# Patient Record
Sex: Female | Born: 1945 | Race: Black or African American | Hispanic: No | Marital: Married | Smoking: Never smoker
Health system: Southern US, Community
[De-identification: ages and names within clinical notes are randomized; demographics above are authoritative.]

## PROBLEM LIST (undated history)

## (undated) DIAGNOSIS — I2510 Atherosclerotic heart disease of native coronary artery without angina pectoris: Secondary | ICD-10-CM

## (undated) DIAGNOSIS — H3500 Unspecified background retinopathy: Secondary | ICD-10-CM

## (undated) DIAGNOSIS — K219 Gastro-esophageal reflux disease without esophagitis: Secondary | ICD-10-CM

## (undated) DIAGNOSIS — G629 Polyneuropathy, unspecified: Secondary | ICD-10-CM

## (undated) DIAGNOSIS — R490 Dysphonia: Secondary | ICD-10-CM

## (undated) DIAGNOSIS — D1803 Hemangioma of intra-abdominal structures: Secondary | ICD-10-CM

## (undated) DIAGNOSIS — M86171 Other acute osteomyelitis, right ankle and foot: Secondary | ICD-10-CM

## (undated) DIAGNOSIS — D649 Anemia, unspecified: Secondary | ICD-10-CM

## (undated) DIAGNOSIS — E119 Type 2 diabetes mellitus without complications: Secondary | ICD-10-CM

## (undated) DIAGNOSIS — N189 Chronic kidney disease, unspecified: Secondary | ICD-10-CM

## (undated) DIAGNOSIS — I6529 Occlusion and stenosis of unspecified carotid artery: Secondary | ICD-10-CM

## (undated) DIAGNOSIS — I209 Angina pectoris, unspecified: Secondary | ICD-10-CM

## (undated) DIAGNOSIS — I10 Essential (primary) hypertension: Secondary | ICD-10-CM

## (undated) DIAGNOSIS — E785 Hyperlipidemia, unspecified: Secondary | ICD-10-CM

## (undated) DIAGNOSIS — I639 Cerebral infarction, unspecified: Secondary | ICD-10-CM

## (undated) DIAGNOSIS — M1990 Unspecified osteoarthritis, unspecified site: Secondary | ICD-10-CM

## (undated) DIAGNOSIS — R634 Abnormal weight loss: Secondary | ICD-10-CM

## (undated) DIAGNOSIS — I509 Heart failure, unspecified: Secondary | ICD-10-CM

## (undated) DIAGNOSIS — I429 Cardiomyopathy, unspecified: Secondary | ICD-10-CM

## (undated) DIAGNOSIS — M51369 Other intervertebral disc degeneration, lumbar region without mention of lumbar back pain or lower extremity pain: Secondary | ICD-10-CM

## (undated) DIAGNOSIS — M5136 Other intervertebral disc degeneration, lumbar region: Secondary | ICD-10-CM

## (undated) DIAGNOSIS — H409 Unspecified glaucoma: Secondary | ICD-10-CM

## (undated) HISTORY — DX: Hyperlipidemia, unspecified: E78.5

## (undated) HISTORY — DX: Anemia, unspecified: D64.9

## (undated) HISTORY — PX: EYE SURGERY: SHX253

## (undated) HISTORY — PX: BRAIN SURGERY: SHX531

## (undated) HISTORY — PX: BILROTH II PROCEDURE: SHX1232

## (undated) HISTORY — PX: CARDIAC CATHETERIZATION: SHX172

## (undated) HISTORY — DX: Atherosclerotic heart disease of native coronary artery without angina pectoris: I25.10

## (undated) HISTORY — DX: Chronic kidney disease, unspecified: N18.9

## (undated) HISTORY — DX: Polyneuropathy, unspecified: G62.9

## (undated) HISTORY — DX: Heart failure, unspecified: I50.9

## (undated) HISTORY — DX: Unspecified glaucoma: H40.9

---

## 2007-09-15 ENCOUNTER — Other Ambulatory Visit: Payer: Self-pay

## 2007-09-15 ENCOUNTER — Inpatient Hospital Stay: Payer: Self-pay | Admitting: Internal Medicine

## 2007-11-13 ENCOUNTER — Emergency Department: Payer: Self-pay | Admitting: Internal Medicine

## 2007-12-13 ENCOUNTER — Ambulatory Visit: Payer: Self-pay | Admitting: Vascular Surgery

## 2008-04-13 ENCOUNTER — Emergency Department: Payer: Self-pay | Admitting: Emergency Medicine

## 2008-05-07 ENCOUNTER — Emergency Department: Payer: Self-pay | Admitting: Emergency Medicine

## 2008-07-30 ENCOUNTER — Ambulatory Visit: Payer: Self-pay | Admitting: Urology

## 2008-08-01 ENCOUNTER — Inpatient Hospital Stay: Payer: Self-pay | Admitting: Specialist

## 2008-10-03 ENCOUNTER — Ambulatory Visit: Payer: Self-pay | Admitting: Vascular Surgery

## 2009-04-06 ENCOUNTER — Inpatient Hospital Stay: Payer: Self-pay | Admitting: Internal Medicine

## 2009-09-17 ENCOUNTER — Encounter: Payer: Self-pay | Admitting: Internal Medicine

## 2009-09-17 ENCOUNTER — Ambulatory Visit: Payer: Self-pay

## 2010-02-04 DIAGNOSIS — I10 Essential (primary) hypertension: Secondary | ICD-10-CM | POA: Insufficient documentation

## 2010-02-04 DIAGNOSIS — E119 Type 2 diabetes mellitus without complications: Secondary | ICD-10-CM

## 2010-02-04 DIAGNOSIS — I2510 Atherosclerotic heart disease of native coronary artery without angina pectoris: Secondary | ICD-10-CM | POA: Insufficient documentation

## 2010-02-04 DIAGNOSIS — D539 Nutritional anemia, unspecified: Secondary | ICD-10-CM

## 2010-02-05 ENCOUNTER — Ambulatory Visit: Payer: Self-pay | Admitting: Internal Medicine

## 2010-02-05 DIAGNOSIS — R05 Cough: Secondary | ICD-10-CM

## 2010-02-05 DIAGNOSIS — J984 Other disorders of lung: Secondary | ICD-10-CM | POA: Insufficient documentation

## 2010-02-05 DIAGNOSIS — R0602 Shortness of breath: Secondary | ICD-10-CM | POA: Insufficient documentation

## 2010-03-10 ENCOUNTER — Telehealth: Payer: Self-pay | Admitting: Internal Medicine

## 2010-04-15 ENCOUNTER — Ambulatory Visit: Payer: Self-pay | Admitting: Internal Medicine

## 2010-04-29 ENCOUNTER — Telehealth: Payer: Self-pay | Admitting: Internal Medicine

## 2010-04-30 ENCOUNTER — Ambulatory Visit: Payer: Self-pay | Admitting: Cardiology

## 2010-05-14 ENCOUNTER — Emergency Department: Payer: Medicare Other | Admitting: Emergency Medicine

## 2010-05-16 ENCOUNTER — Encounter: Payer: Self-pay | Admitting: Internal Medicine

## 2010-05-30 ENCOUNTER — Other Ambulatory Visit: Payer: Self-pay | Admitting: Internal Medicine

## 2010-05-30 ENCOUNTER — Encounter: Payer: Self-pay | Admitting: Internal Medicine

## 2010-05-30 ENCOUNTER — Ambulatory Visit
Admission: RE | Admit: 2010-05-30 | Discharge: 2010-05-30 | Payer: Self-pay | Source: Home / Self Care | Attending: Internal Medicine | Admitting: Internal Medicine

## 2010-05-30 LAB — CBC WITH DIFFERENTIAL/PLATELET
Basophils Relative: 1.1 % (ref 0.0–3.0)
Eosinophils Relative: 6.5 % — ABNORMAL HIGH (ref 0.0–5.0)
HCT: 31.3 % — ABNORMAL LOW (ref 36.0–46.0)
Hemoglobin: 10.5 g/dL — ABNORMAL LOW (ref 12.0–15.0)
MCV: 82.7 fl (ref 78.0–100.0)
Monocytes Absolute: 0.3 10*3/uL (ref 0.1–1.0)
Neutro Abs: 3.2 10*3/uL (ref 1.4–7.7)
Neutrophils Relative %: 62.1 % (ref 43.0–77.0)
RBC: 3.79 Mil/uL — ABNORMAL LOW (ref 3.87–5.11)
WBC: 5.2 10*3/uL (ref 4.5–10.5)

## 2010-05-30 LAB — BASIC METABOLIC PANEL
BUN: 35 mg/dL — ABNORMAL HIGH (ref 6–23)
CO2: 26 mEq/L (ref 19–32)
Calcium: 8.6 mg/dL (ref 8.4–10.5)
Glucose, Bld: 170 mg/dL — ABNORMAL HIGH (ref 70–99)
Sodium: 144 mEq/L (ref 135–145)

## 2010-06-05 NOTE — Letter (Signed)
Summary: [facility]  [address]   [place], [place] [address]   Phone: [phone]  Fax: [phone]    05/16/2010  [name] [address] [place], [place]  [address]  Dear Ms. BEEL,   Please contact our office at [phone] to obtain your recent ct scan results. Thank you.        Sincerely,   [facility]

## 2010-06-05 NOTE — Progress Notes (Signed)
Summary: nos appt  Phone Note Call from Patient   Caller: [email or web address]  Call For: wert Summary of Call: LMTCB x2 to rsc nos from 11/4. Initial call taken by: Darletta Moll,  March 10, 2010 3:14 PM     Appended Document: nos appt will letter sent because her last cxr in [place] needs f/u - either here or there  Appended Document: nos appt Spoke with pt's sister-in-law [name] and advised that pt needs to call back.  She states that she is already aware that she needs appt and appt was resched for 03/26/10.

## 2010-06-05 NOTE — Assessment & Plan Note (Signed)
Summary: Pulmonary/ new pt cough eval ? all uacs ?   Visit Type:  Initial Consult Copy to:  Dr. Loma Sender Primary Kron Everton/Referring Danilyn Cocke:  Dr. Loma Sender  CC:  Dyspnea and Cough.  History of Present Illness: 23 yobf never smoker with h/o IHD s/p stent but no previous respiratory or allergy problems  February 05, 2010  1st pulmonary office eval cc indolent onset  x 9 months gradually worsening dry cough esp when lie down at night and also while trying to sleep and early in am assoc with sob with activty but thinks it is the cough that makes the breathing worse= if not coughing, not sob.   ventolin did not help, codeine did but it was the only remedy to date.  no h/o ace use that she can recall.  Pt denies any significant sore throat, dysphagia, itching, sneezing,  nasal congestion or excess secretions,  fever, chills, sweats, unintended wt loss, pleuritic or exertional cp, hempoptysis, change in activity tolerance  orthopnea pnd or leg swelling Pt also denies any obvious fluctuation in symptoms with weather or environmental change or other alleviating or aggravating factors.       Current Medications (verified): 1)  Furosemide 20 Mg Tabs (Furosemide) .Marland Kitchen.. 1 Two Times A Day 2)  Glimepiride 4 Mg Tabs (Glimepiride) .Marland Kitchen.. 1 Every Am 3)  Omeprazole 20 Mg Cpdr (Omeprazole) .Marland Kitchen.. 1 Two Times A Day 4)  Nifedipine 60 Mg Xr24h-Tab (Nifedipine) .Marland Kitchen.. 1 Once Daily 5)  Naproxen 375 Mg Tabs (Naproxen) .Marland Kitchen.. 1 After Breakfast and 1 After Dinner 6)  Ventolin Hfa 108 (90 Base) Mcg/act Aers (Albuterol Sulfate) .... 2 Puffs Every 6 Hrs As Needed  Allergies (verified): 1)  ! Codeine  Past History:  Past Medical History: IHD sp stent at [facility]............[facility]     - onset 06/2009  Past Surgical History: Current Problems:  DYSPNEA (ICD-786.05) ANEMIA, CHRONIC (ICD-281.9) CAD (ICD-414.00) DM (ICD-250.00) HYPERTENSION (ICD-401.9)  Family  History: Lung CA- Brother ( was a smoker) Brain CA- Brother  Social History: Married Children Works for [employer] Never smoker No ETOH  Review of Systems       The patient complains of shortness of breath with activity, shortness of breath at rest, productive cough, and ear ache.  The patient denies non-productive cough, coughing up blood, chest pain, irregular heartbeats, acid heartburn, indigestion, loss of appetite, weight change, abdominal pain, difficulty swallowing, sore throat, tooth/dental problems, headaches, nasal congestion/difficulty breathing through nose, sneezing, itching, anxiety, depression, hand/feet swelling, joint stiffness or pain, rash, change in color of mucus, and fever.    Vital Signs:  Patient profile:   65 year old female Height:      68 inches Weight:      218.38 pounds BMI:     33.32 O2 Sat:      97 % on Room air Temp:     97.8 degrees F oral Pulse rate:   77 / minute BP sitting:   124 / 64  (left arm) Cuff size:   large  Vitals Entered By: Vernie Murders (February 05, 2010 3:03 PM)  O2 Flow:  Room air  Physical Exam  Additional Exam:  amb obese bf nad wt 218 February 05, 2010 HEENT: nl dentition, turbinates, and orophanx. Nl external ear canals without cough reflex NECK :  without JVD/Nodes/TM/ nl carotid upstrokes bilaterally LUNGS: no acc muscle use, clear to A and P bilaterally without cough on insp or exp maneuvers CV:  RRR  no s3 or murmur or increase in P2, no edema  ABD:  soft and nontender with nl excursion in the supine position. No bruits or organomegaly, bowel sounds nl MS:  warm without deformities, calf tenderness, cyanosis or clubbing SKIN: warm and dry without lesions   NEURO:  alert, approp, no deficits     CXR  Procedure date:  09/17/2009  Findings:      Nodule L mid lung pa film only.  no acute changes o/w  Impression & Recommendations:  Problem # 1:  COUGH (ICD-786.2) The most common causes of chronic  cough in immunocompetent adults include: upper airway cough syndrome (UACS), previously referred to as postnasal drip syndrome,  caused by variety of rhinosinus conditions; (2) asthma; (3) GERD; (4) chronic bronchitis from cigarette smoking or other inhaled environmental irritants; (5) nonasthmatic eosinophilic bronchitis; and (6) bronchiectasis. These conditions, singly or in combination, have accounted for up to 94% of the causes of chronic cough in prospective studies.  this is most c/w  Classic Upper airway cough syndrome, so named because it's frequently impossible to sort out how much is  CR/sinusitis with freq throat clearing (which can be related to primary GERD)   vs  causing  secondary extra esophageal GERD from wide swings in gastric pressure that occur with throat clearing, promoting self use of mint and menthol lozenges that reduce the lower esophageal sphincter tone and exacerbate the problem further These are the same pts who not infrequently have failed to tolerate ace inhibitors,  dry powder inhalers or biphosphonates or report having reflux symptoms that don't respond to standard doses of PPI   Lack of response to ventolin noted.  therefore try max rx of Gerd x one mon  NB the  ramp to expected improvement (and for that matter, worsening, if a chronic effective medication is stopped)  can be measured in weeks, not days, a common misconception because this is not Heartburn with no immediate cause and effect relationship so that response to therapy or lack thereof can be very difficult to assess.   The standardized cough guidelines recently published in Chest are a 14 step process, not a single office visit,  and are intended  to address this problem logically,  with an alogrithm dependent on response to each progressive step  to determine a specific diagnosis with  minimal addtional testing needed. Therefore if compliance is an issue this empiric standardized approach simply won't work.    Problem # 2:  PULMONARY NODULE (ICD-518.89)  needs cxr next ov/ placed in tickle file  Orders: New Patient Level III ([address])  Medications Added to Medication List This Visit: 1)  Furosemide 20 Mg Tabs (Furosemide) .Marland Kitchen.. 1 two times a day 2)  Glimepiride 4 Mg Tabs (Glimepiride) .Marland Kitchen.. 1 every am 3)  Omeprazole 20 Mg Cpdr (Omeprazole) .Marland Kitchen.. 1 two times a day 4)  Omeprazole 20 Mg Cpdr (Omeprazole) .... Take one 30-60 min before first and last meals of the day 5)  Nifedipine 60 Mg Xr24h-tab (Nifedipine) .Marland Kitchen.. 1 once daily 6)  Naproxen 375 Mg Tabs (Naproxen) .Marland Kitchen.. 1 after breakfast and 1 after dinner 7)  Ventolin Hfa 108 (90 Base) Mcg/act Aers (Albuterol sulfate) .... 2 puffs every 6 hrs as needed 8)  Pepcid 20 Mg Tabs (Famotidine) .... Take one by mouth at bedtime 9)  Prednisone 10 Mg Tabs (Prednisone) .... 4 each am x 2days, 2x2days, 1x2days and stop  Patient Instructions: 1)  Stop naproxen and ventolin inhaler 2)  Change the  way you take omaprazole to 24m mg Take one 30-60 min before first and last meals of the day and PEPCID AC 20 mg one and bedtime 3)  Prednisone 10 mg 4 each am x 2days, 2x2days, 1x2days and stop  4)  GERD (REFLUX)  is a common cause of respiratory symptoms. It commonly presents without heartburn and can be treated with medication, but also with lifestyle changes including avoidance of late meals, excessive alcohol, smoking cessation, and avoid fatty foods, chocolate, peppermint, colas, red wine, and acidic juices such as orange juice. NO MINT OR MENTHOL PRODUCTS SO NO COUGH DROPS  5)  USE SUGARLESS CANDY INSTEAD (jolley ranchers)  6)  NO OIL BASED VITAMINS  7)  Please schedule a follow-up appointment in 4 weeks, sooner if needed  - needs cxr on return (placed in tickle file) Prescriptions: PREDNISONE 10 MG  TABS (PREDNISONE) 4 each am x 2days, 2x2days, 1x2days and stop  #14 x 0   Entered and Authorized by:   Nyoka Cowden MD   Signed by:   Nyoka Cowden MD on  02/05/2010   Method used:   Electronically to        [facility]* (retail)       [address]       [place], [place]  [address]       Ph: [phone]       Fax: [phone]   RxID:   [id number]

## 2010-06-05 NOTE — Assessment & Plan Note (Signed)
Summary: Pulmonary/ ext ov for cough. confused with instructions   Copy to:  Dr. Loma Sender Primary Provider/Referring Provider:  Dr. Loma Sender  CC:  Cough and dyspnea- slightly improved.  History of Present Illness: 55 yobf never smoker with h/o IHD s/p stent but no previous respiratory or allergy problems referred to pulmonary clinic by Dr Vear Clock to evaluate chronic cough.  February 05, 2010  1st pulmonary office eval cc indolent onset  x 9 months gradually worsening dry cough esp when lie down at night and also while trying to sleep and early in am assoc with sob with activty but thinks it is the cough that makes the breathing worse= if not coughing, not sob.   ventolin did not help, codeine did but it was the only remedy to date.  no h/o ace use that she can recall.  rec Stop naproxen and ventolin inhaler Change the way you take omaprazole to 52m mg Take one 30-60 min before first and last meals of the day and PEPCID AC 20 mg one and bedtime Prednisone 10 mg 4 each am x 2days, 2x2days, 1x2days and stop  GERD (REFLUX) duet   April 15, 2010 ov Cough and dyspnea- worse at night when lie down,  ? transient improvment on initial rx but but not much. rec stop nifedipine and labetolol stay on omeprazole Take one 30-60 min before first and last meals of the day and pepcid one at bedtime for now Add Chlortrimeton 4 mg one at bedtime Start bystolic 10 mg one daily ok to use twice daily if BP over 140/90 GERD (REFLUX)  diet rec sinus ct done 12/28 and neg  May 30, 2010 Cough and dyspnea- slightly improved.  med review indicates not using ppi as instructed.  Pt denies any significant sore throat, dysphagia, itching, sneezing,  nasal congestion or excess secretions,  fever, chills, sweats, unintended wt loss, pleuritic or exertional cp, hempoptysis, change in activity tolerance  orthopnea pnd or leg swelling. Pt also denies any obvious fluctuation in symptoms with weather or  environmental change or other alleviating or aggravating factors.       Current Medications (verified): 1)  Furosemide 20 Mg Tabs (Furosemide) .Marland Kitchen.. 1 Two Times A Day 2)  Glimepiride 4 Mg Tabs (Glimepiride) .Marland Kitchen.. 1 Every Am 3)  Omeprazole 20 Mg Cpdr (Omeprazole) .... Take One 30-60 Min Before First and Last Meals of The Day 4)  Pepcid 20 Mg Tabs (Famotidine) .... Take One By Mouth At Bedtime 5)  Hydralazine Hcl 100 Mg Tabs (Hydralazine Hcl) .Marland Kitchen.. 1 Four Times A Day 6)  Isosorbide Mononitrate Cr 60 Mg Xr24h-Tab (Isosorbide Mononitrate) .Marland Kitchen.. 1 Once Daily 7)  Bystolic 10 Mg  Tabs (Nebivolol Hcl) .... One Tablet Daily  Allergies (verified): 1)  ! Codeine  Past History:  Past Medical History: IHD sp stent at [facility]............................................[facility]     - onset 06/2009     - Sinus CT   April 30 2010 =  neg      - Allergy profile sent May 30, 2010  >>>  IGE  267   Vital Signs:  Patient profile:   65 year old female Weight:      220 pounds O2 Sat:      96 % on Room air Temp:     98.2 degrees F oral Pulse rate:   69 / minute BP sitting:   134 / 90  (left arm) Cuff size:   large  Vitals Entered By: Vernie Murders (  May 30, 2010 9:09 AM)  O2 Flow:  Room air  Physical Exam  Additional Exam:  amb obese bf nad wt 218 February 05, 2010 > 221 April 15, 2010 > 220 May 30, 2010  HEENT: nl dentition, turbinates, and orophanx. Nl external ear canals without cough reflex NECK :  without JVD/Nodes/TM/ nl carotid upstrokes bilaterally LUNGS: no acc muscle use, clear to A and P bilaterally without cough on insp or exp maneuvers CV:  RRR  no s3 or murmur or increase in P2, no edema  ABD:  soft and nontender with nl excursion in the supine position. No bruits or organomegaly, bowel sounds nl MS:  warm without deformities, calf tenderness, cyanosis or clubbing       Impression & Recommendations:  Problem # 1:  COUGH (ICD-786.2) The most common causes of  chronic cough in immunocompetent adults include: upper airway cough syndrome (UACS), previously referred to as postnasal drip syndrome,  caused by variety of rhinosinus conditions; (2) asthma; (3) GERD; (4) chronic bronchitis from cigarette smoking or other inhaled environmental irritants; (5) nonasthmatic eosinophilic bronchitis; and (6) bronchiectasis. These conditions, singly or in combination, have accounted for up to 94% of the causes of chronic cough in prospective studies.  Has not responded yet to rx directed at GERD and previously no response to saba  ruling against asthma.  Next step is push 1st gen H1 while assuring she's using ppi max effectively.  Not sure she gets the concept of accurate med reconciliation.  To keep things simple, I have asked the patient to first separate medicines that are perceived as maintenance, that is to be taken daily "no matter what", from those medicines that are taken on only on an as-needed basis and I have given the patient examples of both, and then return to see our NP to generate a  detailed  medication calendar which should be followed until the next physician sees the patient and updates it.   Once we're sure that we're all reading from the same page in terms of medication admiistration, she needs to be scheduled to follow up with me   Medications Added to Medication List This Visit: 1)  Hydralazine Hcl 100 Mg Tabs (Hydralazine hcl) .... One twice daily 2)  Pepcid 20 Mg Tabs (Famotidine) .... Take one by mouth at bedtime 3)  Chlor-trimeton 4 Mg Tabs (Chlorpheniramine maleate) .... One with supper and one  at bedtime 4)  Prednisone 10 Mg Tabs (Prednisone) .... 4 each am x 2days, 2x2days, 1x2days and stop  Other Orders: TLB-CBC Platelet - w/Differential (85025-CBCD) T-Allergy Profile Region II-DC, [place], [place], [place], [place] (5484) TLB-BNP (B-Natriuretic Peptide) (83880-BNPR) TLB-BMP (Basic Metabolic Panel-BMET) (80048-METABOL) Est. Patient Level IV  ([address]) Prescription Created Electronically [id number])  Patient Instructions: 1)  Take chlortrimeton 4 mg at supper and one bedtime 2)  Prednisone  4 each am x 2days, 2x2days, 1x2days and stop  3)  Take omeprazole Take one 30-60 min before first and last meals of the day  4)  See Tammy NP w/in 2 weeks with all your medications, even over the counter meds, separated in two separate bags, the ones you take no matter what vs the ones you stop once you feel better and take only as needed.  She will generate for you a new user friendly medication calendar that will put [place] all on the same page re: your medication use.  Prescriptions: PREDNISONE 10 MG  TABS (PREDNISONE) 4 each am x 2days, 2x2days, 1x2days and  stop  #14 x 0   Entered and Authorized by:   Nyoka Cowden MD   Signed by:   Nyoka Cowden MD on 05/30/2010   Method used:   Electronically to        [facility]* (retail)       [address]       [place], [place]  [address]       Ph: [phone]       Fax: [phone]   RxID:   [id number]

## 2010-06-05 NOTE — Assessment & Plan Note (Signed)
Summary: Pulmonary/ ext f/u ov  > try change labetolol  to bystolic   Copy to:  Dr. Loma Sender Primary [name]/Referring [name]:  Dr. Loma Sender  CC:  Cough and dyspnea- worse at night.  History of Present Illness: 65 yobf never smoker with h/o IHD s/p stent but no previous respiratory or allergy problems  February 05, 2010  1st pulmonary office eval cc indolent onset  x 9 months gradually worsening dry cough esp when lie down at night and also while trying to sleep and early in am assoc with sob with activty but thinks it is the cough that makes the breathing worse= if not coughing, not sob.   ventolin did not help, codeine did but it was the only remedy to date.  no h/o ace use that she can recall.  rec Stop naproxen and ventolin inhaler Change the way you take omaprazole to 37m mg Take one 30-60 min before first and last meals of the day and PEPCID AC 20 mg one and bedtime Prednisone 10 mg 4 each am x 2days, 2x2days, 1x2days and stop  GERD (REFLUX) duet   April 15, 2010 ov Cough and dyspnea- worse at night when lie down,  ? transient improvment on initial rx but but not much. Pt denies any significant sore throat, dysphagia, itching, sneezing,  nasal congestion or excess secretions,  fever, chills, sweats, unintended wt loss, pleuritic or exertional cp, hempoptysis, change in activity tolerance  orthopnea pnd or leg swelling Pt also denies any obvious fluctuation in symptoms with weather or environmental change or other alleviating or aggravating factors.       Current Medications (verified): 1)  Furosemide 20 Mg Tabs (Furosemide) .Marland Kitchen.. 1 Two Times A Day 2)  Glimepiride 4 Mg Tabs (Glimepiride) .Marland Kitchen.. 1 Every Am 3)  Omeprazole 20 Mg Cpdr (Omeprazole) .... Take One 30-60 Min Before First and Last Meals of The Day 4)  Nifedipine 60 Mg Xr24h-Tab (Nifedipine) .Marland Kitchen.. 1 Once Daily 5)  Naproxen 375 Mg Tabs (Naproxen) .Marland Kitchen.. 1 After Breakfast and 1 After Dinner 6)  Ventolin Hfa 108 (90  Base) Mcg/act Aers (Albuterol Sulfate) .... 2 Puffs Every 6 Hrs As Needed 7)  Pepcid 20 Mg Tabs (Famotidine) .... Take One By Mouth At Bedtime 8)  Hydralazine Hcl 100 Mg Tabs (Hydralazine Hcl) .Marland Kitchen.. 1 Four Times A Day 9)  Isosorbide Mononitrate Cr 60 Mg Xr24h-Tab (Isosorbide Mononitrate) .Marland Kitchen.. 1 Once Daily 10)  Labetalol Hcl 100 Mg Tabs (Labetalol Hcl) .Marland Kitchen.. 1 Two Times A Day  Allergies (verified): 1)  ! Codeine  Past History:  Past Medical History: IHD sp stent at [facility]............................................[facility]     - onset 66/2011     - Sinus CT ordered April 15, 2010   Vital Signs:  Patient profile:   65 year old female Weight:      221.13 pounds O2 Sat:      98 % on Room air Temp:     98.1 degrees F oral Pulse rate:   68 / minute BP sitting:   146 / 72  (left arm) Cuff size:   large  Vitals Entered By: Vernie Murders (April 15, 2010 10:57 AM)  O2 Flow:  Room air  Physical Exam  Additional Exam:  amb obese bf nad wt 218 February 05, 2010 > 221 April 15, 2010  HEENT: nl dentition, turbinates, and orophanx. Nl external ear canals without cough reflex NECK :  without JVD/Nodes/TM/ nl carotid upstrokes bilaterally LUNGS: no acc muscle  use, clear to A and P bilaterally without cough on insp or exp maneuvers CV:  RRR  no s3 or murmur or increase in P2, no edema  ABD:  soft and nontender with nl excursion in the supine position. No bruits or organomegaly, bowel sounds nl MS:  warm without deformities, calf tenderness, cyanosis or clubbing       CXR  Procedure date:  04/15/2010  Findings:         CHEST - 2 VIEW   Comparison: None.   Findings: There is mild enlargement of the cardiac silhouette.  The mediastinum and pulmonary vasculature are within normal limits. There is anterior eventration of the right hemi diaphragm.  Both lungs are clear.  No acute osseous abnormality noted.   IMPRESSION: Mild cardiomegaly.  Impression &  Recommendations:  Problem # 1:  COUGH (ICD-786.2) The most common causes of chronic cough in immunocompetent adults include: upper airway cough syndrome (UACS), previously referred to as postnasal drip syndrome,  caused by variety of rhinosinus conditions; (2) asthma; (3) GERD; (4) chronic bronchitis from cigarette smoking or other inhaled environmental irritants; (5) nonasthmatic eosinophilic bronchitis; and (6) bronchiectasis. These conditions, singly or in combination, have accounted for up to 94% of the causes of chronic cough in prospective studies.  Has not responded yet to rx directed at GERD and previously no response to saba but this was while on non-specific BB so try change this next while try to address sinus cause with sinus ct and trial of 1st gen H1 per guidelines.  Problem # 2:  PULMONARY NODULE (ICD-518.89)  No evidence of abn on present cxr, no further cxr's indicated here.  Orders: Est. Patient Level IV ([address])  Problem # 3:  HYPERTENSION (ICD-401.9)  The following medications were removed from the medication list:    Nifedipine 60 Mg Xr24h-tab (Nifedipine) .Marland Kitchen... 1 once daily    Labetalol Hcl 100 Mg Tabs (Labetalol hcl) .Marland Kitchen... 1 two times a day Her updated medication list for this problem includes:    Furosemide 20 Mg Tabs (Furosemide) .Marland Kitchen... 1 two times a day    Hydralazine Hcl 100 Mg Tabs (Hydralazine hcl) .Marland Kitchen... 1 four times a day    Bystolic 10 Mg Tabs (Nebivolol hcl) ..... One tablet daily  Orders: Est. Patient Level IV ([address])  Medications Added to Medication List This Visit: 1)  Hydralazine Hcl 100 Mg Tabs (Hydralazine hcl) .Marland Kitchen.. 1 four times a day 2)  Isosorbide Mononitrate Cr 60 Mg Xr24h-tab (Isosorbide mononitrate) .Marland Kitchen.. 1 once daily 3)  Labetalol Hcl 100 Mg Tabs (Labetalol hcl) .Marland Kitchen.. 1 two times a day 4)  Bystolic 10 Mg Tabs (Nebivolol hcl) .... One tablet daily  Other Orders: Misc. Referral (Misc. Ref) T-2 View CXR (71020TC)  Patient Instructions: 1)   See Patient Care Coordinator before leaving for sinus ct 2)  Cxr today and we'll call you the results 3)  stop nifedipine and labetolol 4)  stay on omeprazole Take one 30-60 min before first and last meals of the day and pepcid one at bedtime for now 5)  Add Chlortrimeton 4 mg one at bedtime 6)  Start bystolic 10 mg one daily ok to use twice daily if BP over 140/90 7)  GERD (REFLUX)  is a common cause of respiratory symptoms. It commonly presents without heartburn and can be treated with medication, but also with lifestyle changes including avoidance of late meals, excessive alcohol, smoking cessation, and avoid fatty foods, chocolate, peppermint, colas, red wine, and acidic juices  such as orange juice. NO MINT OR MENTHOL PRODUCTS SO NO COUGH DROPS  8)  USE SUGARLESS CANDY INSTEAD (jolley ranchers)  9)  NO OIL BASED VITAMINS  10)  Please schedule a follow-up appointment in 4  weeks, sooner if needed with all medications in hand

## 2010-06-05 NOTE — Progress Notes (Signed)
Summary: pt no show CT Sinus> rescheduled  Phone Note From Other Clinic   Caller: Charity with [facility] CT Dept Call For: Dr. Sherene Sires Summary of Call: ext [phone] Charity with [facility] CT Dept called and stated that pt did not show up for her CT Sinus scheduled for Thurs. 04/24/10. Just FYI ThanksAlfonso Ramus  April 29, 2010 11:47 AM  Initial call taken by: Alfonso Ramus,  April 29, 2010 11:47 AM

## 2010-06-13 ENCOUNTER — Encounter (INDEPENDENT_AMBULATORY_CARE_PROVIDER_SITE_OTHER): Payer: Medicare Other | Admitting: Adult Health

## 2010-06-13 ENCOUNTER — Encounter (INDEPENDENT_AMBULATORY_CARE_PROVIDER_SITE_OTHER): Payer: Self-pay | Admitting: *Deleted

## 2010-06-13 DIAGNOSIS — R05 Cough: Secondary | ICD-10-CM

## 2010-06-19 NOTE — Assessment & Plan Note (Signed)
Summary: med calendar - appt rsc to 2.17.12  Nurse Visit   Vital Signs:  Patient profile:   65 year old female Height:      68 inches Weight:      222.50 pounds O2 Sat:      98 % on Room air Temp:     98.1 degrees F oral Pulse rate:   64 / minute BP sitting:   160 / 70  (left arm) Cuff size:   large  Vitals Entered By: Abigail Miyamoto RN (June 13, 2010 9:22 AM)  O2 Flow:  Room air  CC:  new med calendar - pt did not bring any medications to the office visit today and has no new complaints and states breathing has improved since ov w/ MW.  appt rescheduled to 2.17.12 @ 0900.Marland Kitchen   Allergies (verified): 1)  ! Codeine  Comments:  Nurse/Medical Assistant: The patient's medications and allergies were reviewed with the patient and were updated in the Medication and Allergy Lists.   Immunization History:  Influenza Immunization History:    Influenza:  historical (05/02/2010)  Pneumovax Immunization History:    Pneumovax:  historical (06/22/2007)  Orders Added: 1)  No Charge Patient Arrived (NCPA0) [NCPA0]

## 2010-06-20 ENCOUNTER — Encounter: Payer: Self-pay | Admitting: Adult Health

## 2010-06-20 ENCOUNTER — Encounter (INDEPENDENT_AMBULATORY_CARE_PROVIDER_SITE_OTHER): Payer: Medicare Other | Admitting: Adult Health

## 2010-06-20 DIAGNOSIS — R05 Cough: Secondary | ICD-10-CM

## 2010-07-01 NOTE — Assessment & Plan Note (Signed)
Summary: NP follow up - med calendar  Nurse Visit   Vital Signs:  Patient profile:   65 year old female Height:      68 inches Weight:      221.38 pounds BMI:     33.78 O2 Sat:      99 % on Room air Temp:     97.4 degrees F oral Pulse rate:   62 / minute BP sitting:   162 / 66  (left arm) Cuff size:   large  Vitals Entered By: Boone Master CNA/MA (June 20, 2010 9:02 AM)  O2 Flow:  Room air  Patient Instructions: 1)  Stop Labetolol.  2)  Continue on Bystolic 10mg  once daily  3)  Stop fish oil and cinnamon. 4)  Take chlortrimeton 4 mg at supper and one bedtime 5)  Follow med calendar closely and bring to each visit. 6)  follow up Dr. Sherene Sires in 4 weeks  7)  Please contact office for sooner follow up if symptoms do not improve or worsen    Impression & Recommendations:  Problem # 1:  COUGH (ICD-786.2)  suspect is multifactoral with GERD,drainage  we have clarified all the recent recommendation for med changes and Meds reviewed with pt education and computerized med calendar completed Plan:   Stop Labetolol.  Continue on Bystolic 10mg  once daily  Stop fish oil and cinnamon. Take chlortrimeton 4 mg at supper and one bedtime Follow med calendar closely and bring to each visit. follow up Dr. Sherene Sires in 4 weeks  Please contact office for sooner follow up if symptoms do not improve or worsen   Orders: Est. Patient Level III ([address])  Complete Medication List: 1)  Furosemide 20 Mg Tabs (Furosemide) .Marland Kitchen.. 1 two times a day 2)  Glimepiride 4 Mg Tabs (Glimepiride) .Marland Kitchen.. 1 every am 3)  Omeprazole 20 Mg Cpdr (Omeprazole) .... Take one 30-60 min before first and last meals of the day 4)  Pepcid 20 Mg Tabs (Famotidine) .... Take one by mouth at bedtime 5)  Hydralazine Hcl 100 Mg Tabs (Hydralazine hcl) .... One twice daily 6)  Isosorbide Mononitrate Cr 60 Mg Xr24h-tab (Isosorbide mononitrate) .Marland Kitchen.. 1 once daily 7)  Bystolic 10 Mg Tabs (Nebivolol hcl) .... One tablet daily 8)   Chlor-trimeton 4 Mg Tabs (Chlorpheniramine maleate) .... One with supper and one  at bedtime   Copy to:  Dr. Loma Sender Primary Provider/Referring Provider:  Dr. Loma Sender  CC:  new med calendar - pt brought all meds with her today.  states dry cough is worse since last ov and esp at night.  reports has been taking chlor tabs 4mg  as recommended.  History of Present Illness: 37 yobf never smoker with h/o IHD s/p stent but no previous respiratory or allergy problems referred to pulmonary clinic by Dr Vear Clock to evaluate chronic cough.  February 05, 2010  1st pulmonary office eval cc indolent onset  x 9 months gradually worsening dry cough esp when lie down at night and also while trying to sleep and early in am assoc with sob with activty but thinks it is the cough that makes the breathing worse= if not coughing, not sob.   ventolin did not help, codeine did but it was the only remedy to date.  no h/o ace use that she can recall.  rec Stop naproxen and ventolin inhaler Change the way you take omaprazole to 59m mg Take one 30-60 min before first and last meals of the day and  PEPCID AC 20 mg one and bedtime Prednisone 10 mg 4 each am x 2days, 2x2days, 1x2days and stop  GERD (REFLUX) duet   April 15, 2010 ov Cough and dyspnea- worse at night when lie down,  ? transient improvment on initial rx but but not much. rec stop nifedipine and labetolol stay on omeprazole Take one 30-60 min before first and last meals of the day and pepcid one at bedtime for now Add Chlortrimeton 4 mg one at bedtime Start bystolic 10 mg one daily ok to use twice daily if BP over 140/90 GERD (REFLUX)  diet rec sinus ct done 12/28 and neg  May 30, 2010 Cough and dyspnea- slightly improved.  med review indicates not using ppi as instructed.   June 20, 2010 --Returns for follow up and med review.   We reviwed all her meds and orgainzed them into a med calendar. She has had several meds changes  recently and has confused the recommendation.She is taking both bystolic and labetolol.Also taking fish oil that was stopped. Last ov she was given steroid taper and chlor tabs for persistent cough. She has not seen much improvement .She still as dry cough.-worse at night..Denies chest pain,  orthopnea, hemoptysis, fever, n/v/d, edema, headache.   Past History:  Past Surgical History: Last updated: 02/05/2010 Current Problems:  DYSPNEA (ICD-786.05) ANEMIA, CHRONIC (ICD-281.9) CAD (ICD-414.00) DM (ICD-250.00) HYPERTENSION (ICD-401.9)  Family History: Last updated: 02/05/2010 Lung CA- Brother ( was a smoker) Brain CA- Brother  Social History: Last updated: 06/20/2010 Married Children Works for [employer] reports never smoked, but does admit to smoking one of her husband's cigarrettes on occasion in the past No ETOH  Risk Factors: Smoking Status: never (06/20/2010)  Past Medical History: IHD sp stent at [place] Cough............................................Marland KitchenWert     - onset 06/2009     - Sinus CT   April 30 2010 =  neg      - Allergy profile sent May 30, 2010  >>>  IGE  267  Complex med regimen--Meds reviewed with pt education and computerized med calendar completed2/17/12--and signed with copy scanned into system.   Review of Systems      See HPI   Physical Exam  Additional Exam:  amb obese bf nad wt 218 February 05, 2010 > 221 April 15, 2010 > 220 May 30, 2010 >>221 06/20/10  HEENT: nl dentition, turbinates, and orophanx. Nl external ear canals without cough reflex NECK :  without JVD/Nodes/TM/ nl carotid upstrokes bilaterally LUNGS: no acc muscle use, clear to A and P bilaterally without cough on insp or exp maneuvers CV:  RRR  no s3 or murmur or increase in P2, no edema  ABD:  soft and nontender with nl excursion in the supine position. No bruits or organomegaly, bowel sounds nl MS:  warm without deformities, calf  tenderness, cyanosis or clubbing      CC: new med calendar - pt brought all meds with her today.  states dry cough is worse since last ov, esp at night.  reports has been taking chlor tabs 4mg  as recommended Is Patient Diabetic? Yes Comments Medications reviewed with patient Daytime contact number verified with patient. Boone Master CNA/MA  June 20, 2010 9:02 AM    Preventive Screening-Counseling & Management  Alcohol-Tobacco     Smoking Status: never  Allergies: 1)  ! Codeine  Orders Added: 1)  Est. Patient Level III [[address]] Prescriptions: BYSTOLIC 10 MG  TABS (NEBIVOLOL HCL) One tablet  daily  #30 x 5   Entered and Authorized by:   Rubye Oaks NP   Signed by:   Tammy Parrett NP on 06/20/2010   Method used:   Electronically to        [facility]* (retail)       [address]       [place], [place]  [address]       Ph: [phone]       Fax: [phone]   RxID:   [id number]   Social History: Married Children Works for [employer] reports never smoked, but does admit to smoking one of her husband's cigarrettes on occasion in the past No ETOH Smoking Status:  never  Appended Document: med calendar update    Clinical Lists Changes  Medications: Changed medication from GLIMEPIRIDE 4 MG TABS (GLIMEPIRIDE) 1 every am to GLIMEPIRIDE 4 MG TABS (GLIMEPIRIDE) 1 tablet by mouth w/ breakfast Changed medication from HYDRALAZINE HCL 100 MG TABS (HYDRALAZINE HCL) one twice daily to HYDRALAZINE HCL 100 MG TABS (HYDRALAZINE HCL) 1 tablet in the morning, and 1 at bedtime Changed medication from BYSTOLIC 10 MG  TABS (NEBIVOLOL HCL) One tablet daily to BYSTOLIC 10 MG  TABS (NEBIVOLOL HCL) Take 1 tab by mouth at bedtime Added new medication of ASPIRIN 81 MG TBEC (ASPIRIN) Take 1 tablet by mouth once a day Added new medication of DELSYM 30 MG/5ML LQCR (DEXTROMETHORPHAN POLISTIREX) 2 teaspoons by mouth every 12 hours as needed

## 2010-07-01 NOTE — Medication Information (Signed)
Summary: Med Psychologist, sport and exercise  Med Calendar/[facility]   Imported By: Sherian Rein 06/27/2010 09:13:19  _____________________________________________________________________  External Attachment:    Type:   Image     Comment:   External Document

## 2010-07-18 ENCOUNTER — Ambulatory Visit (INDEPENDENT_AMBULATORY_CARE_PROVIDER_SITE_OTHER): Payer: Medicare Other | Admitting: Internal Medicine

## 2010-07-18 ENCOUNTER — Encounter: Payer: Self-pay | Admitting: Internal Medicine

## 2010-07-18 DIAGNOSIS — I10 Essential (primary) hypertension: Secondary | ICD-10-CM

## 2010-07-18 DIAGNOSIS — R05 Cough: Secondary | ICD-10-CM

## 2010-07-22 NOTE — Assessment & Plan Note (Signed)
Summary: Pulmonary/ f/u cough with elevated IgE, start singulair   Copy to:  Dr. Loma Sender Primary Provider/Referring Provider:  Dr. Loma Sender  CC:  4 wk follow up - cough unchanged.  Nonprod.Marland Kitchen  History of Present Illness: 13 yobf never smoker with h/o IHD s/p stent but no previous respiratory or allergy problems referred to pulmonary clinic by Dr Vear Clock to evaluate chronic cough.  February 05, 2010  1st pulmonary office eval cc indolent onset  x 9 months gradually worsening dry cough esp when lie down at night and also while trying to sleep and early in am assoc with sob with activty but thinks it is the cough that makes the breathing worse= if not coughing, not sob.   ventolin did not help, codeine did but it was the only remedy to date.  no h/o ace use that she can recall.  rec Stop naproxen and ventolin inhaler Change the way you take omaprazole to 93m mg Take one 30-60 min before first and last meals of the day and PEPCID AC 20 mg one and bedtime Prednisone 10 mg 4 each am x 2days, 2x2days, 1x2days and stop  GERD (REFLUX) duet   April 15, 2010 ov Cough and dyspnea- worse at night when lie down,  ? transient improvment on initial rx but but not much. rec stop nifedipine and labetolol stay on omeprazole Take one 30-60 min before first and last meals of the day and pepcid one at bedtime for now Add Chlortrimeton 4 mg one at bedtime Start bystolic 10 mg one daily ok to use twice daily if BP over 140/90 GERD (REFLUX)  diet rec sinus ct done 12/28 and neg   see page 2 June 20, 2010 --Returns for follow up and med review.   We reviwed all her meds and orgainzed them into a med calendar. She has had several meds changes recently and has confused the recommendation.She is taking both bystolic and labetolol.Also taking fish oil that was stopped. Last ov she was given steroid taper and chlor tabs for persistent cough. She has not seen much improvement .She still as dry  cough.-worse at night >  rec Stop Labetolol.  Continue on Bystolic 10mg  once daily  Stop fish oil and cinnamon. Take chlortrimeton 4 mg at supper and one bedtime Follow med calendar closely and bring to each visit.  July 18, 2010 ov cough is dry worse when lie down and no better x on codeine avg 3/7 nights symptomatic, not every night as previously reported.  no sob.  Pt denies any significant sore throat, dysphagia, itching, sneezing,  nasal congestion or excess secretions,  fever, chills, sweats, unintended wt loss, pleuritic or exertional cp, hempoptysis, change in activity tolerance  orthopnea pnd or leg swelling Pt also denies any obvious fluctuation in symptoms with weather or environmental change or other alleviating or aggravating factors.  Prednisone also helped alot but only transiently and doesn't want to take it.      Current Medications (verified): 1)  Glimepiride 4 Mg Tabs (Glimepiride) .Marland Kitchen.. 1 Tablet By Mouth W/ Breakfast 2)  Furosemide 20 Mg Tabs (Furosemide) .Marland Kitchen.. 1 Two Times A Day 3)  Isosorbide Mononitrate Cr 60 Mg Xr24h-Tab (Isosorbide Mononitrate) .Marland Kitchen.. 1 Once Daily 4)  Omeprazole 20 Mg Cpdr (Omeprazole) .... Take One 30-60 Min Before First and Last Meals of The Day 5)  Pepcid 20 Mg Tabs (Famotidine) .... Take One By Mouth At Bedtime 6)  Hydralazine Hcl 100 Mg Tabs (Hydralazine Hcl) .Marland KitchenMarland KitchenMarland Kitchen  1 Tablet in The Morning, and 1 At Bedtime 7)  Bystolic 10 Mg  Tabs (Nebivolol Hcl) .... Take 1 Tab By Mouth At Bedtime 8)  Chlor-Trimeton 4 Mg Tabs (Chlorpheniramine Maleate) .... One With Supper and One  At Bedtime 9)  Aspirin 81 Mg Tbec (Aspirin) .... Take 1 Tablet By Mouth Once A Day 10)  Delsym 30 Mg/62ml Lqcr (Dextromethorphan Polistirex) .... 2 Teaspoons By Mouth Every 12 Hours As Needed  Allergies (verified): 1)  ! Codeine  Past History:  Past Medical History: IHD sp stent at [place] Cough............................................Marland KitchenWert     - onset 06/2009     - Sinus CT    April 30 2010 =  neg      - Allergy profile sent May 30, 2010  >>>  IGE  267      - Singulair trial started July 18, 2010 >>>     - Methacholine Challenge ordered July 18, 2010 >>> Complex med regimen--Meds reviewed with pt education and computerized med calendar completed2/17/12--and signed with copy scanned into system.  Vital Signs:  Patient profile:   65 year old female Height:      68 inches Weight:      218.38 pounds BMI:     33.32 O2 Sat:      97 % on Room air Temp:     97.9 degrees F oral Pulse rate:   62 / minute BP sitting:   136 / 62  (left arm) Cuff size:   large  Vitals Entered By: Gweneth Dimitri RN (July 18, 2010 8:49 AM)  O2 Flow:  Room air CC: 4 wk follow up - cough unchanged.  Nonprod. Comments Medications reviewed with patient Daytime contact number verified with patient. Gweneth Dimitri RN  July 18, 2010 8:50 AM    Physical Exam  Additional Exam:  amb obese bf nad wt 218 February 05, 2010 > 221 April 15, 2010 > 220 May 30, 2010 >>221 06/20/10 > 218 July 18, 2010  HEENT: nl dentition, turbinates, and orophanx. Nl external ear canals without cough reflex NECK :  without JVD/Nodes/TM/ nl carotid upstrokes bilaterally LUNGS: no acc muscle use, clear to A and P bilaterally without cough on insp or exp maneuvers CV:  RRR  no s3 or murmur or increase in P2, no edema  ABD:  soft and nontender with nl excursion in the supine position. No bruits or organomegaly, bowel sounds nl MS:  warm without deformities, calf tenderness, cyanosis or clubbing       Impression & Recommendations:  Problem # 1:  COUGH (ICD-786.2)  The most common causes of chronic cough in immunocompetent adults include: upper airway cough syndrome (UACS), previously referred to as postnasal drip syndrome,  caused by variety of rhinosinus conditions; (2) asthma; (3) GERD; (4) chronic bronchitis from cigarette smoking or other inhaled environmental irritants; (5) nonasthmatic  eosinophilic bronchitis; and (6) bronchiectasis. These conditions, singly or in combination, have accounted for up to 94% of the causes of chronic cough in prospective studies.  Response to predisone and IgE elevation favor allergic mech and / or asthma.  Try singulair next and ordered Methacholine challenge to sort out.  Problem # 2:  HYPERTENSION (ICD-401.9)  Her updated medication list for this problem includes:    Furosemide 20 Mg Tabs (Furosemide) .Marland Kitchen... 1 two times a day    Hydralazine Hcl 100 Mg Tabs (Hydralazine hcl) .Marland Kitchen... 1 tablet in the morning, and 1 at bedtime    Bystolic 10 Mg Tabs (  Nebivolol hcl) .Marland Kitchen... Take 1 tab by mouth at bedtime  ok off non-specific BB - if MCT pos would avoid anything here but Bystolic, the most beta -1  selective Beta blocker available in sample form, with bisoprolol the most selective generic choice  on the market.    Each maintenance medication was reviewed in detail including most importantly the difference between maintenance and as needed and under what circumstances the prns are to be used.  This was done in the context of a medication calendar review which provided the patient with a user-friendly unambiguous mechanism for medication administration and reconciliation and provides an action plan for all active problems. It is critical that this be shown to every doctor  for modification during the office visit if necessary so the patient can use it as a working document.   Medications Added to Medication List This Visit: 1)  Singulair 10 Mg Tabs (Montelukast sodium) .... One by mouth daily 2)  Tramadol Hcl 50 Mg Tabs (Tramadol hcl) .... One to two by mouth every 4-6 hours  Other Orders: Misc. Referral (Misc. Ref) Est. Patient Level IV ([address]) Prescription Created Electronically [phone])  Patient Instructions: 1)  Singulair 10 mg one each pm 2)  for cough use Tramadol 50 mg every 4 hours  3)  See Patient Care Coordinator before leaving for  methacholine challenge test to be done as soon as  possible  4)      Clinical Reports Reviewed:  CXR:  04/15/2010: CXR Results:    CHEST - 2 VIEW  Comparison: None.  Findings: There is mild enlargement of the cardiac silhouette.  The mediastinum and pulmonary vasculature are within normal limits. There is anterior eventration of the right hemi diaphragm.  Both lungs are clear.  No acute osseous abnormality noted.  IMPRESSION: Mild cardiomegaly.  09/17/2009: CXR Results:  Nodule L mid lung pa film only.  no acute changes o/w  Prescriptions: TRAMADOL HCL 50 MG  TABS (TRAMADOL HCL) One to two by mouth every 4-6 hours  #40 x 0   Entered and Authorized by:   Nyoka Cowden MD   Signed by:   Nyoka Cowden MD on 07/18/2010   Method used:   Electronically to        [facility]* (retail)       [address]       [place], [place]  [address]       Ph: [phone]       Fax: [phone]   RxID:   [id number] SINGULAIR 10 MG  TABS (MONTELUKAST SODIUM) One by mouth daily  #34 x 11   Entered and Authorized by:   Nyoka Cowden MD   Signed by:   Nyoka Cowden MD on 07/18/2010   Method used:   Print then Give to Patient   RxID:   [id number]

## 2010-07-29 ENCOUNTER — Ambulatory Visit (HOSPITAL_COMMUNITY)
Admission: RE | Admit: 2010-07-29 | Discharge: 2010-07-29 | Disposition: A | Discharge status: Home / Self Care | Payer: Medicare Other | Source: Ambulatory Visit | Attending: Internal Medicine | Admitting: Internal Medicine

## 2010-07-29 DIAGNOSIS — R05 Cough: Secondary | ICD-10-CM | POA: Insufficient documentation

## 2010-07-29 DIAGNOSIS — R059 Cough, unspecified: Secondary | ICD-10-CM | POA: Insufficient documentation

## 2010-07-29 LAB — PULMONARY FUNCTION TEST

## 2010-08-05 ENCOUNTER — Telehealth: Payer: Self-pay | Admitting: *Deleted

## 2010-08-05 ENCOUNTER — Encounter: Payer: Self-pay | Admitting: Internal Medicine

## 2010-08-05 NOTE — Telephone Encounter (Signed)
Spoke with pt and notified of the results of her MCT.  Pt verbalized understanding.

## 2010-08-05 NOTE — Telephone Encounter (Signed)
Per Dr. Sherene Sires, MCT was negative for asthma.  LMTCB.

## 2010-08-07 ENCOUNTER — Encounter: Payer: Self-pay | Admitting: Internal Medicine

## 2010-08-22 ENCOUNTER — Telehealth: Payer: Self-pay | Admitting: *Deleted

## 2010-08-22 NOTE — Telephone Encounter (Signed)
PA was filled out and signed and was faxed back to [facility].

## 2010-08-22 NOTE — Telephone Encounter (Signed)
PA initiated for Singulair with [employer], [phone]. Member ID # [id number]. Awaiting fax.

## 2010-08-22 NOTE — Telephone Encounter (Signed)
Form received and placed in Dr. Thurston Hole look at. Will forward this to Verlon Au so she is aware.

## 2010-09-01 ENCOUNTER — Telehealth: Payer: Self-pay | Admitting: *Deleted

## 2010-09-01 NOTE — Telephone Encounter (Addendum)
PA initiated for Bystolic through [employer] at [phone]. Member ID # [id number]. Ref # [id number]. Awaiting fax.  Form received and given to Boone Master for TP.

## 2010-09-05 MED ORDER — BISOPROLOL FUMARATE 10 MG PO TABS: 10.0000 mg | ORAL_TABLET | Freq: Every day | ORAL | Status: DC

## 2010-09-05 NOTE — Telephone Encounter (Signed)
May use bisoprolol 10mg  daily #30, prn refills

## 2010-09-05 NOTE — Telephone Encounter (Signed)
New rx called to [facility].

## 2010-09-05 NOTE — Telephone Encounter (Signed)
PA signed by TP and faxed back to [facility].  Will await approval / denial.

## 2010-09-05 NOTE — Telephone Encounter (Signed)
PA for bystolic filled out.  Will have TP sign.

## 2010-09-05 NOTE — Telephone Encounter (Signed)
Received PA denial for pt's bystolic 10mg .  [facility], spoke with pharmacy rep Lillia Abed for alternative medications:  Atenolol 100mg , 50mg , 25mg  Betaxolol 10mg , 20mg  Bisoprolol 5mg  Metoprolol succ 200mg , 100mg , 50mg , 25mg  Metoprolol tart 100mg , 50mg , 25mg .    TP, may patient be switched to one of these alternatives?

## 2010-10-22 ENCOUNTER — Telehealth: Payer: Self-pay | Admitting: Adult Health

## 2010-10-22 NOTE — Telephone Encounter (Signed)
Med is : bisoprolol.

## 2010-10-22 NOTE — Telephone Encounter (Signed)
Per pt chart, #30 tabs of the bisoprolol with 11 refills was sent to pharmacy.  Called spoke with pharm tech, verified this over the phone.  Nothing else needed.  Will sign off on message.

## 2010-10-30 ENCOUNTER — Other Ambulatory Visit: Payer: Self-pay | Admitting: *Deleted

## 2010-10-30 MED ORDER — TRAMADOL HCL 50 MG PO TABS: ORAL_TABLET | ORAL | Status: AC

## 2011-04-20 ENCOUNTER — Ambulatory Visit: Payer: Medicare Other

## 2012-03-26 ENCOUNTER — Emergency Department: Payer: Self-pay | Admitting: Emergency Medicine

## 2012-03-26 LAB — COMPREHENSIVE METABOLIC PANEL
Albumin: 3.2 g/dL — ABNORMAL LOW (ref 3.4–5.0)
Anion Gap: 5 — ABNORMAL LOW (ref 7–16)
BUN: 22 mg/dL — ABNORMAL HIGH (ref 7–18)
Bilirubin,Total: 0.3 mg/dL (ref 0.2–1.0)
Calcium, Total: 8.6 mg/dL (ref 8.5–10.1)
Creatinine: 1.52 mg/dL — ABNORMAL HIGH (ref 0.60–1.30)
EGFR (Non-African Amer.): 35 — ABNORMAL LOW
Glucose: 204 mg/dL — ABNORMAL HIGH (ref 65–99)
Osmolality: 294 (ref 275–301)
Potassium: 3.9 mmol/L (ref 3.5–5.1)
SGOT(AST): 20 U/L (ref 15–37)
Sodium: 143 mmol/L (ref 136–145)
Total Protein: 6.3 g/dL — ABNORMAL LOW (ref 6.4–8.2)

## 2012-03-26 LAB — URINALYSIS, COMPLETE
Blood: NEGATIVE
Glucose,UR: 50 mg/dL (ref 0–75)
Ketone: NEGATIVE
Nitrite: NEGATIVE
Ph: 6 (ref 4.5–8.0)
Protein: 100
RBC,UR: 1 /HPF (ref 0–5)
Specific Gravity: 1.013 (ref 1.003–1.030)
WBC UR: 23 /HPF (ref 0–5)

## 2012-03-26 LAB — CBC
HGB: 11.3 g/dL — ABNORMAL LOW (ref 12.0–16.0)
Platelet: 193 10*3/uL (ref 150–440)
RDW: 16 % — ABNORMAL HIGH (ref 11.5–14.5)
WBC: 6 10*3/uL (ref 3.6–11.0)

## 2012-03-26 LAB — LIPASE, BLOOD: Lipase: 84 U/L (ref 73–393)

## 2012-08-09 ENCOUNTER — Inpatient Hospital Stay: Payer: Self-pay | Admitting: Internal Medicine

## 2012-08-09 LAB — COMPREHENSIVE METABOLIC PANEL
Albumin: 3.1 g/dL — ABNORMAL LOW (ref 3.4–5.0)
Alkaline Phosphatase: 116 U/L (ref 50–136)
Bilirubin,Total: 0.2 mg/dL (ref 0.2–1.0)
Calcium, Total: 8.1 mg/dL — ABNORMAL LOW (ref 8.5–10.1)
Chloride: 108 mmol/L — ABNORMAL HIGH (ref 98–107)
Glucose: 426 mg/dL — ABNORMAL HIGH (ref 65–99)
Osmolality: 301 (ref 275–301)
Potassium: 3.7 mmol/L (ref 3.5–5.1)
SGPT (ALT): 18 U/L (ref 12–78)

## 2012-08-09 LAB — CK TOTAL AND CKMB (NOT AT ARMC): CK, Total: 272 U/L — ABNORMAL HIGH (ref 21–215)

## 2012-08-09 LAB — CBC
HCT: 31.2 % — ABNORMAL LOW (ref 35.0–47.0)
HGB: 10.4 g/dL — ABNORMAL LOW (ref 12.0–16.0)
MCHC: 33.3 g/dL (ref 32.0–36.0)
RBC: 3.88 10*6/uL (ref 3.80–5.20)

## 2012-08-09 LAB — URINALYSIS, COMPLETE
Bacteria: NONE SEEN
Glucose,UR: 150 mg/dL (ref 0–75)
Hyaline Cast: 45
Ketone: NEGATIVE
Protein: 100
RBC,UR: 4 /HPF (ref 0–5)

## 2012-08-09 LAB — APTT: Activated PTT: 37 secs — ABNORMAL HIGH (ref 23.6–35.9)

## 2012-08-09 LAB — TROPONIN I: Troponin-I: 0.16 ng/mL — ABNORMAL HIGH

## 2012-08-10 LAB — BASIC METABOLIC PANEL
Anion Gap: 7 (ref 7–16)
BUN: 33 mg/dL — ABNORMAL HIGH (ref 7–18)
Chloride: 119 mmol/L — ABNORMAL HIGH (ref 98–107)
Co2: 22 mmol/L (ref 21–32)
EGFR (Non-African Amer.): 32 — ABNORMAL LOW
Potassium: 3.2 mmol/L — ABNORMAL LOW (ref 3.5–5.1)
Sodium: 148 mmol/L — ABNORMAL HIGH (ref 136–145)

## 2012-08-10 LAB — TROPONIN I
Troponin-I: 0.17 ng/mL — ABNORMAL HIGH
Troponin-I: 0.17 ng/mL — ABNORMAL HIGH

## 2012-08-10 LAB — APTT: Activated PTT: 152.7 secs — ABNORMAL HIGH (ref 23.6–35.9)

## 2012-08-10 LAB — CBC WITH DIFFERENTIAL/PLATELET
Basophil %: 0.9 %
Eosinophil #: 0.5 10*3/uL (ref 0.0–0.7)
Eosinophil %: 6.4 %
HCT: 29 % — ABNORMAL LOW (ref 35.0–47.0)
Lymphocyte #: 2.8 10*3/uL (ref 1.0–3.6)
Lymphocyte %: 39.8 %
MCHC: 33.8 g/dL (ref 32.0–36.0)
Monocyte #: 0.5 x10 3/mm (ref 0.2–0.9)
Monocyte %: 7.5 %
Platelet: 190 10*3/uL (ref 150–440)
WBC: 7.2 10*3/uL (ref 3.6–11.0)

## 2012-08-10 LAB — LIPID PANEL
Ldl Cholesterol, Calc: 83 mg/dL (ref 0–100)
Triglycerides: 40 mg/dL (ref 0–200)
VLDL Cholesterol, Calc: 8 mg/dL (ref 5–40)

## 2012-08-10 LAB — PROTEIN / CREATININE RATIO, URINE: Creatinine, Urine: 93 mg/dL (ref 30.0–125.0)

## 2012-08-10 LAB — MAGNESIUM: Magnesium: 2.3 mg/dL

## 2012-08-10 LAB — CK TOTAL AND CKMB (NOT AT ARMC)
CK, Total: 225 U/L — ABNORMAL HIGH (ref 21–215)
CK-MB: 1.7 ng/mL (ref 0.5–3.6)

## 2012-08-11 LAB — URINE CULTURE

## 2012-12-29 ENCOUNTER — Ambulatory Visit: Payer: Self-pay | Admitting: Gastroenterology

## 2013-02-08 ENCOUNTER — Emergency Department: Payer: Self-pay | Admitting: Emergency Medicine

## 2013-02-08 LAB — COMPREHENSIVE METABOLIC PANEL
Albumin: 3.6 g/dL (ref 3.4–5.0)
Alkaline Phosphatase: 119 U/L (ref 50–136)
Anion Gap: 9 (ref 7–16)
BUN: 23 mg/dL — ABNORMAL HIGH (ref 7–18)
Bilirubin,Total: 0.4 mg/dL (ref 0.2–1.0)
Calcium, Total: 8.8 mg/dL (ref 8.5–10.1)
Chloride: 108 mmol/L — ABNORMAL HIGH (ref 98–107)
Co2: 23 mmol/L (ref 21–32)
EGFR (African American): 29 — ABNORMAL LOW
EGFR (Non-African Amer.): 25 — ABNORMAL LOW
Glucose: 190 mg/dL — ABNORMAL HIGH (ref 65–99)
Osmolality: 288 (ref 275–301)
Potassium: 3.7 mmol/L (ref 3.5–5.1)
SGPT (ALT): 18 U/L (ref 12–78)
Total Protein: 6.9 g/dL (ref 6.4–8.2)

## 2013-02-08 LAB — CBC
HGB: 8.4 g/dL — ABNORMAL LOW (ref 12.0–16.0)
MCH: 24.5 pg — ABNORMAL LOW (ref 26.0–34.0)
MCV: 74 fL — ABNORMAL LOW (ref 80–100)
Platelet: 251 10*3/uL (ref 150–440)
RBC: 3.44 10*6/uL — ABNORMAL LOW (ref 3.80–5.20)
RDW: 15.5 % — ABNORMAL HIGH (ref 11.5–14.5)

## 2013-02-09 ENCOUNTER — Ambulatory Visit: Payer: Self-pay | Admitting: Gastroenterology

## 2013-02-09 LAB — URINALYSIS, COMPLETE
Bilirubin,UR: NEGATIVE
Blood: NEGATIVE
Hyaline Cast: 33
Nitrite: NEGATIVE
Protein: 30
RBC,UR: 2 /HPF (ref 0–5)
WBC UR: 13 /HPF (ref 0–5)

## 2013-03-03 ENCOUNTER — Ambulatory Visit: Payer: Self-pay | Admitting: Hematology and Oncology

## 2013-03-03 LAB — CBC CANCER CENTER
Eosinophil #: 0.4 x10 3/mm (ref 0.0–0.7)
HGB: 8.2 g/dL — ABNORMAL LOW (ref 12.0–16.0)
Lymphocyte #: 1.8 x10 3/mm (ref 1.0–3.6)
Lymphocyte %: 28 %
MCH: 23.4 pg — ABNORMAL LOW (ref 26.0–34.0)
MCV: 73 fL — ABNORMAL LOW (ref 80–100)
Monocyte #: 0.6 x10 3/mm (ref 0.2–0.9)
Monocyte %: 9.2 %
RBC: 3.5 10*6/uL — ABNORMAL LOW (ref 3.80–5.20)
RDW: 19.6 % — ABNORMAL HIGH (ref 11.5–14.5)

## 2013-03-03 LAB — IRON AND TIBC: Iron Saturation: 15 %

## 2013-03-03 LAB — FERRITIN: Ferritin (ARMC): 14 ng/mL (ref 8–388)

## 2013-03-04 ENCOUNTER — Ambulatory Visit: Payer: Self-pay | Admitting: Hematology and Oncology

## 2013-03-29 LAB — CBC CANCER CENTER
Basophil %: 1.1 %
HCT: 31.2 % — ABNORMAL LOW (ref 35.0–47.0)
HGB: 10 g/dL — ABNORMAL LOW (ref 12.0–16.0)
Lymphocyte #: 1.9 x10 3/mm (ref 1.0–3.6)
Lymphocyte %: 28.7 %
MCV: 78 fL — ABNORMAL LOW (ref 80–100)
Monocyte #: 0.4 x10 3/mm (ref 0.2–0.9)
Monocyte %: 5.6 %
Platelet: 206 x10 3/mm (ref 150–440)
RBC: 4.02 10*6/uL (ref 3.80–5.20)
WBC: 6.5 x10 3/mm (ref 3.6–11.0)

## 2013-03-29 LAB — IRON AND TIBC
Iron Saturation: 56 %
Iron: 99 ug/dL (ref 50–170)

## 2013-03-29 LAB — FERRITIN: Ferritin (ARMC): 652 ng/mL — ABNORMAL HIGH (ref 8–388)

## 2013-04-03 ENCOUNTER — Ambulatory Visit: Payer: Self-pay | Admitting: Hematology and Oncology

## 2013-05-19 ENCOUNTER — Emergency Department: Payer: Self-pay | Admitting: Emergency Medicine

## 2013-05-19 ENCOUNTER — Observation Stay: Payer: Self-pay | Admitting: Internal Medicine

## 2013-05-19 LAB — BASIC METABOLIC PANEL
ANION GAP: 5 — AB (ref 7–16)
Anion Gap: 9 (ref 7–16)
BUN: 23 mg/dL — ABNORMAL HIGH (ref 7–18)
BUN: 23 mg/dL — AB (ref 7–18)
CALCIUM: 8.7 mg/dL (ref 8.5–10.1)
CHLORIDE: 99 mmol/L (ref 98–107)
CREATININE: 1.61 mg/dL — AB (ref 0.60–1.30)
Calcium, Total: 8.9 mg/dL (ref 8.5–10.1)
Chloride: 103 mmol/L (ref 98–107)
Co2: 31 mmol/L (ref 21–32)
Co2: 26 mmol/L (ref 21–32)
Creatinine: 1.58 mg/dL — ABNORMAL HIGH (ref 0.60–1.30)
EGFR (African American): 39 — ABNORMAL LOW
EGFR (African American): 38 — ABNORMAL LOW
EGFR (Non-African Amer.): 33 — ABNORMAL LOW
GFR CALC NON AF AMER: 33 — AB
GLUCOSE: 389 mg/dL — AB (ref 65–99)
Glucose: 393 mg/dL — ABNORMAL HIGH (ref 65–99)
OSMOLALITY: 290 (ref 275–301)
Osmolality: 296 (ref 275–301)
POTASSIUM: 3 mmol/L — AB (ref 3.5–5.1)
Potassium: 3.1 mmol/L — ABNORMAL LOW (ref 3.5–5.1)
Sodium: 135 mmol/L — ABNORMAL LOW (ref 136–145)
Sodium: 138 mmol/L (ref 136–145)

## 2013-05-19 LAB — CBC
HCT: 34.7 % — ABNORMAL LOW (ref 35.0–47.0)
HGB: 11.4 g/dL — ABNORMAL LOW (ref 12.0–16.0)
MCH: 26.3 pg (ref 26.0–34.0)
MCHC: 32.7 g/dL (ref 32.0–36.0)
MCV: 80 fL (ref 80–100)
Platelet: 212 10*3/uL (ref 150–440)
RBC: 4.33 10*6/uL (ref 3.80–5.20)
RDW: 14.5 % (ref 11.5–14.5)
WBC: 11.3 10*3/uL — ABNORMAL HIGH (ref 3.6–11.0)

## 2013-05-19 LAB — MAGNESIUM: Magnesium: 2 mg/dL

## 2013-05-19 LAB — TROPONIN I: Troponin-I: 0.24 ng/mL — ABNORMAL HIGH

## 2013-05-20 LAB — URINALYSIS, COMPLETE
BACTERIA: NONE SEEN
BILIRUBIN, UR: NEGATIVE
Blood: NEGATIVE
Glucose,UR: >=500 mg/dL (ref 0–75)
Ketone: NEGATIVE
NITRITE: NEGATIVE
Ph: 5 (ref 4.5–8.0)
Protein: 100
RBC,UR: 6 /HPF (ref 0–5)
SPECIFIC GRAVITY: 1.013 (ref 1.003–1.030)

## 2013-05-20 LAB — CBC WITH DIFFERENTIAL/PLATELET
Basophil #: 0.1 10*3/uL (ref 0.0–0.1)
Basophil %: 0.7 %
Eosinophil #: 0 10*3/uL (ref 0.0–0.7)
Eosinophil %: 0.1 %
HCT: 29.6 % — ABNORMAL LOW (ref 35.0–47.0)
HGB: 9.9 g/dL — AB (ref 12.0–16.0)
Lymphocyte #: 1.3 10*3/uL (ref 1.0–3.6)
Lymphocyte %: 11.4 %
MCH: 26.6 pg (ref 26.0–34.0)
MCHC: 33.4 g/dL (ref 32.0–36.0)
MCV: 80 fL (ref 80–100)
Monocyte #: 0.4 x10 3/mm (ref 0.2–0.9)
Monocyte %: 3.9 %
NEUTROS ABS: 9.5 10*3/uL — AB (ref 1.4–6.5)
Neutrophil %: 83.9 %
Platelet: 184 10*3/uL (ref 150–440)
RBC: 3.71 10*6/uL — ABNORMAL LOW (ref 3.80–5.20)
RDW: 14.7 % — ABNORMAL HIGH (ref 11.5–14.5)
WBC: 11.4 10*3/uL — AB (ref 3.6–11.0)

## 2013-05-20 LAB — BASIC METABOLIC PANEL
Anion Gap: 7 (ref 7–16)
BUN: 26 mg/dL — ABNORMAL HIGH (ref 7–18)
CHLORIDE: 108 mmol/L — AB (ref 98–107)
Calcium, Total: 8.6 mg/dL (ref 8.5–10.1)
Co2: 26 mmol/L (ref 21–32)
Creatinine: 1.63 mg/dL — ABNORMAL HIGH (ref 0.60–1.30)
EGFR (African American): 37 — ABNORMAL LOW
EGFR (Non-African Amer.): 32 — ABNORMAL LOW
Glucose: 321 mg/dL — ABNORMAL HIGH (ref 65–99)
Osmolality: 298 (ref 275–301)
Potassium: 3.5 mmol/L (ref 3.5–5.1)
Sodium: 141 mmol/L (ref 136–145)

## 2013-05-20 LAB — LIPID PANEL
CHOLESTEROL: 127 mg/dL (ref 0–200)
HDL Cholesterol: 67 mg/dL — ABNORMAL HIGH (ref 40–60)
Ldl Cholesterol, Calc: 47 mg/dL (ref 0–100)
TRIGLYCERIDES: 67 mg/dL (ref 0–200)
VLDL Cholesterol, Calc: 13 mg/dL (ref 5–40)

## 2013-05-20 LAB — HEMOGLOBIN A1C: Hemoglobin A1C: 10 % — ABNORMAL HIGH (ref 4.2–6.3)

## 2013-05-20 LAB — TROPONIN I
Troponin-I: 0.25 ng/mL — ABNORMAL HIGH
Troponin-I: 0.25 ng/mL — ABNORMAL HIGH

## 2013-05-20 LAB — MAGNESIUM: Magnesium: 2 mg/dL

## 2013-05-20 LAB — CK-MB
CK-MB: 1.3 ng/mL (ref 0.5–3.6)
CK-MB: 1 ng/mL (ref 0.5–3.6)

## 2013-05-21 LAB — BASIC METABOLIC PANEL
ANION GAP: 8 (ref 7–16)
BUN: 28 mg/dL — ABNORMAL HIGH (ref 7–18)
CALCIUM: 8.3 mg/dL — AB (ref 8.5–10.1)
CO2: 23 mmol/L (ref 21–32)
Chloride: 109 mmol/L — ABNORMAL HIGH (ref 98–107)
Creatinine: 1.75 mg/dL — ABNORMAL HIGH (ref 0.60–1.30)
EGFR (African American): 34 — ABNORMAL LOW
EGFR (Non-African Amer.): 29 — ABNORMAL LOW
GLUCOSE: 170 mg/dL — AB (ref 65–99)
Osmolality: 289 (ref 275–301)
POTASSIUM: 3.2 mmol/L — AB (ref 3.5–5.1)
SODIUM: 140 mmol/L (ref 136–145)

## 2013-05-21 LAB — CBC WITH DIFFERENTIAL/PLATELET
Basophil #: 0.1 10*3/uL (ref 0.0–0.1)
Basophil %: 0.8 %
EOS ABS: 0.6 10*3/uL (ref 0.0–0.7)
Eosinophil %: 7.1 %
HCT: 29.3 % — ABNORMAL LOW (ref 35.0–47.0)
HGB: 9.8 g/dL — AB (ref 12.0–16.0)
Lymphocyte #: 2.2 10*3/uL (ref 1.0–3.6)
Lymphocyte %: 26.9 %
MCH: 26.9 pg (ref 26.0–34.0)
MCHC: 33.3 g/dL (ref 32.0–36.0)
MCV: 81 fL (ref 80–100)
MONOS PCT: 7.4 %
Monocyte #: 0.6 x10 3/mm (ref 0.2–0.9)
Neutrophil #: 4.7 10*3/uL (ref 1.4–6.5)
Neutrophil %: 57.8 %
PLATELETS: 176 10*3/uL (ref 150–440)
RBC: 3.63 10*6/uL — AB (ref 3.80–5.20)
RDW: 14.8 % — ABNORMAL HIGH (ref 11.5–14.5)
WBC: 8.1 10*3/uL (ref 3.6–11.0)

## 2013-05-21 LAB — MAGNESIUM: MAGNESIUM: 2.2 mg/dL

## 2013-05-22 LAB — BASIC METABOLIC PANEL
Anion Gap: 8 (ref 7–16)
BUN: 30 mg/dL — AB (ref 7–18)
Calcium, Total: 8.5 mg/dL (ref 8.5–10.1)
Chloride: 108 mmol/L — ABNORMAL HIGH (ref 98–107)
Co2: 23 mmol/L (ref 21–32)
Creatinine: 1.8 mg/dL — ABNORMAL HIGH (ref 0.60–1.30)
EGFR (African American): 33 — ABNORMAL LOW
GFR CALC NON AF AMER: 28 — AB
GLUCOSE: 145 mg/dL — AB (ref 65–99)
Osmolality: 286 (ref 275–301)
Potassium: 3.4 mmol/L — ABNORMAL LOW (ref 3.5–5.1)
SODIUM: 139 mmol/L (ref 136–145)

## 2013-09-01 ENCOUNTER — Ambulatory Visit: Payer: Self-pay | Admitting: Hematology and Oncology

## 2014-01-28 ENCOUNTER — Emergency Department: Payer: Self-pay | Admitting: Emergency Medicine

## 2014-02-01 ENCOUNTER — Encounter: Payer: Self-pay | Admitting: Podiatry

## 2014-02-01 ENCOUNTER — Ambulatory Visit (INDEPENDENT_AMBULATORY_CARE_PROVIDER_SITE_OTHER): Payer: Medicare Other

## 2014-02-01 ENCOUNTER — Ambulatory Visit (INDEPENDENT_AMBULATORY_CARE_PROVIDER_SITE_OTHER): Payer: Medicare Other | Admitting: Podiatry

## 2014-02-01 VITALS — BP 148/81 | HR 65 | Resp 16 | Ht 67.0 in | Wt 171.0 lb

## 2014-02-01 DIAGNOSIS — M76822 Posterior tibial tendinitis, left leg: Secondary | ICD-10-CM

## 2014-02-01 DIAGNOSIS — M205X2 Other deformities of toe(s) (acquired), left foot: Secondary | ICD-10-CM

## 2014-02-01 DIAGNOSIS — M2141 Flat foot [pes planus] (acquired), right foot: Secondary | ICD-10-CM

## 2014-02-01 DIAGNOSIS — M779 Enthesopathy, unspecified: Secondary | ICD-10-CM

## 2014-02-01 DIAGNOSIS — M2142 Flat foot [pes planus] (acquired), left foot: Secondary | ICD-10-CM

## 2014-02-01 NOTE — Patient Instructions (Signed)
Cryotherapy Cryotherapy is when you put ice on your injury. Ice helps lessen pain and puffiness (swelling) after an injury. Ice works the best when you start using it in the first 24 to 48 hours after an injury. HOME CARE  Put a dry or damp towel between the ice pack and your skin.  You may press gently on the ice pack.  Leave the ice on for no more than 10 to 20 minutes at a time.  Check your skin after 5 minutes to make sure your skin is okay.  Rest at least 20 minutes between ice pack uses.  Stop using ice when your skin loses feeling (numbness).  Do not use ice on someone who cannot tell you when it hurts. This includes small children and people with memory problems (dementia). GET HELP RIGHT AWAY IF:  You have white spots on your skin.  Your skin turns blue or pale.  Your skin feels waxy or hard.  Your puffiness gets worse. MAKE SURE YOU:   Understand these instructions.  Will watch your condition.  Will get help right away if you are not doing well or get worse. Document Released: 10/07/2007 Document Revised: 07/13/2011 Document Reviewed: 12/11/2010 Exit[employer] Patient Information 2015 [employer]. This information is not intended to replace advice given to you by your health care provider. Make sure you discuss any questions you have with your health care provider.  

## 2014-02-01 NOTE — Progress Notes (Signed)
   Subjective:    Patient ID: [name], female    DOB: [birth year], 68 y.o.   MRN: [id number]  HPI Comments: [name], 68 year old female, percent office with complaints of left foot pain which has been ongoing for approximately 3 weeks. She states the pain has been progressive. She says that her pain is mostly in the morning or after periods of standing. When asked with her pain is located she points to the medial aspect of her foot mostly over the first MTPJ and navicular tuberosity. She denies any history of injury or trauma to the area. She's been applying alcohol to the foot. Denies any systemic complaints as fevers, chills, nausea, vomiting. No other complaints at this time.  Foot Pain Associated symptoms include coughing.      Review of Systems  Respiratory: Positive for cough.   Musculoskeletal:       Joint pain Back pain  All other systems reviewed and are negative.      Objective:   Physical Exam AAO x3, NAD DP/PT pulses palpable 2/4 b/l. CRT < 3 sec. Protective sensation intact with Derrel Nip monofilament, vibratory sensation intact, Achilles tendon reflex intact. Tenderness to palpation over the first MTPJ with prominent dorsal exostosis and decreased range of motion in dorsiflexion. No overlying skin lesions/warmth/erythema. Mild tenderness over the navicular tuberosity. Upon palpation of the course of posterior tibial tendon she currently denies any pain but subjectively states as were her pain is localized to. Mild decrease in medial arch height upon weightbearing. Able to perform heel rise without difficulty. No pinpoint bony tenderness or pain with vibratory sensation. MMT 5/5, ROM WNL No open lesions. No calf pain, swelling, warmth.      Assessment & Plan:  68 year old female with likely posterior tibial tendinitis; right hallux limitus. -X-rays were obtained and reviewed with the patient. -Conservative versus surgical treatment were discussed  including alternatives, risks, complications. -At this time the patient elects to proceed with an injection into the first MTPJ on the left foot. Under sterile skin preparation with Betadine a total of 1 cc mixture of dexamethasone and 0.5% Marcaine plain was infiltrated into the first MTPJ. Patient tolerated the injection well without complications. -Ice to the effected area. -Discussed the importance of proper shoe gear and orthotics to help control her flatfoot and to take pressure off of the posterior tibial tendon and first MTPJ. -Followup in 3 weeks or sooner if any problems are to arise or any changes symptoms. In the meantime call any questions, concerns, changes symptoms. Followup with PCP for other issues mentioned in the review of systems.

## 2014-02-22 ENCOUNTER — Ambulatory Visit (INDEPENDENT_AMBULATORY_CARE_PROVIDER_SITE_OTHER): Payer: Medicare Other | Admitting: Podiatry

## 2014-02-22 VITALS — BP 161/79 | HR 71 | Resp 16

## 2014-02-22 DIAGNOSIS — M205X2 Other deformities of toe(s) (acquired), left foot: Secondary | ICD-10-CM

## 2014-02-22 DIAGNOSIS — M792 Neuralgia and neuritis, unspecified: Secondary | ICD-10-CM

## 2014-02-22 DIAGNOSIS — M2142 Flat foot [pes planus] (acquired), left foot: Secondary | ICD-10-CM

## 2014-02-22 DIAGNOSIS — M2141 Flat foot [pes planus] (acquired), right foot: Secondary | ICD-10-CM

## 2014-02-22 NOTE — Progress Notes (Signed)
Patient ID: [name], female   DOB: [birth year], 68 y.o.   MRN: [id number]  Subjective: Patient returns the office they for followup evaluation of left foot pain. She previously had an injection into the first MTPJ which did not provide her with much relief. She states that she continues to have pain over the area for which she points to a bump on the dorsal aspect of the first metatarsal head. She states that she has pain extending up the foot for which she points along the medial aspect of the foot. She states that he feels a sharp pain. She states the pain is intermittent it is only symptomatic after prolonged periods of standing or activity. She has no acute injury or trauma to the area. No changes since last appointment. Denies any systemic complaints as fevers, chills, nausea, vomiting. No other complaints at this time.  Objective: AAO x3, NAD DP/PT pulses palpable bilaterally, CRT less than 3 seconds Protective sensation intact with Simms Weinstein monofilament, vibratory sensation intact, Achilles tendon reflex intact Mild tenderness to palpation over the dorsal aspect of the right first MTPJ with prominent dorsal exostosis and there is a decrease in range of motion in dorsi flexion of the first MTPJ. There is no overlying edema, warmth, erythema. There is no tenderness over the navicular tuberosity or along the course of posterior tibial tendon. There is a decrease in medial arch height upon weightbearing. No pain with vibratory sensation.  Bilateral HAV; hammertoe deformities. MMT 5/5, ROM WNL No open lesions or pre-ulcerative lesions No calf pain, swelling, warmth  Assessment: 68 year old female with right foot hallux limitus with prominent dorsal exostosis, likely neuritis  Plan: -Conservative versus surgical treatment discussed including alternatives, risks, complications. -At this time patient did not have any relief of symptoms from prior injection therefore we'll not do a  further injection. -Recommended supportive shoe gear and inserts to help support her foot type. Prescription was given to the patient to go to [facility] to have inserts made. -Followup once the inserts are made. In the meantime call the office with any questions, concerns, changes symptoms.

## 2014-03-28 ENCOUNTER — Emergency Department: Payer: Self-pay | Admitting: Emergency Medicine

## 2014-03-28 LAB — URINALYSIS, COMPLETE
BLOOD: NEGATIVE
Bacteria: NONE SEEN
Bilirubin,UR: NEGATIVE
GLUCOSE, UR: NEGATIVE mg/dL (ref 0–75)
Hyaline Cast: 3
Ketone: NEGATIVE
Nitrite: NEGATIVE
PH: 6 (ref 4.5–8.0)
Protein: 100
Specific Gravity: 1.018 (ref 1.003–1.030)
WBC UR: 2 /HPF (ref 0–5)

## 2014-04-16 ENCOUNTER — Ambulatory Visit: Payer: Self-pay

## 2014-04-23 ENCOUNTER — Ambulatory Visit: Payer: Self-pay

## 2014-04-25 ENCOUNTER — Ambulatory Visit: Payer: Self-pay

## 2014-05-04 HISTORY — PX: CORONARY ANGIOPLASTY: SHX604

## 2014-05-05 ENCOUNTER — Emergency Department: Payer: Self-pay | Admitting: Emergency Medicine

## 2014-05-05 LAB — URINALYSIS, COMPLETE
BACTERIA: NONE SEEN
Bilirubin,UR: NEGATIVE
Blood: NEGATIVE
Glucose,UR: NEGATIVE mg/dL (ref 0–75)
Hyaline Cast: 2
KETONE: NEGATIVE
Nitrite: NEGATIVE
Ph: 5 (ref 4.5–8.0)
RBC,UR: 2 /HPF (ref 0–5)
SPECIFIC GRAVITY: 1.021 (ref 1.003–1.030)
Squamous Epithelial: <1
WBC UR: 1 /HPF (ref 0–5)

## 2014-05-05 LAB — BASIC METABOLIC PANEL
ANION GAP: 7 (ref 7–16)
BUN: 26 mg/dL — AB (ref 7–18)
CALCIUM: 7.7 mg/dL — AB (ref 8.5–10.1)
Chloride: 118 mmol/L — ABNORMAL HIGH (ref 98–107)
Co2: 21 mmol/L (ref 21–32)
Creatinine: 2.02 mg/dL — ABNORMAL HIGH (ref 0.60–1.30)
GFR CALC AF AMER: 31 — AB
GFR CALC NON AF AMER: 26 — AB
GLUCOSE: 52 mg/dL — AB (ref 65–99)
Osmolality: 293 (ref 275–301)
POTASSIUM: 3.4 mmol/L — AB (ref 3.5–5.1)
Sodium: 146 mmol/L — ABNORMAL HIGH (ref 136–145)

## 2014-05-05 LAB — CBC WITH DIFFERENTIAL/PLATELET
BASOS ABS: 0 10*3/uL (ref 0.0–0.1)
Basophil %: 0.9 %
Eosinophil #: 0.2 10*3/uL (ref 0.0–0.7)
Eosinophil %: 6 %
HCT: 33.9 % — ABNORMAL LOW (ref 35.0–47.0)
HGB: 10.9 g/dL — AB (ref 12.0–16.0)
LYMPHS ABS: 0.9 10*3/uL — AB (ref 1.0–3.6)
Lymphocyte %: 20.9 %
MCH: 28.3 pg (ref 26.0–34.0)
MCHC: 32 g/dL (ref 32.0–36.0)
MCV: 88 fL (ref 80–100)
Monocyte #: 0.2 x10 3/mm (ref 0.2–0.9)
Monocyte %: 5.6 %
Neutrophil #: 2.8 10*3/uL (ref 1.4–6.5)
Neutrophil %: 66.6 %
PLATELETS: 219 10*3/uL (ref 150–440)
RBC: 3.84 10*6/uL (ref 3.80–5.20)
RDW: 17.6 % — AB (ref 11.5–14.5)
WBC: 4.2 10*3/uL (ref 3.6–11.0)

## 2014-05-05 LAB — TROPONIN I: TROPONIN-I: 0.14 ng/mL — AB

## 2014-06-11 ENCOUNTER — Encounter: Payer: Self-pay | Admitting: Unknown Physician Specialty

## 2014-06-25 ENCOUNTER — Emergency Department: Payer: Self-pay | Admitting: Emergency Medicine

## 2014-07-05 ENCOUNTER — Ambulatory Visit: Payer: Medicare HMO | Attending: Otolaryngology

## 2014-07-05 DIAGNOSIS — R490 Dysphonia: Secondary | ICD-10-CM | POA: Diagnosis present

## 2014-07-05 NOTE — Therapy (Signed)
[facility] [address] [place], [place], [address] Phone: [phone]   Fax:  [phone]  Speech Language Pathology Evaluation  Patient Details  Name: [name] MRN: [id number] Date of Birth: April 13, 1946 Referring Provider:  Izora Gala, MD  Encounter Date: 07/05/2014      End of Session - 07/05/14 1527    Visit Number 1   Number of Visits 8   Date for SLP Re-Evaluation 09/03/14   SLP Start Time 1450   SLP Stop Time  1525   SLP Time Calculation (min) 35 min   Activity Tolerance Patient tolerated treatment well      No past medical history on file.  No past surgical history on file.  There were no vitals taken for this visit.  Visit Diagnosis: Dysphonia      Subjective Assessment - 07/05/14 1505    Symptoms Pt reports being cold in ST office. 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 Pt has notified her PCP of this. Mod hoarse and strained voice.          SLP Evaluation [facility] - 07/05/14 1507    SLP Visit Information   Onset Date November 2015   Medical Diagnosis Dysphonia   General Information   HPI Pt with strained hoarse voice since November 2015, when she woke up with pain in rt butocks and back when she had to begin walking with a walker. Dr. Constance Holster unable to find anything remarkable upon exam. ? 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 some sort of spasmodic dysphonia. Arrives here for eval for voice.   Oral Motor/Sensory Function   Labial Coordination Reduced   Lingual ROM Within Functional Limits   Lingual Coordination Reduced   Velum Within Functional Limits   Overall Oral Motor/Sensory Function Pt with decr'd labial and lingual coordination with alternating motion tasks - hesitations and halting of this musculature   Motor Speech   Overall Motor Speech Appears within functional limits for tasks  assessed   Phonation Hoarse;Other (comment)  strained voice   Articulation Within functional limitis   Intelligibility Intelligible   Motor Planning Witnin functional limits   Motor Speech Errors Not applicable   Phonation Impaired   Tension Present --  SLP hears what appears to sound like vocal tension     SLP assessed the following voice measurements from the pt: sustained /a/= 19 seconds (WNL) with vocal fry noted, s/z ratio= 1.42 (measurements >/= 1.4 are indicative of vocal disorders manifesting themselves in voice).  Perceptually, pt's voice is described as "tight" and "squeaky". This persisted in 90-95% of the evaluation at all linguistic levels assessed (word, phrase, sentence, conversation).  After eval tasks, SLP engaged in diagnostic therapy and attempted vocal fold relaxation techniques including humming softly, quiet vocalization, throat clear shaped to /m/, low to high and low to high to low pitch modulation, and extended/drawn out throat clear. Quiet vocalization was the sole technique which was minimally successful at reducing symptoms of vocal tightness and straining. Pt was 30% successful with WFL voicing with quiet vocalization with /a/ and /ma/. Approximately 15 minutes was spent with these tasks.             SLP Long Term Goals - 07/05/14 1532    SLP LONG TERM GOAL #1   Title pt will produce vowels with WNL vocal quality 80% success   Time 4   Period Weeks  Status New   SLP LONG TERM GOAL #2   Title pt will produce words with initial long vowel sounds with WNL vocal quality with 80% success   Time 4   Period Weeks   Status New   SLP LONG TERM GOAL #3   Title pt will demo sentence responses with WNL vocal quality 80% success   Time 4   Period Weeks   Status New   SLP LONG TERM GOAL #4   Title pt will demo simple conversation of 5 minutes with WNL vocal quality 80% of the time.   Time 4   Period Weeks   Status New   SLP LONG TERM GOAL #5   Title pt  will complete HEP for vocal relaxation with rare min A   Time 4   Period Weeks   Status New   Additional Long Term Goals   Additional Long Term Goals Yes   SLP LONG TERM GOAL #6   Title pt will tell SLP three aspects of vocal hygiene with rare min A   Time 4   Period Weeks   Status New          Plan - July 30, 2014 1527    Clinical Impression Statement Pt presents with strained/strangled vocal quality, not amenable to vocal relaxation techniques by SLP today during evaluation. SLP questions functional voice disorder vs. some other vocal pathology unable to be visualized during ENT exam.    Speech Therapy Frequency 2x / week   Duration 4 weeks   Treatment/Interventions Compensatory techniques;Functional tasks;Patient/family education;SLP instruction and feedback   Potential to Achieve Goals Good   Potential Considerations Severity of impairments          G-Codes - Jul 30, 2014 1534    Functional Assessment Tool Used noms   Functional Limitations Voice   Voice Current Status (G9171) At least 60 percent but less than 80 percent impaired, limited or restricted   Voice Goal Status (M2263) At least 1 percent but less than 20 percent impaired, limited or restricted      Problem List Patient Active Problem List   Diagnosis Date Noted  . PULMONARY NODULE 02/05/2010  . DYSPNEA 02/05/2010  . COUGH 02/05/2010  . DM 02/04/2010  . ANEMIA, CHRONIC 02/04/2010  . HYPERTENSION 02/04/2010  . CAD 02/04/2010    [facility] , SLP  2014/07/30, 5:11 PM  [facility] [address] [place], [place], [address] Phone: [phone]   Fax:  [phone]

## 2014-07-12 ENCOUNTER — Ambulatory Visit: Payer: Medicare HMO

## 2014-07-12 DIAGNOSIS — R490 Dysphonia: Secondary | ICD-10-CM | POA: Diagnosis not present

## 2014-07-12 NOTE — Therapy (Signed)
[facility] [address] [place], [place], [address] Phone: [phone]   Fax:  [phone]  Speech Language Pathology Treatment  Patient Details  Name: [name] MRN: [id number] Date of Birth: Aug 22, 1945 Referring Provider:  Morton Peters.*  Encounter Date: 07/12/2014    No past medical history on file.  No past surgical history on file.  There were no vitals filed for this visit.  Visit Diagnosis: Dysphonia             ADULT SLP TREATMENT - 07/12/14 1539    General Information   Behavior/Cognition Alert;Cooperative;Pleasant mood   Treatment Provided   Treatment provided Cognitive-Linquistic   Pain Assessment   Pain Assessment No/denies pain   Cognitive-Linquistic Treatment   Treatment focused on Voice   Skilled Treatment SLP facilitated entire session with vocal relaxation/normalization techniques including pitch rise/fall, yawn, singing, throat clearing, all with /u/, /o/, /i/, /m/+previous vowels and /j/ + previous vowels. Pt successful in WNL voicing with pitches higher than WNL. SLP minimally successful with words in higher than WNL speaking voice, although by end of session pt's speaking voice lower and more phonic than at beginning of session.   Assessment / Recommendations / Plan   Plan Continue with current plan of care   Progression Toward Goals   Progression toward goals Progressing toward goals              SLP Long Term Goals - 07/12/14 1704    SLP LONG TERM GOAL #1   Title pt will produce vowels with WNL vocal quality 80% success   Time 4   Period Weeks   Status On-going   SLP LONG TERM GOAL #2   Title pt will produce words with initial long vowel sounds with WNL vocal quality with 80% success   Time 4   Period Weeks   Status On-going   SLP LONG TERM GOAL #3   Title pt will demo sentence responses with WNL vocal quality 80% success   Time 4   Period Weeks   Status On-going   SLP LONG TERM GOAL #4   Title pt will demo simple conversation of 5 minutes with WNL vocal quality 80% of the time.   Time 4   Period Weeks   Status On-going   SLP LONG TERM GOAL #5   Title pt will complete HEP for vocal relaxation with rare min A   Time 4   Period Weeks   Status On-going   SLP LONG TERM GOAL #6   Title pt will tell SLP three aspects of vocal hygiene with rare min A   Time 4   Period Weeks   Status New          Plan - 07/12/14 1703    Clinical Impression Statement Pt presents with strained/strangled vocal quality, somewhat amenable to vocal relaxation and voice normalizing techniques by SLP today. Cont skilled ST needed to address pt's true voice and improve pt's QOL.   Speech Therapy Frequency 2x / week   Duration 4 weeks   Treatment/Interventions Compensatory techniques;Functional tasks;Patient/family education;SLP instruction and feedback   Potential to Achieve Goals Good   Potential Considerations Severity of impairments        Problem List Patient Active Problem List   Diagnosis Date Noted  . PULMONARY NODULE 02/05/2010  . DYSPNEA 02/05/2010  . COUGH 02/05/2010  . DM 02/04/2010  . ANEMIA, CHRONIC 02/04/2010  . HYPERTENSION 02/04/2010  . CAD 02/04/2010  [place] 07/12/2014, 5:05 PM  [facility] [address] [place], [place], [address] Phone: [phone]   Fax:  [phone]

## 2014-07-12 NOTE — Patient Instructions (Signed)
Practice for 15 minutes, twice a day ooo-ooo, ohhh-ohh, eee-eee, you-you, moo-moo, mee-mee, in your real voice If you feel or hear your rough voice, stop and start over

## 2014-07-17 ENCOUNTER — Ambulatory Visit: Payer: Medicare HMO

## 2014-07-17 DIAGNOSIS — R490 Dysphonia: Secondary | ICD-10-CM

## 2014-07-17 NOTE — Patient Instructions (Addendum)
Practice 15 minutes twice a day:  You-no-you-no you  I-no I-no me  You-no-you  I-no-me  You-no you-no me  I-no I-no you

## 2014-07-17 NOTE — Therapy (Signed)
[facility] [address] [place], [place], [address] Phone: [phone]   Fax:  [phone]  Speech Language Pathology Treatment  Patient Details  Name: [name] MRN: [id number] Date of Birth: 22-Apr-1946 Referring Provider:  Morton Peters.*  Encounter Date: 07/17/2014      End of Session - 07/17/14 0851    Visit Number 3   Number of Visits 8   Date for SLP Re-Evaluation 09/03/14   SLP Start Time 0803   SLP Stop Time  0845   SLP Time Calculation (min) 42 min   Activity Tolerance Patient tolerated treatment well      No past medical history on file.  No past surgical history on file.  There were no vitals filed for this visit.  Visit Diagnosis: Dysphonia      Subjective Assessment - 07/17/14 0809    Symptoms Pt arrives with same sounding hig pitched hoarse voice as last session however when completing exercises she practiced at home pt's voice became less hoarse, and more like a WNL pitch.               ADULT SLP TREATMENT - 07/17/14 0811    General Information   Behavior/Cognition Alert;Cooperative;Pleasant mood   Treatment Provided   Treatment provided Cognitive-Linquistic   Pain Assessment   Pain Assessment 0-10   Pain Score 1    Pain Location neck (rear)   Pain Descriptors / Indicators Sore   Cognitive-Linquistic Treatment   Treatment focused on Voice   Skilled Treatment SLP utilized vocal normalizing techniques (imitation of SLP singing using nasal consonants with long lip rounding vowels, soft gentle alveolars and long lip rounded vowels) with singing with WNL voice. As soon as pt was led into speech intonation instead of singing, voice immediately changed to high pitched and mod hoarse.   Assessment / Recommendations / Plan   Plan Continue with current plan of care   Progression Toward Goals   Progression toward goals Progressing toward goals              SLP  Long Term Goals - 07/17/14 1410    SLP LONG TERM GOAL #1   Title pt will produce vowels with WNL vocal quality 80% success   Time 3   Period Weeks   Status On-going   SLP LONG TERM GOAL #2   Title pt will produce words with initial long vowel sounds with WNL vocal quality with 80% success   Time 3   Period Weeks   Status On-going   SLP LONG TERM GOAL #3   Title pt will demo sentence responses with WNL vocal quality 80% success   Time 3   Period Weeks   Status On-going   SLP LONG TERM GOAL #4   Title pt will demo simple conversation of 5 minutes with WNL vocal quality 80% of the time.   Time 3   Period Weeks   Status On-going   SLP LONG TERM GOAL #5   Title pt will complete HEP for vocal relaxation with rare min A   Time 4   Period Weeks   Status On-going   SLP LONG TERM GOAL #6   Title pt will tell SLP three aspects of vocal hygiene with rare min A   Time 4   Period Weeks   Status New          Plan - 07/17/14 3013    Clinical Impression Statement Pt presents with  strained/strangled vocal quality, somewhat amenable to vocal relaxation and voice normalizing techniques by SLP today. Cont skilled ST needed to address pt's true voice and improve pt's QOL.   Speech Therapy Frequency 2x / week   Duration --  3 weeks   Treatment/Interventions Compensatory techniques;Functional tasks;Patient/family education;SLP instruction and feedback   Potential to Achieve Goals Good   Potential Considerations Severity of impairments        Problem List Patient Active Problem List   Diagnosis Date Noted  . PULMONARY NODULE 02/05/2010  . DYSPNEA 02/05/2010  . COUGH 02/05/2010  . DM 02/04/2010  . ANEMIA, CHRONIC 02/04/2010  . HYPERTENSION 02/04/2010  . CAD 02/04/2010    [facility], SLP 07/17/2014, 8:53 AM  [facility] [address] [place], [place], [address] Phone: [phone]   Fax:  [phone]

## 2014-07-19 ENCOUNTER — Ambulatory Visit: Payer: Medicare HMO

## 2014-07-19 DIAGNOSIS — R490 Dysphonia: Secondary | ICD-10-CM

## 2014-07-19 NOTE — Patient Instructions (Addendum)
  Practice these with your normal, good, loose voice 20 minutes, twice a day   You know you know you   I no I know me   You no you and I know me   I know new moons   You know you know me   I know I know you   You know new me   I know a new moon   Do I know new me    You know new me   I know new me you know new you   You knew new me, I know new you   You know a new moon   [place] downhill singing

## 2014-07-19 NOTE — Therapy (Signed)
[facility] [address] [place], [place], [address] Phone: [phone]   Fax:  [phone]  Speech Language Pathology Treatment  Patient Details  Name: [name] MRN: [id number] Date of Birth: 18-May-1945 Referring Provider:  Morton Peters.*  Encounter Date: 07/19/2014      End of Session - 07/19/14 0916    Visit Number 4   Number of Visits 8   Date for SLP Re-Evaluation 09/03/14   SLP Start Time 0803   SLP Stop Time  0844   SLP Time Calculation (min) 41 min   Activity Tolerance Patient tolerated treatment well      No past medical history on file.  No past surgical history on file.  There were no vitals filed for this visit.  Visit Diagnosis: Dysphonia      Subjective Assessment - 07/19/14 0810    Symptoms Pt arrives with same sounding high pitched hoarse voice as last session. When completing exercises she practiced at home pt's voice did not improve.               ADULT SLP TREATMENT - 07/19/14 0908    General Information   Behavior/Cognition Alert;Cooperative;Pleasant mood   Treatment Provided   Treatment provided Cognitive-Linquistic   Pain Assessment   Pain Assessment No/denies pain   Cognitive-Linquistic Treatment   Treatment focused on Voice   Skilled Treatment Upon entering ST room SLP told pt to complete phrases used for homework and pt did so with strained and hoarse voice. In order to habitualize WNL vocal quality, SLP utilized vocal normalizing techniques (imitation of SLP singing using nasal and liquid (/j/) consonants with long, lip rounded and /i/ vowels, and soft gentle alveolars) with singing with WNL voice. Pt spontaneously repeated phrases used for homework in singing voice after this 2 minute imitative practice with WNL vocal quality. SLP then facilitated pt's habitualization of WNL voicing by asking her to first describe and then feel the difference between WNL  vocal quality ("loose, good, relaxed voice") and her strained hoarse voice (*croaky, strangly voice"). Pt sang lines of two hymns with WNL voicing and stated, "I got my singing back!" SLP updated pt's HEP with more phrases and sentences for pt to practice and added vocal relaxation technique (pitch rise and fall) to HEP.   Assessment / Recommendations / Plan   Plan Continue with current plan of care   Progression Toward Goals   Progression toward goals Progressing toward goals          SLP Education - 07/19/14 0916    Education provided Yes   Education Details HEP   Person(s) Educated Patient   Methods Explanation;Demonstration;Verbal cues   Comprehension Verbalized understanding;Returned demonstration;Need further instruction            SLP Long Term Goals - 07/19/14 1448    SLP LONG TERM GOAL #1   Title pt will produce vowels with WNL vocal quality 80% success   Status Achieved   SLP LONG TERM GOAL #2   Title pt will produce words with initial long vowel sounds with WNL vocal quality with 80% success   Time 3   Period Weeks   Status On-going   SLP LONG TERM GOAL #3   Title pt will demo sentence responses with WNL vocal quality 80% success   Time 3   Period Weeks   Status On-going   SLP LONG TERM GOAL #4   Title pt will demo simple conversation of  5 minutes with WNL vocal quality 80% of the time.   Time 3   Period Weeks   Status On-going   SLP LONG TERM GOAL #5   Title pt will complete HEP for vocal relaxation with rare min A   Time 4   Period Weeks   Status On-going   SLP LONG TERM GOAL #6   Title pt will tell SLP three aspects of vocal hygiene with rare min A   Time 4   Period Weeks   Status New          Plan - 07/19/14 0917    Clinical Impression Statement Pt's WNL vocal quality has not carried over regularly to speech outside [facility] room. Skilled ST remains necessary for pt to feel difference between good and disordered vocal quality, change disordered vocal  quality, and to carry WNL quality at home.   Speech Therapy Frequency 2x / week   Duration --  3 weeks   Treatment/Interventions Compensatory techniques;Functional tasks;Patient/family education;SLP instruction and feedback   Potential to Achieve Goals Good   Potential Considerations Severity of impairments        Problem List Patient Active Problem List   Diagnosis Date Noted  . PULMONARY NODULE 02/05/2010  . DYSPNEA 02/05/2010  . COUGH 02/05/2010  . DM 02/04/2010  . ANEMIA, CHRONIC 02/04/2010  . HYPERTENSION 02/04/2010  . CAD 02/04/2010    [facility], SLP 07/19/2014, 9:27 AM  [facility] [address] [place], [place], [address] Phone: [phone]   Fax:  [phone]

## 2014-07-22 ENCOUNTER — Inpatient Hospital Stay: Payer: Self-pay | Admitting: Internal Medicine

## 2014-07-23 ENCOUNTER — Ambulatory Visit: Payer: Medicare HMO

## 2014-07-25 ENCOUNTER — Ambulatory Visit: Payer: Medicare HMO

## 2014-07-30 ENCOUNTER — Ambulatory Visit: Payer: Medicare HMO

## 2014-08-13 ENCOUNTER — Ambulatory Visit: Payer: Medicare Other | Attending: Otolaryngology

## 2014-08-13 DIAGNOSIS — R490 Dysphonia: Secondary | ICD-10-CM | POA: Insufficient documentation

## 2014-08-24 NOTE — Consult Note (Signed)
PATIENT NAME:  [name], [name] MR#:  [id number] DATE OF BIRTH:  01/31/1946  DATE OF CONSULTATION:  08/10/2012  REFERRING PHYSICIAN: Dr. Berline Lopes.  CONSULTING PHYSICIAN:  Isaias Cowman, MD  PRIMARY CARE PHYSICIAN: Dr. Hardin Negus.  CARDIOLOGIST: Dr. Clayborn Bigness.   CHIEF COMPLAINT: Weakness.   REASON FOR CONSULTATION: Consultation requested for evaluation of elevated troponin.   HISTORY OF PRESENT ILLNESS: The patient is a 69 year old female with known history of coronary artery disease, status post prior coronary stent. The patient was working at [employer] when she started to experience dizziness. The patient denied any chest pain or shortness of breath. She presented to [facility] Emergency Room where she was noted to have elevated blood pressure. Admission labs were most notable for elevated BUN and creatinine of 39 and 2.37 respectively. The patient did have elevated borderline troponin of 0.16 in the absence of chest pain or ECG changes.   PAST MEDICAL HISTORY:  1.  Status post bare-metal coronary stent 4/10.  2.  Hypertension.  3.  Non-insulin-dependent diabetes.  4.  History of congestive heart failure.  5.  History of gastritis.   MEDICATIONS: Aspirin 81 mg daily, Ramipril 5 mg daily, isosorbide mononitrate 1 daily, hydralazine 25 mg t.i.d., furosemide 20 mg b.i.d., amlodipine 5 mg daily and meloxicam 7.5 mg daily.   SOCIAL HISTORY: The patient lives with her husband. She denies tobacco abuse.   FAMILY HISTORY: No immediate family history of coronary disease or myocardial infarction.   REVIEW OF SYSTEMS:  GENERAL: The patient was dizzy and weak. Denies fever or chills.  EYES: No blurry vision.  EARS: No hearing loss.  RESPIRATORY: No shortness of breath.  CARDIOVASCULAR: No chest pain.  GASTROINTESTINAL: No nausea, vomiting, diarrhea or constipation.  GENITOURINARY: No dysuria or hematuria.  ENDOCRINE: No polyuria or polydipsia.  MUSCULOSKELETAL: No arthralgias or  myalgias.  NEUROLOGICAL: No focal muscle weakness or numbness.  PSYCHOLOGICAL: No depression or anxiety.   PHYSICAL EXAMINATION:  VITAL SIGNS: Blood pressure 152/67, pulse 76, respirations 20, temperature 98.4, and pulse ox 98%.  HEENT: Pupils equal, reactive to light and accommodation.  NECK: Supple without thyromegaly.  LUNGS: Clear.  HEART: Normal JVP. Normal PMI. Regular rate and rhythm. Normal S1, S2. No appreciable gallop, murmur, or rub.  ABDOMEN: Soft and nontender. Pulses were intact bilaterally.  MUSCULOSKELETAL: Normal muscle tone.  NEUROLOGIC: The patient is alert and oriented x 3. Motor and sensory both grossly intact.   IMPRESSION: A 69 year old female with multiple cardiovascular risk factors, known coronary artery disease, who presents with dizziness, elevated blood pressure and acute on chronic renal failure without chest pain with borderline elevated troponin, which is likely due to demand supply ischemia.   RECOMMENDATIONS:  1.  Agree with overall current therapy.  2.  Would defer full dose anticoagulation.  3.  Would defer cardiac catheterization in the absence of chest pain.  4.  Review 2D echocardiogram. ____________________________ Isaias Cowman, MD ap:aw D: 08/10/2012 09:44:34 ET T: 08/10/2012 09:55:22 ET JOB#: [id number]  cc: Isaias Cowman, MD, <Dictator> Isaias Cowman MD ELECTRONICALLY SIGNED 09/12/2012 13:41

## 2014-08-24 NOTE — H&P (Signed)
PATIENT NAME:  [name], [name] MR#:  [id number] DATE OF BIRTH:  [birth year]  DATE OF ADMISSION:  08/09/2012  PRIMARY CARE PHYSICIAN:  Dr. Macario Carls.  REFERRING PHYSICIAN:  Dr. Owens Shark.  CARDIOLOGIST:  Dr. Clayborn Bigness.   GASTROENTEROLOGIST:  Dr. Dionne Milo.   NEPHROLOGY:  Dr. Holley Raring.   CHIEF COMPLAINT:  Generalized weakness and dizziness.   HISTORY OF PRESENT ILLNESS:  The patient is a 69 year old African American female with a past medical history of coronary artery disease, status post bare metal stent in April 2010, hypertension, type 2 diabetes mellitus not insulin-dependent, congestive heart failure on Lasix, history of acute gastritis status post GI bleed which is resolved, is presenting to the ER with a chief complaint of one day history of generalized weakness associated with dizziness.  The patient is in her usual state of health until today and then she suddenly started feeling weak.  She works in [employer] and she started feeling dizzy at her work place.  When she is sitting she started feeling better.  When she stands up again she is feeling dizzy.  Denies any loss of consciousness.  No chest pain or shortness of breath.  Denies any abdominal pain, nausea, vomiting, or diarrhea.  The patient came into the ER as her dizziness is getting worse.  She denies any ringing in her ears or room spinning around her.  No similar complaints in the past.  She has some baseline renal insufficiency, given the previous creatinine was at 1.52 in the year 2010.  Today, in the ER patient's lab work has revealed a creatinine of 2.37 and detectable troponin at 0.16.  EKG revealed no changes.  Chest x-ray with no abnormal changes.  The patient is also reporting that her blood pressure is fluctuating a lot.  In the past 2 days her systolic blood pressure went up to as high as 175.  At the same time it dropped down to as low as 91.  The patient is concerned about fluctuating blood pressure.  Husband, son and  granddaughter are at bedside.  Denies any other complaints.   PAST MEDICAL HISTORY:  Hypertension, non-insulin-dependent diabetes mellitus, coronary artery disease status post bare stent in April 2010.  The patient was on Plavix, but currently on aspirin only.  History of acute gastritis, congestive heart failure.   PAST SURGICAL HISTORY:  Status post bare metal stent placement in April 2010.   ALLERGIES:  CODEINE.   HOME MEDICATIONS:  Ramipril 5 mg once daily, meloxicam 7.5 mg once daily, isosorbide mononitrate 1 tablet by mouth once daily, hydralazine 25 mg 3 times a day, glimepiride 4 mg once daily, furosemide 20 mg 2 times a day, aspirin 81 mg once daily, amlodipine 5 mg by mouth once daily.   PSYCHOSOCIAL HISTORY:  Lives at home with husband.  Denies smoking, alcohol or illicit drug usage.   FAMILY HISTORY:  Mother had history of heart problems and diabetes mellitus.  Brothers have history of lung cancer, blood pressure and diabetes mellitus.   REVIEW OF SYSTEMS:  CONSTITUTIONAL:  Denies any fever, but complaining of generalized fatigue and weakness.  Denies any weight loss or weight gain.  No chest pain.  EYES:  No blurry vision, glaucoma or cataracts.  EARS, NOSE, THROAT:  Denies any epistaxis, discharge, snoring, postnasal drip.  RESPIRATORY:  Denies any cough, wheezing, COPD, hemoptysis.  CARDIOVASCULAR:  No chest pain, orthopnea, palpitations.  Complaining of dizziness, but no syncope.  The patient has chronic history of coronary artery  disease and congestive heart failure.  GASTROINTESTINAL:  No nausea, vomiting, diarrhea.  Has history of gastritis.  Denies any rectal bleeding or hematemesis recently.  GENITOURINARY:  No dysuria or hematuria.  GYNECOLOGIC AND BREAST:  No breast masses or vaginal discharge.  ENDOCRINE:  No polyuria, nocturia, or thyroid problems.  HEMATOLOGIC AND LYMPHATIC:  No anemia, easy bruising, bleeding.  INTEGUMENTARY:  No acne, rash, lesions.   MUSCULOSKELETAL:  No joint pain in the neck, back, shoulder.  Denies any gout or arthritis.  NEUROLOGIC:  Denies any vertigo, ataxia, stroke or dysarthria.  PSYCHIATRIC:  No insomnia, ADD, OCD.   PHYSICAL EXAMINATION: VITAL SIGNS:  Temperature 98.5, pulse 68, respirations 20, blood pressure 134/58, pulse ox 98% on 2 liters.  GENERAL APPEARANCE:  Not under acute distress, moderately built and moderately nourished.  HEENT:  Normocephalic, atraumatic.  Pupils are equally reacting to light and accommodation.  No scleral icterus.  Extraocular movements are intact.  No sinus tenderness.  No nasal discharge.  Moist mucous membranes.  Oropharynx is intact.  No pharyngeal exudates.  NECK:  Supple.  No JVD.  No thyromegaly.  No lymphadenopathy.  LUNGS:  Clear to auscultation bilaterally.  No accessory muscle usage.  No anterior chest wall tenderness on palpation.  No wheezing.  No crackles.  CARDIAC:  S1, S2 normal.  Regular rate and rhythm.  No murmurs.  No gallops or thrills.  No edema.  Peripheral pulses are 2+.  GASTROINTESTINAL:  Soft.  Bowel sounds are positive in all four quadrants.  Nontender, nondistended.  No masses.  No rebound tenderness.  No hepatosplenomegaly.  NEUROLOGIC:  Awake, alert, oriented x 3.  Motor and sensory are grossly intact.  Cranial nerves II through XII are grossly intact.  Reflexes are 2+.  SKIN:  Warm to touch.  Normal turgor.  No lesions or rashes noticed.  MUSCULOSKELETAL:  No joint effusion, tenderness, erythema.  EXTREMITIES:  No pedal edema, cyanosis, clubbing.  PSYCHIATRIC:  Normal mood and affect.   LABORATORY DATA AND IMAGING STUDIES:  Chest x-ray, no acute changes.  No cardiomegaly.  A 12-lead EKG, normal sinus rhythm.  No ST-T wave changes.  Urinalysis, yellow in color, hazy in appearance.  Urine glucose is 150 mg/dL, bili negative, ketones negative, blood negative, protein is 100 mg/dL, nitrite negative, leukocyte esterase 2+, WBC 5 high-power field, hyaline  casts 45 in low power field.  WBC 7.8, hemoglobin is 10.4, hematocrit 31.2, platelet count is 214, glucose 426, BUN 39, creatinine 2.37, sodium 137, potassium 3.7, chloride 108, CO2 20, GFR is 24, anion gap 9, serum osmolality 301, calcium 8.1.  CK total 272, CPK-MB 1.6, troponin 0.16.   ASSESSMENT AND PLAN:  A 69 year old [place] American female with a past medical history of coronary artery disease status post stent, congestive heart failure on Lasix, non-insulin-dependent diabetes mellitus comes to the ER with a one day history of generalized weakness and dizziness.  Troponin is detectable at 0.16 and creatinine is elevated at 2.37, but patient denies any chest pain or shortness of breath.  1.  Detectable troponin.  The differential is non-ST-elevation myocardial infarction.  The patient is asymptomatic and no EKG changes, other than dizziness and generalized weakness.  We will cycle cardiac biomarkers and follow the trend of troponins.  2.  Acute coronary syndrome protocol with oxygen, sublingual nitroglycerin as needed, statin.  We will continue patient's home medication Imdur.  As the patient is complaining of dizziness, I am not considering beta-blocker at this time.  We  will keep her nothing by mouth except for meds.  We will obtain 2-D echocardiogram.  Cardiology consult is placed to Dr. Clayborn Bigness who has recommended heparin drip without bolusing her.  We will start the patient on heparin drip.  3.  Acute on chronic kidney disease.  We will give her gentle hydration with IV fluids with close monitoring for symptoms and signs of fluid overload given the history of congestive heart failure.  We will hold Lasix and ACE inhibitor.  A consult is placed to nephrology Dr. Holley Raring.   4.  Acute cystitis.  The patient is asymptomatic.  We will get urine culture and sensitivity.  We will give her IV Rocephin.  5.  Non-insulin-dependent diabetes mellitus.  We will hold patient's home medication as patient is  nothing by mouth.  The patient is currently hyperglycemic.  The patient is status post fluid bolus.  We will continue IV fluids and place her on insulin sliding scale.  The patient being nothing by mouth, I am holding her home medications.  6.  Dizziness with fluctuating blood pressure as endorsed by the patient.  We will check her orthostatics.  Holding amlodipine and ACE inhibitor in view of dizziness and renal insufficiency.  7.  Chronic history of congestive heart failure.  No volume overload noticed at this time.  Holding Lasix for now.  We will monitor renal function closely and check a CBC and Chem-8 in a.m.   8.  We will provide the patient with GI prophylaxis.  9.  Deep vein thrombosis prophylaxis.  The patient is on heparin drip.  10.  THE PATIENT IS FULL CODE.   TOTAL TIME SPENT ON ADMISSION:  Is 60 minutes.     The diagnosis and plan of care was discussed with the patient and her family members.  All their questions were answered to their satisfaction.    ____________________________ Nicholes Mango, MD ag:ea D: 08/10/2012 00:15:13 ET T: 08/10/2012 01:56:33 ET JOB#: [id number]  cc: Nicholes Mango, MD, <Dictator> Dr. Macario Carls Dr. Clayborn Bigness Dr. Silva Bandy MD ELECTRONICALLY SIGNED 08/14/2012 5:29

## 2014-08-24 NOTE — Consult Note (Signed)
Brief Consult Note: Diagnosis: Borderline elevated trop in setting of HTN and acute on chronic renal failure suuggests demand supply ischemia, high risk for contrast induced nephropathy.   Patient was seen by consultant.   Consult note dictated.   Comments: REC  Agree with current therapy, defer full dose anticoagulation, defer cardiac cath at this time, review echo.  Electronic Signatures: Isaias Cowman (MD)  (Signed 09-Apr-14 09:46)  Authored: Brief Consult Note   Last Updated: 09-Apr-14 09:46 by Isaias Cowman (MD)

## 2014-08-24 NOTE — Discharge Summary (Signed)
PATIENT NAME:  [name], [name] MR#:  [id number] DATE OF BIRTH:  Jul 13, 1945  DATE OF ADMISSION:  08/09/2012 DATE OF DISCHARGE:  08/10/2012  DISCHARGE DIAGNOSES:  1.  Elevated troponin, remained stable.  No acute cardiac event as per cardiologist.  2.  Acute worsening of renal function.  Creatinine more than 2, came down to 1.6.  3.  Hypertension, uncontrolled.  4.  Congestive heart failure, chronic, systolic, stable.   CONDITION ON DISCHARGE:  Stable.   CODE STATUS:  FULL CODE.    MEDICATIONS ON DISCHARGE:   1.  Isosorbide mononitrate 30 mg oral tablet extended-release once a day.  2.  Hydralazine 25 mg oral tablet 3 times a day.  3.  Amlodipine 5 mg oral tablet once a day.  4.  Meloxicam 7.5 mg oral tablet 2 times a day.  5.  Glimepiride 4 mg oral tablet once a day.  6.  Ramipril 5 mg oral capsule once a day.  7.  Aspirin 81 mg delayed-release tablet once a day.  8.  Simvastatin 20 mg oral tablet once a day. 9.  cefuroxime 500 mg oral tablet 2 times a day for four days.   DIET ON DISCHARGE:  Low sodium, low fat, low cholesterol diet.  Diet consistency regular.   ACTIVITY LIMITATION:  As tolerated.   TIMEFRAME TO FOLLOW-UP:  Within 2 to 4 weeks with Dr. Clayborn Bigness.    HISTORY OF PRESENT ILLNESS: A 69 year old African American female with history of coronary artery disease status post bare metal stent in April 2010, hypertension, type 2 diabetes mellitus not insulin-dependent, congestive heart failure on Lasix, history of acute gastritis status post GI bleeding which was resolved, presenting to ER with chief complaint of one day history of generalized weakness with dizziness.  She said if she was sitting started feeling better, stand up and again felt dizzy.  No chest pain, shortness of breath.  Denied any abdominal pain, nausea, vomiting, diarrhea.  She denied any ringing in her ears or room spinning around her.  No similar complaint in the past.  She has some baseline renal  insufficiency.  Previous creatinine was 1.5 in 2010.  Creatinine came up in the ER to 2.37.  Troponin was 0.16.  EKG showed no changes.  Her blood pressure was also fluctuating; systolic went up to 974 and which also dropped down to 90.   HOSPITAL COURSE AND STAY:  For her elevated troponin, she was admitted under observation and followed subsequent troponin level every 8 hourly for acute coronary syndrome history in the past.  She was started on sublingual nitroglycerin as needed. Was not given any beta-blocker due to complaint of dizziness.  A 2-D echo was ordered and cardiology consult was also placed for Dr. Clayborn Bigness.  Dr. Saralyn Pilar saw the patient and he advised to defer full dose anticoagulation and defer cardiac catheterization in the absence of chest pain.  We reviewed 2-D echocardiogram and he agreed with discharging the patient home and advised to follow up in the clinic.   OTHER MEDICAL ISSUES ADDRESSED DURING THE HOSPITAL STAY:  1.  Acute on chronic kidney disease.  IV fluid continued.  Lasix and ACE inhibitor were held.  Consult with Dr. Candiss Norse was done.  Creatinine came down to 1.6, which was possibly her baseline.  2.  Acute cystitis.  She was started on Rocephin and discharged on cefuroxime.  Urine culture was sent in.   3.  Non-insulin-dependent diabetes mellitus.  We continued her on sliding scale coverage  and IV fluids.  4.  Dizziness with fluctuating blood pressure.  Orthostatic pressures were checked.  Held amlodipine and ACE inhibitor due to borderline low blood pressure and renal failure.  5.  Chronic CHF.  There was no volume overload in the hospital, and patient remained stable.   CONSULTANTS IN THE HOSPITAL:  With Dr. Saralyn Pilar for cardiology and Dr. Murlean Iba for nephrology.    On presentation, creatinine was 2.37, which came down to 1.65.  Troponin level remained almost the same at 0.16, 0.17, 0.17.  Urinalysis was positive with 5 WBCs and 2+ leukocyte esterase, but  culture remained contamination.  Echocardiogram showed impaired relaxation pattern of LV diastolic, revealing mild mitral valve regurgitation, severely increased left ventricular posterior wall thickness.     TOTAL TIME SPENT ON THIS DISCHARGE:  45 minutes.     ____________________________ Ceasar Lund Anselm Jungling, MD vgv:ea D: 08/13/2012 16:04:31 ET T: 08/14/2012 06:56:17 ET JOB#: [id number]  cc: Ceasar Lund. Anselm Jungling, MD, <Dictator> Murlean Iba, MD Morton Peters., MD  Vaughan Basta MD ELECTRONICALLY SIGNED 08/14/2012 22:27
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
## 2014-09-02 NOTE — Discharge Summary (Signed)
PATIENT NAME:  [name], [name] MR#:  [id number] DATE OF BIRTH:  [birth year]  DATE OF ADMISSION:  07/21/2014 DATE OF DISCHARGE:  07/22/2014  PRESENTING COMPLAINT: Chest pain.   DISCHARGE DIAGNOSES: 1.  Malignant hypertension.  2.  Chest pain, appears atypical.  3.  History of coronary artery disease status post stent in the past.   CONDITION ON DISCHARGE: Fair. Vitals stable.   CODE STATUS: FULL.   DISCHARGE MEDICATIONS:  1.  Glimepiride 4 mg p.o. daily.  2.  Ramipril 5 mg daily.  3.  Simvastatin 20 mg at bedtime.  4.  Hydralazine 100 mg t.i.d.  5.  Aspirin 81 mg at bedtime.  6.  Imdur 30 mg extended release p.o. daily.  7.  Labetalol 100 mg b.i.d.  8.  Protonix 40 mg daily.  9.  Acetaminophen/oxycodone 325/5 one tablet every 6 hours as needed.  10.  Nitroglycerin 0.4 mg sublingual as needed.   NOTE: The patient advised to stop taking her Midodrine.   DISCHARGE INSTRUCTIONS:  1.  Follow up with Dr. Laurian Brim in 1 to 2 weeks. 2.  Follow up with Dr. Clayborn Bigness in 2 weeks.  3.  Keep log of your blood pressure at home.   CONSULTANTS: Cardiology - Dr. Clayborn Bigness.   DIAGNOSTIC DATA: Chest x-ray showed cardiomegaly.   No acute findings. Troponin 0.5, 0.04, 0.04. Hemoglobin and hematocrit 8.2 and 25.4. Creatinine 1.53, potassium 3.9.  BRIEF SUMMARY OF HOSPITAL COURSE: [name] is a 69 year old African American female with long-standing history of hypertension, diabetes, CKD stage III, neuropathy and diastolic congestive heart failure who came into the Emergency Room with right-sided chest pain and was noted to have elevated blood pressure. She was admitted with:  1.  Hypertensive urgency. Received some nitroglycerin paste and sublingual nitroglycerin. Systolic blood pressure was in the 170s. Her Midodrine was discontinued. Her home meds were continued and she was given p.r.n. hydralazine. She was advised a low salt diet.  2.  Chest pain, right-sided, without any acute EKG  changes. Troponin mildly chronically elevated. She was seen by Dr. Clayborn Bigness who recommends outpatient follow-up.  3.  Elevated troponin, which is mild. She has history of chronically elevated mild troponin associated with CKD and/or hypertension. Her aspirin, nitroglycerin and statins were continued.  4.  Type 2 diabetes, stable. Continued glimepiride. 5.  Hyperlipidemia. On statins.  6.  CKD, stage III.  7.  History of orthostatic hypotension in January of 2016. She was started on Midodrine at that time. Discontinued it given her elevated blood pressure.   Hospital stay otherwise remained stable. She remained a FULL code.   TIME SPENT: 40 minutes.   ____________________________ Hart Rochester Posey Pronto, MD sap:sb D: 07/23/2014 07:19:00 ET T: 07/23/2014 08:04:49 ET JOB#: [id number]  cc: Safa Derner A. Posey Pronto, MD, <Dictator> Dwayne D. Clayborn Bigness, MD Morton Peters., MD Ilda Basset MD ELECTRONICALLY SIGNED 08/06/2014 12:16

## 2014-09-02 NOTE — Consult Note (Signed)
PATIENT NAME:  [name], [name] MR#:  [id number] DATE OF BIRTH:  1946/04/09  DATE OF CONSULTATION:  07/22/2014  PRIMARY PHYSICIAN:  Morton Peters., MD  REFERRING PHYSICIAN:  Juluis Mire, MD CONSULTING PHYSICIAN:  Dwayne D. Callwood, MD  INDICATION:  Chest pain, coronary artery disease, hypertension.  HISTORY OF PRESENT ILLNESS: The patient is a 69 year old black female with history of known coronary artery disease, hypertension, diabetes, history of PCI and stent in the past, hyperlipidemia, chronic renal insufficiency, chronic anemia, with occasional orthostatic hypotension, and diastolic heart failure, who presented with right-sided chest pain which started the day previously. She describes it as vague chest discomfort in the right side. No radiation of the pain. No cough. No fever. The patient was worse with deep inspiration. Elevated blood pressures of about 681 and diastolics of about 70. She was found to have an elevated BNP up about 1000.  She has a known history of renal insufficiency. Creatinine of 1.5. Also has chronic anemia, unclear etiology. Chest x-ray suggests cardiomegaly. She was short of breath. EKG was nonspecific.  For her elevated blood pressure and right-sided chest pain, she was admitted for further evaluation and care. The patient subsequently ruled out for myocardial infarction.   PAST MEDICAL HISTORY: Coronary artery disease, hypertension, diabetes, glaucoma, hyperlipidemia, chronic renal insufficiency, orthostatic hypotension, chronic diastolic heart failure.   PAST SURGICAL HISTORY: PCI and stent.   ALLERGIES: CODEINE.   FAMILY HISTORY: Coronary artery disease, diabetes, hypertension.   SOCIAL HISTORY: Lives with her husband, recently retired. Denies smoking or alcohol consumption.   MEDICATIONS: Tylenol with Codeine every 6 hours p.r.n., aspirin 81 mg a day, glimepiride 4 mg once a day, hydralazine 100 mg 2 times a day, Imdur 30 mg a day, labetalol 100  mg twice a day, midodrine 5 mg twice a day, Protonix 40 mg a day, Ramipril 5 mg once a day, simvastatin 20 mg a day.   REVIEW OF SYSTEMS: No blackout spells or syncope. She had 1 episode of vomiting. No fever, chills, or sweats. No weight loss. No weight gain. No hemoptysis, hematemesis. No bright red blood per rectum.  She has had right-sided chest pain and elevated blood pressure.   PHYSICAL EXAMINATION: VITAL SIGNS: Blood pressure initially was 200/80, pulse of 75, respiratory rate of 16, afebrile.  HEENT: Normocephalic, atraumatic. Pupils equal and reactive to light.  NECK: Supple. No significant JVD.  LUNGS: Clear to auscultation and percussion. No significant wheeze, rhonchi, or rale.  HEART: Regular rate and rhythm. Positive S4, soft S3. Systolic ejection murmur at the apex 2/6. ABDOMEN:  Exam was benign. Positive bowel sounds. No rebound or guarding.  EXTREMITIES: Within normal limits. No significant cyanosis, clubbing, or edema. NEUROLOGIC:  Exam is intact.  SKIN:  Exam normal.    LABORATORY DATA: Glucose of 97. BNP 1000. BUN of 22, creatinine of 1.5, sodium of 142, potassium of 3.9, chloride of 112, CO2 is 23, calcium of 8. Troponin is 0.05. White count of 4.3, hemoglobin of 8, hematocrit of 25, platelet count of 226,000.   IMAGING:  Chest x-ray, cardiomegaly, vascular prominence. No clear evidence of failure or pneumonia.   EKG: Sinus rhythm, 75. Nonspecific ST-T wave changes.  ASSESSMENT:  1.  Atypical chest pain. 2.  Known coronary artery disease. 3.  Hypertensive urgency. 4.  Anemia. 5.  Diabetes. 6.  Renal insufficiency.  7.  Hyperlipidemia. 8.  Mild obesity. 9.  Gastroesophageal reflux disease.   PLAN: 1.  I agree with admit.  Ruled out for myocardial infarction.  2.  Continue medical therapy.  3.  Continue blood pressure control by advancing medications for hypertensive urgency.  4.  Continue diabetes management, sliding scale insulin therapy as necessary. 5.   Continue symptomatic pain control for right-sided interval chest discomfort.  6.  For GERD, continue Protonix therapy.  7.  Recommend GI evaluation because of anemia and possibly GI bleeding. 8.  Renal insufficiency. Consider nephrology input, with elevated creatinine.  9.  Borderline troponins. Continue current therapy, possibly related to renal insufficiency.  10.  We will treat the patient medically for now. I do not recommend any invasive cardiac studies for the atypical chest pain. Would control her hypertension and then consider GI workup for anemia.    ____________________________ Loran Senters. Clayborn Bigness, MD ddc:LT D: 07/22/2014 18:00:00 ET T: 07/22/2014 18:36:45 ET JOB#: [id number]  cc: Dwayne D. Clayborn Bigness, MD, <Dictator> Yolonda Kida MD ELECTRONICALLY SIGNED 07/23/2014 13:35

## 2014-09-02 NOTE — H&P (Signed)
PATIENT NAME:  [name], [name] MR#:  [id number] DATE OF BIRTH:  01-31-1946  DATE OF ADMISSION:  07/21/2014  REFERRING PHYSICIAN: Gretchen Short. Beather Arbour, MD  PRIMARY CARE PRACTITIONER: Morton Peters., MD  PRIMARY CARDIOLOGIST: Corey Skains, MD  ADMITTING PHYSICIAN: Juluis Mire, MD   CHIEF COMPLAINT: Right-sided chest pain, which started around 3:00 p.m. yesterday.    HISTORY OF PRESENT ILLNESS: A 69 year old African American female with a past medical history of hypertension; diabetes mellitus, type 2; coronary artery disease, status post stent; hyperlipidemia; CKD, stage III; chronic anemia; orthostatic hypotension and chronic diastolic congestive heart failure, presents with the complaints of right-sided chest pain, which started around 3:00 p.m. yesterday and continues to have the chest pain. Did have 1 episode of emesis at home, but denies any nausea or vomiting at this time. No dizziness or loss of consciousness. No radiation of pain. No cough. No fever. The patient stated the chest pain increases on taking deep inspiration. No radiation of pain. In the Emergency Room, on arrival, the patient was noted to have elevated systolic blood pressures range of 163-845 with diastolic of 70 mmHg. Workup by the ED physician revealed an elevated BNP of 1099 and the rest of the labs were in stable range, as before. She does have a CKD with a creatinine in the range of 1.53, and it is stable at this time. She also does have a chronic anemia, and hemoglobin and hematocrit are low but stable at 0.8/25.4. Chest x-ray showed some cardiomegaly, but otherwise no pulmonary vascular congestion. EKG showed normal sinus rhythm with ventricular rate of 74 beats per minute with left axis deviation, no acute ST-T changes. The patient received nitro paste as well as sublingual nitroglycerin in the Emergency Room and still her blood pressure is elevated in the range of 210/74. Hence, hospitalist service was consulted  for further evaluation and management. The patient is comfortable, sitting in the bed at this time, states still has chest pain, but it is slightly better. Denies any nausea, vomiting. No dizziness. No loss of consciousness. No radiation of pain.   PAST MEDICAL HISTORY:  1.  Coronary artery disease, status post stent.  2.  Hypertension.  3.  Diabetes mellitus, type 2, with neuropathy.  4.  Glaucoma.  5.  Hyperlipidemia.  6.  CKD, stage III.   7.  History of orthostatic hypotension diagnosed in January 2016, and the patient is on midodrine.  8.  History of diastolic congestive heart failure with normal echocardiogram done in January 2016.  PAST SURGICAL HISTORY: Cardiac stent placement.   ALLERGIES: CODEINE.   FAMILY HISTORY: Positive for coronary artery disease, diabetes mellitus, and hypertension.   SOCIAL HISTORY: Married, lives at home with her husband. Denies any history of smoking, alcohol, or substance abuse.   HOME MEDICATIONS:  1.  Acetaminophen/oxycodone 325/5 mg tablet 1 tablet every 6 hours as needed for pain.  2.  Aspirin 81 mg 1 tablet orally once a day.  3.  Glimepiride 4 mg 1 tablet orally once a day in the morning.   4.  Hydralazine 100 mg tablet 1 tablet orally 3 times a day.  5.  Isosorbide mononitrate 30 mg tablet extended release 1 tablet orally once a day in the morning.  6.  Labetalol 100 mg 1 tablet orally 2 times a day.  7.  Midodrine 5 mg oral tablet 1 tablet 2 times a day.  8.  Pantoprazole 40 mg 1 tablet orally once a day.  9.  Ramipril 5 mg 1 tablet orally once a day.  10.  Simvastatin 20 mg 1 tablet orally once a day.    REVIEW OF SYSTEMS:  CONSTITUTIONAL: Negative for fever, chills, fatigue, or generalized weakness.  EYES: Negative for blurred vision, double vision. No pain. No redness. No discharge.  EARS, NOSE, AND THROAT: Negative for tinnitus, ear pain, hearing loss, epistaxis, nasal discharge, or difficulty swallowing.  RESPIRATORY: Negative for  cough, wheezing, dyspnea, hemoptysis.  CARDIOVASCULAR: Positive for right-sided chest pain as noted in history of present illness,  increases with deep inspiration. Negative for palpitations, dizziness, syncopal episodes, orthopnea, dyspnea on exertion, or pedal edema.  GASTROINTESTINAL: Had 1 episode of emesis at home, but otherwise denies any nausea, vomiting, and had abdominal pain, hematemesis, melena, rectal bleeding at this time.  GENITOURINARY: Negative for dysuria, frequency, urgency, or hematuria.  ENDOCRINE: Negative for polyuria, nocturia, heat or cold intolerance.  HEMATOLOGIC AND LYMPHATIC: Positive for chronic anemia. Denies any easy bruising, bleeding, or swollen glands.  INTEGUMENTARY: Negative for acne, skin rash, or lesions.  MUSCULOSKELETAL: Negative for neck or back pain. No history of arthritis or gout.  NEUROLOGICAL: Negative for focal weakness or numbness. No history of CVA, TIA, or seizure disorder.  PSYCHIATRIC: Negative for anxiety, insomnia, depression.    PHYSICAL EXAMINATION:  VITAL SIGNS: Temperature 98.6 degrees Fahrenheit, pulse rate 76 per minute, respirations 18 per minute, blood pressure 218/80, oxygen saturation 97% on room air.  GENERAL: Well developed, well nourished, alert and oriented, comfortably resting in the bed, not in any acute distress.  HEAD: Atraumatic, normocephalic.  EYES: Pupils are equal, reacting to light and accommodation. No conjunctival pallor. No icterus. Extraocular movements intact.  NOSE: No drainage. No lesions.  EARS: No drainage.  ORAL CAVITY: No mucosal lesions. No exudates.  NECK: Supple. No JVD. No thyromegaly. No carotid bruit. Range of motion of neck within normal limits.  RESPIRATORY: Good respiratory effort. Not using accessory muscles of respiration. Bilateral vesicular breath sounds present. No rales or rhonchi.  CARDIOVASCULAR: S1, S2 regular. No murmurs, gallops, or clicks. Pulses equal at carotid, femoral, and pedal  pulses. No peripheral edema.  GASTROINTESTINAL: Abdomen soft, nontender. No hepatosplenomegaly. No masses. No rigidity. No guarding. Bowel sounds present and equal in all 4 quadrants.  GENITOURINARY: Deferred.  MUSCULOSKELETAL: No joint tenderness or effusion. Range of motion adequate. Strength and tone equal bilaterally.  SKIN: Inspection within normal limits.  LYMPHATIC: No cervical lymphadenopathy.  VASCULAR: Good dorsalis pedis and posterior tibial pulses.  NEUROLOGIC: Alert, awake, and oriented x 3. Cranial nerves II-XII grossly intact. No sensory deficit. Motor strength 5/5 in both lower and upper extremities. DTRs 2+ bilaterally and symmetrical.  PSYCHIATRIC: Alert, awake, and oriented x 3. Judgment and insight adequate. Memory and mood within normal limits.   ANCILLARY DATA:  LABORATORY DATA: Serum glucose 97, BNP 1999, BUN 22, creatinine 1.53, sodium 142, potassium 3.9, chloride 112, bicarbonate 23, total calcium 8.0. Troponin 0.05. WBC 4.3, hemoglobin 8.2, hematocrit 25.4, platelet count 226,000.   IMAGING STUDIES: Chest x-ray: Cardiomegaly, mild vascular prominence without interstitial or alveolar edema. No effusions. No acute findings.   EKG: Normal sinus rhythm with ventricular rate of 74 beats per minute. Left axis deviation present. No acute ST-T changes.    ASSESSMENT AND PLAN: A 69 year old African American female with a history of hypertension, coronary artery disease, status post stent; diabetes mellitus, type 2; chronic kidney disease, stage III; neuropathy; diastolic congestive heart failure with normal echocardiogram done in January 2016;  hyperlipidemia; history of orthostatic hypotension on midodrine since January 2016; chronic anemia, who presents with the complaints of right-sided chest pain, which is continuous, which started around yesterday afternoon, noted to have elevated blood pressure the range of 210/80s with hypertensive urgency and troponin mild elevation, which is  chronic troponin elevation.  1.  Hypertensive urgency. Received nitroglycerin paste and sublingual nitroglycerin. Systolic blood pressure still high. PLAN: Admit to monitored unit. We will discontinue midodrine because of hypertensive urgency. Continue home medications. We will give intravenous hydralazine 10 mg q. 6 hours p.r.n. for elevated blood pressure and consider nitroglycerin drip if her blood pressure not adequately controlled.  2.  Chest pain, right-sided, which is continuous since yesterday. EKG no acute changes. Troponin mild elevation, which is chronically elevated. Seems chest pain is atypical; however, in the setting of history of coronary artery disease, cycle cardiac enzymes and cardiology consultation requested for further advice.  3.  Elevated troponin, which is a mild elevation. History of chronically elevated troponin, likely associated with chronic kidney disease or hypertension. PLAN: Monitor. Cycle cardiac enzymes in the setting of history of coronary artery disease. Continue aspirin, nitroglycerin, statin. Cardiology consultation for further advice.  4.  Coronary artery disease, status post stent on medications. Continue home medications. Further advice, as per cardiology advice.  5.  Diabetes mellitus type 2, stable. Continue home medications along with sliding scale insulin.  6.  Hyperlipidemia on statin. Continue same.  7.  Chronic kidney disease, stage III. Stable. Creatinine in stable range. PLAN: Monitor BMP, urine output.  8.  History of orthostatic hypotension in January 2016 and the patient using midodrine. No symptoms now. We will discontinue midodrine because of uncontrolled hypertension and follow blood pressure closely.  9.  Chronic anemia. Hemoglobin and hematocrit mildly low but stable range. Monitor hemoglobin and hematocrit closely.  10.  Deep vein thrombosis prophylaxis. Subcutaneous heparin.  11.  Gastrointestinal prophylaxis. Proton pump inhibitor.   CODE  STATUS: Full code.   TIME SPENT: 55 minutes.     ____________________________ Juluis Mire, MD enr:bm D: 07/22/2014 01:34:15 ET T: 07/22/2014 02:11:36 ET JOB#: [id number]  cc: Juluis Mire, MD, <Dictator> Morton Peters., MD Corey Skains, MD Juluis Mire MD ELECTRONICALLY SIGNED 07/22/2014 21:42

## 2014-09-17 ENCOUNTER — Inpatient Hospital Stay
Admission: EM | Admit: 2014-09-17 | Discharge: 2014-09-20 | DRG: 682 | Disposition: A | Discharge status: Home / Self Care | Payer: Medicare HMO | Attending: Internal Medicine | Admitting: Internal Medicine

## 2014-09-17 ENCOUNTER — Encounter: Payer: Self-pay | Admitting: Emergency Medicine

## 2014-09-17 ENCOUNTER — Emergency Department: Payer: Medicare HMO

## 2014-09-17 ENCOUNTER — Inpatient Hospital Stay: Payer: Medicare HMO

## 2014-09-17 ENCOUNTER — Inpatient Hospital Stay
Admit: 2014-09-17 | Discharge: 2014-09-17 | Disposition: A | Discharge status: Home / Self Care | Payer: Medicare HMO | Attending: Internal Medicine | Admitting: Internal Medicine

## 2014-09-17 DIAGNOSIS — Z9861 Coronary angioplasty status: Secondary | ICD-10-CM | POA: Diagnosis not present

## 2014-09-17 DIAGNOSIS — D631 Anemia in chronic kidney disease: Secondary | ICD-10-CM | POA: Diagnosis present

## 2014-09-17 DIAGNOSIS — R52 Pain, unspecified: Secondary | ICD-10-CM

## 2014-09-17 DIAGNOSIS — I129 Hypertensive chronic kidney disease with stage 1 through stage 4 chronic kidney disease, or unspecified chronic kidney disease: Secondary | ICD-10-CM | POA: Diagnosis present

## 2014-09-17 DIAGNOSIS — A419 Sepsis, unspecified organism: Secondary | ICD-10-CM | POA: Diagnosis present

## 2014-09-17 DIAGNOSIS — I5021 Acute systolic (congestive) heart failure: Secondary | ICD-10-CM | POA: Diagnosis present

## 2014-09-17 DIAGNOSIS — Z79891 Long term (current) use of opiate analgesic: Secondary | ICD-10-CM

## 2014-09-17 DIAGNOSIS — N189 Chronic kidney disease, unspecified: Secondary | ICD-10-CM

## 2014-09-17 DIAGNOSIS — N183 Chronic kidney disease, stage 3 (moderate): Secondary | ICD-10-CM | POA: Diagnosis present

## 2014-09-17 DIAGNOSIS — Z794 Long term (current) use of insulin: Secondary | ICD-10-CM | POA: Diagnosis not present

## 2014-09-17 DIAGNOSIS — K219 Gastro-esophageal reflux disease without esophagitis: Secondary | ICD-10-CM | POA: Diagnosis present

## 2014-09-17 DIAGNOSIS — J189 Pneumonia, unspecified organism: Secondary | ICD-10-CM | POA: Diagnosis present

## 2014-09-17 DIAGNOSIS — R634 Abnormal weight loss: Secondary | ICD-10-CM | POA: Diagnosis present

## 2014-09-17 DIAGNOSIS — R109 Unspecified abdominal pain: Secondary | ICD-10-CM | POA: Diagnosis present

## 2014-09-17 DIAGNOSIS — Z79899 Other long term (current) drug therapy: Secondary | ICD-10-CM

## 2014-09-17 DIAGNOSIS — Z885 Allergy status to narcotic agent status: Secondary | ICD-10-CM

## 2014-09-17 DIAGNOSIS — Z87891 Personal history of nicotine dependence: Secondary | ICD-10-CM | POA: Diagnosis not present

## 2014-09-17 DIAGNOSIS — E782 Mixed hyperlipidemia: Secondary | ICD-10-CM | POA: Diagnosis present

## 2014-09-17 DIAGNOSIS — I248 Other forms of acute ischemic heart disease: Secondary | ICD-10-CM | POA: Diagnosis present

## 2014-09-17 DIAGNOSIS — E11319 Type 2 diabetes mellitus with unspecified diabetic retinopathy without macular edema: Secondary | ICD-10-CM | POA: Diagnosis present

## 2014-09-17 DIAGNOSIS — I10 Essential (primary) hypertension: Secondary | ICD-10-CM

## 2014-09-17 DIAGNOSIS — I509 Heart failure, unspecified: Secondary | ICD-10-CM

## 2014-09-17 HISTORY — DX: Essential (primary) hypertension: I10

## 2014-09-17 HISTORY — DX: Type 2 diabetes mellitus without complications: E11.9

## 2014-09-17 LAB — CBC WITH DIFFERENTIAL/PLATELET
BASOS ABS: 0 10*3/uL (ref 0–0.1)
BASOS PCT: 1 %
EOS ABS: 0.1 10*3/uL (ref 0–0.7)
EOS PCT: 3 %
HCT: 27.6 % — ABNORMAL LOW (ref 35.0–47.0)
HEMOGLOBIN: 8.9 g/dL — AB (ref 12.0–16.0)
Lymphocytes Relative: 15 %
Lymphs Abs: 0.5 10*3/uL — ABNORMAL LOW (ref 1.0–3.6)
MCH: 25.1 pg — AB (ref 26.0–34.0)
MCHC: 32.1 g/dL (ref 32.0–36.0)
MCV: 78.3 fL — AB (ref 80.0–100.0)
Monocytes Absolute: 0.1 10*3/uL — ABNORMAL LOW (ref 0.2–0.9)
Monocytes Relative: 4 %
Neutro Abs: 2.5 10*3/uL (ref 1.4–6.5)
Neutrophils Relative %: 77 %
Platelets: 302 10*3/uL (ref 150–440)
RBC: 3.52 MIL/uL — AB (ref 3.80–5.20)
RDW: 20.4 % — AB (ref 11.5–14.5)
WBC: 3.2 10*3/uL — ABNORMAL LOW (ref 3.6–11.0)

## 2014-09-17 LAB — TROPONIN I
Troponin I: 0.07 ng/mL — ABNORMAL HIGH (ref <0.031)
Troponin I: 0.09 ng/mL — ABNORMAL HIGH (ref <0.031)
Troponin I: 0.11 ng/mL — ABNORMAL HIGH (ref <0.031)

## 2014-09-17 LAB — COMPREHENSIVE METABOLIC PANEL
ALT: 17 U/L (ref 14–54)
ANION GAP: 8 (ref 5–15)
AST: 29 U/L (ref 15–41)
Albumin: 2.8 g/dL — ABNORMAL LOW (ref 3.5–5.0)
Alkaline Phosphatase: 68 U/L (ref 38–126)
BUN: 24 mg/dL — AB (ref 6–20)
CALCIUM: 7.8 mg/dL — AB (ref 8.9–10.3)
CO2: 24 mmol/L (ref 22–32)
Chloride: 107 mmol/L (ref 101–111)
Creatinine, Ser: 1.53 mg/dL — ABNORMAL HIGH (ref 0.44–1.00)
GFR calc Af Amer: 39 mL/min — ABNORMAL LOW (ref >60)
GFR calc non Af Amer: 34 mL/min — ABNORMAL LOW (ref >60)
GLUCOSE: 103 mg/dL — AB (ref 65–99)
Potassium: 3.8 mmol/L (ref 3.5–5.1)
Sodium: 139 mmol/L (ref 135–145)
TOTAL PROTEIN: 6.7 g/dL (ref 6.5–8.1)
Total Bilirubin: 0.6 mg/dL (ref 0.3–1.2)

## 2014-09-17 LAB — BRAIN NATRIURETIC PEPTIDE
B NATRIURETIC PEPTIDE 5: 1358 pg/mL — AB (ref 0.0–100.0)
B Natriuretic Peptide: 1525 pg/mL — ABNORMAL HIGH (ref 0.0–100.0)

## 2014-09-17 LAB — LACTIC ACID, PLASMA
Lactic Acid, Venous: 1 mmol/L (ref 0.5–2.0)
Lactic Acid, Venous: 0.9 mmol/L (ref 0.5–2.0)

## 2014-09-17 LAB — URINALYSIS COMPLETE WITH MICROSCOPIC (ARMC ONLY)
BILIRUBIN URINE: NEGATIVE
Glucose, UA: NEGATIVE mg/dL
KETONES UR: NEGATIVE mg/dL
Nitrite: NEGATIVE
PH: 6 (ref 5.0–8.0)
Protein, ur: 100 mg/dL — AB
Specific Gravity, Urine: 1.014 (ref 1.005–1.030)

## 2014-09-17 LAB — GLUCOSE, CAPILLARY
Glucose-Capillary: 88 mg/dL (ref 65–99)
Glucose-Capillary: 83 mg/dL (ref 65–99)

## 2014-09-17 LAB — TSH: TSH: 1.204 u[IU]/mL (ref 0.350–4.500)

## 2014-09-17 MED ORDER — NITROGLYCERIN 2 % TD OINT
1.0000 [in_us] | TOPICAL_OINTMENT | Freq: Four times a day (QID) | TRANSDERMAL | Status: DC
  Administered 2014-09-17 – 2014-09-19 (×8): 1 [in_us] via TOPICAL
  Filled 2014-09-17 (×8): qty 1

## 2014-09-17 MED ORDER — AMLODIPINE BESYLATE 10 MG PO TABS
10.0000 mg | ORAL_TABLET | Freq: Every day | ORAL | Status: DC
  Administered 2014-09-17 – 2014-09-20 (×4): 10 mg via ORAL
  Filled 2014-09-17 (×4): qty 1

## 2014-09-17 MED ORDER — LABETALOL HCL 5 MG/ML IV SOLN
10.0000 mg | Freq: Once | INTRAVENOUS | Status: AC
  Administered 2014-09-17: 10 mg via INTRAVENOUS

## 2014-09-17 MED ORDER — LABETALOL HCL 100 MG PO TABS
50.0000 mg | ORAL_TABLET | Freq: Every day | ORAL | Status: DC
  Administered 2014-09-17: 50 mg via ORAL
  Filled 2014-09-17: qty 0.5

## 2014-09-17 MED ORDER — ONDANSETRON HCL 4 MG/2ML IJ SOLN
4.0000 mg | Freq: Four times a day (QID) | INTRAMUSCULAR | Status: DC | PRN
  Administered 2014-09-19: 4 mg via INTRAVENOUS
  Filled 2014-09-17: qty 2

## 2014-09-17 MED ORDER — DEXTROSE 5 % IV SOLN
INTRAVENOUS | Status: AC
  Filled 2014-09-17: qty 500

## 2014-09-17 MED ORDER — SODIUM CHLORIDE 0.9 % IJ SOLN: 3.0000 mL | INTRAMUSCULAR | Status: DC | PRN

## 2014-09-17 MED ORDER — ONDANSETRON HCL 4 MG PO TABS: 4.0000 mg | ORAL_TABLET | Freq: Four times a day (QID) | ORAL | Status: DC | PRN

## 2014-09-17 MED ORDER — HEPARIN SODIUM (PORCINE) 5000 UNIT/ML IJ SOLN
5000.0000 [IU] | Freq: Three times a day (TID) | INTRAMUSCULAR | Status: DC
  Administered 2014-09-17 – 2014-09-20 (×9): 5000 [IU] via SUBCUTANEOUS
  Filled 2014-09-17 (×9): qty 1

## 2014-09-17 MED ORDER — FUROSEMIDE 10 MG/ML IJ SOLN
40.0000 mg | Freq: Once | INTRAMUSCULAR | Status: AC
  Administered 2014-09-17: 40 mg via INTRAVENOUS

## 2014-09-17 MED ORDER — ISOSORBIDE DINITRATE 10 MG PO TABS
30.0000 mg | ORAL_TABLET | Freq: Two times a day (BID) | ORAL | Status: DC
  Administered 2014-09-17 – 2014-09-20 (×7): 30 mg via ORAL
  Filled 2014-09-17 (×3): qty 3
  Filled 2014-09-17: qty 2
  Filled 2014-09-17 (×5): qty 3

## 2014-09-17 MED ORDER — OXYCODONE-ACETAMINOPHEN 5-325 MG PO TABS: 1.0000 | ORAL_TABLET | Freq: Four times a day (QID) | ORAL | Status: DC | PRN

## 2014-09-17 MED ORDER — SODIUM CHLORIDE 0.9 % IV SOLN: 250.0000 mL | INTRAVENOUS | Status: DC | PRN

## 2014-09-17 MED ORDER — DEXTROSE 5 % IV SOLN
1.0000 g | Freq: Once | INTRAVENOUS | Status: AC
  Administered 2014-09-17: 1 g via INTRAVENOUS

## 2014-09-17 MED ORDER — OMEGA-3-ACID ETHYL ESTERS 1 G PO CAPS
1.0000 g | ORAL_CAPSULE | Freq: Every day | ORAL | Status: DC
  Administered 2014-09-17 – 2014-09-20 (×4): 1 g via ORAL
  Filled 2014-09-17 (×4): qty 1

## 2014-09-17 MED ORDER — MELOXICAM 7.5 MG PO TABS
7.5000 mg | ORAL_TABLET | Freq: Two times a day (BID) | ORAL | Status: DC
  Administered 2014-09-17 – 2014-09-20 (×6): 7.5 mg via ORAL
  Filled 2014-09-17 (×8): qty 1

## 2014-09-17 MED ORDER — CEFTRIAXONE SODIUM 1 G IJ SOLR
INTRAMUSCULAR | Status: AC
  Administered 2014-09-17: 1 g via INTRAVENOUS
  Filled 2014-09-17: qty 10

## 2014-09-17 MED ORDER — INSULIN ASPART 100 UNIT/ML ~~LOC~~ SOLN
0.0000 [IU] | Freq: Three times a day (TID) | SUBCUTANEOUS | Status: DC
Start: 2014-09-17 — End: 2014-09-20

## 2014-09-17 MED ORDER — DICLOFENAC SODIUM 25 MG PO TBEC
50.0000 mg | DELAYED_RELEASE_TABLET | Freq: Every day | ORAL | Status: DC
  Administered 2014-09-17 – 2014-09-18 (×2): 50 mg via ORAL
  Filled 2014-09-17 (×4): qty 2

## 2014-09-17 MED ORDER — FERROUS SULFATE 325 (65 FE) MG PO TABS
325.0000 mg | ORAL_TABLET | Freq: Every day | ORAL | Status: DC
  Administered 2014-09-17 – 2014-09-20 (×4): 325 mg via ORAL
  Filled 2014-09-17 (×4): qty 1

## 2014-09-17 MED ORDER — AZITHROMYCIN 250 MG PO TABS
250.0000 mg | ORAL_TABLET | Freq: Every day | ORAL | Status: DC
  Administered 2014-09-18 – 2014-09-20 (×3): 250 mg via ORAL
  Filled 2014-09-17 (×3): qty 1

## 2014-09-17 MED ORDER — AZITHROMYCIN 250 MG PO TABS
500.0000 mg | ORAL_TABLET | Freq: Every day | ORAL | Status: AC
  Administered 2014-09-17: 500 mg via ORAL
  Filled 2014-09-17: qty 2

## 2014-09-17 MED ORDER — DOCUSATE SODIUM 100 MG PO CAPS
100.0000 mg | ORAL_CAPSULE | Freq: Two times a day (BID) | ORAL | Status: DC
  Filled 2014-09-17: qty 1

## 2014-09-17 MED ORDER — DEXTROSE 5 % IV SOLN: 500.0000 mg | Freq: Once | INTRAVENOUS | Status: DC

## 2014-09-17 MED ORDER — ACETAMINOPHEN 325 MG PO TABS
650.0000 mg | ORAL_TABLET | Freq: Four times a day (QID) | ORAL | Status: DC | PRN
  Administered 2014-09-18: 650 mg via ORAL
  Filled 2014-09-17 (×2): qty 2

## 2014-09-17 MED ORDER — HYDRALAZINE HCL 50 MG PO TABS
100.0000 mg | ORAL_TABLET | Freq: Three times a day (TID) | ORAL | Status: DC
  Administered 2014-09-17 – 2014-09-20 (×9): 100 mg via ORAL
  Filled 2014-09-17: qty 2
  Filled 2014-09-17: qty 1
  Filled 2014-09-17 (×8): qty 2

## 2014-09-17 MED ORDER — GLIMEPIRIDE 2 MG PO TABS
1.0000 mg | ORAL_TABLET | Freq: Every day | ORAL | Status: DC
  Administered 2014-09-18 – 2014-09-20 (×3): 1 mg via ORAL
  Filled 2014-09-17: qty 1
  Filled 2014-09-17: qty 2
  Filled 2014-09-17 (×2): qty 0.5

## 2014-09-17 MED ORDER — KETOROLAC TROMETHAMINE 30 MG/ML IJ SOLN
INTRAMUSCULAR | Status: AC
  Filled 2014-09-17: qty 1

## 2014-09-17 MED ORDER — PANTOPRAZOLE SODIUM 40 MG PO TBEC
40.0000 mg | DELAYED_RELEASE_TABLET | Freq: Every day | ORAL | Status: DC
  Administered 2014-09-17 – 2014-09-20 (×4): 40 mg via ORAL
  Filled 2014-09-17 (×4): qty 1

## 2014-09-17 MED ORDER — INSULIN ASPART 100 UNIT/ML ~~LOC~~ SOLN: 3.0000 [IU] | Freq: Three times a day (TID) | SUBCUTANEOUS | Status: DC

## 2014-09-17 MED ORDER — NITROGLYCERIN 2 % TD OINT
TOPICAL_OINTMENT | TRANSDERMAL | Status: AC
  Administered 2014-09-17: 1 [in_us] via TOPICAL
  Filled 2014-09-17: qty 1

## 2014-09-17 MED ORDER — ASPIRIN 81 MG PO CHEW
81.0000 mg | CHEWABLE_TABLET | Freq: Every day | ORAL | Status: DC
  Administered 2014-09-17 – 2014-09-20 (×4): 81 mg via ORAL
  Filled 2014-09-17 (×4): qty 1

## 2014-09-17 MED ORDER — SODIUM CHLORIDE 0.9 % IJ SOLN
3.0000 mL | Freq: Two times a day (BID) | INTRAMUSCULAR | Status: DC
  Administered 2014-09-17 – 2014-09-19 (×4): 3 mL via INTRAVENOUS

## 2014-09-17 MED ORDER — FUROSEMIDE 10 MG/ML IJ SOLN
INTRAMUSCULAR | Status: AC
Start: 2014-09-17 — End: 2014-09-17
  Filled 2014-09-17: qty 4

## 2014-09-17 MED ORDER — RAMIPRIL 5 MG PO CAPS
5.0000 mg | ORAL_CAPSULE | Freq: Every day | ORAL | Status: DC
  Administered 2014-09-17 – 2014-09-20 (×4): 5 mg via ORAL
  Filled 2014-09-17 (×4): qty 1

## 2014-09-17 MED ORDER — ZOLPIDEM TARTRATE 5 MG PO TABS: 5.0000 mg | ORAL_TABLET | Freq: Every evening | ORAL | Status: DC | PRN

## 2014-09-17 MED ORDER — SIMVASTATIN 20 MG PO TABS
20.0000 mg | ORAL_TABLET | Freq: Every evening | ORAL | Status: DC
  Administered 2014-09-17 – 2014-09-19 (×3): 20 mg via ORAL
  Filled 2014-09-17 (×3): qty 1

## 2014-09-17 MED ORDER — LABETALOL HCL 200 MG PO TABS
100.0000 mg | ORAL_TABLET | Freq: Four times a day (QID) | ORAL | Status: DC
  Administered 2014-09-17 – 2014-09-19 (×7): 100 mg via ORAL
  Filled 2014-09-17 (×7): qty 1

## 2014-09-17 MED ORDER — FUROSEMIDE 40 MG PO TABS
40.0000 mg | ORAL_TABLET | Freq: Two times a day (BID) | ORAL | Status: DC
  Administered 2014-09-17 – 2014-09-20 (×6): 40 mg via ORAL
  Filled 2014-09-17 (×6): qty 1

## 2014-09-17 MED ORDER — POTASSIUM CHLORIDE CRYS ER 20 MEQ PO TBCR
20.0000 meq | EXTENDED_RELEASE_TABLET | Freq: Every day | ORAL | Status: DC
  Administered 2014-09-17 – 2014-09-20 (×4): 20 meq via ORAL
  Filled 2014-09-17 (×4): qty 1

## 2014-09-17 MED ORDER — KETOROLAC TROMETHAMINE 30 MG/ML IJ SOLN
30.0000 mg | Freq: Once | INTRAMUSCULAR | Status: AC
  Administered 2014-09-17: 30 mg via INTRAVENOUS

## 2014-09-17 MED ORDER — SENNA 8.6 MG PO TABS
1.0000 | ORAL_TABLET | Freq: Two times a day (BID) | ORAL | Status: DC
  Administered 2014-09-17 – 2014-09-20 (×7): 8.6 mg via ORAL
  Filled 2014-09-17 (×7): qty 1

## 2014-09-17 MED ORDER — SODIUM CHLORIDE 0.9 % IV SOLN
1000.0000 mL | Freq: Once | INTRAVENOUS | Status: AC
  Administered 2014-09-17: 1000 mL via INTRAVENOUS

## 2014-09-17 MED ORDER — DOCUSATE SODIUM 100 MG PO CAPS
100.0000 mg | ORAL_CAPSULE | Freq: Two times a day (BID) | ORAL | Status: DC
  Administered 2014-09-17 – 2014-09-20 (×7): 100 mg via ORAL
  Filled 2014-09-17 (×6): qty 1

## 2014-09-17 MED ORDER — INSULIN ASPART 100 UNIT/ML ~~LOC~~ SOLN: 0.0000 [IU] | Freq: Every day | SUBCUTANEOUS | Status: DC

## 2014-09-17 MED ORDER — LABETALOL HCL 5 MG/ML IV SOLN
INTRAVENOUS | Status: AC
  Administered 2014-09-17: 10 mg via INTRAVENOUS
  Filled 2014-09-17: qty 4

## 2014-09-17 MED ORDER — SODIUM CHLORIDE 0.9 % IJ SOLN
3.0000 mL | Freq: Two times a day (BID) | INTRAMUSCULAR | Status: DC
  Administered 2014-09-17 – 2014-09-18 (×2): 3 mL via INTRAVENOUS

## 2014-09-17 MED ORDER — ACETAMINOPHEN 650 MG RE SUPP
650.0000 mg | Freq: Four times a day (QID) | RECTAL | Status: DC | PRN
Start: 2014-09-17 — End: 2014-09-20

## 2014-09-17 MED ORDER — KETOROLAC TROMETHAMINE 30 MG/ML IJ SOLN: 30.0000 mg | Freq: Once | INTRAMUSCULAR | Status: DC

## 2014-09-17 NOTE — ED Notes (Signed)
Pt to ed with multiple complaints.  Pt states she has been having symptoms of weight loss, right side abd pain, intermittent sore throat, and neck pain since November of last year.  Pt states she has been to the doctor multiple times but that they are unable to figure out what is wrong.

## 2014-09-17 NOTE — Consult Note (Signed)
[facility] Consultation Note  Patient ID: [name], MRN: [id number], DOB/AGE: 1945-08-24 69 y.o. Admit date: 09/17/2014   Date of Consult: 09/17/2014 Primary Physician: Morton Peters, MD Primary Cardiologist: None  Chief Complaint:  Chief Complaint  Patient presents with  . Abdominal Pain   Reason for Consult: shortness of breath and congestive heart failure with elevated troponin  HPI: 69 y.o. female with known essential hypertension mixed hyperlipidemia chronic kidney disease stage III diabetes with complication anemia with new onset severe shortness of breath and significant right-sided chest discomfort. The patient has had this chest discomfort for the last several days increasing in frequency and intensity when seen in the emergency room. At that time the patient had bilateral lower pleural effusions and some minimal pulmonary edema with an elevated BNP of 1525. Also there was an elevated troponin level most consistent with demand ischemia and hypoxia rather than acute coronary syndrome. Currently has had possible acute systolic dysfunction congestive heart failure with elevated BNP PA and possibly secondary to multiple factors including LV systolic dysfunction chronic kidney disease diabetes malignant hypertension and anemia. Blood pressure was significantly elevated into the 812 systolic range and has had some improvements with medication management at this time. The patient also has had no history of heart dysfunction and/or valvular heart disease in the past.  Past Medical History  Diagnosis Date  . Hypertension   . Diabetes mellitus without complication       Surgical History: History reviewed. No pertinent past surgical history.   Home Meds: Prior to Admission medications   Medication Sig Start Date End Date Taking? Authorizing Provider  diclofenac (VOLTAREN) 50 MG EC tablet Take 50 mg by mouth daily with lunch.   Yes Historical Provider, MD   docusate sodium (COLACE) 100 MG capsule Take 100 mg by mouth 2 (two) times daily.   Yes Historical Provider, MD  ferrous sulfate 325 (65 FE) MG tablet Take 325 mg by mouth daily.   Yes Historical Provider, MD  glimepiride (AMARYL) 2 MG tablet Take 1 mg by mouth daily with breakfast. Take 1/2 tablet daily   Yes Historical Provider, MD  hydrALAZINE (APRESOLINE) 100 MG tablet Take 100 mg by mouth 3 (three) times daily.    Yes Historical Provider, MD  isosorbide dinitrate (ISORDIL) 30 MG tablet Take 30 mg by mouth 2 (two) times daily.    Yes Historical Provider, MD  meloxicam (MOBIC) 7.5 MG tablet Take 7.5 mg by mouth 2 (two) times daily with a meal.   Yes Historical Provider, MD  oxyCODONE-acetaminophen (PERCOCET/ROXICET) 5-325 MG per tablet Take 1 tablet by mouth 3 (three) times daily as needed. pain 07/10/14  Yes Historical Provider, MD  pantoprazole (PROTONIX) 40 MG tablet Take 40 mg by mouth daily.   Yes Historical Provider, MD  ramipril (ALTACE) 5 MG capsule Take 5 mg by mouth daily.    Yes Historical Provider, MD  simvastatin (ZOCOR) 20 MG tablet Take 20 mg by mouth every evening.    Yes Historical Provider, MD  amLODipine (NORVASC) 10 MG tablet Take 10 mg by mouth daily.    Historical Provider, MD  aspirin 81 MG tablet Take 81 mg by mouth daily.    Historical Provider, MD  labetalol (NORMODYNE) 100 MG tablet Take 50 mg by mouth daily.    Historical Provider, MD  Omega-3 Fatty Acids (FISH OIL PO) Take 1 capsule by mouth daily.    Historical Provider, MD    Inpatient Medications:  .  amLODipine  10 mg Oral Daily  . aspirin  81 mg Oral Daily  . [START ON 09/18/2014] azithromycin  250 mg Oral Daily  . diclofenac  50 mg Oral Q lunch  . docusate sodium  100 mg Oral BID  . docusate sodium  100 mg Oral BID  . ferrous sulfate  325 mg Oral Daily  . furosemide  40 mg Oral BID  . [START ON 09/18/2014] glimepiride  1 mg Oral Q breakfast  . heparin  5,000 Units Subcutaneous 3 times per day  .  hydrALAZINE  100 mg Oral TID  . insulin aspart  0-15 Units Subcutaneous TID WC  . insulin aspart  0-5 Units Subcutaneous QHS  . insulin aspart  3 Units Subcutaneous TID WC  . isosorbide dinitrate  30 mg Oral BID  . labetalol  50 mg Oral Daily  . meloxicam  7.5 mg Oral BID WC  . nitroGLYCERIN  1 inch Topical 4 times per day  . omega-3 acid ethyl esters  1 g Oral Daily  . pantoprazole  40 mg Oral Daily  . potassium chloride  20 mEq Oral Daily  . ramipril  5 mg Oral Daily  . senna  1 tablet Oral BID  . simvastatin  20 mg Oral QPM  . sodium chloride  3 mL Intravenous Q12H  . sodium chloride  3 mL Intravenous Q12H      Allergies:  Allergies  Allergen Reactions  . Codeine     REACTION: hallucinations    History   Social History  . Marital Status: Married    Spouse Name: N/A  . Number of Children: N/A  . Years of Education: N/A   Occupational History  . Not on file.   Social History Main Topics  . Smoking status: Former Research scientist (life sciences)  . Smokeless tobacco: Not on file  . Alcohol Use: No  . Drug Use: No  . Sexual Activity: Yes    Birth Control/ Protection: Post-menopausal   Other Topics Concern  . Not on file   Social History Narrative     No family history on file.   Review of Systems Positive for shortness of breath and right-sided chest discomfort Negative for: General:  chills, fever, night sweats or weight changes.  Cardiovascular: PND orthopnea syncope dizziness  Dermatological skin lesions rashes Respiratory: Cough congestion Urologic: Frequent urination urination at night and hematuria Abdominal: negative for nausea, vomiting, diarrhea, bright red blood per rectum, melena, or hematemesis Neurologic: negative for visual changes, and/or hearing changes  All other systems reviewed and are otherwise negative except as noted above.  Labs: No results for input(s): CKTOTAL, CKMB, TROPONINI in the last 72 hours. Lab Results  Component Value Date   WBC 3.2*  09/17/2014   HGB 8.9* 09/17/2014   HCT 27.6* 09/17/2014   MCV 78.3* 09/17/2014   PLT 302 09/17/2014    Recent Labs Lab 09/17/14 0827  NA 139  K 3.8  CL 107  CO2 24  BUN 24*  CREATININE 1.53*  CALCIUM 7.8*  PROT 6.7  BILITOT 0.6  ALKPHOS 68  ALT 17  AST 29  GLUCOSE 103*   No results found for: CHOL, HDL, LDLCALC, TRIG No results found for: DDIMER  Radiology/Studies:  Dg Chest 2 View  09/17/2014   CLINICAL DATA:  Right side chest pain beginning 09/16/2014. Shortness of breath.  EXAM: CHEST  2 VIEW  COMPARISON:  PA and lateral chest 07/21/2014.  FINDINGS: There is cardiomegaly. Since the prior study, the patient  has developed small to moderate pleural effusions and basilar airspace disease, worse on the right. No pneumothorax is identified. There is no evidence of pulmonary edema. Degenerative change is seen about the shoulders.  IMPRESSION: New small to moderate pleural effusions and basilar airspace disease, worse on the right.  Cardiomegaly without edema.   Electronically Signed   By: Inge Rise M.D.   On: 09/17/2014 10:07    EKG:   Weights: Filed Weights   09/17/14 0818  Weight: 153 lb (69.4 kg)     Physical Exam: Blood pressure 161/79, pulse 122, temperature 98.9 F (37.2 C), temperature source Oral, resp. rate 21, height 5\' 7"  (1.702 m), weight 153 lb (69.4 kg), SpO2 96 %. Body mass index is 23.96 kg/(m^2). General: Well developed, well nourished, in no acute distress. Head eyes ears nose throat: Normocephalic, atraumatic, sclera non-icteric, no xanthomas, nares are without discharge. No apparent thyromegaly and/or mass  Lungs: Normal respiratory effort.  no wheezes,  positives, no rhonchi.With decreased breath sounds in the bases   Heart: RRR with normal S1 S2. no murmur gallop, no rub, PMI is normal size and placement, carotid upstroke normal without bruit, jugular venous pressure is normal Abdomen: Soft, non-tender, non-distended with normoactive bowel  sounds. No hepatomegaly. No rebound/guarding. No obvious abdominal masses. Abdominal aorta is normal size without bruit Extremittrace edema. no cyanosis, no clubbing, no ulcers  Peripheral : 2+ bilateral upper extremity pulses, 2+ bilateral femoral pulses, 2+ bilateral dorsal pedal pulse Neuro: Alert and oriented. No facial asymmetry. No focal deficit. Moves all extremities spontaneously. Musculoskeletal: Normal muscle tone without kyphosis Psych:  Responds to questions appropriately with a normal affect.    Assessment: 69 year old female with acute systolic dysfunction congestive heart failure with pleural effusions possibly secondary to essential hypertension with malignant properties chronic kidney disease stage III anemia with an elevated troponin most consistent with hypoxia and demand ischemia rather than acute coronary syndrome: Plan 1. Continue intravenous Lasix for lower extremity edema pleural effusions and acute heart failure 2. Continue oxygenation for any hypoxia exacerbating above 3. Continue treatment of malignant hypertension with amlodipine and ACE inhibitor and would consider the possibility of beta blocker which may help with sinus tachycardia although the patient currently is taking labetalol would possibly increase the dose for concerns of tachycardia 4. Increase dose of labetalol for sinus tachycardia and hypertension control 5. Echocardiogram for LV systolic dysfunction valvular heart disease causing current concerns of heart failure 6. Follow closely for chronic kidney disease and anemia exacerbating above 7. Possible discontinuation of myelotoxic and due to concerns of chronic kidney disease 8. Further diagnostic testing and treatment options after above Corey Skains M.D. [facility] 09/17/2014, 4:51 PM

## 2014-09-17 NOTE — H&P (Signed)
[facility] NAME: [name]    MR#:  [id number]  DATE OF BIRTH:  1945-08-28  DATE OF ADMISSION:  09/17/2014  PRIMARY CARE PHYSICIAN: Morton Peters, MD   REQUESTING/REFERRING PHYSICIAN:   CHIEF COMPLAINT:   Chief Complaint  Patient presents with  . Abdominal Pain    HISTORY OF PRESENT ILLNESS: [name]  is a 69 y.o. female with a known history of hypertension, diabetes mellitus and renal insufficiency who comes to the hospital with complaints of right-sided chest pains. States that the right-sided chest pain started yesterday. It is described as sharp, intermittent pain increasing with deep breathing,  sometimes taking her breath away. She denies any significant cough or phlegm production and denies any fevers. She admitted of no significant change in her pain with food intake, although she admits of some shortness of breath as well as PND and orthopnea. She tells me that she's been using few pillows to sleep and could not lay down on the right side to sleep yesterday and decided to come to emergency room for further evaluation. In the emergency room she was noted to have malignant hypertension with systolic blood pressure above 200s. Her chest x-ray was also concerning for lateral pleural effusions which are new.. There was a concern of basilar airspace disease, worse on the right supple pneumonia, although patient's white blood cell count is normal. PND is pending. Hospitalist services were contacted for admission.  PAST MEDICAL HISTORY:   Past Medical History  Diagnosis Date  . Hypertension   . Diabetes mellitus without complication     PAST SURGICAL HISTORY: History reviewed. No pertinent past surgical history.  SOCIAL HISTORY:  History  Substance Use Topics  . Smoking status: Former Research scientist (life sciences)  . Smokeless tobacco: Not on file  . Alcohol Use: No    FAMILY HISTORY: No family history on file.  DRUG  ALLERGIES:  Allergies  Allergen Reactions  . Codeine     REACTION: hallucinations    ROS  MEDICATIONS AT HOME:  Prior to Admission medications   Medication Sig Start Date End Date Taking? Authorizing Provider  diclofenac (VOLTAREN) 50 MG EC tablet Take 50 mg by mouth daily with lunch.   Yes Historical Provider, MD  docusate sodium (COLACE) 100 MG capsule Take 100 mg by mouth 2 (two) times daily.   Yes Historical Provider, MD  ferrous sulfate 325 (65 FE) MG tablet Take 325 mg by mouth daily.   Yes Historical Provider, MD  glimepiride (AMARYL) 2 MG tablet Take 1 mg by mouth daily with breakfast. Take 1/2 tablet daily   Yes Historical Provider, MD  hydrALAZINE (APRESOLINE) 100 MG tablet Take 100 mg by mouth 3 (three) times daily.    Yes Historical Provider, MD  isosorbide dinitrate (ISORDIL) 30 MG tablet Take 30 mg by mouth 2 (two) times daily.    Yes Historical Provider, MD  meloxicam (MOBIC) 7.5 MG tablet Take 7.5 mg by mouth 2 (two) times daily with a meal.   Yes Historical Provider, MD  oxyCODONE-acetaminophen (PERCOCET/ROXICET) 5-325 MG per tablet Take 1 tablet by mouth 3 (three) times daily as needed. pain 07/10/14  Yes Historical Provider, MD  pantoprazole (PROTONIX) 40 MG tablet Take 40 mg by mouth daily.   Yes Historical Provider, MD  ramipril (ALTACE) 5 MG capsule Take 5 mg by mouth daily.    Yes Historical Provider, MD  simvastatin (ZOCOR) 20 MG tablet Take 20 mg by mouth  every evening.    Yes Historical Provider, MD  amLODipine (NORVASC) 10 MG tablet Take 10 mg by mouth daily.    Historical Provider, MD  aspirin 81 MG tablet Take 81 mg by mouth daily.    Historical Provider, MD  labetalol (NORMODYNE) 100 MG tablet Take 50 mg by mouth daily.    Historical Provider, MD  Omega-3 Fatty Acids (FISH OIL PO) Take 1 capsule by mouth daily.    Historical Provider, MD      PHYSICAL EXAMINATION:   VITAL SIGNS: Blood pressure 187/85, pulse 131, temperature 98.9 F (37.2 C), resp. rate 22,  height 5\' 7"  (1.702 m), weight 69.4 kg (153 lb), SpO2 95 %.  GENERAL:  69 y.o.-year-old patient lying in the bed with no significant distress. Slightly uncomfortable whenever moves around in the bed grabbing her right side of the chest. EYES: Pupils equal, round, reactive to light and accommodation. No scleral icterus. Extraocular muscles intact.  HEENT: Head atraumatic, normocephalic. Oropharynx and nasopharynx clear.  NECK:  Supple, patient has jugular venous distention. No thyroid enlargement, no tenderness.  LUNGS: Diminished breath sounds right basilar area, no wheezing, rales, few rhonchi noted. Patient does have crepitations on the right basilar area, few crackles were also noted in the left basilar area. No use of accessory muscles of respiration. Patient is guarding her right side of the chest trying not to take deep breath. CARDIOVASCULAR: S1, S2 normal. No murmurs, rubs, or gallops. Tachycardic. ABDOMEN: Soft, nontender, nondistended. Bowel sounds present. No organomegaly or mass.  EXTREMITIES: No pedal edema, cyanosis, or clubbing.  NEUROLOGIC: Cranial nerves II through XII are intact. Muscle strength 5/5 in all extremities. Sensation intact. Gait not checked.  PSYCHIATRIC: The patient is alert and oriented x 3.  SKIN: No obvious rash, lesion, or ulcer. Minimal lower extremity swelling, trace , no calf tenderness, or cyanosis noted.  LABORATORY PANEL:   CBC  Recent Labs Lab 09/17/14 0827  WBC 3.2*  HGB 8.9*  HCT 27.6*  PLT 302  MCV 78.3*  MCH 25.1*  MCHC 32.1  RDW 20.4*  LYMPHSABS 0.5*  MONOABS 0.1*  EOSABS 0.1  BASOSABS 0.0   ------------------------------------------------------------------------------------------------------------------  Chemistries   Recent Labs Lab 09/17/14 0827  NA 139  K 3.8  CL 107  CO2 24  GLUCOSE 103*  BUN 24*  CREATININE 1.53*  CALCIUM 7.8*  AST 29  ALT 17  ALKPHOS 68  BILITOT 0.6    ------------------------------------------------------------------------------------------------------------------  Cardiac Enzymes No results for input(s): TROPONINI in the last 168 hours. ------------------------------------------------------------------------------------------------------------------  RADIOLOGY: Dg Chest 2 View  09/17/2014   CLINICAL DATA:  Right side chest pain beginning 09/16/2014. Shortness of breath.  EXAM: CHEST  2 VIEW  COMPARISON:  PA and lateral chest 07/21/2014.  FINDINGS: There is cardiomegaly. Since the prior study, the patient has developed small to moderate pleural effusions and basilar airspace disease, worse on the right. No pneumothorax is identified. There is no evidence of pulmonary edema. Degenerative change is seen about the shoulders.  IMPRESSION: New small to moderate pleural effusions and basilar airspace disease, worse on the right.  Cardiomegaly without edema.   Electronically Signed   By: Inge Rise M.D.   On: 09/17/2014 10:07    EKG:          patient's EKG reveals sinus tachycardia at 121 bpm, left axis deviation and no acute ST-T changes  IMPRESSION AND PLAN: *. Malignant essential hypertension. Admit patient to medical floor. Continue her on usual outpatient medications. Add labetalol  IV as needed as well as hydralazine as needed, although hydralazine is not the best medication because of sinus tachycardia. Will also add Lasix, following blood pressure readings closely. *Studies congestive heart failure with bilateral pleural effusions, more on the right side. Good cardiologist involved. We'll get echocardiogram done to evaluate patient's ejection fraction. We will also continue patient on Lasix as well as hydralazine, nitroglycerin, ACE inhibitor, watching patient's kidney function closely. He had *CK D . Patient will be on hydralazine as well as ACE inhibitor as well as Medications Lasix. We will be watching patient's kidney function  closely. We'll get nephrologist involved for further recommendations and will get ultrasound of kidneys as well. * Suspected pneumonia on the right side, we will initiate patient on Zithromax and get sputum cultures if possible. *Diabetes mellitus. Will continue patient on her usual home medications and sliding scale insulin while she is in the hospital.  *Unintentional weight loss of unclear etiology, initiate patient on PPIs. Will get gastroenterologist involved and since patient has been on multiple nonsteroidal anti-inflammatory medications., I feel she would benefit from a gastroenterology evaluation including EGD.   All the records are reviewed and case discussed with ED provider. Management plans discussed with the patient, family and they are in agreement.  CODE STATUS:    TOTAL TIME TAKING CARE OF THIS PATIENT: 50  minutes.    Theodoro Grist M.D on 09/17/2014 at 12:32 PM  Between 7am to 6pm - Pager - [phone] After 6pm go to [email or web address] - password [email or web address] [facility]  Office  [phone]  CC: Primary care physician; Morton Peters, MD

## 2014-09-17 NOTE — ED Provider Notes (Signed)
[facility] Emergency Department Provider Note  ____________________________________________  Time seen: 9:35 AM  I have reviewed the triage vital signs and the nursing notes.   HISTORY  Chief Complaint Abdominal Pain      HPI [name] is a 69 y.o. female who presents with several seemingly unrelated complaints that been occurring for some time. It appears that her #1 complaint is right sided pain which started last night it is aching in nature and worse with twisting or movement. She denies shortness of breath she denies chest pain or fevers chills. She also notes that all of her joints ache and this is been going on for some time. Finally she complains of a sore throat for greater than 1 month. She saw her  pCP 3 days agobut refused to take the medication that he gave her.     Past Medical History  Diagnosis Date  . Hypertension   . Diabetes mellitus without complication     Patient Active Problem List   Diagnosis Date Noted  . PULMONARY NODULE 02/05/2010  . DYSPNEA 02/05/2010  . COUGH 02/05/2010  . DM 02/04/2010  . ANEMIA, CHRONIC 02/04/2010  . HYPERTENSION 02/04/2010  . CAD 02/04/2010    History reviewed. No pertinent past surgical history.  Current Outpatient Rx  Name  Route  Sig  Dispense  Refill  . amLODipine (NORVASC) 10 MG tablet   Oral   Take 10 mg by mouth daily.         Marland Kitchen aspirin 81 MG tablet   Oral   Take 81 mg by mouth daily.         Marland Kitchen glimepiride (AMARYL) 2 MG tablet   Oral   Take 1 mg by mouth daily with breakfast.         . hydrALAZINE (APRESOLINE) 100 MG tablet   Oral   Take 100 mg by mouth 2 (two) times daily.         . isosorbide dinitrate (ISORDIL) 30 MG tablet   Oral   Take 30 mg by mouth daily.          Marland Kitchen labetalol (NORMODYNE) 100 MG tablet   Oral   Take 50 mg by mouth daily.         . Omega-3 Fatty Acids (FISH OIL PO)   Oral   Take 1 capsule by mouth daily.         Marland Kitchen  oxyCODONE-acetaminophen (PERCOCET/ROXICET) 5-325 MG per tablet   Oral   Take 1 tablet by mouth 3 (three) times daily as needed. pain         . pantoprazole (PROTONIX) 40 MG tablet   Oral   Take 40 mg by mouth daily.         . ramipril (ALTACE) 5 MG capsule   Oral   Take 5 mg by mouth daily.          . simvastatin (ZOCOR) 20 MG tablet   Oral   Take 20 mg by mouth every evening.            Allergies Codeine  No family history on file.  Social History History  Substance Use Topics  . Smoking status: Former Research scientist (life sciences)  . Smokeless tobacco: Not on file  . Alcohol Use: No    Review of Systems  Constitutional: Negative for fever. Eyes: Negative for visual changes. ENT: Positive for sore throat Cardiovascular: Negative for chest pain. Respiratory: Negative for shortness of breath. Gastrointestinal: Negative for abdominal pain,  vomiting and diarrhea. Genitourinary: Negative for dysuria. Musculoskeletal: Negative for back pain. Positive for joint pains diffusely Skin: Negative for rash. Neurological: Negative for headaches, focal weakness or numbness. Psychiatric: Positive for anxiety  10-point ROS otherwise negative.  ____________________________________________   PHYSICAL EXAM:  VITAL SIGNS: ED Triage Vitals  Enc Vitals Group     BP 09/17/14 0818 178/82 mmHg     Pulse Rate 09/17/14 0818 125     Resp 09/17/14 0818 18     Temp 09/17/14 0818 98.9 F (37.2 C)     Temp src --      SpO2 09/17/14 0818 97 %     Weight 09/17/14 0818 153 lb (69.4 kg)     Height 09/17/14 0818 5\' 7"  (1.702 m)     Head Cir --      Peak Flow --      Pain Score 09/17/14 0821 7     Pain Loc --      Pain Edu? --      Excl. in [place]? --      Constitutional: Alert and oriented. Well appearing and in no distress. Eyes: Conjunctivae are normal. PERRL. Normal extraocular movements. ENT   Head: Normocephalic and atraumatic.   Nose: No congestion/rhinnorhea.   Mouth/Throat:  Mucous membranes are moist.   Neck: No stridor. Hematological/Lymphatic/Immunilogical: No cervical lymphadenopathy. Cardiovascular: Normal rate, regular rhythm. Normal and symmetric distal pulses are present in all extremities. No murmurs, rubs, or gallops. Respiratory: Normal respiratory effort without tachypnea nor retractions. Breath sounds are clear and equal bilaterally. No wheezes/rales/rhonchi. Gastrointestinal: Soft and nontender. No distention. There is no CVA tenderness. Genitourinary: deferred Musculoskeletal: Nontender with normal range of motion in all extremities. No joint effusions.  No lower extremity tenderness nor edema. Neurologic:  Normal speech and language. No gross focal neurologic deficits are appreciated. Speech is normal.  Skin:  Skin is warm, dry and intact. No rash noted. Psychiatric: Mood and affect are normal. Speech and behavior are normal. Patient exhibits appropriate insight and judgment.  ____________________________________________    LABS (pertinent positives/negatives)  Patient with white blood cell count 3.2 Chronically elevated creatinine  ____________________________________________   EKG   Date: 09/17/2014  Rate: 121  Rhythm: Sinus tachycardia  QRS Axis: normal  Intervals: normal  ST/T Wave abnormalities: normal  Conduction Disutrbances: none  Narrative Interpretation: unremarkable      ____________________________________________    RADIOLOGY  Consistent with multilobar pneumonia  ____________________________________________   PROCEDURES  Procedure(s) performed:None  Critical Care performed: None  ____________________________________________   INITIAL IMPRESSION / ASSESSMENT AND PLAN / ED COURSE  Pertinent labs & imaging results that were available during my care of the patient were reviewed by me and considered in my medical decision making (see chart for  details).  ----------------------------------------- 11:29 AM on 09/17/2014 -----------------------------------------  Chest x-ray consistent with bibasilar pneumonia. This explains her tachycardia and mild hypoxia and right-sided chest discomfort. We will draw blood cultures and give her Rocephin IV and azithromycin IV and admit her given multi lobar pneumonia and tachycardia.  ____________________________________________   FINAL CLINICAL IMPRESSION(S) / ED DIAGNOSES  Final diagnoses:  Pain  Community acquired pneumonia     Lavonia Drafts, MD 09/17/14 1136

## 2014-09-18 LAB — GLUCOSE, CAPILLARY
GLUCOSE-CAPILLARY: 89 mg/dL (ref 65–99)
GLUCOSE-CAPILLARY: 105 mg/dL — AB (ref 65–99)
Glucose-Capillary: 105 mg/dL — ABNORMAL HIGH (ref 65–99)
Glucose-Capillary: 113 mg/dL — ABNORMAL HIGH (ref 65–99)
Glucose-Capillary: 117 mg/dL — ABNORMAL HIGH (ref 65–99)
Glucose-Capillary: 139 mg/dL — ABNORMAL HIGH (ref 65–99)

## 2014-09-18 LAB — BASIC METABOLIC PANEL
Anion gap: 7 (ref 5–15)
BUN: 22 mg/dL — ABNORMAL HIGH (ref 6–20)
CALCIUM: 7.6 mg/dL — AB (ref 8.9–10.3)
CO2: 24 mmol/L (ref 22–32)
CREATININE: 1.33 mg/dL — AB (ref 0.44–1.00)
Chloride: 111 mmol/L (ref 101–111)
GFR calc Af Amer: 46 mL/min — ABNORMAL LOW (ref >60)
GFR calc non Af Amer: 40 mL/min — ABNORMAL LOW (ref >60)
Glucose, Bld: 82 mg/dL (ref 65–99)
Potassium: 3.5 mmol/L (ref 3.5–5.1)
Sodium: 142 mmol/L (ref 135–145)

## 2014-09-18 LAB — TROPONIN I: Troponin I: 0.14 ng/mL — ABNORMAL HIGH (ref <0.031)

## 2014-09-18 LAB — HEMOGLOBIN A1C: HEMOGLOBIN A1C: 4.4 % (ref 4.0–6.0)

## 2014-09-18 LAB — MAGNESIUM
MAGNESIUM: 2 mg/dL (ref 1.7–2.4)
Magnesium: 2.4 mg/dL (ref 1.7–2.4)

## 2014-09-18 LAB — PHOSPHORUS: Phosphorus: 4.5 mg/dL (ref 2.5–4.6)

## 2014-09-18 MED ORDER — CEFTRIAXONE SODIUM IN DEXTROSE 40 MG/ML IV SOLN: 2.0000 g | INTRAVENOUS | Status: DC

## 2014-09-18 MED ORDER — MENTHOL 3 MG MT LOZG
1.0000 | LOZENGE | OROMUCOSAL | Status: DC | PRN
  Administered 2014-09-18: 3 mg via ORAL
  Filled 2014-09-18: qty 9

## 2014-09-18 MED ORDER — GLUCERNA SHAKE PO LIQD
237.0000 mL | Freq: Two times a day (BID) | ORAL | Status: DC
  Administered 2014-09-18 – 2014-09-20 (×4): 237 mL via ORAL

## 2014-09-18 MED ORDER — GLUCERNA SHAKE PO LIQD: 237.0000 mL | Freq: Three times a day (TID) | ORAL | Status: DC

## 2014-09-18 MED ORDER — ALPRAZOLAM 0.25 MG PO TABS: 0.2500 mg | ORAL_TABLET | Freq: Three times a day (TID) | ORAL | Status: DC | PRN

## 2014-09-18 MED ORDER — PHENOL 1.4 % MT LIQD
1.0000 | OROMUCOSAL | Status: DC | PRN
  Administered 2014-09-18 – 2014-09-19 (×2): 1 via OROMUCOSAL
  Filled 2014-09-18: qty 177

## 2014-09-18 MED ORDER — CEFTRIAXONE SODIUM IN DEXTROSE 20 MG/ML IV SOLN
1.0000 g | INTRAVENOUS | Status: DC
  Administered 2014-09-18 – 2014-09-19 (×2): 1 g via INTRAVENOUS
  Filled 2014-09-18 (×4): qty 50

## 2014-09-18 NOTE — Progress Notes (Signed)
Initial Nutrition Assessment  DOCUMENTATION CODES:     INTERVENTION:   (Medical Nutrition Supplement: Recommend adding glucerna BID for added nutrition)  NUTRITION DIAGNOSIS:  Inadequate oral intake related to other (see comment) (poor po intake) as evidenced by other (see comment) (24% weight loss over the last year).  GOAL:  Patient will meet greater than or equal to 90% of their needs    MONITOR:   (Energy intake, Electrolyte and renal profile)  REASON FOR ASSESSMENT:  Malnutrition Screening Tool    ASSESSMENT:  Pt admitted with malignant HTN, CHF with pleural effusions  Past Medical History  Diagnosis Date  . Hypertension   . Diabetes mellitus without complication    Pt reports poor po intake over the last 2 weeks,eating 50% or less of normal intake.  No appetite or taste for food Ate few bites of pasta and meat sauce for lunch, tray observed  Labs; Electrolyte and Renal Profile:    Recent Labs Lab 09/17/14 0827 09/17/14 1410 09/18/14 0446  BUN 24*  --  22*  CREATININE 1.53*  --  1.33*  NA 139  --  142  K 3.8  --  3.5  MG  --  2.4 2.0  PHOS  --   --  4.5    Glucose Profile:  Recent Labs  09/18/14 0734 09/18/14 1214 09/18/14 1224  GLUCAP 89 113* 105*   Medications: colace, lasix, aspart, senna  Height:  Ht Readings from Last 1 Encounters:  09/17/14 5\' 7"  (1.702 m)    Weight:  Wt Readings from Last 1 Encounters:  09/18/14 147 lb 0.8 oz (66.7 kg)      Pt reports 23% weight loss in the last year (200s to 140s). Thinks some of weight loss could have been fluid loss.  Wt Readings from Last 10 Encounters:  09/18/14 147 lb 0.8 oz (66.7 kg)  02/01/14 171 lb (77.565 kg)  07/18/10 218 lb 6.1 oz (99.057 kg)  06/20/10 221 lb 6.1 oz (100.418 kg)  06/13/10 222 lb 8 oz (100.925 kg)  05/30/10 220 lb (99.791 kg)  04/15/10 221 lb 2.1 oz (100.304 kg)  02/05/10 218 lb 6.1 oz (99.057 kg)   Nutrition-Focused physical exam completed. Findings  are no fat depletion, no muscle depletion, and no edema.    BMI:  Body mass index is 23.03 kg/(m^2).  Estimated Nutritional Needs:  Kcal:  8110-3159 kcals/d BEE 1227 kcals (IF 1.1-1.3, AF 1.3) Using actual wt of 67kg  Protein:  (1.0-1.3 gm/kg) 67-87 g/d  Fluid:  1675-2071ml/d (25-15ml/kg)   Skin:     Diet Order:  Diet Carb Modified Fluid consistency:: Thin; Room service appropriate?: Yes  EDUCATION NEEDS:  No education needs identified at this time   Intake/Output Summary (Last 24 hours) at 09/18/14 1402 Last data filed at 09/18/14 1152  Gross per 24 hour  Intake    270 ml  Output   2125 ml  Net  -1855 ml    MODERATE Care Level [place] Somma B. Zenia Resides, [place], [place] (pager)

## 2014-09-18 NOTE — Progress Notes (Signed)
Pt requesting something for a sore throat, MD paged, Dr. Jannifer Franklin, MD to put in orders for lozenges & chloraseptic spray. Conley Simmonds, RN

## 2014-09-18 NOTE — Progress Notes (Signed)
[facility] NOTE  Pharmacy Consult for Electrolyte Monitoring.   Indication: ICU Status   Allergies  Allergen Reactions  . Codeine     REACTION: hallucinations    Patient Measurements: Height: 5\' 7"  (170.2 cm) Weight: 153 lb (69.4 kg) IBW/kg (Calculated) : 61.6   Vital Signs: Temp: 99.9 F (37.7 C) (05/16 1900) Temp Source: Oral (05/16 1600) BP: 136/67 mmHg (05/16 2300) Pulse Rate: 121 (05/16 2300) Intake/Output from previous day: 05/16 0701 - 05/17 0700 In: -  Out: 2125 [Urine:2125] Intake/Output from this shift: Total I/O In: -  Out: 250 [Urine:250] Vent settings for last 24 hours:    Labs:  Recent Labs  09/17/14 0827  WBC 3.2*  HGB 8.9*  HCT 27.6*  PLT 302  CREATININE 1.53*  ALBUMIN 2.8*  PROT 6.7  AST 29  ALT 17  ALKPHOS 68  BILITOT 0.6   Estimated Creatinine Clearance: 33.7 mL/min (by C-G formula based on Cr of 1.53).   Recent Labs  09/17/14 1543 09/17/14 2041  GLUCAP 88 83    Microbiology: No results found for this or any previous visit (from the past 720 hour(s)).  Medications:  Scheduled:  . amLODipine  10 mg Oral Daily  . aspirin  81 mg Oral Daily  . azithromycin  250 mg Oral Daily  . diclofenac  50 mg Oral Q lunch  . docusate sodium  100 mg Oral BID  . ferrous sulfate  325 mg Oral Daily  . furosemide  40 mg Oral BID  . glimepiride  1 mg Oral Q breakfast  . heparin  5,000 Units Subcutaneous 3 times per day  . hydrALAZINE  100 mg Oral TID  . insulin aspart  0-15 Units Subcutaneous TID WC  . insulin aspart  0-5 Units Subcutaneous QHS  . insulin aspart  3 Units Subcutaneous TID WC  . isosorbide dinitrate  30 mg Oral BID  . labetalol  100 mg Oral 4 times per day  . meloxicam  7.5 mg Oral BID WC  . nitroGLYCERIN  1 inch Topical 4 times per day  . omega-3 acid ethyl esters  1 g Oral Daily  . pantoprazole  40 mg Oral Daily  . potassium chloride  20 mEq Oral Daily  . ramipril  5 mg Oral Daily  . senna  1  tablet Oral BID  . simvastatin  20 mg Oral QPM  . sodium chloride  3 mL Intravenous Q12H  . sodium chloride  3 mL Intravenous Q12H   Infusions:    Assessment: 69 yo female admitted with malignant essential hypertension to ICU.    Plan:  Electrolytes WNL. Will continue to monitor daily while in ICU and will replace per protocol.   Simpson,Michael L 09/18/2014,12:08 AM

## 2014-09-18 NOTE — Progress Notes (Signed)
[facility] PROGRESS NOTE  [name]:[id number] DOB: 19-Apr-1946 DOA: 09/17/2014 PCP: Morton Peters, MD  HPI/Subjective: Patient complains of anxiety with hand shaking while she is trying to read something. She complains of sore throat. No difficulty swallowing. She was short of breath when she came in. Now breathing better. Some cough.  Objective: Filed Vitals:   09/18/14 1152  BP:   Pulse:   Temp: 101.4 F (38.6 C)  Resp:     Intake/Output Summary (Last 24 hours) at 09/18/14 1337 Last data filed at 09/18/14 1152  Gross per 24 hour  Intake    270 ml  Output   2125 ml  Net  -1855 ml   Filed Weights   09/17/14 0818 09/18/14 0557  Weight: 69.4 kg (153 lb) 66.7 kg (147 lb 0.8 oz)    ROS: Review of Systems  Constitutional: Negative for fever and chills.  Eyes: Negative for blurred vision.  Respiratory: Positive for cough and shortness of breath.   Cardiovascular: Negative for chest pain.  Gastrointestinal: Negative for nausea, vomiting, abdominal pain, diarrhea and constipation.  Genitourinary: Negative for dysuria.  Musculoskeletal: Negative for joint pain.  Neurological: Negative for dizziness and headaches.   Exam: Physical Exam  HENT:  Nose: No mucosal edema.  Mouth/Throat: No oropharyngeal exudate or posterior oropharyngeal edema.  Eyes: Conjunctivae, EOM and lids are normal. Pupils are equal, round, and reactive to light.  Neck: No JVD present. Carotid bruit is not present. No edema present. No thyroid mass and no thyromegaly present.  Cardiovascular: S1 normal and S2 normal.  Exam reveals no gallop.   No murmur heard. Pulses:      Dorsalis pedis pulses are 2+ on the right side, and 2+ on the left side.  Respiratory: No respiratory distress. She has decreased breath sounds in the right lower field and the left lower field. She has no wheezes. She has no rhonchi. She has rales in the right lower field and the left lower field.  GI:  Soft. Bowel sounds are normal. There is no tenderness.  Lymphadenopathy:    She has no cervical adenopathy.  Neurological: She is alert. No cranial nerve deficit.  Skin: Skin is warm. No rash noted. Nails show no clubbing.  Psychiatric: She has a normal mood and affect.    Data Reviewed: Basic Metabolic Panel:  Recent Labs Lab 09/17/14 0827 09/17/14 1410 09/18/14 0446  NA 139  --  142  K 3.8  --  3.5  CL 107  --  111  CO2 24  --  24  GLUCOSE 103*  --  82  BUN 24*  --  22*  CREATININE 1.53*  --  1.33*  CALCIUM 7.8*  --  7.6*  MG  --  2.4 2.0  PHOS  --   --  4.5   Liver Function Tests:  Recent Labs Lab 09/17/14 0827  AST 29  ALT 17  ALKPHOS 68  BILITOT 0.6  PROT 6.7  ALBUMIN 2.8*    CBC:  Recent Labs Lab 09/17/14 0827  WBC 3.2*  NEUTROABS 2.5  HGB 8.9*  HCT 27.6*  MCV 78.3*  PLT 302   Cardiac Enzymes:  Recent Labs Lab 09/17/14 1410 09/17/14 1656 09/17/14 1953 09/18/14 0446  TROPONINI 0.07* 0.09* 0.11* 0.14*   BNP (last 3 results)  Recent Labs  09/17/14 1310 09/17/14 1410  BNP 1358.0* 1525.0*    CBG:  Recent Labs Lab 09/17/14 1543 09/17/14 2041 09/18/14 0734 09/18/14 1214 09/18/14  [place] 83 89 113* 105*    Recent Results (from the past 240 hour(s))  Culture, blood (routine x 2)     Status: None (Preliminary result)   Collection Time: 09/17/14 11:20 AM  Result Value Ref Range Status   Specimen Description BLOOD  Final   Special Requests LEFT ARM  Final   Culture NO GROWTH < 24 HOURS  Final   Report Status PENDING  Incomplete  Culture, blood (routine x 2)     Status: None (Preliminary result)   Collection Time: 09/17/14 11:20 AM  Result Value Ref Range Status   Specimen Description BLOOD  Final   Special Requests RIGHT AC  Final   Culture NO GROWTH < 24 HOURS  Final   Report Status PENDING  Incomplete     Studies: Dg Chest 2 View  09/17/2014   CLINICAL DATA:  Right side chest pain beginning 09/16/2014. Shortness  of breath.  EXAM: CHEST  2 VIEW  COMPARISON:  PA and lateral chest 07/21/2014.  FINDINGS: There is cardiomegaly. Since the prior study, the patient has developed small to moderate pleural effusions and basilar airspace disease, worse on the right. No pneumothorax is identified. There is no evidence of pulmonary edema. Degenerative change is seen about the shoulders.  IMPRESSION: New small to moderate pleural effusions and basilar airspace disease, worse on the right.  Cardiomegaly without edema.   Electronically Signed   By: Inge Rise M.D.   On: 09/17/2014 10:07   US Renal  09/17/2014   CLINICAL DATA:  Chronic kidney disease, hypertension, diabetes mellitus  EXAM: RENAL / URINARY TRACT ULTRASOUND COMPLETE  COMPARISON:  CT abdomen and pelvis 04/06/2009  FINDINGS: Right Kidney:  Length: 10.1 cm. Normal cortical thickness. Increased cortical echogenicity. No mass, hydronephrosis or shadowing calcification.  Left Kidney:  Length: 12.0 cm. Normal cortical thickness. Increased cortical echogenicity. Echogenic nodule mid LEFT kidney 12 x 11 x 18 mm question angiomyolipoma present on prior CT. No additional renal mass, hydronephrosis or shadowing calcification.  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  Medical renal disease changes of both kidneys.  No additional renal mass or hydronephrosis.   Electronically Signed   By: Lavonia Dana M.D.   On: 09/17/2014 19:32    Scheduled Meds: . amLODipine  10 mg Oral Daily  . aspirin  81 mg Oral Daily  . azithromycin  250 mg Oral Daily  . cefTRIAXone (ROCEPHIN)  IV  1 g Intravenous Q24H  . diclofenac  50 mg Oral Q lunch  . docusate sodium  100 mg Oral BID  . ferrous sulfate  325 mg Oral Daily  . furosemide  40 mg Oral BID  . glimepiride  1 mg Oral Q breakfast  . heparin  5,000 Units Subcutaneous 3 times per day  . hydrALAZINE  100 mg Oral TID  . insulin aspart  0-15 Units Subcutaneous TID WC  . insulin aspart   0-5 Units Subcutaneous QHS  . insulin aspart  3 Units Subcutaneous TID WC  . isosorbide dinitrate  30 mg Oral BID  . labetalol  100 mg Oral 4 times per day  . meloxicam  7.5 mg Oral BID WC  . nitroGLYCERIN  1 inch Topical 4 times per day  . omega-3 acid ethyl esters  1 g Oral Daily  . pantoprazole  40 mg Oral Daily  . potassium chloride  20 mEq Oral Daily  . ramipril  5  mg Oral Daily  . senna  1 tablet Oral BID  . simvastatin  20 mg Oral QPM  . sodium chloride  3 mL Intravenous Q12H  . sodium chloride  3 mL Intravenous Q12H   Continuous Infusions:   Assessment/Plan:   1. Accelerated hypertension- blood pressure much improved. 2.   Acute congestive heart failure unspecified type, with pleural effusions- patient switched over to oral Lasix 40 mg twice a day. Continue ramipril and labetalol. 3.   Pneumonia, community-acquired right lower lobe-add Rocephin to Zithromax. 4. Chronic kidney disease stage III continue to monitor with diuresis. 5. Diabetes type 2 controlled continue usual medications. 6. Hyperlipidemia unspecified continue simvastatin. 7. Gastroesophageal reflux disease without esophagitis continue Protonix 8. Transfer out of ICU 9. Physical therapy consultation    Code Status:     Code Status Orders        Start     Ordered   09/17/14 1400  Full code   Continuous     09/17/14 1400     Family Communication: Family at bedside Disposition Plan: Home  Time spent: 6 minutes  Loletha Grayer  [facility]

## 2014-09-18 NOTE — Progress Notes (Signed)
[facility] Encounter Note  Patient: [name] / Admit Date: 09/17/2014 / Date of Encounter: 09/18/2014, 12:33 PM   Subjective: Short of breath with less chest pain but still somewhat weak  Review of Systems: Positive for: Shortness of breath Negative for: Vision change, hearing change, syncope, dizziness, nausea, vomiting,diarrhea, bloody stool, stomach pain, cough, congestion, diaphoresis, urinary frequency, urinary pain,skin lesions, skin rashes Others previously listed  Objective: Telemetry: Normal sinus rhythm with pre-atrial and preventricular contractions Physical Exam: Blood pressure 157/64, pulse 89, temperature 101.4 F (38.6 C), temperature source Oral, resp. rate 19, height 5\' 7"  (1.702 m), weight 147 lb 0.8 oz (66.7 kg), SpO2 96 %. Body mass index is 23.03 kg/(m^2). General: Well developed, well nourished, in no acute distress. Head: Normocephalic, atraumatic, sclera non-icteric, no xanthomas, nares are without discharge. Neck: No apparent masses Lungs: Normal respirations with no wheezes, no rhonchi, no rales , few basilar crackles   Heart: Regular rate and rhythm, normal S1 S2, no murmur, no rub, no gallop, PMI is normal size and placement, carotid upstroke normal without bruit, jugular venous pressure normal Abdomen: Soft, non-tender, non-distended with normoactive bowel sounds. No hepatosplenomegaly. Abdominal aorta is normal size without bruit Extremities: Trace to 1+ edema, no clubbing, no cyanosis, no ulcers,  Peripheral: 2+ radial, 2+ femoral, 2+ dorsal pedal pulses Neuro: Alert and oriented. Moves all extremities spontaneously. Psych:  Responds to questions appropriately with a normal affect.   Intake/Output Summary (Last 24 hours) at 09/18/14 1233 Last data filed at 09/18/14 1152  Gross per 24 hour  Intake    270 ml  Output   2125 ml  Net  -1855 ml    Inpatient Medications:  . amLODipine  10 mg Oral Daily  . aspirin  81 mg Oral  Daily  . azithromycin  250 mg Oral Daily  . diclofenac  50 mg Oral Q lunch  . docusate sodium  100 mg Oral BID  . ferrous sulfate  325 mg Oral Daily  . furosemide  40 mg Oral BID  . glimepiride  1 mg Oral Q breakfast  . heparin  5,000 Units Subcutaneous 3 times per day  . hydrALAZINE  100 mg Oral TID  . insulin aspart  0-15 Units Subcutaneous TID WC  . insulin aspart  0-5 Units Subcutaneous QHS  . insulin aspart  3 Units Subcutaneous TID WC  . isosorbide dinitrate  30 mg Oral BID  . labetalol  100 mg Oral 4 times per day  . meloxicam  7.5 mg Oral BID WC  . nitroGLYCERIN  1 inch Topical 4 times per day  . omega-3 acid ethyl esters  1 g Oral Daily  . pantoprazole  40 mg Oral Daily  . potassium chloride  20 mEq Oral Daily  . ramipril  5 mg Oral Daily  . senna  1 tablet Oral BID  . simvastatin  20 mg Oral QPM  . sodium chloride  3 mL Intravenous Q12H  . sodium chloride  3 mL Intravenous Q12H   Infusions:    Labs:  Recent Labs  09/17/14 0827 09/17/14 1410 09/18/14 0446  NA 139  --  142  K 3.8  --  3.5  CL 107  --  111  CO2 24  --  24  GLUCOSE 103*  --  82  BUN 24*  --  22*  CREATININE 1.53*  --  1.33*  CALCIUM 7.8*  --  7.6*  MG  --  2.4 2.0  PHOS  --   --  4.5    Recent Labs  09/17/14 0827  AST 29  ALT 17  ALKPHOS 68  BILITOT 0.6  PROT 6.7  ALBUMIN 2.8*    Recent Labs  09/17/14 0827  WBC 3.2*  NEUTROABS 2.5  HGB 8.9*  HCT 27.6*  MCV 78.3*  PLT 302    Recent Labs  09/17/14 1410 09/17/14 1656 09/17/14 1953 09/18/14 0446  TROPONINI 0.07* 0.09* 0.11* 0.14*   Invalid input(s): POCBNP No results for input(s): HGBA1C in the last 72 hours.   Weights: Filed Weights   09/17/14 0818 09/18/14 0557  Weight: 153 lb (69.4 kg) 147 lb 0.8 oz (66.7 kg)     Radiology/Studies:  Dg Chest 2 View  09/17/2014   CLINICAL DATA:  Right side chest pain beginning 09/16/2014. Shortness of breath.  EXAM: CHEST  2 VIEW  COMPARISON:  PA and lateral chest 07/21/2014.   FINDINGS: There is cardiomegaly. Since the prior study, the patient has developed small to moderate pleural effusions and basilar airspace disease, worse on the right. No pneumothorax is identified. There is no evidence of pulmonary edema. Degenerative change is seen about the shoulders.  IMPRESSION: New small to moderate pleural effusions and basilar airspace disease, worse on the right.  Cardiomegaly without edema.   Electronically Signed   By: Inge Rise M.D.   On: 09/17/2014 10:07   US Renal  09/17/2014   CLINICAL DATA:  Chronic kidney disease, hypertension, diabetes mellitus  EXAM: RENAL / URINARY TRACT ULTRASOUND COMPLETE  COMPARISON:  CT abdomen and pelvis 04/06/2009  FINDINGS: Right Kidney:  Length: 10.1 cm. Normal cortical thickness. Increased cortical echogenicity. No mass, hydronephrosis or shadowing calcification.  Left Kidney:  Length: 12.0 cm. Normal cortical thickness. Increased cortical echogenicity. Echogenic nodule mid LEFT kidney 12 x 11 x 18 mm question angiomyolipoma present on prior CT. No additional renal mass, hydronephrosis or shadowing calcification.  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  Medical renal disease changes of both kidneys.  No additional renal mass or hydronephrosis.   Electronically Signed   By: Lavonia Dana M.D.   On: 09/17/2014 19:32     Assessment and Recommendation  69 y.o. female with essential hypertension diabetes with complication chronic kidney disease stage III anemia with pleural effusions consistent with acute diastolic dysfunction congestive heart failure. The patient has had an elevated troponin most consistent with demand ischemia and no current evidence of myocardial infarction 1. Continue Lasix for pleural effusions as well as edema watching closely for worsening chronic kidney disease 2. Hypertension control with additional medications as already given today including beta blocker for better  heart rate control ACE inhibitor for renal protection and hypertension control 3. No further cardiac intervention of minimal elevation of troponin most consistent with demand ischemia 4. Okay for change to telemetry unit with ambulation following for any in adjustments of medication management Exline 5. No further cardiac diagnostics necessary at this time  Signed, Serafina Royals M.D. FACC

## 2014-09-18 NOTE — Progress Notes (Signed)
Subjective: Pt well known to [place] from the office, presented with right sided chest pain. We follow her for CKD stage 3 baseline Cr 1.1.   Objective: Vital signs in last 24 hours: Temp:  [98.7 F (37.1 C)-100.8 F (38.2 C)] 100.8 F (38.2 C) (05/17 0800) Pulse Rate:  [76-131] 80 (05/17 0821) Resp:  [11-30] 21 (05/17 0821) BP: (108-200)/(57-129) 168/64 mmHg (05/17 0821) SpO2:  [93 %-99 %] 97 % (05/17 0821) Weight:  [66.7 kg (147 lb 0.8 oz)] 66.7 kg (147 lb 0.8 oz) (05/17 0557) Weight change:   Intake/Output from previous day:  Intake/Output Summary (Last 24 hours) at 09/18/14 0847 Last data filed at 09/17/14 2100  Gross per 24 hour  Intake      0 ml  Output   2125 ml  Net  -2125 ml    Intake/Output this shift:    General appearance: NAD HEENT: Atraumatic; EOMI, hearing intact, OM moist Neck: Trachea midline; supple Lungs: basilar rales, with normal respiratory effort  CV: S1S2, no rubs Abdomen: Soft, NTND, BS present Extremities:trace+ b/l LE edema Skin: Warm and dry, no rashes noted. Neuro/Psych: Awake, alert, following commands, normal mood  Lab Results: Results for orders placed or performed during the hospital encounter of 09/17/14 (from the past 24 hour(s))  Lactic acid, plasma     Status: None   Collection Time: 09/17/14 11:20 AM  Result Value Ref Range   Lactic Acid, Venous 1.0 0.5 - 2.0 mmol/L  Brain natriuretic peptide     Status: Abnormal   Collection Time: 09/17/14  1:10 PM  Result Value Ref Range   B Natriuretic Peptide 1358.0 (H) 0.0 - 100.0 pg/mL  Lactic acid, plasma     Status: None   Collection Time: 09/17/14  2:10 PM  Result Value Ref Range   Lactic Acid, Venous 0.9 0.5 - 2.0 mmol/L  Magnesium     Status: None   Collection Time: 09/17/14  2:10 PM  Result Value Ref Range   Magnesium 2.4 1.7 - 2.4 mg/dL  Troponin I     Status: Abnormal   Collection Time: 09/17/14  2:10 PM  Result Value Ref Range   Troponin I 0.07 (H) <0.031 ng/mL  Brain  natriuretic peptide     Status: Abnormal   Collection Time: 09/17/14  2:10 PM  Result Value Ref Range   B Natriuretic Peptide 1525.0 (H) 0.0 - 100.0 pg/mL  Glucose, capillary     Status: None   Collection Time: 09/17/14  3:43 PM  Result Value Ref Range   Glucose-Capillary 88 65 - 99 mg/dL  Troponin I     Status: Abnormal   Collection Time: 09/17/14  4:56 PM  Result Value Ref Range   Troponin I 0.09 (H) <0.031 ng/mL  TSH     Status: None   Collection Time: 09/17/14  4:56 PM  Result Value Ref Range   TSH 1.204 0.350 - 4.500 uIU/mL  Troponin I     Status: Abnormal   Collection Time: 09/17/14  7:53 PM  Result Value Ref Range   Troponin I 0.11 (H) <0.031 ng/mL  Glucose, capillary     Status: None   Collection Time: 09/17/14  8:41 PM  Result Value Ref Range   Glucose-Capillary 83 65 - 99 mg/dL  Troponin I     Status: Abnormal   Collection Time: 09/18/14  4:46 AM  Result Value Ref Range   Troponin I 0.14 (H) <0.031 ng/mL  Basic metabolic panel     Status:  Abnormal   Collection Time: 09/18/14  4:46 AM  Result Value Ref Range   Sodium 142 135 - 145 mmol/L   Potassium 3.5 3.5 - 5.1 mmol/L   Chloride 111 101 - 111 mmol/L   CO2 24 22 - 32 mmol/L   Glucose, Bld 82 65 - 99 mg/dL   BUN 22 (H) 6 - 20 mg/dL   Creatinine, Ser 1.33 (H) 0.44 - 1.00 mg/dL   Calcium 7.6 (L) 8.9 - 10.3 mg/dL   GFR calc non Af Amer 40 (L) >60 mL/min   GFR calc Af Amer 46 (L) >60 mL/min   Anion gap 7 5 - 15  Magnesium     Status: None   Collection Time: 09/18/14  4:46 AM  Result Value Ref Range   Magnesium 2.0 1.7 - 2.4 mg/dL  Phosphorus     Status: None   Collection Time: 09/18/14  4:46 AM  Result Value Ref Range   Phosphorus 4.5 2.5 - 4.6 mg/dL  Glucose, capillary     Status: None   Collection Time: 09/18/14  7:34 AM  Result Value Ref Range   Glucose-Capillary 89 65 - 99 mg/dL    Studies/Results: Dg Chest 2 View  09/17/2014   CLINICAL DATA:  Right side chest pain beginning 09/16/2014. Shortness of  breath.  EXAM: CHEST  2 VIEW  COMPARISON:  PA and lateral chest 07/21/2014.  FINDINGS: There is cardiomegaly. Since the prior study, the patient has developed small to moderate pleural effusions and basilar airspace disease, worse on the right. No pneumothorax is identified. There is no evidence of pulmonary edema. Degenerative change is seen about the shoulders.  IMPRESSION: New small to moderate pleural effusions and basilar airspace disease, worse on the right.  Cardiomegaly without edema.   Electronically Signed   By: Inge Rise M.D.   On: 09/17/2014 10:07   US Renal  09/17/2014   CLINICAL DATA:  Chronic kidney disease, hypertension, diabetes mellitus  EXAM: RENAL / URINARY TRACT ULTRASOUND COMPLETE  COMPARISON:  CT abdomen and pelvis 04/06/2009  FINDINGS: Right Kidney:  Length: 10.1 cm. Normal cortical thickness. Increased cortical echogenicity. No mass, hydronephrosis or shadowing calcification.  Left Kidney:  Length: 12.0 cm. Normal cortical thickness. Increased cortical echogenicity. Echogenic nodule mid LEFT kidney 12 x 11 x 18 mm question angiomyolipoma present on prior CT. No additional renal mass, hydronephrosis or shadowing calcification.  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  Medical renal disease changes of both kidneys.  No additional renal mass or hydronephrosis.   Electronically Signed   By: Lavonia Dana M.D.   On: 09/17/2014 19:32    . amLODipine  10 mg Oral Daily  . aspirin  81 mg Oral Daily  . azithromycin  250 mg Oral Daily  . diclofenac  50 mg Oral Q lunch  . docusate sodium  100 mg Oral BID  . ferrous sulfate  325 mg Oral Daily  . furosemide  40 mg Oral BID  . glimepiride  1 mg Oral Q breakfast  . heparin  5,000 Units Subcutaneous 3 times per day  . hydrALAZINE  100 mg Oral TID  . insulin aspart  0-15 Units Subcutaneous TID WC  . insulin aspart  0-5 Units Subcutaneous QHS  . insulin aspart  3 Units Subcutaneous TID  WC  . isosorbide dinitrate  30 mg Oral BID  . labetalol  100 mg Oral 4 times per day  . meloxicam  7.5 mg Oral BID WC  . nitroGLYCERIN  1 inch Topical 4 times per day  . omega-3 acid ethyl esters  1 g Oral Daily  . pantoprazole  40 mg Oral Daily  . potassium chloride  20 mEq Oral Daily  . ramipril  5 mg Oral Daily  . senna  1 tablet Oral BID  . simvastatin  20 mg Oral QPM  . sodium chloride  3 mL Intravenous Q12H  . sodium chloride  3 mL Intravenous Q12H    Assessment/Plan: 69 year old African American female with past medical history of diabetes mellitus, proteinuria, diabetic retinopathy, hypertension, congestive heart failure, coronary disease status post PCI, left renal angiomyolipoma, and hepatic hemangioma, right rib fracture, proteinuria and CKD stage 3.  1.  CKD stage 3:  Overall renal function appears stable at this time, continue ramipril for now given stability of renal function. 2.  Hypertension:  BP fluctuating, reassess BP after morning meds given, for now continue amlodipine, hydralazine, labetalol, and ramipril. 3.  Anemia of CKD: hgb 8.9, will consider epogen once BP under better control. 4.  Lower extremity edema: continue lasix 40mg  po bid.       LOS: 1 day   [name] 09/18/2014,8:47 AM

## 2014-09-19 LAB — BASIC METABOLIC PANEL
Anion gap: 5 (ref 5–15)
BUN: 25 mg/dL — AB (ref 6–20)
CALCIUM: 7.6 mg/dL — AB (ref 8.9–10.3)
CHLORIDE: 112 mmol/L — AB (ref 101–111)
CO2: 25 mmol/L (ref 22–32)
CREATININE: 1.43 mg/dL — AB (ref 0.44–1.00)
GFR calc Af Amer: 42 mL/min — ABNORMAL LOW (ref >60)
GFR, EST NON AFRICAN AMERICAN: 36 mL/min — AB (ref >60)
Glucose, Bld: 109 mg/dL — ABNORMAL HIGH (ref 65–99)
POTASSIUM: 3.9 mmol/L (ref 3.5–5.1)
SODIUM: 142 mmol/L (ref 135–145)

## 2014-09-19 LAB — GLUCOSE, CAPILLARY
GLUCOSE-CAPILLARY: 103 mg/dL — AB (ref 65–99)
GLUCOSE-CAPILLARY: 90 mg/dL (ref 65–99)
Glucose-Capillary: 98 mg/dL (ref 65–99)
Glucose-Capillary: 120 mg/dL — ABNORMAL HIGH (ref 65–99)
Glucose-Capillary: 66 mg/dL (ref 65–99)

## 2014-09-19 LAB — PHOSPHORUS: Phosphorus: 3.7 mg/dL (ref 2.5–4.6)

## 2014-09-19 LAB — MAGNESIUM: Magnesium: 2.1 mg/dL (ref 1.7–2.4)

## 2014-09-19 MED ORDER — LACTULOSE 10 GM/15ML PO SOLN
30.0000 g | Freq: Once | ORAL | Status: AC
  Administered 2014-09-19: 30 g via ORAL
  Filled 2014-09-19: qty 60

## 2014-09-19 MED ORDER — LABETALOL HCL 200 MG PO TABS
200.0000 mg | ORAL_TABLET | Freq: Two times a day (BID) | ORAL | Status: DC
  Administered 2014-09-19 – 2014-09-20 (×2): 200 mg via ORAL
  Filled 2014-09-19 (×2): qty 1

## 2014-09-19 NOTE — Progress Notes (Signed)
PT Cancellation Note  Patient Details Name: [name] MRN: [id number] DOB: 1946/04/24   Cancelled Treatment:    Reason Eval/Treat Not Completed: Medical issues which prohibited therapy.  Elevated troponin, low O2 sats, low Ca, elevated creatinine.  Recheck later in the day.   Ramond Dial 09/19/2014, 11:43 AM   Mee Hives, PT MS Acute Rehab Dept. Number: [facility] [id number] and [place] [phone]

## 2014-09-19 NOTE — Progress Notes (Signed)
Patient ID: [name], female   DOB: Apr 11, 1946, 69 y.o.   MRN: [id number] [facility] PROGRESS NOTE  PCP: Morton Peters, MD  HPI/Subjective: Patient sore throat is better today. Breathing better. Feeling a little bit weak. Poor appetite. Positive for constipation asking for something to have a bowel movement. Some pain in the jaw when opening mouth.  Objective: Filed Vitals:   09/19/14 1038  BP: 149/72  Pulse: 78  Temp: 97.6 F (36.4 C)  Resp: 17    Intake/Output Summary (Last 24 hours) at 09/19/14 1111 Last data filed at 09/19/14 0814  Gross per 24 hour  Intake    803 ml  Output    300 ml  Net    503 ml   Filed Weights   09/17/14 0818 09/18/14 0557 09/19/14 0458  Weight: 69.4 kg (153 lb) 66.7 kg (147 lb 0.8 oz) 72.394 kg (159 lb 9.6 oz)    ROS: Review of Systems  Constitutional: Negative for fever and chills.  HENT: Positive for sore throat.   Eyes: Negative for blurred vision.  Respiratory: Positive for cough and shortness of breath.   Cardiovascular: Negative for chest pain.  Gastrointestinal: Positive for constipation. Negative for nausea, vomiting, abdominal pain and diarrhea.  Genitourinary: Negative for dysuria.  Musculoskeletal: Negative for joint pain.  Neurological: Positive for weakness. Negative for dizziness and headaches.   Exam: Physical Exam  HENT:  Nose: No mucosal edema.  Mouth/Throat: No oropharyngeal exudate or posterior oropharyngeal edema.  Eyes: Conjunctivae, EOM and lids are normal. Pupils are equal, round, and reactive to light.  Neck: No JVD present. Carotid bruit is not present. No edema present. No thyroid mass and no thyromegaly present.  Cardiovascular: S1 normal and S2 normal.  Exam reveals no gallop.   No murmur heard. Pulses:      Dorsalis pedis pulses are 2+ on the right side, and 2+ on the left side.  Respiratory: No respiratory distress. She has decreased breath sounds in the right lower field and  the left lower field. She has no wheezes. She has no rhonchi. She has rales in the right lower field and the left lower field.  GI: Soft. Bowel sounds are normal. There is no tenderness.  Lymphadenopathy:    She has no cervical adenopathy.  Neurological: She is alert. No cranial nerve deficit.  Skin: Skin is warm. No rash noted. Nails show no clubbing.  Psychiatric: She has a normal mood and affect.    Data Reviewed: Basic Metabolic Panel:  Recent Labs Lab 09/17/14 0827 09/17/14 1410 09/18/14 0446 09/19/14 0450  NA 139  --  142 142  K 3.8  --  3.5 3.9  CL 107  --  111 112*  CO2 24  --  24 25  GLUCOSE 103*  --  82 109*  BUN 24*  --  22* 25*  CREATININE 1.53*  --  1.33* 1.43*  CALCIUM 7.8*  --  7.6* 7.6*  MG  --  2.4 2.0 2.1  PHOS  --   --  4.5 3.7   Liver Function Tests:  Recent Labs Lab 09/17/14 0827  AST 29  ALT 17  ALKPHOS 68  BILITOT 0.6  PROT 6.7  ALBUMIN 2.8*    CBC:  Recent Labs Lab 09/17/14 0827  WBC 3.2*  NEUTROABS 2.5  HGB 8.9*  HCT 27.6*  MCV 78.3*  PLT 302   Cardiac Enzymes:  Recent Labs Lab 09/17/14 1410 09/17/14 1656 09/17/14 1953 09/18/14 0446  TROPONINI 0.07* 0.09* 0.11* 0.14*   BNP (last 3 results)  Recent Labs  09/17/14 1310 09/17/14 1410  BNP 1358.0* 1525.0*    CBG:  Recent Labs Lab 09/18/14 1214 09/18/14 1224 09/18/14 1629 09/18/14 2011 09/19/14 0720  GLUCAP 113* 105* 117* 139* 103*    Recent Results (from the past 240 hour(s))  Culture, blood (routine x 2)     Status: None (Preliminary result)   Collection Time: 09/17/14 11:20 AM  Result Value Ref Range Status   Specimen Description BLOOD  Final   Special Requests LEFT ARM  Final   Culture NO GROWTH 2 DAYS  Final   Report Status PENDING  Incomplete  Culture, blood (routine x 2)     Status: None (Preliminary result)   Collection Time: 09/17/14 11:20 AM  Result Value Ref Range Status   Specimen Description BLOOD  Final   Special Requests RIGHT AC  Final    Culture NO GROWTH 2 DAYS  Final   Report Status PENDING  Incomplete     Studies: US Renal  09/17/2014   CLINICAL DATA:  Chronic kidney disease, hypertension, diabetes mellitus  EXAM: RENAL / URINARY TRACT ULTRASOUND COMPLETE  COMPARISON:  CT abdomen and pelvis 04/06/2009  FINDINGS: Right Kidney:  Length: 10.1 cm. Normal cortical thickness. Increased cortical echogenicity. No mass, hydronephrosis or shadowing calcification.  Left Kidney:  Length: 12.0 cm. Normal cortical thickness. Increased cortical echogenicity. Echogenic nodule mid LEFT kidney 12 x 11 x 18 mm question angiomyolipoma present on prior CT. No additional renal mass, hydronephrosis or shadowing calcification.  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  Medical renal disease changes of both kidneys.  No additional renal mass or hydronephrosis.   Electronically Signed   By: Lavonia Dana M.D.   On: 09/17/2014 19:32    Scheduled Meds: . amLODipine  10 mg Oral Daily  . aspirin  81 mg Oral Daily  . azithromycin  250 mg Oral Daily  . cefTRIAXone (ROCEPHIN)  IV  1 g Intravenous Q24H  . docusate sodium  100 mg Oral BID  . feeding supplement (GLUCERNA SHAKE)  237 mL Oral BID BM  . ferrous sulfate  325 mg Oral Daily  . furosemide  40 mg Oral BID  . glimepiride  1 mg Oral Q breakfast  . heparin  5,000 Units Subcutaneous 3 times per day  . hydrALAZINE  100 mg Oral TID  . insulin aspart  0-15 Units Subcutaneous TID WC  . insulin aspart  0-5 Units Subcutaneous QHS  . isosorbide dinitrate  30 mg Oral BID  . labetalol  200 mg Oral BID  . lactulose  30 g Oral Once  . meloxicam  7.5 mg Oral BID WC  . omega-3 acid ethyl esters  1 g Oral Daily  . pantoprazole  40 mg Oral Daily  . potassium chloride  20 mEq Oral Daily  . ramipril  5 mg Oral Daily  . senna  1 tablet Oral BID  . simvastatin  20 mg Oral QPM  . sodium chloride  3 mL Intravenous Q12H   Continuous Infusions:    Assessment/Plan:   1. Clinical sepsis, pneumonia right lower lobe and possibly bilaterally, patient with fever yesterday, tachycardia on admission. Rocephin added yesterday and Zithromax was given on admission. Hemorrhage or curve better today. Potential disposition tomorrow.  2. Accelerated hypertension- blood pressure much improved. I will DC nitroglycerin patch. Change labetalol dosing to twice a day.  3.   Acute congestive heart failure unspecified type, with pleural effusions-      patient switched over to oral Lasix 40 mg twice a day. Continue ramipril and labetalol. Echocardiogram done but not read. 4. Chronic kidney disease stage III continue to monitor with diuresis. 5. Diabetes type 2 controlled continue usual medications. 6. Hyperlipidemia unspecified continue simvastatin. 7. Gastroesophageal reflux disease without esophagitis continue Protonix 8. Physical therapy consultation    Code Status:     Code Status Orders        Start     Ordered   09/17/14 1400  Full code   Continuous     09/17/14 1400     Disposition Plan: Home  Time spent: 20 minutes  Loletha Grayer  [facility]

## 2014-09-19 NOTE — Care Management (Signed)
CM assessed due to length of stay.  Physical therapy not completed this morning due to low 02 sat.  CM spoke with primary nurse regarding a documented 02 sat of 85%.   Patient is not currently on home 02 and is not on 02 at present.  Attending indicates patient will most likely  Discharge within the next 24 hours.  Left message for physical therapy regarding need for assessment so can identify most appropriate discharge disposition.  Patient and her husband deny issues accessing medical care, medications, transportation, utilities, shelter and food.  Confirmed address and contact information.  PCP is Laurian Brim

## 2014-09-19 NOTE — Progress Notes (Signed)
Subjective: Patient resting well. Reports no chest pain.    Objective: Vital signs in last 24 hours: Temp:  [97.9 F (36.6 C)-101.4 F (38.6 C)] 97.9 F (36.6 C) (05/18 0414) Pulse Rate:  [72-91] 81 (05/18 0414) Resp:  [17-26] 21 (05/18 0414) BP: (127-157)/(47-114) 157/54 mmHg (05/18 0414) SpO2:  [92 %-97 %] 92 % (05/18 0414) Weight:  [72.394 kg (159 lb 9.6 oz)] 72.394 kg (159 lb 9.6 oz) (05/18 0458) Weight change: 2.994 kg (6 lb 9.6 oz)  Intake/Output from previous day:  Intake/Output Summary (Last 24 hours) at 09/19/14 1030 Last data filed at 09/19/14 0814  Gross per 24 hour  Intake    803 ml  Output    300 ml  Net    503 ml    Intake/Output this shift: Total I/O In: 240 [P.O.:240] Out: -   General appearance: NAD HEENT: Atraumatic; EOMI, hearing intact, OM moist Neck: Trachea midline; supple Lungs: basilar rales, with normal respiratory effort  CV: S1S2, no rubs Abdomen: Soft, NTND, BS present Extremities: no b/l LE edema Skin: Warm and dry, no rashes noted. Neuro/Psych: Awake, alert, following commands, normal mood  Lab Results: Results for orders placed or performed during the hospital encounter of 09/17/14 (from the past 24 hour(s))  Glucose, capillary     Status: Abnormal   Collection Time: 09/18/14 12:14 PM  Result Value Ref Range   Glucose-Capillary 113 (H) 65 - 99 mg/dL  Glucose, capillary     Status: Abnormal   Collection Time: 09/18/14 12:24 PM  Result Value Ref Range   Glucose-Capillary 105 (H) 65 - 99 mg/dL  Glucose, capillary     Status: Abnormal   Collection Time: 09/18/14  4:29 PM  Result Value Ref Range   Glucose-Capillary 117 (H) 65 - 99 mg/dL  Glucose, capillary     Status: Abnormal   Collection Time: 09/18/14  8:11 PM  Result Value Ref Range   Glucose-Capillary 139 (H) 65 - 99 mg/dL  Basic metabolic panel     Status: Abnormal   Collection Time: 09/19/14  4:50 AM  Result Value Ref Range   Sodium 142 135 - 145 mmol/L   Potassium 3.9  3.5 - 5.1 mmol/L   Chloride 112 (H) 101 - 111 mmol/L   CO2 25 22 - 32 mmol/L   Glucose, Bld 109 (H) 65 - 99 mg/dL   BUN 25 (H) 6 - 20 mg/dL   Creatinine, Ser 1.43 (H) 0.44 - 1.00 mg/dL   Calcium 7.6 (L) 8.9 - 10.3 mg/dL   GFR calc non Af Amer 36 (L) >60 mL/min   GFR calc Af Amer 42 (L) >60 mL/min   Anion gap 5 5 - 15  Magnesium     Status: None   Collection Time: 09/19/14  4:50 AM  Result Value Ref Range   Magnesium 2.1 1.7 - 2.4 mg/dL  Phosphorus     Status: None   Collection Time: 09/19/14  4:50 AM  Result Value Ref Range   Phosphorus 3.7 2.5 - 4.6 mg/dL  Glucose, capillary     Status: Abnormal   Collection Time: 09/19/14  7:20 AM  Result Value Ref Range   Glucose-Capillary 103 (H) 65 - 99 mg/dL    Studies/Results: US Renal  09/17/2014   CLINICAL DATA:  Chronic kidney disease, hypertension, diabetes mellitus  EXAM: RENAL / URINARY TRACT ULTRASOUND COMPLETE  COMPARISON:  CT abdomen and pelvis 04/06/2009  FINDINGS: Right Kidney:  Length: 10.1 cm. Normal cortical thickness. Increased cortical echogenicity.  No mass, hydronephrosis or shadowing calcification.  Left Kidney:  Length: 12.0 cm. Normal cortical thickness. Increased cortical echogenicity. Echogenic nodule mid LEFT kidney 12 x 11 x 18 mm question angiomyolipoma present on prior CT. No additional renal mass, hydronephrosis or shadowing calcification.  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  Medical renal disease changes of both kidneys.  No additional renal mass or hydronephrosis.   Electronically Signed   By: Lavonia Dana M.D.   On: 09/17/2014 19:32    . amLODipine  10 mg Oral Daily  . aspirin  81 mg Oral Daily  . azithromycin  250 mg Oral Daily  . cefTRIAXone (ROCEPHIN)  IV  1 g Intravenous Q24H  . docusate sodium  100 mg Oral BID  . feeding supplement (GLUCERNA SHAKE)  237 mL Oral BID BM  . ferrous sulfate  325 mg Oral Daily  . furosemide  40 mg Oral BID  . glimepiride   1 mg Oral Q breakfast  . heparin  5,000 Units Subcutaneous 3 times per day  . hydrALAZINE  100 mg Oral TID  . insulin aspart  0-15 Units Subcutaneous TID WC  . insulin aspart  0-5 Units Subcutaneous QHS  . isosorbide dinitrate  30 mg Oral BID  . labetalol  100 mg Oral 4 times per day  . meloxicam  7.5 mg Oral BID WC  . nitroGLYCERIN  1 inch Topical 4 times per day  . omega-3 acid ethyl esters  1 g Oral Daily  . pantoprazole  40 mg Oral Daily  . potassium chloride  20 mEq Oral Daily  . ramipril  5 mg Oral Daily  . senna  1 tablet Oral BID  . simvastatin  20 mg Oral QPM  . sodium chloride  3 mL Intravenous Q12H    Assessment/Plan: 69 year old African American female with past medical history of diabetes mellitus, proteinuria, diabetic retinopathy, hypertension, congestive heart failure, coronary disease status post PCI, left renal angiomyolipoma, and hepatic hemangioma, right rib fracture, proteinuria and CKD stage 3.  1.  CKD stage 3:  Overall renal function appears stable at this time, continue ramipril for now given stability of renal function. - Creatinine 1.43 from 1.33 2.  Hypertension:  Elevated blood pressures. continue furosemide, amlodipine, hydralazine, labetalol, and ramipril.  3.  Anemia of CKD: hgb 8.9, needs repeat CBC. Microcytic .  4.  Lower extremity edema: continue furosemide 40mg  po bid.       LOS: 2 days   [name] 09/19/2014,10:30 AM

## 2014-09-19 NOTE — Clinical Documentation Improvement (Signed)
MD's, NP's, and PA's   Noted patient spiked temp 24 hours into hospital course of 100.8-101.4, WBC 3.2 on admit with diagnosis of PNA treated with IV Rocephin in ED.  Would either of the following conditions be appropriate for this admission? Present on Admission?  Please document notes and discharge summary if condition is present. Thank you    Possible Clinical Conditions?  Sepsis SIRS Other Condition  Cannot clinically Determine    Diagnostics: Lactic Acid, CBC w diff, Chest Xray, blood cultures   Treatment: IV Rocephin, then po Azithromycin  Thank You, Ree Kida ,RN, BSN, Certified Clinical Documentation Specialist:  [place] [employer]

## 2014-09-19 NOTE — Evaluation (Signed)
Physical Therapy Evaluation Patient Details Name: [name] MRN: [id number] DOB: [birth year] Today's Date: 09/19/2014   History of Present Illness  69 yo female with onset of  PNA, SOB, tachycardia and chest pain in axilla was admitted and referred to PT.  Her vitals were off this am and after cardiology appt with diagnosis of demand ischemia, PT has finally been able to assess.  PMHx:  HTN, CHF, CKD 3, DM, HLD, GERD   Clinical Impression  Pt was seen for evaluation of her functional level after being at low O2 sats today.  Checked her during mobility and found some decline so will need to monitor her for sat drops durign gait.  SNF is recommended but pt will likely decide to go home directly.  Husband in attendance for appt.    Follow Up Recommendations SNF    Equipment Recommendations  Rolling walker with 5" wheels    Recommendations for Other Services       Precautions / Restrictions Precautions Precautions: Fall Restrictions Weight Bearing Restrictions: No      Mobility  Bed Mobility Overal bed mobility: Needs Assistance Bed Mobility: Supine to Sit;Sit to Supine     Supine to sit: Min guard Sit to supine: Min guard   General bed mobility comments: uses bedrails and HOB elevated to assist herself  Transfers Overall transfer level: Needs assistance Equipment used: 1 person hand held assist (used vital collection cart as AD and to ck O2 sats) Transfers: Sit to/from [facility] Sit to Stand: Min assist;Min guard Stand pivot transfers: Min assist;Min guard       General transfer comment: guarding as pt is declining in O2 sats with activity and wiht safety awareness  Ambulation/Gait Ambulation/Gait assistance: Min guard;Min assist Ambulation Distance (Feet): 100 Feet Assistive device: 1 person hand held assist (vitals collection cart as AD) Gait Pattern/deviations: Step-through pattern Gait velocity: slow Gait velocity interpretation: Below  normal speed for age/gender General Gait Details: pt is shuffled and weak, with LLE lagging and needs supervision to monitor her O2 sats  Stairs            Wheelchair Mobility    Modified Rankin (Stroke Patients Only)       Balance Overall balance assessment: Needs assistance Sitting-balance support: Feet supported Sitting balance-Leahy Scale: Fair   Postural control: Posterior lean Standing balance support: Bilateral upper extremity supported Standing balance-Leahy Scale: Poor Standing balance comment: needed reminding to control standing balance and to plan safe turns                             Pertinent Vitals/Pain Pain Assessment: No/denies pain    Home Living Family/patient expects to be discharged to:: Private residence Living Arrangements: Spouse/significant other Available Help at Discharge: Family;Available 24 hours/day Type of Home: House Home Access: Stairs to enter Entrance Stairs-Rails: Right;Left;Can reach both Entrance Stairs-Number of Steps: 2 Home Layout: One level Home Equipment: None      Prior Function Level of Independence: Independent               Hand Dominance        Extremity/Trunk Assessment   Upper Extremity Assessment: Overall WFL for tasks assessed           Lower Extremity Assessment: Generalized weakness      Cervical / Trunk Assessment: Normal  Communication   Communication: No difficulties  Cognition Arousal/Alertness: Awake/alert Behavior During Therapy: WFL for tasks assessed/performed  Overall Cognitive Status: Within Functional Limits for tasks assessed                      General Comments General comments (skin integrity, edema, etc.): Pt is having O2 sat declines.  Talked with nurse about charted readings and she checked pt, noted 91%.  With gait dropped to 88% which was sitting reading as well.  After gait 88% but dropped to 85% before a slow recovery to 92% in bed supine.   Reported to nursing that pt would need RW, SNF placement.      Exercises        Assessment/Plan    PT Assessment Patient needs continued PT services  PT Diagnosis Difficulty walking;Generalized weakness   PT Problem List Decreased strength;Decreased range of motion;Decreased activity tolerance;Decreased balance;Decreased mobility;Decreased coordination;Decreased knowledge of use of DME;Decreased safety awareness;Cardiopulmonary status limiting activity  PT Treatment Interventions DME instruction;Gait training;Stair training;Functional mobility training;Therapeutic activities;Therapeutic exercise;Balance training;Neuromuscular re-education;Patient/family education   PT Goals (Current goals can be found in the Care Plan section) Acute Rehab PT Goals Patient Stated Goal: to get home and avoid SNF PT Goal Formulation: With patient/family Time For Goal Achievement: 10/03/14 Potential to Achieve Goals: Good    Frequency Min 2X/week   Barriers to discharge Inaccessible home environment      Co-evaluation               End of Session Equipment Utilized During Treatment: Gait belt Activity Tolerance: Patient limited by fatigue;Other (comment) (O2 sats dropping) Patient left: in bed;with call bell/phone within reach;with family/visitor present Nurse Communication: Mobility status;Other (comment) (declines of O2 sats)         Time: 3295-1884 PT Time Calculation (min) (ACUTE ONLY): 29 min   Charges:   PT Evaluation $Initial PT Evaluation Tier I: 1 Procedure PT Treatments $Gait Training: 8-22 mins   PT G Codes:        Ramond Dial 2014-10-08, 4:17 PM  Mee Hives, PT MS Acute Rehab Dept. Number: [facility] [id number] and [place] [phone]

## 2014-09-19 NOTE — Progress Notes (Signed)
[facility] Encounter Note  Patient: [name] / Admit Date: 09/17/2014 / Date of Encounter: 09/19/2014, 1:19 PM   Subjective: Short of breath with less chest pain but still somewhat weakwith some left-sided axillary chest pain  Review of Systems: Positive for: Shortness of breathand left-sided chest discomfort Negative for: Vision change, hearing change, syncope, dizziness, nausea, vomiting,diarrhea, bloody stool, stomach pain, cough, congestion, diaphoresis, urinary frequency, urinary pain,skin lesions, skin rashes Others previously listed  Objective: Telemetry: Normal sinus rhythm with pre-atrial and preventricular contractions Physical Exam: Blood pressure 149/72, pulse 78, temperature 97.6 F (36.4 C), temperature source Oral, resp. rate 17, height 5\' 7"  (1.702 m), weight 159 lb 9.6 oz (72.394 kg), SpO2 85 %. Body mass index is 24.99 kg/(m^2). General: Well developed, well nourished, in no acute distress. Head: Normocephalic, atraumatic, sclera non-icteric, no xanthomas, nares are without discharge. Neck: No apparent masses Lungs: Normal respirations with positive for wheezes, no rhonchi, no rales , few basilar crackles   Heart: Regular rate and rhythm, normal S1 S2, no murmur, no rub, no gallop, PMI is normal size and placement, carotid upstroke normal without bruit, jugular venous pressure normal Abdomen: Soft, non-tender, non-distended with normoactive bowel sounds. No hepatosplenomegaly. Abdominal aorta is normal size without bruit Extremities: Trace to 1+ edema, no clubbing, no cyanosis, no ulcers,  Peripheral: 2+ radial, 2+ femoral, 2+ dorsal pedal pulses Neuro: Alert and oriented. Moves all extremities spontaneously. Psych:  Responds to questions appropriately with a normal affect.   Intake/Output Summary (Last 24 hours) at 09/19/14 1319 Last data filed at 09/19/14 0814  Gross per 24 hour  Intake    773 ml  Output    300 ml  Net    473 ml     Inpatient Medications:  . amLODipine  10 mg Oral Daily  . aspirin  81 mg Oral Daily  . azithromycin  250 mg Oral Daily  . cefTRIAXone (ROCEPHIN)  IV  1 g Intravenous Q24H  . docusate sodium  100 mg Oral BID  . feeding supplement (GLUCERNA SHAKE)  237 mL Oral BID BM  . ferrous sulfate  325 mg Oral Daily  . furosemide  40 mg Oral BID  . glimepiride  1 mg Oral Q breakfast  . heparin  5,000 Units Subcutaneous 3 times per day  . hydrALAZINE  100 mg Oral TID  . insulin aspart  0-15 Units Subcutaneous TID WC  . insulin aspart  0-5 Units Subcutaneous QHS  . isosorbide dinitrate  30 mg Oral BID  . labetalol  200 mg Oral BID  . meloxicam  7.5 mg Oral BID WC  . omega-3 acid ethyl esters  1 g Oral Daily  . pantoprazole  40 mg Oral Daily  . potassium chloride  20 mEq Oral Daily  . ramipril  5 mg Oral Daily  . senna  1 tablet Oral BID  . simvastatin  20 mg Oral QPM  . sodium chloride  3 mL Intravenous Q12H   Infusions:    Labs:  Recent Labs  09/18/14 0446 09/19/14 0450  NA 142 142  K 3.5 3.9  CL 111 112*  CO2 24 25  GLUCOSE 82 109*  BUN 22* 25*  CREATININE 1.33* 1.43*  CALCIUM 7.6* 7.6*  MG 2.0 2.1  PHOS 4.5 3.7    Recent Labs  09/17/14 0827  AST 29  ALT 17  ALKPHOS 68  BILITOT 0.6  PROT 6.7  ALBUMIN 2.8*    Recent Labs  09/17/14 0827  WBC 3.2*  NEUTROABS 2.5  HGB 8.9*  HCT 27.6*  MCV 78.3*  PLT 302    Recent Labs  09/17/14 1410 09/17/14 1656 09/17/14 1953 09/18/14 0446  TROPONINI 0.07* 0.09* 0.11* 0.14*   Invalid input(s): POCBNP  Recent Labs  09/17/14 1410  HGBA1C 4.4     Weights: Filed Weights   09/17/14 0818 09/18/14 0557 09/19/14 0458  Weight: 153 lb (69.4 kg) 147 lb 0.8 oz (66.7 kg) 159 lb 9.6 oz (72.394 kg)     Radiology/Studies:  Dg Chest 2 View  09/17/2014   CLINICAL DATA:  Right side chest pain beginning 09/16/2014. Shortness of breath.  EXAM: CHEST  2 VIEW  COMPARISON:  PA and lateral chest 07/21/2014.  FINDINGS: There is  cardiomegaly. Since the prior study, the patient has developed small to moderate pleural effusions and basilar airspace disease, worse on the right. No pneumothorax is identified. There is no evidence of pulmonary edema. Degenerative change is seen about the shoulders.  IMPRESSION: New small to moderate pleural effusions and basilar airspace disease, worse on the right.  Cardiomegaly without edema.   Electronically Signed   By: Inge Rise M.D.   On: 09/17/2014 10:07   US Renal  09/17/2014   CLINICAL DATA:  Chronic kidney disease, hypertension, diabetes mellitus  EXAM: RENAL / URINARY TRACT ULTRASOUND COMPLETE  COMPARISON:  CT abdomen and pelvis 04/06/2009  FINDINGS: Right Kidney:  Length: 10.1 cm. Normal cortical thickness. Increased cortical echogenicity. No mass, hydronephrosis or shadowing calcification.  Left Kidney:  Length: 12.0 cm. Normal cortical thickness. Increased cortical echogenicity. Echogenic nodule mid LEFT kidney 12 x 11 x 18 mm question angiomyolipoma present on prior CT. No additional renal mass, hydronephrosis or shadowing calcification.  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  Medical renal disease changes of both kidneys.  No additional renal mass or hydronephrosis.   Electronically Signed   By: Lavonia Dana M.D.   On: 09/17/2014 19:32     Assessment and Recommendation  69 y.o. female with essential hypertension diabetes with complication chronic kidney disease stage III anemia with pleural effusions consistent with acute diastolic dysfunction congestive heart failure. The patient has had an elevated troponin most consistent with demand ischemia and no current evidence of myocardial infarction 1. Continue Lasix for pleural effusions as well as edema watching closely for worsening chronic kidney disease 2. Hypertension control with additional medications as already given today including beta blocker for better heart rate control  ACE inhibitor for renal protection and hypertension control 3. No further cardiac intervention of minimal elevation of troponin most consistent with demand ischemia and no further current concerns of atypical left-sided chest discomfort unrelenting nature 4. Okay for  ambulation following for any in adjustments of medication management   5. No further cardiac diagnostics necessary at this time hospital followup in one week for further cardiac medication management and adjustment  Signed, Serafina Royals M.D. FACC

## 2014-09-20 LAB — GLUCOSE, CAPILLARY
Glucose-Capillary: 75 mg/dL (ref 65–99)
Glucose-Capillary: 120 mg/dL — ABNORMAL HIGH (ref 65–99)

## 2014-09-20 LAB — BASIC METABOLIC PANEL
ANION GAP: 6 (ref 5–15)
BUN: 29 mg/dL — ABNORMAL HIGH (ref 6–20)
CO2: 24 mmol/L (ref 22–32)
Calcium: 7.5 mg/dL — ABNORMAL LOW (ref 8.9–10.3)
Chloride: 107 mmol/L (ref 101–111)
Creatinine, Ser: 1.66 mg/dL — ABNORMAL HIGH (ref 0.44–1.00)
GFR calc Af Amer: 35 mL/min — ABNORMAL LOW (ref >60)
GFR, EST NON AFRICAN AMERICAN: 30 mL/min — AB (ref >60)
Glucose, Bld: 100 mg/dL — ABNORMAL HIGH (ref 65–99)
POTASSIUM: 4.5 mmol/L (ref 3.5–5.1)
SODIUM: 137 mmol/L (ref 135–145)

## 2014-09-20 MED ORDER — GLUCERNA SHAKE PO LIQD
237.0000 mL | Freq: Two times a day (BID) | ORAL | Status: DC
Start: 2014-09-20 — End: 2015-06-27

## 2014-09-20 MED ORDER — POTASSIUM CHLORIDE CRYS ER 20 MEQ PO TBCR: 20.0000 meq | EXTENDED_RELEASE_TABLET | Freq: Every day | ORAL | Status: DC

## 2014-09-20 MED ORDER — FUROSEMIDE 40 MG PO TABS: 40.0000 mg | ORAL_TABLET | Freq: Two times a day (BID) | ORAL | Status: DC

## 2014-09-20 MED ORDER — LABETALOL HCL 200 MG PO TABS
200.0000 mg | ORAL_TABLET | Freq: Two times a day (BID) | ORAL | Status: DC
Start: 2014-09-20 — End: 2014-11-02

## 2014-09-20 MED ORDER — CEFUROXIME AXETIL 500 MG PO TABS: 500.0000 mg | ORAL_TABLET | Freq: Two times a day (BID) | ORAL | Status: DC

## 2014-09-20 MED ORDER — CEFUROXIME AXETIL 500 MG PO TABS
500.0000 mg | ORAL_TABLET | Freq: Two times a day (BID) | ORAL | Status: DC
  Filled 2014-09-20 (×2): qty 1

## 2014-09-20 MED ORDER — AZITHROMYCIN 250 MG PO TABS: ORAL_TABLET | ORAL | Status: DC

## 2014-09-20 NOTE — Clinical Social Work Note (Signed)
Clinical Social Work Assessment  Patient Details  Name: [name] MRN: [id number] Date of Birth: 1945-08-26  Date of referral:  09/20/14               Reason for consult:  Facility Placement                Permission sought to share information with:  Family Supports Permission granted to share information::  Yes, Verbal Permission Granted  Name::        Agency::     Relationship::     Contact Information:     Housing/Transportation Living arrangements for the past 2 months:  Single Family Home Source of Information:  Patient, Adult Children, Spouse Patient Interpreter Needed:  None Criminal Activity/Legal Involvement Pertinent to Current Situation/Hospitalization:  No - Comment as needed Significant Relationships:  Adult Children, Spouse Lives with:  Spouse Do you feel safe going back to the place where you live?  Yes Need for family participation in patient care:  Yes (Comment)  Care giving concerns:  Pt stated that she felt comfortable going home.  Pt's son and husband stated that they would be able to help her at home with home home health.     Social Worker assessment / plan:  CSW spoke to pt.  SHe was Ox3 and aware of her medical situation.  CSW explained what SNF placement was and the level of care that had been recommended.  Pt stated that she would like to go home and did not want to go to a SNF for STR.  CSW clarified that home health was only done on a limited basis, pt stated that she was fine with this and that she still wanted to go home with home health.  CSW also confirmed that pt's husband and adult soon would be able to assist pt when she was at home.  SNF placement refused at this time.  CSW signing off.   Employment status:  Retired Forensic scientist:  [employer] PT Recommendations:  [place] / Referral to community resources:     Patient/Family's Response to care:  Pt and pt's family were in agreement with this DC plan of  Home Health  Patient/Family's Understanding of and Emotional Response to Diagnosis, Current Treatment, and Prognosis:  Pt and family stated that they all understood the difference between home health and SNF placement.  All parties (pt's husband and adult son)  Were in agreement with DC plan for Home Health.     Emotional Assessment Appearance:  Appears stated age Attitude/Demeanor/Rapport:   (polite and pleasent) Affect (typically observed):   (Polite and pleasent.) Orientation:  Oriented to Self, Oriented to Place, Oriented to  Time, Oriented to Situation Alcohol / Substance use:  Never Used Psych involvement (Current and /or in the community):  No (Comment)  Discharge Needs  Concerns to be addressed:  Home Safety Concerns Readmission within the last 30 days:  No Current discharge risk:  Lack of support system Barriers to Discharge:      Mathews [place], LCSW 09/20/2014, 10:11 AM

## 2014-09-20 NOTE — Progress Notes (Addendum)
Patient has no acute event. She is NSR on the monitor, denied pain, N&V with VS WDL for patient. Patient remained on room air with routine O2sat above 90%. Patient received PRN  phenol (CHLORASEPTIC) mouth spray 1 spray X1 for complaint of sore throat.  Husband was at patient's bedside overnight.

## 2014-09-20 NOTE — Care Management (Signed)
Patient is for discharge home.  She has declined skilled nursing recommendation..  Agreeable to home health nursing and physical therapy.  Agency preference is [facility].  Referral called to [facility].  Confirmed contact information and PCP.

## 2014-09-20 NOTE — Discharge Summary (Signed)
[name] NAME: [name]    MR#:  [id number]  DATE OF BIRTH:  10-Dec-1945  DATE OF ADMISSION:  09/17/2014 ADMITTING PHYSICIAN: Theodoro Grist, MD  DATE OF DISCHARGE: 09/20/2014.  PRIMARY CARE PHYSICIAN: Morton Peters, MD    ADMISSION DIAGNOSIS:  Pain [R52] Community acquired pneumonia [J18.9] CKD (chronic kidney disease) [N18.9]  DISCHARGE DIAGNOSIS:  Principal Problem:   HTN (hypertension), malignant Active Problems:   Pneumonia   malignnat HTN   SECONDARY DIAGNOSIS:   Past Medical History  Diagnosis Date  . Hypertension   . Diabetes mellitus without complication     HOSPITAL COURSE:   1. Accelerated hypertension: Patient was admitted to the ICU for close monitoring of blood pressure. Medication adjustments were made including increasing the dose of labetalol. Blood pressure upon discharge improved. 2. Clinical sepsis, pneumonia right lower lobe: Patient was initially placed on Zithromax. She spiked a fever of 101. I added Rocephin. Temperature curve has come down and patient is breathing better. She will go home on Rocephin and Zithromax to finish up the course. 3. Acute congestive heart failure, unknown ejection fraction. Echocardiogram completed but not read at this point. Patient is on Altace, labetalol, Lasix. Follow-up with Dr. Clayborn Bigness as outpatient. 4. Chronic kidney disease stage III: Seen in consultation by Dr. Holley Raring who will follow-up as outpatient creatinine even with diuresis remains stable. 5. Type 2 diabetes controlled continue usual medication  DISCHARGE CONDITIONS:   Satisfactory  CONSULTS OBTAINED:  Treatment Team:  Anthonette Legato, MD Corey Skains, MD  DRUG ALLERGIES:   Allergies  Allergen Reactions  . Codeine     REACTION: hallucinations    DISCHARGE MEDICATIONS:   Current Discharge Medication List    START taking these medications   Details  azithromycin  (ZITHROMAX) 250 MG tablet One tablet daily orally Qty: 3 each, Refills: 0    cefUROXime (CEFTIN) 500 MG tablet Take 1 tablet (500 mg total) by mouth 2 (two) times daily with a meal. Qty: 16 tablet, Refills: 0    feeding supplement, GLUCERNA SHAKE, (GLUCERNA SHAKE) LIQD Take 237 mLs by mouth 2 (two) times daily between meals. Qty: 60 Can, Refills: 0    furosemide (LASIX) 40 MG tablet Take 1 tablet (40 mg total) by mouth 2 (two) times daily. Qty: 60 tablet, Refills: 0    potassium chloride SA (K-DUR,KLOR-CON) 20 MEQ tablet Take 1 tablet (20 mEq total) by mouth daily. Qty: 30 tablet, Refills: 0      CONTINUE these medications which have CHANGED   Details  labetalol (NORMODYNE) 200 MG tablet Take 1 tablet (200 mg total) by mouth 2 (two) times daily. Qty: 60 tablet, Refills: 0      CONTINUE these medications which have NOT CHANGED   Details  docusate sodium (COLACE) 100 MG capsule Take 100 mg by mouth 2 (two) times daily.    ferrous sulfate 325 (65 FE) MG tablet Take 325 mg by mouth daily.    glimepiride (AMARYL) 2 MG tablet Take 1 mg by mouth daily with breakfast. Take 1/2 tablet daily    hydrALAZINE (APRESOLINE) 100 MG tablet Take 100 mg by mouth 3 (three) times daily.     isosorbide dinitrate (ISORDIL) 30 MG tablet Take 30 mg by mouth 2 (two) times daily.     meloxicam (MOBIC) 7.5 MG tablet Take 7.5 mg by mouth 2 (two) times daily with a meal.    oxyCODONE-acetaminophen (PERCOCET/ROXICET) 5-325 MG per  tablet Take 1 tablet by mouth 3 (three) times daily as needed. pain    pantoprazole (PROTONIX) 40 MG tablet Take 40 mg by mouth daily.    ramipril (ALTACE) 5 MG capsule Take 5 mg by mouth daily.     simvastatin (ZOCOR) 20 MG tablet Take 20 mg by mouth every evening.     amLODipine (NORVASC) 10 MG tablet Take 10 mg by mouth daily.    aspirin 81 MG tablet Take 81 mg by mouth daily.    Omega-3 Fatty Acids (FISH OIL PO) Take 1 capsule by mouth daily.      STOP taking these  medications     diclofenac (VOLTAREN) 50 MG EC tablet          DISCHARGE INSTRUCTIONS:   Follow-up with Dr. Clayborn Bigness cardiology, Dr. Anthonette Legato nephrology and Dr. Laurian Brim PMD.  If you experience worsening of your admission symptoms, develop shortness of breath, life threatening emergency, suicidal or homicidal thoughts you must seek medical attention immediately by calling 911 or calling your MD immediately  if symptoms less severe.  You Must read complete instructions/literature along with all the possible adverse reactions/side effects for all the Medicines you take and that have been prescribed to you. Take any new Medicines after you have completely understood and accept all the possible adverse reactions/side effects.   Please note  You were cared for by a hospitalist during your hospital stay. If you have any questions about your discharge medications or the care you received while you were in the hospital after you are discharged, you can call the unit and asked to speak with the hospitalist on call if the hospitalist that took care of you is not available. Once you are discharged, your primary care physician will handle any further medical issues. Please note that NO REFILLS for any discharge medications will be authorized once you are discharged, as it is imperative that you return to your primary care physician (or establish a relationship with a primary care physician if you do not have one) for your aftercare needs so that they can reassess your need for medications and monitor your lab values.    Today   CHIEF COMPLAINT:   Chief Complaint  Patient presents with  . Abdominal Pain    HISTORY OF PRESENT ILLNESS:  [name]  is a 69 y.o. female with a known history of hypertension. She presented with accelerated hypertension found to have pneumonia and clinical sepsis and also congestive heart failure.  VITAL SIGNS:  Blood pressure 143/51, pulse 75,  temperature 99.5 F (37.5 C), temperature source Oral, resp. rate 18, height 5\' 7"  (1.702 m), weight 66.906 kg (147 lb 8 oz), SpO2 96 %.  I/O:   Intake/Output Summary (Last 24 hours) at 09/20/14 1003 Last data filed at 09/19/14 2053  Gross per 24 hour  Intake    480 ml  Output      0 ml  Net    480 ml    PHYSICAL EXAMINATION:  GENERAL:  69 y.o.-year-old patient lying in the bed with no acute distress.  EYES: Pupils equal, round, reactive to light and accommodation. No scleral icterus. Extraocular muscles intact.  HEENT: Head atraumatic, normocephalic. Oropharynx and nasopharynx clear.  NECK:  Supple, no jugular venous distention. No thyroid enlargement, no tenderness.  LUNGS: Normal breath sounds bilaterally, no wheezing, rales, positive for rhonchi bilateral bases. No use of accessory muscles of respiration.  CARDIOVASCULAR: S1, S2 normal. 2 and a 6 systolic ejection  murmur, rubs, or gallops.  ABDOMEN: Soft, non-tender, non-distended. Bowel sounds present. No organomegaly or mass.  EXTREMITIES: Trace edema, cyanosis, or clubbing.  NEUROLOGIC: Cranial nerves II through XII are intact. Muscle strength 5/5 in all extremities. Sensation intact. Gait not checked.  PSYCHIATRIC: The patient is alert and oriented x 3.  SKIN: No obvious rash, lesion, or ulcer.   DATA REVIEW:   CBC  Recent Labs Lab 09/17/14 0827  WBC 3.2*  HGB 8.9*  HCT 27.6*  PLT 302    Chemistries   Recent Labs Lab 09/17/14 0827  09/19/14 0450 09/20/14 0433  NA 139  < > 142 137  K 3.8  < > 3.9 4.5  CL 107  < > 112* 107  CO2 24  < > 25 24  GLUCOSE 103*  < > 109* 100*  BUN 24*  < > 25* 29*  CREATININE 1.53*  < > 1.43* 1.66*  CALCIUM 7.8*  < > 7.6* 7.5*  MG  --   < > 2.1  --   AST 29  --   --   --   ALT 17  --   --   --   ALKPHOS 68  --   --   --   BILITOT 0.6  --   --   --   < > = values in this interval not displayed.  Cardiac Enzymes  Recent Labs Lab 09/18/14 0446  TROPONINI 0.14*     Microbiology Results  Results for orders placed or performed during the hospital encounter of 09/17/14  Culture, blood (routine x 2)     Status: None (Preliminary result)   Collection Time: 09/17/14 11:20 AM  Result Value Ref Range Status   Specimen Description BLOOD  Final   Special Requests LEFT ARM  Final   Culture NO GROWTH 2 DAYS  Final   Report Status PENDING  Incomplete  Culture, blood (routine x 2)     Status: None (Preliminary result)   Collection Time: 09/17/14 11:20 AM  Result Value Ref Range Status   Specimen Description BLOOD  Final   Special Requests RIGHT AC  Final   Culture NO GROWTH 2 DAYS  Final   Report Status PENDING  Incomplete     Management plans discussed with the patient, family and they are in agreement.  CODE STATUS:     Code Status Orders        Start     Ordered   09/17/14 1400  Full code   Continuous     09/17/14 1400      TOTAL TIME TAKING CARE OF THIS PATIENT: 40 minutes.    Loletha Grayer M.D on 09/20/2014 at 10:03 AM  Between 7am to 6pm - Pager - [phone]  After 6pm go to [email or web address] - password [email or web address] [facility]  Office  [phone]  CC: Primary care physician; Morton Peters, MD

## 2014-09-20 NOTE — Progress Notes (Signed)
Discharge:  Pt d/c from room via wheelchair, husband with pt.  Discharge instructions given to the patient and husband.  No questions from pt,  reintegrated to the pt to call or go to the ED for chest discomfort.  Pt dressed in street clothes and left with discharge papers and prescriptions  in hand.  IV d/ced, tele removed and no complaints of pain or discomfort.

## 2014-09-20 NOTE — Progress Notes (Signed)
[facility] Encounter Note  Patient: [name] / Admit Date: 09/17/2014 / Date of Encounter: 09/20/2014, 8:02 AM   Subjective: Short of breath with no further left chest pain most consistent with pleuritic pain yesterday  Review of Systems: Positive for: Shortness of breathand left-sided chest discomfort Negative for: Vision change, hearing change, syncope, dizziness, nausea, vomiting,diarrhea, bloody stool, stomach pain, cough, congestion, diaphoresis, urinary frequency, urinary pain,skin lesions, skin rashes Others previously listed  Objective: Telemetry: Normal sinus rhythm with pre-atrial and preventricular contractions Physical Exam: Blood pressure 139/44, pulse 76, temperature 98.5 F (36.9 C), temperature source Oral, resp. rate 18, height 5\' 7"  (1.702 m), weight 147 lb 8 oz (66.906 kg), SpO2 90 %. Body mass index is 23.1 kg/(m^2). General: Well developed, well nourished, in no acute distress. Head: Normocephalic, atraumatic, sclera non-icteric, no xanthomas, nares are without discharge. Neck: No apparent masses Lungs: Normal respirations with positive for wheezes, no rhonchi, no rales , few basilar crackles   Heart: Regular rate and rhythm, normal S1 S2, no murmur, no rub, no gallop, PMI is normal size and placement, carotid upstroke normal without bruit, jugular venous pressure normal Abdomen: Soft, non-tender, non-distended with normoactive bowel sounds. No hepatosplenomegaly. Abdominal aorta is normal size without bruit Extremities: Trace to 1+ edema, no clubbing, no cyanosis, no ulcers,  Peripheral: 2+ radial, 2+ femoral, 2+ dorsal pedal pulses Neuro: Alert and oriented. Moves all extremities spontaneously. Psych:  Responds to questions appropriately with a normal affect.   Intake/Output Summary (Last 24 hours) at 09/20/14 0802 Last data filed at 09/19/14 2053  Gross per 24 hour  Intake    720 ml  Output      0 ml  Net    720 ml    Inpatient  Medications:  . amLODipine  10 mg Oral Daily  . aspirin  81 mg Oral Daily  . azithromycin  250 mg Oral Daily  . cefTRIAXone (ROCEPHIN)  IV  1 g Intravenous Q24H  . docusate sodium  100 mg Oral BID  . feeding supplement (GLUCERNA SHAKE)  237 mL Oral BID BM  . ferrous sulfate  325 mg Oral Daily  . furosemide  40 mg Oral BID  . glimepiride  1 mg Oral Q breakfast  . heparin  5,000 Units Subcutaneous 3 times per day  . hydrALAZINE  100 mg Oral TID  . insulin aspart  0-15 Units Subcutaneous TID WC  . insulin aspart  0-5 Units Subcutaneous QHS  . isosorbide dinitrate  30 mg Oral BID  . labetalol  200 mg Oral BID  . meloxicam  7.5 mg Oral BID WC  . omega-3 acid ethyl esters  1 g Oral Daily  . pantoprazole  40 mg Oral Daily  . potassium chloride  20 mEq Oral Daily  . ramipril  5 mg Oral Daily  . senna  1 tablet Oral BID  . simvastatin  20 mg Oral QPM  . sodium chloride  3 mL Intravenous Q12H   Infusions:    Labs:  Recent Labs  09/18/14 0446 09/19/14 0450 09/20/14 0433  NA 142 142 137  K 3.5 3.9 4.5  CL 111 112* 107  CO2 24 25 24   GLUCOSE 82 109* 100*  BUN 22* 25* 29*  CREATININE 1.33* 1.43* 1.66*  CALCIUM 7.6* 7.6* 7.5*  MG 2.0 2.1  --   PHOS 4.5 3.7  --     Recent Labs  09/17/14 0827  AST 29  ALT 17  ALKPHOS 68  BILITOT 0.6  PROT 6.7  ALBUMIN 2.8*    Recent Labs  09/17/14 0827  WBC 3.2*  NEUTROABS 2.5  HGB 8.9*  HCT 27.6*  MCV 78.3*  PLT 302    Recent Labs  09/17/14 1410 09/17/14 1656 09/17/14 1953 09/18/14 0446  TROPONINI 0.07* 0.09* 0.11* 0.14*   Invalid input(s): POCBNP  Recent Labs  09/17/14 1410  HGBA1C 4.4     Weights: Filed Weights   09/18/14 0557 09/19/14 0458 09/20/14 0521  Weight: 147 lb 0.8 oz (66.7 kg) 159 lb 9.6 oz (72.394 kg) 147 lb 8 oz (66.906 kg)     Radiology/Studies:  Dg Chest 2 View  09/17/2014   CLINICAL DATA:  Right side chest pain beginning 09/16/2014. Shortness of breath.  EXAM: CHEST  2 VIEW  COMPARISON:  PA  and lateral chest 07/21/2014.  FINDINGS: There is cardiomegaly. Since the prior study, the patient has developed small to moderate pleural effusions and basilar airspace disease, worse on the right. No pneumothorax is identified. There is no evidence of pulmonary edema. Degenerative change is seen about the shoulders.  IMPRESSION: New small to moderate pleural effusions and basilar airspace disease, worse on the right.  Cardiomegaly without edema.   Electronically Signed   By: Inge Rise M.D.   On: 09/17/2014 10:07   US Renal  09/17/2014   CLINICAL DATA:  Chronic kidney disease, hypertension, diabetes mellitus  EXAM: RENAL / URINARY TRACT ULTRASOUND COMPLETE  COMPARISON:  CT abdomen and pelvis 04/06/2009  FINDINGS: Right Kidney:  Length: 10.1 cm. Normal cortical thickness. Increased cortical echogenicity. No mass, hydronephrosis or shadowing calcification.  Left Kidney:  Length: 12.0 cm. Normal cortical thickness. Increased cortical echogenicity. Echogenic nodule mid LEFT kidney 12 x 11 x 18 mm question angiomyolipoma present on prior CT. No additional renal mass, hydronephrosis or shadowing calcification.  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  Medical renal disease changes of both kidneys.  No additional renal mass or hydronephrosis.   Electronically Signed   By: Lavonia Dana M.D.   On: 09/17/2014 19:32     Assessment and Recommendation  69 y.o. female with essential hypertension diabetes with complication chronic kidney disease stage III anemia with pleural effusions consistent with acute diastolic dysfunction congestive heart failure. The patient has had an elevated troponin most consistent with demand ischemia and no current evidence of myocardial infarction 1. Continue Lasix for pleural effusions as well as edema watching closely for worsening chronic kidney disease and use oral Lasix at this time 2. Hypertension control with current medical  regimen which appears to be working fairly well today  3. No further cardiac intervention of minimal elevation of troponin most consistent with demand ischemia and no further current concerns of atypical left-sided chest discomfort now resolved 4. Okay for  ambulation following for any in adjustments of medication management   5. No further cardiac diagnostics necessary at this time hospital followup in one week for further cardiac medication management and adjustment  Signed, Serafina Royals M.D. FACC

## 2014-09-22 LAB — CULTURE, BLOOD (ROUTINE X 2)
Culture: NO GROWTH
Culture: NO GROWTH

## 2014-09-24 ENCOUNTER — Ambulatory Visit: Payer: Medicare HMO | Admitting: Anesthesiology

## 2014-09-24 ENCOUNTER — Encounter
Admission: RE | Disposition: A | Discharge status: Home / Self Care | Payer: Self-pay | Source: Ambulatory Visit | Attending: Unknown Physician Specialty

## 2014-09-24 ENCOUNTER — Ambulatory Visit
Admission: RE | Admit: 2014-09-24 | Discharge: 2014-09-24 | Disposition: A | Discharge status: Home / Self Care | Payer: Medicare HMO | Source: Ambulatory Visit | Attending: Unknown Physician Specialty | Admitting: Unknown Physician Specialty

## 2014-09-24 DIAGNOSIS — Z885 Allergy status to narcotic agent status: Secondary | ICD-10-CM | POA: Diagnosis not present

## 2014-09-24 DIAGNOSIS — K2970 Gastritis, unspecified, without bleeding: Secondary | ICD-10-CM | POA: Insufficient documentation

## 2014-09-24 DIAGNOSIS — Z955 Presence of coronary angioplasty implant and graft: Secondary | ICD-10-CM | POA: Diagnosis not present

## 2014-09-24 DIAGNOSIS — Z79899 Other long term (current) drug therapy: Secondary | ICD-10-CM | POA: Insufficient documentation

## 2014-09-24 DIAGNOSIS — E780 Pure hypercholesterolemia: Secondary | ICD-10-CM | POA: Insufficient documentation

## 2014-09-24 DIAGNOSIS — E1140 Type 2 diabetes mellitus with diabetic neuropathy, unspecified: Secondary | ICD-10-CM | POA: Diagnosis not present

## 2014-09-24 DIAGNOSIS — K640 First degree hemorrhoids: Secondary | ICD-10-CM | POA: Diagnosis not present

## 2014-09-24 DIAGNOSIS — Z86010 Personal history of colonic polyps: Secondary | ICD-10-CM | POA: Diagnosis present

## 2014-09-24 DIAGNOSIS — H409 Unspecified glaucoma: Secondary | ICD-10-CM | POA: Diagnosis not present

## 2014-09-24 DIAGNOSIS — I2510 Atherosclerotic heart disease of native coronary artery without angina pectoris: Secondary | ICD-10-CM | POA: Insufficient documentation

## 2014-09-24 DIAGNOSIS — E1139 Type 2 diabetes mellitus with other diabetic ophthalmic complication: Secondary | ICD-10-CM | POA: Insufficient documentation

## 2014-09-24 DIAGNOSIS — R490 Dysphonia: Secondary | ICD-10-CM | POA: Diagnosis present

## 2014-09-24 DIAGNOSIS — I429 Cardiomyopathy, unspecified: Secondary | ICD-10-CM | POA: Insufficient documentation

## 2014-09-24 DIAGNOSIS — I509 Heart failure, unspecified: Secondary | ICD-10-CM | POA: Insufficient documentation

## 2014-09-24 DIAGNOSIS — Z79891 Long term (current) use of opiate analgesic: Secondary | ICD-10-CM | POA: Insufficient documentation

## 2014-09-24 DIAGNOSIS — E11319 Type 2 diabetes mellitus with unspecified diabetic retinopathy without macular edema: Secondary | ICD-10-CM | POA: Insufficient documentation

## 2014-09-24 DIAGNOSIS — R634 Abnormal weight loss: Secondary | ICD-10-CM | POA: Insufficient documentation

## 2014-09-24 DIAGNOSIS — I10 Essential (primary) hypertension: Secondary | ICD-10-CM | POA: Diagnosis not present

## 2014-09-24 DIAGNOSIS — Z87891 Personal history of nicotine dependence: Secondary | ICD-10-CM | POA: Diagnosis not present

## 2014-09-24 HISTORY — PX: ESOPHAGOGASTRODUODENOSCOPY (EGD) WITH PROPOFOL: SHX5813

## 2014-09-24 HISTORY — PX: COLONOSCOPY WITH PROPOFOL: SHX5780

## 2014-09-24 LAB — GLUCOSE, CAPILLARY: GLUCOSE-CAPILLARY: 79 mg/dL (ref 65–99)

## 2014-09-24 SURGERY — ESOPHAGOGASTRODUODENOSCOPY (EGD) WITH PROPOFOL
Anesthesia: General

## 2014-09-24 MED ORDER — LIDOCAINE HCL (PF) 2 % IJ SOLN
INTRAMUSCULAR | Status: DC | PRN
  Administered 2014-09-24: 50 mg

## 2014-09-24 MED ORDER — METOPROLOL TARTRATE 1 MG/ML IV SOLN
INTRAVENOUS | Status: DC | PRN
  Administered 2014-09-24: 1 mg via INTRAVENOUS

## 2014-09-24 MED ORDER — PROPOFOL INFUSION 10 MG/ML OPTIME
INTRAVENOUS | Status: DC | PRN
  Administered 2014-09-24: 120 ug/kg/min via INTRAVENOUS

## 2014-09-24 MED ORDER — LABETALOL HCL 5 MG/ML IV SOLN
INTRAVENOUS | Status: DC | PRN
  Administered 2014-09-24: 5 mg via INTRAVENOUS

## 2014-09-24 MED ORDER — FENTANYL CITRATE (PF) 100 MCG/2ML IJ SOLN
INTRAMUSCULAR | Status: DC | PRN
  Administered 2014-09-24: 50 ug via INTRAVENOUS

## 2014-09-24 MED ORDER — SODIUM CHLORIDE 0.9 % IV SOLN
INTRAVENOUS | Status: DC
  Administered 2014-09-24: 1000 mL via INTRAVENOUS
  Administered 2014-09-24: 09:00:00 via INTRAVENOUS

## 2014-09-24 MED ORDER — SODIUM CHLORIDE 0.9 % IV SOLN: INTRAVENOUS | Status: DC

## 2014-09-24 MED ORDER — PROPOFOL 10 MG/ML IV BOLUS
INTRAVENOUS | Status: DC | PRN
  Administered 2014-09-24: 30 mg via INTRAVENOUS

## 2014-09-24 NOTE — Transfer of Care (Signed)
Immediate Anesthesia Transfer of Care Note  Patient: [name]  Procedure(s) Performed: Procedure(s): ESOPHAGOGASTRODUODENOSCOPY (EGD) WITH PROPOFOL (N/A) COLONOSCOPY WITH PROPOFOL (N/A)  Patient Location: PACU  Anesthesia Type:General  Level of Consciousness: awake and alert   Airway & Oxygen Therapy: Patient Spontanous Breathing and Patient connected to nasal cannula oxygen  Post-op Assessment: Report given to RN and Post -op Vital signs reviewed and stable  Post vital signs: Reviewed and stable  Last Vitals:  Filed Vitals:   09/24/14 1001  BP:   Pulse: 67  Temp:   Resp: 17    Complications: No apparent anesthesia complications

## 2014-09-24 NOTE — Op Note (Signed)
[facility] Gastroenterology Patient Name: [name] Procedure Date: 09/24/2014 8:45 AM MRN: [id number] Account #: [id number] Date of Birth: [birth year] Admit Type: Outpatient Age: 69 Room: [facility] ENDO ROOM 1 Gender: Female Note Status: Finalized Procedure:         Colonoscopy Indications:       High risk colon cancer surveillance: Personal history of                     colonic polyps, Personal history of colonic polyps Providers:         Manya Silvas, MD Referring MD:      Ronald Lobo, MD (Referring MD) Medicines:         Propofol per Anesthesia Complications:     No immediate complications. Procedure:         Pre-Anesthesia Assessment:                    - After reviewing the risks and benefits, the patient was                     deemed in satisfactory condition to undergo the procedure.                    After obtaining informed consent, the colonoscope was                     passed under direct vision. Throughout the procedure, the                     patient's blood pressure, pulse, and oxygen saturations                     were monitored continuously. The Colonoscope was                     introduced through the anus and advanced to the the                     transverse colon for evaluation. This was the intended                     extent. The colonoscopy was performed with difficulty due                     to poor bowel prep with stool present. The patient                     tolerated the procedure well. The quality of the bowel                     preparation was poor. Findings:      Internal hemorrhoids were found during endoscopy. The hemorrhoids were       small and Grade I (internal hemorrhoids that do not prolapse).      The prep was POOR and visiblilty was poor and after advancement to about       transverse colon I could see it was getting worse instead of better and       the procedure was aborted. Impression:        -  Preparation of the colon was poor.                    - Internal hemorrhoids.                    -  No specimens collected. Recommendation:    - The findings and recommendations were discussed with the                     patient's family. Manya Silvas, MD 09/24/2014 9:51:30 AM This report has been signed electronically. Number of Addenda: 0 Note Initiated On: 09/24/2014 8:45 AM Total Procedure Duration: 0 hours 12 minutes 36 seconds       [facility]

## 2014-09-24 NOTE — H&P (Signed)
Primary Care Physician:  Morton Peters, MD Primary Gastroenterologist:  Dr. Vira Agar  Pre-Procedure History & Physical: HPI:  [name] is a 69 y.o. female is here for an endoscopy and colonoscopy.   Past Medical History  Diagnosis Date  . Hypertension   . Diabetes mellitus without complication     Past Surgical History  Procedure Laterality Date  . Cardiac catheterization    . Coronary artery bypass graft      Prior to Admission medications   Medication Sig Start Date End Date Taking? Authorizing Provider  amLODipine (NORVASC) 10 MG tablet Take 10 mg by mouth daily.   Yes Historical Provider, MD  aspirin 81 MG tablet Take 81 mg by mouth daily.   Yes Historical Provider, MD  glimepiride (AMARYL) 2 MG tablet Take 1 mg by mouth daily with breakfast. Take 1/2 tablet daily   Yes Historical Provider, MD  hydrALAZINE (APRESOLINE) 100 MG tablet Take 100 mg by mouth 3 (three) times daily.    Yes Historical Provider, MD  isosorbide dinitrate (ISORDIL) 30 MG tablet Take 30 mg by mouth 2 (two) times daily.    Yes Historical Provider, MD  Omega-3 Fatty Acids (FISH OIL PO) Take 1 capsule by mouth daily.   Yes Historical Provider, MD  oxyCODONE-acetaminophen (PERCOCET/ROXICET) 5-325 MG per tablet Take 1 tablet by mouth 3 (three) times daily as needed. pain 07/10/14  Yes Historical Provider, MD  pantoprazole (PROTONIX) 40 MG tablet Take 40 mg by mouth daily.   Yes Historical Provider, MD  ramipril (ALTACE) 5 MG capsule Take 5 mg by mouth daily.    Yes Historical Provider, MD  simvastatin (ZOCOR) 20 MG tablet Take 20 mg by mouth every evening.    Yes Historical Provider, MD  azithromycin (ZITHROMAX) 250 MG tablet One tablet daily orally 09/20/14   Loletha Grayer, MD  cefUROXime (CEFTIN) 500 MG tablet Take 1 tablet (500 mg total) by mouth 2 (two) times daily with a meal. 09/20/14   Loletha Grayer, MD  docusate sodium (COLACE) 100 MG capsule Take 100 mg by mouth 2 (two) times daily.     Historical Provider, MD  feeding supplement, GLUCERNA SHAKE, (GLUCERNA SHAKE) LIQD Take 237 mLs by mouth 2 (two) times daily between meals. 09/20/14   Loletha Grayer, MD  ferrous sulfate 325 (65 FE) MG tablet Take 325 mg by mouth daily.    Historical Provider, MD  furosemide (LASIX) 40 MG tablet Take 1 tablet (40 mg total) by mouth 2 (two) times daily. 09/20/14   Loletha Grayer, MD  labetalol (NORMODYNE) 200 MG tablet Take 1 tablet (200 mg total) by mouth 2 (two) times daily. 09/20/14   Loletha Grayer, MD  meloxicam (MOBIC) 7.5 MG tablet Take 7.5 mg by mouth 2 (two) times daily with a meal.    Historical Provider, MD  potassium chloride SA (K-DUR,KLOR-CON) 20 MEQ tablet Take 1 tablet (20 mEq total) by mouth daily. 09/20/14   Loletha Grayer, MD    Allergies as of 09/05/2014 - Review Complete 07/19/2014  Allergen Reaction Noted  . Codeine      History reviewed. No pertinent family history.  History   Social History  . Marital Status: Married    Spouse Name: N/A  . Number of Children: N/A  . Years of Education: N/A   Occupational History  . Not on file.   Social History Main Topics  . Smoking status: Former Research scientist (life sciences)  . Smokeless tobacco: Not on file  . Alcohol Use: No  .  Drug Use: No  . Sexual Activity: Yes    Birth Control/ Protection: Post-menopausal   Other Topics Concern  . Not on file   Social History Narrative    Review of Systems: See HPI, otherwise negative ROS  Physical Exam: BP 174/71 mmHg  Pulse 85  Temp(Src) 97 F (36.1 C) (Tympanic)  Resp 18  Ht 5\' 7"  (1.702 m)  Wt 69.4 kg (153 lb)  BMI 23.96 kg/m2  SpO2 98% General:   Alert,  pleasant and cooperative in NAD Head:  Normocephalic and atraumatic. Neck:  Supple; no masses or thyromegaly. Lungs:  Clear throughout to auscultation.    Heart:  Regular rate and rhythm. Abdomen:  Soft, nontender and nondistended. Normal bowel sounds, without guarding, and without rebound.   Neurologic:  Alert and  oriented  x4;  grossly normal neurologically.  Pt had an echo done which showed normal function read by Dr. Clayborn Bigness.  Impression/Plan: [name] is here for an endoscopy and colonoscopy to be performed for personal history of colon polyps and weight loss and chronic hoarseness  Risks, benefits, limitations, and alternatives regarding  endoscopy and colonoscopy have been reviewed with the patient.  Questions have been answered.  All parties agreeable.   [name], MD  09/24/2014, 9:17 AM

## 2014-09-24 NOTE — Anesthesia Postprocedure Evaluation (Signed)
  Anesthesia Post-op Note  Patient: [name]  Procedure(s) Performed: Procedure(s): ESOPHAGOGASTRODUODENOSCOPY (EGD) WITH PROPOFOL (N/A) COLONOSCOPY WITH PROPOFOL (N/A)  Anesthesia type:General  Patient location: PACU  Post pain: Pain level controlled  Post assessment: Post-op Vital signs reviewed, Patient's Cardiovascular Status Stable, Respiratory Function Stable, Patent Airway and No signs of Nausea or vomiting  Post vital signs: Reviewed and stable  Last Vitals:  Filed Vitals:   09/24/14 1020  BP: 156/61  Pulse: 71  Temp:   Resp: 20    Level of consciousness: awake, alert  and patient cooperative  Complications: No apparent anesthesia complications

## 2014-09-24 NOTE — Anesthesia Preprocedure Evaluation (Signed)
Anesthesia Evaluation  Patient identified by MRN, date of birth, ID band Patient awake    Reviewed: Allergy & Precautions, H&P , NPO status , Patient's Chart, lab work & pertinent test results, reviewed documented beta blocker date and time   Airway Mallampati: II  TM Distance: >3 FB Neck ROM: full    Dental no notable dental hx.    Pulmonary neg pulmonary ROS, former smoker,  breath sounds clear to auscultation  Pulmonary exam normal       Cardiovascular Exercise Tolerance: Good hypertension, negative cardio ROS  Rhythm:regular Rate:Normal     Neuro/Psych negative neurological ROS  negative psych ROS   GI/Hepatic negative GI ROS, Neg liver ROS,   Endo/Other  negative endocrine ROSdiabetes  Renal/GU negative Renal ROS  negative genitourinary   Musculoskeletal   Abdominal   Peds  Hematology negative hematology ROS (+)   Anesthesia Other Findings   Reproductive/Obstetrics negative OB ROS                             Anesthesia Physical Anesthesia Plan  ASA: II  Anesthesia Plan: General   Post-op Pain Management:    Induction:   Airway Management Planned:   Additional Equipment:   Intra-op Plan:   Post-operative Plan:   Informed Consent: I have reviewed the patients History and Physical, chart, labs and discussed the procedure including the risks, benefits and alternatives for the proposed anesthesia with the patient or authorized representative who has indicated his/her understanding and acceptance.   Dental Advisory Given  Plan Discussed with: CRNA  Anesthesia Plan Comments:         Anesthesia Quick Evaluation

## 2014-09-24 NOTE — Op Note (Signed)
[facility] Gastroenterology Patient Name: [name] Procedure Date: 09/24/2014 8:44 AM MRN: [id number] Account #: [id number] Date of Birth: Oct 24, 1945 Admit Type: Outpatient Age: 69 Room: [facility] ENDO ROOM 1 Gender: Female Note Status: Finalized Procedure:         Upper GI endoscopy Indications:       Weight loss Providers:         Manya Silvas, MD Referring MD:      Ronald Lobo, MD (Referring MD) Medicines:         Propofol per Anesthesia Complications:     No immediate complications. Procedure:         Pre-Anesthesia Assessment:                    - After reviewing the risks and benefits, the patient was                     deemed in satisfactory condition to undergo the procedure.                    After obtaining informed consent, the endoscope was passed                     under direct vision. Throughout the procedure, the                     patient's blood pressure, pulse, and oxygen saturations                     were monitored continuously. The Endoscope was introduced                     through the mouth, and advanced to the second part of                     duodenum. The upper GI endoscopy was accomplished without                     difficulty. The patient tolerated the procedure. Findings:      The examined esophagus was normal. GEJ 38-39cm.      Localized mild inflammation characterized by erythema and granularity       was found in the gastric body. Biopsies were taken with a cold forceps       for histology. Biopsies were taken with a cold forceps for Helicobacter       pylori testing.      The examined jejunum was normal. Both loops of the BR II were examined       and no abnorrmality seen. Impression:        - Normal esophagus.                    - Gastritis. Biopsied.                    - Normal examined jejunum. Recommendation:    - Await pathology results.                    - Perform a colonoscopy as previously  scheduled. Manya Silvas, MD 09/24/2014 9:31:29 AM This report has been signed electronically. Number of Addenda: 0 Note Initiated On: 09/24/2014 8:44 AM      [facility]

## 2014-09-25 ENCOUNTER — Encounter: Payer: Self-pay | Admitting: Unknown Physician Specialty

## 2014-09-25 LAB — SURGICAL PATHOLOGY

## 2014-09-27 ENCOUNTER — Encounter (INDEPENDENT_AMBULATORY_CARE_PROVIDER_SITE_OTHER): Payer: Self-pay

## 2014-09-27 ENCOUNTER — Encounter: Payer: Self-pay | Admitting: Family

## 2014-09-27 ENCOUNTER — Ambulatory Visit: Payer: Medicare HMO | Attending: Family | Admitting: Family

## 2014-09-27 VITALS — BP 145/52 | HR 74 | Resp 20 | Ht 67.0 in | Wt 157.0 lb

## 2014-09-27 DIAGNOSIS — E1141 Type 2 diabetes mellitus with diabetic mononeuropathy: Secondary | ICD-10-CM

## 2014-09-27 DIAGNOSIS — I2510 Atherosclerotic heart disease of native coronary artery without angina pectoris: Secondary | ICD-10-CM | POA: Diagnosis not present

## 2014-09-27 DIAGNOSIS — I10 Essential (primary) hypertension: Secondary | ICD-10-CM | POA: Insufficient documentation

## 2014-09-27 DIAGNOSIS — E119 Type 2 diabetes mellitus without complications: Secondary | ICD-10-CM | POA: Insufficient documentation

## 2014-09-27 DIAGNOSIS — D649 Anemia, unspecified: Secondary | ICD-10-CM | POA: Insufficient documentation

## 2014-09-27 DIAGNOSIS — I509 Heart failure, unspecified: Secondary | ICD-10-CM | POA: Diagnosis present

## 2014-09-27 DIAGNOSIS — I5042 Chronic combined systolic (congestive) and diastolic (congestive) heart failure: Secondary | ICD-10-CM

## 2014-09-27 NOTE — Patient Instructions (Signed)
Weigh daily first thing in the morning and call for an overnight weight gain of >2 pounds or a weekly weight gain of >5 pounds.   Do not add salt to foods. Follow a 2000mg  sodium diet and read food labels.

## 2014-09-27 NOTE — Progress Notes (Signed)
Subjective:    Patient ID: [name], female    DOB: 1946-04-29, 69 y.o.   MRN: [id number]  Congestive Heart Failure Presents for initial visit. The disease course has been stable. Associated symptoms include chest pain (at times), edema, fatigue and shortness of breath. Pertinent negatives include no abdominal pain, orthopnea or palpitations. The symptoms have been stable. The pain is at a severity of 0/10. The pain is mild. The pain is present in the epigastric region. The quality of the pain is described as burning. The pain does not radiate. Chest pain occurs at rest. Past treatments include nothing. Her past medical history is significant for anemia, CAD, DM and HTN.  Shortness of Breath This is a chronic problem. The current episode started more than 1 month ago. The problem occurs daily. The problem has been unchanged. Associated symptoms include chest pain (at times) and leg swelling. Pertinent negatives include no abdominal pain, headaches, leg pain, neck pain, orthopnea, PND or sore throat. The symptoms are aggravated by any activity. She has tried nothing for the symptoms. Her past medical history is significant for CAD.      Review of Systems  Constitutional: Positive for appetite change (decreased) and fatigue.  HENT: Negative for postnasal drip, sore throat and trouble swallowing.   Eyes: Negative.   Respiratory: Positive for shortness of breath. Negative for cough and choking.   Cardiovascular: Positive for chest pain (at times) and leg swelling. Negative for palpitations, orthopnea and PND.  Gastrointestinal: Negative for abdominal pain, diarrhea, constipation and abdominal distention.  Endocrine: Negative.   Genitourinary: Negative.   Musculoskeletal: Positive for back pain. Negative for neck pain.  Skin: Negative.   Allergic/Immunologic: Negative.   Neurological: Positive for weakness. Negative for dizziness, numbness and headaches.  Hematological: Negative for  adenopathy. Does not bruise/bleed easily.  Psychiatric/Behavioral: Positive for sleep disturbance (sleeps on 2 pillows.). Negative for agitation.       Objective:   Physical Exam  Constitutional: She is oriented to person, place, and time. She appears well-developed and well-nourished.  HENT:  Head: Normocephalic and atraumatic.  Eyes: Conjunctivae are normal. Pupils are equal, round, and reactive to light.  Neck: Normal range of motion. Neck supple.  Cardiovascular: Normal rate and regular rhythm.   Pulmonary/Chest: Effort normal and breath sounds normal. She has no wheezes. She has no rales.  Abdominal: Soft. She exhibits no distension. There is no tenderness.  Musculoskeletal: She exhibits edema (2+ pitting edema in bilateral lower legs). She exhibits no tenderness.  Neurological: She is alert and oriented to person, place, and time.  Skin: Skin is warm and dry. No erythema.  Psychiatric: She has a normal mood and affect. Her behavior is normal.  Nursing note and vitals reviewed.         Assessment & Plan:  1: Chronic heart failure with unknown ejection fraction- Patient presents with shortness of breath and fatigue with little exertion. Denies any symptoms at rest. Also has edema in both of her lower legs. She sees her cardiologist tomorrow. Does have support socks at home that she can wear and she was encouraged to put them on daily and remove them at bedtime. Also reminded her to elevate her legs at home. She does have scales at home but says that she doesn't weigh herself daily. Instructed her to weigh herself every day in the morning after using the bathroom and to call for an overnight weight gain of >2 pounds or a weekly weight gain of >  5 pounds. She says that she does add salt to her food and the importance of following a 2000mg  sodium diet was discussed with her. She says that she already reviews food labels. She did not bring her medication with her today so will discuss that  more in detail at her next office visit.  2: HTN- Blood pressure looks good today. Continue medications at this time. 3: Diabetes- She says that her fasting glucose this morning was 124. She says that it's been under pretty good control lately.   Return in 1 month or sooner for any questions/problems before then.

## 2014-10-02 ENCOUNTER — Emergency Department: Payer: Medicare HMO

## 2014-10-02 ENCOUNTER — Inpatient Hospital Stay
Admission: EM | Admit: 2014-10-02 | Discharge: 2014-10-09 | DRG: 377 | Disposition: A | Discharge status: Home / Self Care | Payer: Medicare HMO | Attending: Internal Medicine | Admitting: Internal Medicine

## 2014-10-02 ENCOUNTER — Encounter: Payer: Self-pay | Admitting: Internal Medicine

## 2014-10-02 DIAGNOSIS — K5900 Constipation, unspecified: Secondary | ICD-10-CM | POA: Diagnosis not present

## 2014-10-02 DIAGNOSIS — I5042 Chronic combined systolic (congestive) and diastolic (congestive) heart failure: Secondary | ICD-10-CM | POA: Diagnosis present

## 2014-10-02 DIAGNOSIS — K921 Melena: Principal | ICD-10-CM | POA: Diagnosis present

## 2014-10-02 DIAGNOSIS — Z7982 Long term (current) use of aspirin: Secondary | ICD-10-CM | POA: Diagnosis not present

## 2014-10-02 DIAGNOSIS — J189 Pneumonia, unspecified organism: Secondary | ICD-10-CM | POA: Diagnosis present

## 2014-10-02 DIAGNOSIS — Z79891 Long term (current) use of opiate analgesic: Secondary | ICD-10-CM

## 2014-10-02 DIAGNOSIS — I10 Essential (primary) hypertension: Secondary | ICD-10-CM | POA: Diagnosis present

## 2014-10-02 DIAGNOSIS — Z955 Presence of coronary angioplasty implant and graft: Secondary | ICD-10-CM

## 2014-10-02 DIAGNOSIS — N179 Acute kidney failure, unspecified: Secondary | ICD-10-CM | POA: Diagnosis not present

## 2014-10-02 DIAGNOSIS — I2510 Atherosclerotic heart disease of native coronary artery without angina pectoris: Secondary | ICD-10-CM | POA: Diagnosis present

## 2014-10-02 DIAGNOSIS — I129 Hypertensive chronic kidney disease with stage 1 through stage 4 chronic kidney disease, or unspecified chronic kidney disease: Secondary | ICD-10-CM | POA: Diagnosis present

## 2014-10-02 DIAGNOSIS — J9601 Acute respiratory failure with hypoxia: Secondary | ICD-10-CM | POA: Diagnosis not present

## 2014-10-02 DIAGNOSIS — Y95 Nosocomial condition: Secondary | ICD-10-CM | POA: Diagnosis present

## 2014-10-02 DIAGNOSIS — K922 Gastrointestinal hemorrhage, unspecified: Secondary | ICD-10-CM | POA: Diagnosis present

## 2014-10-02 DIAGNOSIS — E119 Type 2 diabetes mellitus without complications: Secondary | ICD-10-CM

## 2014-10-02 DIAGNOSIS — Z87891 Personal history of nicotine dependence: Secondary | ICD-10-CM | POA: Diagnosis not present

## 2014-10-02 DIAGNOSIS — E785 Hyperlipidemia, unspecified: Secondary | ICD-10-CM | POA: Diagnosis present

## 2014-10-02 DIAGNOSIS — R413 Other amnesia: Secondary | ICD-10-CM | POA: Diagnosis present

## 2014-10-02 DIAGNOSIS — R0789 Other chest pain: Secondary | ICD-10-CM | POA: Diagnosis present

## 2014-10-02 DIAGNOSIS — E875 Hyperkalemia: Secondary | ICD-10-CM | POA: Diagnosis not present

## 2014-10-02 DIAGNOSIS — E11649 Type 2 diabetes mellitus with hypoglycemia without coma: Secondary | ICD-10-CM | POA: Diagnosis present

## 2014-10-02 DIAGNOSIS — R109 Unspecified abdominal pain: Secondary | ICD-10-CM

## 2014-10-02 DIAGNOSIS — R0902 Hypoxemia: Secondary | ICD-10-CM

## 2014-10-02 DIAGNOSIS — N183 Chronic kidney disease, stage 3 (moderate): Secondary | ICD-10-CM | POA: Diagnosis present

## 2014-10-02 DIAGNOSIS — D649 Anemia, unspecified: Secondary | ICD-10-CM | POA: Diagnosis present

## 2014-10-02 DIAGNOSIS — F0390 Unspecified dementia without behavioral disturbance: Secondary | ICD-10-CM | POA: Diagnosis present

## 2014-10-02 DIAGNOSIS — R079 Chest pain, unspecified: Secondary | ICD-10-CM | POA: Diagnosis present

## 2014-10-02 HISTORY — DX: Anemia, unspecified: D64.9

## 2014-10-02 LAB — BASIC METABOLIC PANEL
Anion gap: 8 (ref 5–15)
BUN: 27 mg/dL — AB (ref 6–20)
CALCIUM: 7.8 mg/dL — AB (ref 8.9–10.3)
CO2: 22 mmol/L (ref 22–32)
Chloride: 108 mmol/L (ref 101–111)
Creatinine, Ser: 1.73 mg/dL — ABNORMAL HIGH (ref 0.44–1.00)
GFR, EST AFRICAN AMERICAN: 34 mL/min — AB (ref >60)
GFR, EST NON AFRICAN AMERICAN: 29 mL/min — AB (ref >60)
Glucose, Bld: 96 mg/dL (ref 65–99)
POTASSIUM: 4.5 mmol/L (ref 3.5–5.1)
Sodium: 138 mmol/L (ref 135–145)

## 2014-10-02 LAB — CBC
HCT: 23 % — ABNORMAL LOW (ref 35.0–47.0)
HEMOGLOBIN: 7.4 g/dL — AB (ref 12.0–16.0)
MCH: 25.1 pg — AB (ref 26.0–34.0)
MCHC: 32 g/dL (ref 32.0–36.0)
MCV: 78.6 fL — ABNORMAL LOW (ref 80.0–100.0)
Platelets: 300 10*3/uL (ref 150–440)
RBC: 2.93 MIL/uL — ABNORMAL LOW (ref 3.80–5.20)
RDW: 22.1 % — AB (ref 11.5–14.5)
WBC: 2.2 10*3/uL — ABNORMAL LOW (ref 3.6–11.0)

## 2014-10-02 LAB — TROPONIN I: Troponin I: 0.04 ng/mL — ABNORMAL HIGH (ref <0.031)

## 2014-10-02 LAB — ABO/RH: ABO/RH(D): O POS

## 2014-10-02 LAB — TYPE AND SCREEN
ABO/RH(D): O POS
Antibody Screen: NEGATIVE

## 2014-10-02 LAB — BRAIN NATRIURETIC PEPTIDE: B Natriuretic Peptide: 816 pg/mL — ABNORMAL HIGH (ref 0.0–100.0)

## 2014-10-02 MED ORDER — SODIUM CHLORIDE 0.9 % IV SOLN
8.0000 mg/h | INTRAVENOUS | Status: DC
  Administered 2014-10-02 – 2014-10-05 (×6): 8 mg/h via INTRAVENOUS
  Filled 2014-10-02 (×8): qty 80

## 2014-10-02 MED ORDER — PANTOPRAZOLE SODIUM 40 MG IV SOLR
40.0000 mg | Freq: Two times a day (BID) | INTRAVENOUS | Status: DC
  Administered 2014-10-06 – 2014-10-09 (×7): 40 mg via INTRAVENOUS
  Filled 2014-10-02 (×8): qty 40

## 2014-10-02 MED ORDER — SODIUM CHLORIDE 0.9 % IV SOLN
80.0000 mg | Freq: Once | INTRAVENOUS | Status: AC
  Administered 2014-10-02: 80 mg via INTRAVENOUS
  Filled 2014-10-02: qty 80

## 2014-10-02 NOTE — ED Notes (Signed)
Patient presents with c/o LEFT side CP since noon today. Took SL NTG - pain was moderately relieved. Patient just discharged from [facility] for "her heart" - pending cardiac cath with Dr. Clayborn Bigness to assess need for angioplasty. Patient advising that she had "fluid on heart" while admitted.

## 2014-10-02 NOTE — ED Notes (Signed)
Patient presents to ED with c/o of left sided chest pain beginning today, patient denies radiating pain to other locations. Patient describes pain as "pressure." Patient states was discharged from this hospital on 09/27/14, states was here for pneumonia. Denies shortness of breath, fevers, abdominal pain, dizziness, or other symptoms. Respirations even and unlabored, alert and oriented. Family at bedside.

## 2014-10-02 NOTE — ED Notes (Signed)
Called charge nurse to inform about pt's elevated troponin of 0.04.

## 2014-10-02 NOTE — ED Notes (Signed)
Hospitalist at bedside 

## 2014-10-02 NOTE — ED Provider Notes (Signed)
[facility] Emergency Department Provider Note   ____________________________________________  Time seen: 9 PM I have reviewed the triage vital signs and the triage nursing note.  HISTORY  Chief Complaint Chest Pain and Shortness of Breath   Historian Patient  HPI [name] is a 69 y.o. female who experience a left sided chest pain and pressure this afternoon. It did go away after she took a subungual nitroglycerin. The nitroglycerin is a new perception from her recent hospitalization where she had chest pain shortness of breath and was diagnosed with congestive heart failure and pneumonia. She's not had any additional shortness of breath. She's not had no nausea or sweating. She's not had fever. She did follow up with her cardiologist Dr. Towanda Malkin who told her he will be scheduling an outpatient cardiac catheterization soon. She had an outpatient GI endoscopy and colonoscopy scheduled last week, and she did undergo the endoscopy however she did not do the colonoscopy prep correctly and so that was rescheduled for July. She had had some dark stools. She's not felt dizzy or had any syncope.    Past Medical History  Diagnosis Date  . Hypertension   . Diabetes mellitus without complication   . Anemia   . Coronary artery disease   . CHF (congestive heart failure)   . Glaucoma   . Hyperlipidemia   . Chronic kidney disease   . Neuropathy     Patient Active Problem List   Diagnosis Date Noted  . Chronic combined systolic and diastolic heart failure 21/30/8657  . Pneumonia 09/17/2014  . malignnat HTN 09/17/2014  . HTN (hypertension), malignant 09/17/2014  . DYSPNEA 02/05/2010  . DM 02/04/2010  . ANEMIA, CHRONIC 02/04/2010  . Essential hypertension 02/04/2010  . CAD 02/04/2010    Past Surgical History  Procedure Laterality Date  . Cardiac catheterization    . Esophagogastroduodenoscopy (egd) with propofol N/A 09/24/2014    Procedure:  ESOPHAGOGASTRODUODENOSCOPY (EGD) WITH PROPOFOL;  Surgeon: Manya Silvas, MD;  Location: [place];  Service: Endoscopy;  Laterality: N/A;  . Colonoscopy with propofol N/A 09/24/2014    Procedure: COLONOSCOPY WITH PROPOFOL;  Surgeon: Manya Silvas, MD;  Location: [facility] ENDOSCOPY;  Service: Endoscopy;  Laterality: N/A;  . Coronary angioplasty  2016    stent placed    Current Outpatient Rx  Name  Route  Sig  Dispense  Refill  . amLODipine (NORVASC) 10 MG tablet   Oral   Take 10 mg by mouth daily.         Marland Kitchen aspirin 81 MG tablet   Oral   Take 81 mg by mouth daily.         Marland Kitchen docusate sodium (COLACE) 100 MG capsule   Oral   Take 100 mg by mouth 2 (two) times daily.         . ferrous sulfate 325 (65 FE) MG tablet   Oral   Take 325 mg by mouth daily.         Marland Kitchen glimepiride (AMARYL) 2 MG tablet   Oral   Take 1 mg by mouth daily with breakfast. Take 1/2 tablet daily         . hydrALAZINE (APRESOLINE) 100 MG tablet   Oral   Take 100 mg by mouth 3 (three) times daily.          . isosorbide dinitrate (ISORDIL) 30 MG tablet   Oral   Take 30 mg by mouth 2 (two) times daily.          Marland Kitchen  meloxicam (MOBIC) 7.5 MG tablet   Oral   Take 7.5 mg by mouth 2 (two) times daily with a meal.         . Omega-3 Fatty Acids (FISH OIL PO)   Oral   Take 1 capsule by mouth daily.         Marland Kitchen oxyCODONE-acetaminophen (PERCOCET/ROXICET) 5-325 MG per tablet   Oral   Take 1 tablet by mouth 3 (three) times daily as needed. pain         . pantoprazole (PROTONIX) 40 MG tablet   Oral   Take 40 mg by mouth daily.         . ramipril (ALTACE) 5 MG capsule   Oral   Take 5 mg by mouth daily.          . simvastatin (ZOCOR) 20 MG tablet   Oral   Take 20 mg by mouth every evening.          Marland Kitchen azithromycin (ZITHROMAX) 250 MG tablet      One tablet daily orally Patient not taking: Reported on 09/27/2014   3 each   0   . cefUROXime (CEFTIN) 500 MG tablet   Oral   Take 1  tablet (500 mg total) by mouth 2 (two) times daily with a meal.   16 tablet   0   . feeding supplement, GLUCERNA SHAKE, (GLUCERNA SHAKE) LIQD   Oral   Take 237 mLs by mouth 2 (two) times daily between meals.   60 Can   0   . furosemide (LASIX) 40 MG tablet   Oral   Take 1 tablet (40 mg total) by mouth 2 (two) times daily.   60 tablet   0   . labetalol (NORMODYNE) 200 MG tablet   Oral   Take 1 tablet (200 mg total) by mouth 2 (two) times daily.   60 tablet   0   . potassium chloride SA (K-DUR,KLOR-CON) 20 MEQ tablet   Oral   Take 1 tablet (20 mEq total) by mouth daily.   30 tablet   0     Allergies Review of patient's allergies indicates no known allergies.  Family History  Problem Relation Age of Onset  . Diabetes Mother   . Heart disease Father   . Diabetes Sister   . Lung cancer Brother   . Cancer Brother   . Diabetes Brother   . Diabetes Sister     Social History History  Substance Use Topics  . Smoking status: Former Research scientist (life sciences)  . Smokeless tobacco: Never Used     Comment: quit many years ago  . Alcohol Use: No    Review of Systems  Constitutional: Negative for fever. Eyes: Negative for visual changes. ENT: Negative for sore throat Cardiovascular: No palpitations Respiratory: Negative for shortness of breath. The chest pain was left-sided and slightly pleuritic when it came on and is gone now Gastrointestinal: Negative for abdominal pain, vomiting and diarrhea. Positive for black stools for a couple weeks. Genitourinary: Negative for dysuria. Musculoskeletal: Negative for back pain. Skin: Negative for rash. Neurological: Negative for headaches, focal weakness or numbness.  ____________________________________________   PHYSICAL EXAM:  VITAL SIGNS: ED Triage Vitals  Enc Vitals Group     BP 10/02/14 1855 160/52 mmHg     Pulse Rate 10/02/14 1855 76     Resp 10/02/14 1855 18     Temp 10/02/14 1855 98.3 F (36.8 C)     Temp Source 10/02/14  1855 Oral  SpO2 10/02/14 1855 97 %     Weight 10/02/14 1855 160 lb (72.576 kg)     Height 10/02/14 1855 5\' 7"  (1.702 m)     Head Cir --      Peak Flow --      Pain Score 10/02/14 1859 5     Pain Loc --      Pain Edu? --      Excl. in [place]? --      Constitutional: Alert and oriented. Well appearing and in no distress. Eyes: Conjunctivae are normal. PERRL. Normal extraocular movements. ENT   Head: Normocephalic and atraumatic.   Nose: No congestion/rhinnorhea.   Mouth/Throat: Mucous membranes are moist.   Neck: No stridor. Cardiovascular: Normal rate, regular rhythm.  No murmurs, rubs, or gallops. Respiratory: Normal respiratory effort without tachypnea nor retractions. Breath sounds are clear and equal bilaterally. No wheezes/rales/rhonchi. Gastrointestinal: Soft and nontender. No distention.  Genitourinary: Rectal exam shows small external hemorrhoid which is noninflamed. Nontender rectal exam with no mass. Brown stool which is heme positive with Hemoccult testing Musculoskeletal: Nontender with normal range of motion in all extremities. No joint effusions.  No lower extremity tenderness nor edema. Neurologic:  Normal speech and language. No gross focal neurologic deficits are appreciated. Skin:  Skin is warm, dry and intact. No rash noted. Psychiatric: Mood and affect are normal. Speech and behavior are normal. Patient exhibits appropriate insight and judgment.  ____________________________________________   EKG  I, Lisa Roca, MD, the attending physician have personally viewed and interpreted this ECG.   74 bpm. Normal sinus rhythm with premature atrial, clicks. Left axis deviation. Narrow QRS. Normal ST and T-wave. ____________________________________________  LABS (pertinent positives/negatives)  White blood count 2.2 Hemoglobin 7.4 BUN 27 creatinine 1.73 with electrolytes within normal limits BNP 816 Troponin  0.04  ____________________________________________  RADIOLOGY Radiologist results reviewed  Chest x-ray: Stable pleural effusion no edema or consolidation. __________________________________________  PROCEDURES  Procedure(s) performed: None Critical Care performed: None  ____________________________________________   ED COURSE / ASSESSMENT AND PLAN  Pertinent labs & imaging results that were available during my care of the patient were reviewed by me and considered in my medical decision making (see chart for details).  Patient's an initial complaint was to be evaluated for an episode of chest pain, and her EKG shows no acute signs of ischemia. Her troponin is minimally elevated 0.04, however this appears to be at baseline. When she was recently in the hospital her troponin was as high as 0.14 which was felt to be demand ischemia. The chest discomfort was left-sided but was without any associated symptoms I was slightly pleuritic area however I do not suspect an acute PE given that the pain is relieved after nitroglycerin, and there is no hypoxia, tachycardia, fever, or any additional shortness of breath or pleuritic chest pain now. Of note on her laboratory workup her white blood cell count is down to 2.2, uncertain etiology. Her hemoglobin is anemic to 7.4 with the previous hemoglobin 8.9. This raises suspicion for GI bleeding given her also complaining of dark stools. Her Hemoccult was positive. She had an endoscopy which showed gastritis last week, but did not complete the colonoscopy. I will start on Protonix bolus and drip. No additional aspirin or blood thinners for the chest discomfort given the fact of the acute GI bleeding. Type and screen was added  I will talk with the hospitalist for admission for heme positive stools with new anemia and drop in hemoglobin and observation for  chest pain in a patient with coronary artery  disease.   ___________________________________________   FINAL CLINICAL IMPRESSION(S) / ED DIAGNOSES   Final diagnoses:  Upper GI bleeding  Chest pain of uncertain etiology      Lisa Roca, MD 10/02/14 2141

## 2014-10-03 ENCOUNTER — Encounter: Payer: Self-pay | Admitting: Emergency Medicine

## 2014-10-03 DIAGNOSIS — K921 Melena: Secondary | ICD-10-CM

## 2014-10-03 DIAGNOSIS — K5900 Constipation, unspecified: Secondary | ICD-10-CM

## 2014-10-03 LAB — INFLUENZA PANEL BY PCR (TYPE A & B)
H1N1 flu by pcr: NOT DETECTED
INFLAPCR: NEGATIVE
Influenza B By PCR: NEGATIVE

## 2014-10-03 LAB — CBC
HCT: 24.2 % — ABNORMAL LOW (ref 35.0–47.0)
HCT: 24 % — ABNORMAL LOW (ref 35.0–47.0)
HCT: 24.1 % — ABNORMAL LOW (ref 35.0–47.0)
HEMATOCRIT: 23.9 % — AB (ref 35.0–47.0)
HEMOGLOBIN: 7.5 g/dL — AB (ref 12.0–16.0)
HEMOGLOBIN: 7.6 g/dL — AB (ref 12.0–16.0)
Hemoglobin: 7.6 g/dL — ABNORMAL LOW (ref 12.0–16.0)
Hemoglobin: 7.5 g/dL — ABNORMAL LOW (ref 12.0–16.0)
MCH: 24.8 pg — AB (ref 26.0–34.0)
MCH: 24.3 pg — ABNORMAL LOW (ref 26.0–34.0)
MCH: 24.2 pg — AB (ref 26.0–34.0)
MCH: 24.5 pg — AB (ref 26.0–34.0)
MCHC: 31.9 g/dL — AB (ref 32.0–36.0)
MCHC: 31 g/dL — ABNORMAL LOW (ref 32.0–36.0)
MCHC: 31.2 g/dL — AB (ref 32.0–36.0)
MCHC: 31.8 g/dL — AB (ref 32.0–36.0)
MCV: 77.9 fL — AB (ref 80.0–100.0)
MCV: 78.4 fL — ABNORMAL LOW (ref 80.0–100.0)
MCV: 77.6 fL — ABNORMAL LOW (ref 80.0–100.0)
MCV: 77.2 fL — ABNORMAL LOW (ref 80.0–100.0)
Platelets: 286 10*3/uL (ref 150–440)
Platelets: 295 10*3/uL (ref 150–440)
Platelets: 304 10*3/uL (ref 150–440)
Platelets: 286 10*3/uL (ref 150–440)
RBC: 3.08 MIL/uL — ABNORMAL LOW (ref 3.80–5.20)
RBC: 3.08 MIL/uL — ABNORMAL LOW (ref 3.80–5.20)
RBC: 3.09 MIL/uL — AB (ref 3.80–5.20)
RBC: 3.12 MIL/uL — ABNORMAL LOW (ref 3.80–5.20)
RDW: 23.2 % — AB (ref 11.5–14.5)
RDW: 23.1 % — AB (ref 11.5–14.5)
RDW: 22.2 % — ABNORMAL HIGH (ref 11.5–14.5)
RDW: 23.2 % — ABNORMAL HIGH (ref 11.5–14.5)
WBC: 2 10*3/uL — ABNORMAL LOW (ref 3.6–11.0)
WBC: 2.8 10*3/uL — ABNORMAL LOW (ref 3.6–11.0)
WBC: 3.7 10*3/uL (ref 3.6–11.0)
WBC: 4 10*3/uL (ref 3.6–11.0)

## 2014-10-03 LAB — GLUCOSE, CAPILLARY
GLUCOSE-CAPILLARY: 73 mg/dL (ref 65–99)
Glucose-Capillary: 93 mg/dL (ref 65–99)
Glucose-Capillary: 91 mg/dL (ref 65–99)
Glucose-Capillary: 115 mg/dL — ABNORMAL HIGH (ref 65–99)

## 2014-10-03 LAB — TROPONIN I
Troponin I: 0.05 ng/mL — ABNORMAL HIGH (ref <0.031)
Troponin I: 0.05 ng/mL — ABNORMAL HIGH (ref <0.031)
Troponin I: 0.06 ng/mL — ABNORMAL HIGH (ref <0.031)

## 2014-10-03 LAB — BASIC METABOLIC PANEL
Anion gap: 4 — ABNORMAL LOW (ref 5–15)
BUN: 24 mg/dL — ABNORMAL HIGH (ref 6–20)
CALCIUM: 7.7 mg/dL — AB (ref 8.9–10.3)
CO2: 25 mmol/L (ref 22–32)
Chloride: 109 mmol/L (ref 101–111)
Creatinine, Ser: 1.43 mg/dL — ABNORMAL HIGH (ref 0.44–1.00)
GFR calc Af Amer: 42 mL/min — ABNORMAL LOW (ref >60)
GFR, EST NON AFRICAN AMERICAN: 36 mL/min — AB (ref >60)
GLUCOSE: 111 mg/dL — AB (ref 65–99)
POTASSIUM: 4.4 mmol/L (ref 3.5–5.1)
Sodium: 138 mmol/L (ref 135–145)

## 2014-10-03 MED ORDER — ACETAMINOPHEN 325 MG PO TABS
650.0000 mg | ORAL_TABLET | Freq: Four times a day (QID) | ORAL | Status: DC | PRN
  Filled 2014-10-03: qty 2

## 2014-10-03 MED ORDER — HYDRALAZINE HCL 50 MG PO TABS
100.0000 mg | ORAL_TABLET | Freq: Three times a day (TID) | ORAL | Status: DC
  Administered 2014-10-03 – 2014-10-06 (×8): 100 mg via ORAL
  Filled 2014-10-03 (×9): qty 2

## 2014-10-03 MED ORDER — SIMVASTATIN 20 MG PO TABS
20.0000 mg | ORAL_TABLET | Freq: Every evening | ORAL | Status: DC
  Administered 2014-10-03 – 2014-10-08 (×5): 20 mg via ORAL
  Filled 2014-10-03 (×7): qty 1

## 2014-10-03 MED ORDER — CEFTRIAXONE SODIUM IN DEXTROSE 20 MG/ML IV SOLN
1.0000 g | INTRAVENOUS | Status: DC
  Administered 2014-10-03 – 2014-10-05 (×3): 1 g via INTRAVENOUS
  Filled 2014-10-03 (×4): qty 50

## 2014-10-03 MED ORDER — ACETAMINOPHEN 325 MG PO TABS
650.0000 mg | ORAL_TABLET | Freq: Four times a day (QID) | ORAL | Status: DC | PRN
  Administered 2014-10-03 – 2014-10-07 (×4): 650 mg via ORAL
  Filled 2014-10-03 (×3): qty 2

## 2014-10-03 MED ORDER — AMLODIPINE BESYLATE 10 MG PO TABS
10.0000 mg | ORAL_TABLET | Freq: Every day | ORAL | Status: DC
  Administered 2014-10-03 – 2014-10-06 (×3): 10 mg via ORAL
  Filled 2014-10-03 (×3): qty 1

## 2014-10-03 MED ORDER — GLUCERNA SHAKE PO LIQD
237.0000 mL | Freq: Two times a day (BID) | ORAL | Status: DC
Start: 2014-10-03 — End: 2014-10-09
  Administered 2014-10-03 – 2014-10-09 (×11): 237 mL via ORAL

## 2014-10-03 MED ORDER — LABETALOL HCL 200 MG PO TABS
200.0000 mg | ORAL_TABLET | Freq: Two times a day (BID) | ORAL | Status: DC
  Administered 2014-10-03 – 2014-10-09 (×12): 200 mg via ORAL
  Filled 2014-10-03 (×16): qty 1

## 2014-10-03 MED ORDER — RAMIPRIL 5 MG PO CAPS
5.0000 mg | ORAL_CAPSULE | Freq: Every day | ORAL | Status: DC
  Administered 2014-10-03 – 2014-10-09 (×6): 5 mg via ORAL
  Filled 2014-10-03 (×6): qty 1

## 2014-10-03 MED ORDER — ISOSORBIDE DINITRATE 10 MG PO TABS
30.0000 mg | ORAL_TABLET | Freq: Two times a day (BID) | ORAL | Status: DC
  Administered 2014-10-03 – 2014-10-09 (×11): 30 mg via ORAL
  Filled 2014-10-03 (×11): qty 3
  Filled 2014-10-03: qty 2
  Filled 2014-10-03 (×3): qty 3

## 2014-10-03 MED ORDER — FERROUS SULFATE 325 (65 FE) MG PO TABS
325.0000 mg | ORAL_TABLET | Freq: Every day | ORAL | Status: DC
  Administered 2014-10-03 – 2014-10-07 (×5): 325 mg via ORAL
  Filled 2014-10-03 (×5): qty 1

## 2014-10-03 MED ORDER — INSULIN ASPART 100 UNIT/ML ~~LOC~~ SOLN
0.0000 [IU] | Freq: Three times a day (TID) | SUBCUTANEOUS | Status: DC
  Administered 2014-10-04: 2 [IU] via SUBCUTANEOUS
  Administered 2014-10-05: 1 [IU] via SUBCUTANEOUS
  Filled 2014-10-03: qty 1
  Filled 2014-10-03: qty 2
  Filled 2014-10-03: qty 1

## 2014-10-03 MED ORDER — POTASSIUM CHLORIDE CRYS ER 20 MEQ PO TBCR
20.0000 meq | EXTENDED_RELEASE_TABLET | Freq: Every day | ORAL | Status: DC
  Administered 2014-10-03 – 2014-10-06 (×4): 20 meq via ORAL
  Filled 2014-10-03 (×4): qty 1

## 2014-10-03 MED ORDER — DOCUSATE SODIUM 100 MG PO CAPS
100.0000 mg | ORAL_CAPSULE | Freq: Two times a day (BID) | ORAL | Status: DC
  Administered 2014-10-03 – 2014-10-09 (×12): 100 mg via ORAL
  Filled 2014-10-03 (×14): qty 1

## 2014-10-03 MED ORDER — SODIUM CHLORIDE 0.9 % IJ SOLN
3.0000 mL | Freq: Two times a day (BID) | INTRAMUSCULAR | Status: DC
  Administered 2014-10-04 – 2014-10-05 (×3): 3 mL via INTRAVENOUS

## 2014-10-03 MED ORDER — FUROSEMIDE 40 MG PO TABS
40.0000 mg | ORAL_TABLET | Freq: Two times a day (BID) | ORAL | Status: DC
  Administered 2014-10-03 – 2014-10-05 (×5): 40 mg via ORAL
  Filled 2014-10-03 (×5): qty 1

## 2014-10-03 MED ORDER — GLIMEPIRIDE 1 MG PO TABS
1.0000 mg | ORAL_TABLET | Freq: Every day | ORAL | Status: DC
  Administered 2014-10-03 – 2014-10-06 (×4): 1 mg via ORAL
  Filled 2014-10-03 (×6): qty 1

## 2014-10-03 MED ORDER — ONDANSETRON HCL 4 MG PO TABS: 4.0000 mg | ORAL_TABLET | Freq: Four times a day (QID) | ORAL | Status: DC | PRN

## 2014-10-03 MED ORDER — ONDANSETRON HCL 4 MG/2ML IJ SOLN
4.0000 mg | Freq: Four times a day (QID) | INTRAMUSCULAR | Status: DC | PRN
  Administered 2014-10-04 – 2014-10-06 (×2): 4 mg via INTRAVENOUS
  Filled 2014-10-03 (×2): qty 2

## 2014-10-03 NOTE — H&P (Signed)
[facility] NAME: [name]    MR#:  [id number]  DATE OF BIRTH:  December 13, 1945  DATE OF ADMISSION:  10/02/2014  PRIMARY CARE PHYSICIAN: Morton Peters, MD   REQUESTING/REFERRING PHYSICIAN: Lisa Roca  CHIEF COMPLAINT:   Chief Complaint  Patient presents with  . Chest Pain  . Shortness of Breath    HISTORY OF PRESENT ILLNESS:  [name]  is a 69 y.o. female with below mentioned past medical history presents to the emergency room with the complaints of episode of left-sided chest pain while she was resting at home. No radiation of pain, no palpitations, no dizziness. Mild associated shortness of breath +. She took one tablet of sublingual nitroglycerin following which her chest pain resolved. In the emergency room patient was evaluated by the ED physician and was noted to be with stable vital signs, workup revealed EKG normal sinus rhythm with ventricular rate of 74 bpm with no acute ischemic changes, troponin mild elevation of 0.04 which is chronic. Of note she was admitted to [facility] last week with pneumonia, seen cardiologist after discharge and is scheduled for heart catheterization shortly. She is also seen gastroenterologist for chronic weight loss and has undergone EGD last week which showed some gastritis and attempted colonoscopy was incomplete which is rescheduled at this time. Patient was noted to have a drop in hemoglobin and hematocrit compared to last week admission from 8.9-7.4/23, noted to have heme positive stools by the ED physician, hence hospitalist service was consulted for further evaluation and management. Type and screen was done and patient was started on IV Protonix by the ED physician. Patient is comfortably resting in the bed at this time, denies any complaints including no chest pain or shortness of breath, nausea, vomiting, diarrhea, abdominal pain.  PAST MEDICAL HISTORY:   Past Medical  History  Diagnosis Date  . Hypertension   . Diabetes mellitus without complication   . Anemia   . Coronary artery disease   . CHF (congestive heart failure)   . Glaucoma   . Hyperlipidemia   . Chronic kidney disease   . Neuropathy     PAST SURGICAL HISTORY:   Past Surgical History  Procedure Laterality Date  . Cardiac catheterization    . Esophagogastroduodenoscopy (egd) with propofol N/A 09/24/2014    Procedure: ESOPHAGOGASTRODUODENOSCOPY (EGD) WITH PROPOFOL;  Surgeon: Manya Silvas, MD;  Location: [facility];  Service: Endoscopy;  Laterality: N/A;  . Colonoscopy with propofol N/A 09/24/2014    Procedure: COLONOSCOPY WITH PROPOFOL;  Surgeon: Manya Silvas, MD;  Location: [facility] ENDOSCOPY;  Service: Endoscopy;  Laterality: N/A;  . Coronary angioplasty  2016    stent placed    SOCIAL HISTORY:   History  Substance Use Topics  . Smoking status: Former Research scientist (life sciences)  . Smokeless tobacco: Never Used     Comment: quit many years ago  . Alcohol Use: No    FAMILY HISTORY:   Family History  Problem Relation Age of Onset  . Diabetes Mother   . Heart disease Father   . Diabetes Sister   . Lung cancer Brother   . Cancer Brother   . Diabetes Brother   . Diabetes Sister     DRUG ALLERGIES:  No Known Allergies  REVIEW OF SYSTEMS:   Review of Systems  Constitutional: Negative for fever, chills and malaise/fatigue.  HENT: Negative for ear pain, hearing loss, nosebleeds, sore throat and tinnitus.   Eyes: Negative  for blurred vision, double vision, pain, discharge and redness.  Respiratory: Negative for cough, hemoptysis, sputum production, shortness of breath and wheezing.   Cardiovascular: Negative for chest pain, palpitations, orthopnea and leg swelling.       Episode of left-sided chest pain as noted in history of present illness which resolved with nitroglycerin tablet. Currently no chest pain.  Gastrointestinal: Positive for melena. Negative for nausea, vomiting,  abdominal pain, diarrhea, constipation and blood in stool.  Genitourinary: Negative for dysuria, urgency, frequency and hematuria.  Musculoskeletal: Negative for back pain, joint pain and neck pain.  Skin: Negative for itching and rash.  Neurological: Negative for dizziness, tingling, sensory change, focal weakness and seizures.  Endo/Heme/Allergies: Does not bruise/bleed easily.  Psychiatric/Behavioral: Negative for depression. The patient is not nervous/anxious.     MEDICATIONS AT HOME:   Prior to Admission medications   Medication Sig Start Date End Date Taking? Authorizing Provider  amLODipine (NORVASC) 10 MG tablet Take 10 mg by mouth daily.   Yes Historical Provider, MD  aspirin 81 MG tablet Take 81 mg by mouth daily.   Yes Historical Provider, MD  docusate sodium (COLACE) 100 MG capsule Take 100 mg by mouth 2 (two) times daily.   Yes Historical Provider, MD  ferrous sulfate 325 (65 FE) MG tablet Take 325 mg by mouth daily.   Yes Historical Provider, MD  glimepiride (AMARYL) 2 MG tablet Take 1 mg by mouth daily with breakfast. Take 1/2 tablet daily   Yes Historical Provider, MD  hydrALAZINE (APRESOLINE) 100 MG tablet Take 100 mg by mouth 3 (three) times daily.    Yes Historical Provider, MD  isosorbide dinitrate (ISORDIL) 30 MG tablet Take 30 mg by mouth 2 (two) times daily.    Yes Historical Provider, MD  meloxicam (MOBIC) 7.5 MG tablet Take 7.5 mg by mouth 2 (two) times daily with a meal.   Yes Historical Provider, MD  Omega-3 Fatty Acids (FISH OIL PO) Take 1 capsule by mouth daily.   Yes Historical Provider, MD  oxyCODONE-acetaminophen (PERCOCET/ROXICET) 5-325 MG per tablet Take 1 tablet by mouth 3 (three) times daily as needed. pain 07/10/14  Yes Historical Provider, MD  pantoprazole (PROTONIX) 40 MG tablet Take 40 mg by mouth daily.   Yes Historical Provider, MD  ramipril (ALTACE) 5 MG capsule Take 5 mg by mouth daily.    Yes Historical Provider, MD  simvastatin (ZOCOR) 20 MG tablet  Take 20 mg by mouth every evening.    Yes Historical Provider, MD  azithromycin (ZITHROMAX) 250 MG tablet One tablet daily orally Patient not taking: Reported on 09/27/2014 09/20/14   Loletha Grayer, MD  cefUROXime (CEFTIN) 500 MG tablet Take 1 tablet (500 mg total) by mouth 2 (two) times daily with a meal. 09/20/14   Loletha Grayer, MD  feeding supplement, GLUCERNA SHAKE, (GLUCERNA SHAKE) LIQD Take 237 mLs by mouth 2 (two) times daily between meals. 09/20/14   Loletha Grayer, MD  furosemide (LASIX) 40 MG tablet Take 1 tablet (40 mg total) by mouth 2 (two) times daily. 09/20/14   Loletha Grayer, MD  labetalol (NORMODYNE) 200 MG tablet Take 1 tablet (200 mg total) by mouth 2 (two) times daily. 09/20/14   Loletha Grayer, MD  potassium chloride SA (K-DUR,KLOR-CON) 20 MEQ tablet Take 1 tablet (20 mEq total) by mouth daily. 09/20/14   Loletha Grayer, MD      VITAL SIGNS:  Blood pressure 168/77, pulse 117, resp. rate 18, SpO2 96 %.  PHYSICAL EXAMINATION:  Physical Exam  Constitutional: She is oriented to person, place, and time. She appears well-developed and well-nourished. No distress.  HENT:  Head: Normocephalic and atraumatic.  Right Ear: External ear normal.  Left Ear: External ear normal.  Nose: Nose normal.  Mouth/Throat: Oropharynx is clear and moist. No oropharyngeal exudate.  Eyes: EOM are normal. Pupils are equal, round, and reactive to light. No scleral icterus.  Neck: Normal range of motion. Neck supple. No JVD present. No thyromegaly present.  Cardiovascular: Normal rate, regular rhythm, normal heart sounds and intact distal pulses.  Exam reveals no friction rub.   No murmur heard. Respiratory: Effort normal and breath sounds normal. No respiratory distress. She has no wheezes. She has no rales. She exhibits no tenderness.  GI: Soft. Bowel sounds are normal. She exhibits no distension and no mass. There is no tenderness. There is no rebound and no guarding.  Musculoskeletal:  Normal range of motion. She exhibits no edema.  Lymphadenopathy:    She has no cervical adenopathy.  Neurological: She is alert and oriented to person, place, and time. She has normal reflexes. She displays normal reflexes. No cranial nerve deficit. She exhibits normal muscle tone.  Skin: Skin is warm. No rash noted. No erythema.  Psychiatric: She has a normal mood and affect. Her behavior is normal. Thought content normal.   LABORATORY PANEL:   CBC  Recent Labs Lab 10/02/14 1900  WBC 2.2*  HGB 7.4*  HCT 23.0*  PLT 300   ------------------------------------------------------------------------------------------------------------------  Chemistries   Recent Labs Lab 10/02/14 1900  NA 138  K 4.5  CL 108  CO2 22  GLUCOSE 96  BUN 27*  CREATININE 1.73*  CALCIUM 7.8*   ------------------------------------------------------------------------------------------------------------------  Cardiac Enzymes  Recent Labs Lab 10/02/14 1900  TROPONINI 0.04*   ------------------------------------------------------------------------------------------------------------------  RADIOLOGY:  Dg Chest 2 View  10/02/2014   CLINICAL DATA:  Chest pain and shortness of breath  EXAM: CHEST  2 VIEW  COMPARISON:  Sep 17, 2014  FINDINGS: There is a stable pleural effusion on the right. Lungs elsewhere are clear. Heart is upper normal in size with pulmonary vascularity within normal limits. No adenopathy. No bone lesions.  IMPRESSION: Fairly small pleural effusion on the right, stable. No edema or consolidation. No change in cardiac silhouette.   Electronically Signed   By: Lowella Grip III M.D.   On: 10/02/2014 19:48    EKG:   Orders placed or performed during the hospital encounter of 10/02/14  . ED EKG (<59mins upon arrival to the ED) normal sinus rhythm with ventricular rate of 74 bpm, no acute ischemic changes.   . ED EKG (<39mins upon arrival to the ED)  . EKG 12-Lead  . EKG 12-Lead     IMPRESSION AND PLAN:   1. Melena with a drop in hemoglobin. Patient hemodynamically stable. 2. Episode of left-sided chest pain, relieved with sublingual nitroglycerin. EKG no acute ischemic changes, troponin mild elevation of 0.04 which is chronic. Patient is chest pain-free at present. 3. Chronic anemia, drop in H&H. Patient hemodynamically stable. Needs further workup. 4. History of coronary artery disease status post stent, seen by cardiology last week, recent echo normal, patient was scheduled for cardiac catheterization shortly. 5. Hypertension, stable on home medications. 6. Diabetes mellitus type 2, stable on home medications. 7. CK D, creatinine mild elevation in stable at baseline. 8. History of congestive heart failure, combined. No acute problems at present.   Plan: Admit to telemetry, continue nitroglycerin, beta blocker. No aspirin or heparin because of  acute GI bleed. Cycle cardiac enzymes. Cardiology consultation for further advice. IV Protonix, monitor serial CBC, GI consultation for further workup of anemia. Continue home medications for hypertension, diabetes. Sliding scale insulin. Monitor BMP closely.    All the records are reviewed and case discussed with ED provider. Management plans discussed with the patient, family and they are in agreement.  CODE STATUS: Full code  TOTAL TIME TAKING CARE OF THIS PATIENT: 50 minutes.    Azucena Freed N M.D on 10/03/2014 at 12:30 AM  Between 7am to 6pm - Pager - [phone]  After 6pm go to [email or web address] - password [email or web address] [facility]  Office  [phone]  CC: Primary care physician; Morton Peters, MD

## 2014-10-03 NOTE — ED Notes (Addendum)
Spoke with Dr. Reece Levy, patient heart rate in the 110"s. 200 mg Labetalol ordered for patient.

## 2014-10-03 NOTE — Consult Note (Signed)
Gastroenterology Consultation  Referring Provider:     No ref. provider found Primary Care Physician:  Morton Peters, MD Primary Gastroenterologist:  Dr. Vira Agar       Reason for Consultation:     Melena, Anemia  Date of Admission:  10/02/2014 Date of Consultation:  10/03/2014         HPI:   [name] is a 69 y.o. female admitted with chest pain & noted to have worsening anemia with melena.  Patient had EGD which showed iron pill gastritis & normal Bilroth II changes with attempted colonoscopy by Dr Vira Agar 09/24/14.  H pylori was negative.  Colonoscopy was incomplete due to poor prep but showed hemorrhoids.  Hgb 7.5 now, 7.4 yesterday.  5/16 Hgb was 8.9 & she runs chronically in 9-10 range over past year.  She has also had leukocytosis with WBC 2.8 today.  TSH normal 5/16.  Pt has been started on IV protonix.  Pt takes mobic at home for arthritis once daily.  Pt is unsure if or why she may have had stomach surgery in the past to correlate with anatomy seen on EGD by Dr. Vira Agar.  Patient was admitted with left-sided chest pain and has been evaluated by cardiology. She also had shortness of breath prior to admission. Today she is somewhat disoriented and has been febrile with a MAXIMUM TEMPERATURE of 101.3.  She has been tachycardic. She was admitted last week with pneumonia.  Denies constipation or diarrhea.  Denies heartburn, indigestion, nausea, vomiting, dysphagia, odynophagia or anorexia.  Past Medical History  Diagnosis Date  . Hypertension   . Diabetes mellitus without complication   . Anemia   . Coronary artery disease   . CHF (congestive heart failure)   . Glaucoma   . Hyperlipidemia   . Chronic kidney disease   . Neuropathy   . Chronic anemia     Past Surgical History  Procedure Laterality Date  . Cardiac catheterization    . Esophagogastroduodenoscopy (egd) with propofol N/A 09/24/2014    Elliott-gastritis & normal Billroth II changes   . Colonoscopy with  propofol N/A 09/24/2014    Elliott-Incomplete secondary to prep  . Coronary angioplasty  2016    stent placed  . Bilroth ii procedure      Prior to Admission medications   Medication Sig Start Date End Date Taking? Authorizing Provider  amLODipine (NORVASC) 10 MG tablet Take 10 mg by mouth daily.   Yes Historical Provider, MD  aspirin 81 MG tablet Take 81 mg by mouth daily.   Yes Historical Provider, MD  docusate sodium (COLACE) 100 MG capsule Take 100 mg by mouth 2 (two) times daily.   Yes Historical Provider, MD  ferrous sulfate 325 (65 FE) MG tablet Take 325 mg by mouth daily.   Yes Historical Provider, MD  glimepiride (AMARYL) 2 MG tablet Take 1 mg by mouth daily with breakfast. Take 1/2 tablet daily   Yes Historical Provider, MD  hydrALAZINE (APRESOLINE) 100 MG tablet Take 100 mg by mouth 3 (three) times daily.    Yes Historical Provider, MD  isosorbide dinitrate (ISORDIL) 30 MG tablet Take 30 mg by mouth 2 (two) times daily.    Yes Historical Provider, MD  meloxicam (MOBIC) 7.5 MG tablet Take 7.5 mg by mouth 2 (two) times daily with a meal.   Yes Historical Provider, MD  Omega-3 Fatty Acids (FISH OIL PO) Take 1 capsule by mouth daily.   Yes Historical Provider, MD  oxyCODONE-acetaminophen (PERCOCET/ROXICET) 5-325 MG per tablet Take 1 tablet by mouth 3 (three) times daily as needed. pain 07/10/14  Yes Historical Provider, MD  pantoprazole (PROTONIX) 40 MG tablet Take 40 mg by mouth daily.   Yes Historical Provider, MD  ramipril (ALTACE) 5 MG capsule Take 5 mg by mouth daily.    Yes Historical Provider, MD  simvastatin (ZOCOR) 20 MG tablet Take 20 mg by mouth every evening.    Yes Historical Provider, MD  azithromycin (ZITHROMAX) 250 MG tablet One tablet daily orally Patient not taking: Reported on 09/27/2014 09/20/14   Loletha Grayer, MD  cefUROXime (CEFTIN) 500 MG tablet Take 1 tablet (500 mg total) by mouth 2 (two) times daily with a meal. 09/20/14   Loletha Grayer, MD  feeding  supplement, GLUCERNA SHAKE, (GLUCERNA SHAKE) LIQD Take 237 mLs by mouth 2 (two) times daily between meals. 09/20/14   Loletha Grayer, MD  furosemide (LASIX) 40 MG tablet Take 1 tablet (40 mg total) by mouth 2 (two) times daily. 09/20/14   Loletha Grayer, MD  labetalol (NORMODYNE) 200 MG tablet Take 1 tablet (200 mg total) by mouth 2 (two) times daily. 09/20/14   Loletha Grayer, MD  potassium chloride SA (K-DUR,KLOR-CON) 20 MEQ tablet Take 1 tablet (20 mEq total) by mouth daily. 09/20/14   Loletha Grayer, MD    Family History  Problem Relation Age of Onset  . Diabetes Mother   . Heart disease Father   . Diabetes Sister   . Lung cancer Brother   . Cancer Brother   . Diabetes Brother   . Diabetes Sister    There is no known family history of colorectal carcinoma , liver disease, or inflammatory bowel disease.   History  Substance Use Topics  . Smoking status: Former Research scientist (life sciences)  . Smokeless tobacco: Never Used     Comment: quit many years ago  . Alcohol Use: No    Allergies as of 10/02/2014  . (No Known Allergies)    Review of Systems:    All systems reviewed and negative except where noted in HPI.   Physical Exam:  Vital signs in last 24 hours: Temp:  [98.3 F (36.8 C)-101.3 F (38.5 C)] 101.3 F (38.5 C) (06/01 1537) Pulse Rate:  [76-121] 116 (06/01 1105) Resp:  [16-23] 18 (06/01 1105) BP: (139-179)/(52-83) 143/56 mmHg (06/01 1105) SpO2:  [94 %-100 %] 94 % (06/01 1105) Weight:  [64.955 kg (143 lb 3.2 oz)-72.576 kg (160 lb)] 64.955 kg (143 lb 3.2 oz) (06/01 0355) Last BM Date: 10/02/14  Body mass index is 22.42 kg/(m^2). General:   Alert,  Well-developed, thin, pleasant and cooperative in NAD, husband at the bedside Head:  Normocephalic and atraumatic. Eyes:  Sclera clear, no icterus.   Conjunctiva pink. Ears:  Normal auditory acuity. Nose:  No deformity, discharge, or lesions. Mouth:  No deformity or lesions,oropharynx pink & moist. Neck:  Supple; no masses or  thyromegaly. Lungs:  Respirations even and unlabored.  Clear throughout to auscultation.   No wheezes, crackles, or rhonchi. No acute distress. Heart:  Regular rate and rhythm; no murmurs, clicks, rubs, or gallops. Abdomen:  Normal bowel sounds.  No bruits.  Soft, non-tender and non-distended without masses, hepatosplenomegaly or hernias noted.  No guarding or rebound tenderness.     Rectal:  Deferred.  Msk:  Symmetrical without gross deformities.  Good, equal movement & strength bilaterally. Pulses:  Normal pulses noted. Extremities:  No clubbing or edema.  No cyanosis. Neurologic:  Alert and oriented x1;  grossly normal neurologically. Skin:  Intact without significant lesions or rashes.  No jaundice. Lymph Nodes:  No significant cervical adenopathy. Psych:  Alert and cooperative. depressed mood and affect.  LAB RESULTS:  Recent Labs  10/03/14 0418 10/03/14 1008 10/03/14 1517  WBC 2.0* 2.8* 3.7  HGB 7.6* 7.5* 7.5*  HCT 23.9* 24.2* 24.0*  PLT 286 295 304   BMET  Recent Labs  10/02/14 1900 10/03/14 0418  NA 138 138  K 4.5 4.4  CL 108 109  CO2 22 25  GLUCOSE 96 111*  BUN 27* 24*  CREATININE 1.73* 1.43*  CALCIUM 7.8* 7.7*   STUDIES: Dg Chest 2 View  10/02/2014   CLINICAL DATA:  Chest pain and shortness of breath  EXAM: CHEST  2 VIEW  COMPARISON:  Sep 17, 2014  FINDINGS: There is a stable pleural effusion on the right. Lungs elsewhere are clear. Heart is upper normal in size with pulmonary vascularity within normal limits. No adenopathy. No bone lesions.  IMPRESSION: Fairly small pleural effusion on the right, stable. No edema or consolidation. No change in cardiac silhouette.   Electronically Signed   By: Lowella Grip III M.D.   On: 10/02/2014 19:48      Impression / Plan:   [name] is a 69 y.o. y/o female with chest pain and shortness of breath. She is febrile. She has had recent pneumonia. She is found to be Hemoccult-positive although this could be due to  hemorrhoids noted on recent colonoscopy and we will need to follow her hemoglobin further and monitor for acute GI bleed as she has a chronic anemia with a hemoglobin 8-10 range.  She has only seen one episode of dark stool.   She just had a recent EGD by Dr. Vira Agar which was normal for unintentional weight loss,.  There was no evidence of peptic ulcer disease.  She also has leukocytosis that has been chronic.  We will continue to follow her this admission.  Plan: #1 agree with Protonix drip #2 hemoglobin and hematocrit in the morning #3 watch for gross GI bleeding #4 she has outpatient colonoscopy scheduled with Dr. Vira Agar   Thank you for involving me in the care of this patient.      LOS: 1 day   Vickey Huger, NP  10/03/2014, 4:39 PM [facility]  [place], [place] [address] Phone: [phone] Fax : [phone]   Addendum:  5:04 PM  Discussed her condition including fever & chest pain with Dr Vianne Bulls & Dr Allen Norris.

## 2014-10-03 NOTE — Consult Note (Signed)
CARDIOLOGY CONSULT NOTE  Patient ID: [name] MRN: [id number] DOB/AGE: 09-Aug-1945 69 y.o.  Admit date: 10/02/2014 Referring Physician Vianne Bulls Primary Physician   Primary Cardiologist [facility] Reason for Consultation chest pain  HPI: Patient is a 69 year old African American female with history of coronary disease.  She has had episodic chest discomfort.  She was recently admitted to [facility] where she ruled out for myocardial infarction.  She was discharged to home   where an outpatient functional study done in April, 2016 revealed normal LV function with no evidence of ischemia.  She continued to have chest pain.  She was treated with amlodipine, simvastatin, ramipril, labetalol, isosorbide dinitrate.  She was being considered for outpatient cardiac catheterization given her continued chest pain despite a negative functional study in a negative rule out protocol in the hospital.  She is now admitted with chest pain and noted to have significant anemia with hemoglobin in the mid 7 range.  She also had dark tarry stools.  She was felt to be having a GI bleed. She has ruled out for myocardial infarction.  Her chest pain has improved.  ROS Review of Systems - General ROS: negative for - chills, fatigue or night sweats Respiratory ROS: positive for - shortness of breath negative for - cough, orthopnea, sputum changes or stridor Cardiovascular ROS: positive for - chest pain Gastrointestinal ROS: positive for - blood in stools, change in stools and melena negative for - abdominal pain or appetite loss Musculoskeletal ROS: negative Neurological ROS: no TIA or stroke symptoms   Past Medical History  Diagnosis Date  . Hypertension   . Diabetes mellitus without complication   . Anemia   . Coronary artery disease   . CHF (congestive heart failure)   . Glaucoma   . Hyperlipidemia   . Chronic kidney disease   . Neuropathy     Family History  Problem Relation Age of Onset  . Diabetes  Mother   . Heart disease Father   . Diabetes Sister   . Lung cancer Brother   . Cancer Brother   . Diabetes Brother   . Diabetes Sister     History   Social History  . Marital Status: Married    Spouse Name: N/A  . Number of Children: N/A  . Years of Education: N/A   Occupational History  . Not on file.   Social History Main Topics  . Smoking status: Former Research scientist (life sciences)  . Smokeless tobacco: Never Used     Comment: quit many years ago  . Alcohol Use: No  . Drug Use: No  . Sexual Activity: Yes    Birth Control/ Protection: Post-menopausal   Other Topics Concern  . Not on file   Social History Narrative    Past Surgical History  Procedure Laterality Date  . Cardiac catheterization    . Esophagogastroduodenoscopy (egd) with propofol N/A 09/24/2014    Elliott-gastritis & normal Billroth II changes   . Colonoscopy with propofol N/A 09/24/2014    Elliott-Incomplete secondary to prep  . Coronary angioplasty  2016    stent placed     Prescriptions prior to admission  Medication Sig Dispense Refill Last Dose  . amLODipine (NORVASC) 10 MG tablet Take 10 mg by mouth daily.   10/02/2014 at 0800  . aspirin 81 MG tablet Take 81 mg by mouth daily.   10/02/2014 at 0800  . docusate sodium (COLACE) 100 MG capsule Take 100 mg by mouth 2 (two) times daily.   10/02/2014  at 0800  . ferrous sulfate 325 (65 FE) MG tablet Take 325 mg by mouth daily.   10/02/2014 at 0800  . glimepiride (AMARYL) 2 MG tablet Take 1 mg by mouth daily with breakfast. Take 1/2 tablet daily   10/02/2014 at 0800  . hydrALAZINE (APRESOLINE) 100 MG tablet Take 100 mg by mouth 3 (three) times daily.    10/02/2014 at 0800  . isosorbide dinitrate (ISORDIL) 30 MG tablet Take 30 mg by mouth 2 (two) times daily.    10/02/2014 at 0800  . meloxicam (MOBIC) 7.5 MG tablet Take 7.5 mg by mouth 2 (two) times daily with a meal.   10/02/2014 at 0800  . Omega-3 Fatty Acids (FISH OIL PO) Take 1 capsule by mouth daily.   10/02/2014 at 0800  .  oxyCODONE-acetaminophen (PERCOCET/ROXICET) 5-325 MG per tablet Take 1 tablet by mouth 3 (three) times daily as needed. pain   10/02/2014 at 0800  . pantoprazole (PROTONIX) 40 MG tablet Take 40 mg by mouth daily.   10/02/2014 at 0800  . ramipril (ALTACE) 5 MG capsule Take 5 mg by mouth daily.    10/02/2014 at 0800  . simvastatin (ZOCOR) 20 MG tablet Take 20 mg by mouth every evening.    10/02/2014 at 0800  . azithromycin (ZITHROMAX) 250 MG tablet One tablet daily orally (Patient not taking: Reported on 09/27/2014) 3 each 0 Not Taking  . cefUROXime (CEFTIN) 500 MG tablet Take 1 tablet (500 mg total) by mouth 2 (two) times daily with a meal. 16 tablet 0   . feeding supplement, GLUCERNA SHAKE, (GLUCERNA SHAKE) LIQD Take 237 mLs by mouth 2 (two) times daily between meals. 60 Can 0   . furosemide (LASIX) 40 MG tablet Take 1 tablet (40 mg total) by mouth 2 (two) times daily. 60 tablet 0   . labetalol (NORMODYNE) 200 MG tablet Take 1 tablet (200 mg total) by mouth 2 (two) times daily. 60 tablet 0 09/23/2014  . potassium chloride SA (K-DUR,KLOR-CON) 20 MEQ tablet Take 1 tablet (20 mEq total) by mouth daily. 30 tablet 0     Physical Exam: Blood pressure 143/56, pulse 116, temperature 98.9 F (37.2 C), temperature source Oral, resp. rate 18, height 5\' 7"  (1.702 m), weight 64.955 kg (143 lb 3.2 oz), SpO2 94 %.  General appearance: alert and cooperative Head: Normocephalic, without obvious abnormality, atraumatic Neck: no adenopathy, no carotid bruit, no JVD, supple, symmetrical, trachea midline and thyroid not enlarged, symmetric, no tenderness/mass/nodules Resp: clear to auscultation bilaterally Chest wall: no tenderness Cardio: regular rate and rhythm, S1, S2 normal, no murmur, click, rub or gallop GI: normal findings: bowel sounds normal  Extremities: extremities normal, atraumatic, no cyanosis or edema Neurologic: Grossly normal Labs:   Lab Results  Component Value Date   WBC 2.8* 10/03/2014   HGB  7.5* 10/03/2014   HCT 24.2* 10/03/2014   MCV 78.4* 10/03/2014   PLT 295 10/03/2014     Recent Labs Lab 10/03/14 0418  NA 138  K 4.4  CL 109  CO2 25  BUN 24*  CREATININE 1.43*  CALCIUM 7.7*  GLUCOSE 111*   Lab Results  Component Value Date   TROPONINI 0.05* 10/03/2014     EKG:   Sinus rhythm with no ischemia  ASSESSMENT AND PLAN:   Patient with history of coronary disease and chest pain with typical atypical features.  She has ruled out for a myocardial infarction.  Recent functional study was negative for ischemia.  She was tentatively scheduled for  an outpatient cardiac catheterization due to persistent intermittent chest pain.  She is now admitted with dark stools and chest pain.  She ruled out from myocardia infarction but has evidence of lower extremity GI bleed with a hemoglobin less than 8. She has dark stools.  She is not a candidate for invasive evaluation at present due to the fact that EF abnormal, she would require stenting with placement of dual anti-platelet therapy.  Given her negative functional study, she would have a low probability of significant disease.  Would recommend avoiding nonsteroidals and anti-platelet therapy and continuing to workup be etiology of GI bleed.  Would defer invasive evaluation from a cardiac standpoint until her GI status is stable. Signed: Teodoro Spray MD, [facility] 10/03/2014, 3:33 PM

## 2014-10-03 NOTE — Progress Notes (Signed)
[place] at [place] NAME: [name]    MR#:  [id number]  DATE OF BIRTH:  04-07-1946  SUBJECTIVE:Admitted for chest pain/black stool/acute on chronic anemia.temp 101F this afternoon.but according to Rn room was set to 51 F and she was covered with lot of blankets.  CHIEF COMPLAINT:   Chief Complaint  Patient presents with  . Chest Pain  . Shortness of Breath    REVIEW OF SYSTEMS:    Review of Systems  Constitutional: Negative for fever and chills.  HENT: Negative for hearing loss.   Eyes: Negative for blurred vision, double vision and photophobia.  Respiratory: Negative for cough, hemoptysis and shortness of breath.   Cardiovascular: Negative for palpitations, orthopnea and leg swelling.  Gastrointestinal: Negative for vomiting, abdominal pain and diarrhea.  Genitourinary: Negative for dysuria and urgency.  Musculoskeletal: Negative for myalgias and neck pain.  Skin: Negative for rash.  Neurological: Negative for dizziness, focal weakness, seizures, weakness and headaches.  Psychiatric/Behavioral: Negative for memory loss. The patient does not have insomnia.     Nutrition: liquid diet Tolerating Diet: Tolerating PT:      DRUG ALLERGIES:  No Known Allergies  VITALS:  Blood pressure 143/56, pulse 116, temperature 101.3 F (38.5 C), temperature source Oral, resp. rate 18, height 5\' 7"  (1.702 m), weight 64.955 kg (143 lb 3.2 oz), SpO2 94 %.  PHYSICAL EXAMINATION:   Physical Exam  GENERAL:  69 y.o.-year-old patient lying in the bed with no acute distress.  EYES: Pupils equal, round, reactive to light and accommodation. No scleral icterus. Extraocular muscles intact.  HEENT: Head atraumatic, normocephalic. Oropharynx and nasopharynx clear.  NECK:  Supple, no jugular venous distention. No thyroid enlargement, no tenderness.  LUNGS: Normal breath sounds bilaterally, no wheezing, rales,rhonchi or crepitation. No use of  accessory muscles of respiration.  CARDIOVASCULAR: S1, S2 normal. No murmurs, rubs, or gallops.  ABDOMEN: Soft, nontender, nondistended. Bowel sounds present. No organomegaly or mass.  EXTREMITIES: No pedal edema, cyanosis, or clubbing.  NEUROLOGIC: Cranial nerves II through XII are intact. Muscle strength 5/5 in all extremities. Sensation intact. Gait not checked.  PSYCHIATRIC: The patient is alert and oriented x 3.  SKIN: No obvious rash, lesion, or ulcer.    LABORATORY PANEL:   CBC  Recent Labs Lab 10/03/14 1517  WBC 3.7  HGB 7.5*  HCT 24.0*  PLT 304   ------------------------------------------------------------------------------------------------------------------  Chemistries   Recent Labs Lab 10/03/14 0418  NA 138  K 4.4  CL 109  CO2 25  GLUCOSE 111*  BUN 24*  CREATININE 1.43*  CALCIUM 7.7*   ------------------------------------------------------------------------------------------------------------------  Cardiac Enzymes  Recent Labs Lab 10/03/14 1008  TROPONINI 0.05*   ------------------------------------------------------------------------------------------------------------------  RADIOLOGY:  Dg Chest 2 View  10/02/2014   CLINICAL DATA:  Chest pain and shortness of breath  EXAM: CHEST  2 VIEW  COMPARISON:  Sep 17, 2014  FINDINGS: There is a stable pleural effusion on the right. Lungs elsewhere are clear. Heart is upper normal in size with pulmonary vascularity within normal limits. No adenopathy. No bone lesions.  IMPRESSION: Fairly small pleural effusion on the right, stable. No edema or consolidation. No change in cardiac silhouette.   Electronically Signed   By: Lowella Grip III M.D.   On: 10/02/2014 19:48     ASSESSMENT AND PLAN:  1.malena with possible upper gi bleed;hb low at  7.5 but stable,GI consult,continue PPI,GI consult,avoid NSAIDs,ASA. 2.chest pain;pt functional study was normal and seen by cardiology  is seen her,no indication for  any work up due to anemia/gi bleed 3.DMII;continue Amaryl 4.htn;controlled    All the records are reviewed and case discussed with Care Management/Social Workerr. Management plans discussed with the patient, family and they are in agreement.  CODE STATUS: full  TOTAL TIME TAKING CARE OF THIS PATIENT:35 minutes.   POSSIBLE D/C IN 1-2 DAYS, DEPENDING ON CLINICAL CONDITION.   Epifanio Lesches M.D on 10/03/2014 at 4:03 PM  Between 7am to 6pm - Pager - [phone]  After 6pm go to [email or web address] - password [email or web address] [facility]  Office  [phone]  CC: Primary care physician; Morton Peters, MD

## 2014-10-03 NOTE — Care Management Note (Signed)
Case Management Note  Patient Details  Name: CHARLIEE KRENZ MRN: [id number] Date of Birth: 04-14-1946  Subjective/Objective:       D/C on 09/27/14 after tx for pneumonia. Admitted 10/02/14 per left side chest pain. Married, resides with husband. PCP is Dr Iva Boop. Pharmacy=[facility]. States that she has a cane and use of a front wheel rolling walker at home but does not use the walker. Voice faint, eyes closed during most of our conversation. Is an active client of [facility] PT and RN.             Action/Plan:   Expected Discharge Date:                  Expected Discharge Plan:     In-House Referral:     Discharge planning Services     Post Acute Care Choice:    Choice offered to:     DME Arranged:    DME Agency:     HH Arranged:    [place] Agency:     Status of Service:     Medicare Important Message Given:    Date Medicare IM Given:    Medicare IM give by:    Date Additional Medicare IM Given:    Additional Medicare Important Message give by:     If discussed at [place] of Stay Meetings, dates discussed:    Additional Comments:  Taras Rask A, RN 10/03/2014, 10:54 AM

## 2014-10-03 NOTE — Progress Notes (Signed)
Patient still having fever and md made aware and order for pt's to be place on droplet isolation and labs work done and started on iv anti biotic, care ongoing

## 2014-10-03 NOTE — Progress Notes (Signed)
Initial Nutrition Assessment  DOCUMENTATION CODES:     INTERVENTION:   (Medical Nutrition Supplement: Glucerna BID for additional nutrition)  Meals and Snacks: Cater to Patient Preferences  NUTRITION DIAGNOSIS:  Inadequate oral intake related to  (Poor PO) as evidenced by percent weight loss of 16% x past 7 months   GOAL:  Patient will meet greater than or equal to 90% of their needs  MONITOR:   (Energy Intake, Electrolyte and Renal Profile)  REASON FOR ASSESSMENT:  Malnutrition Screening Tool    ASSESSMENT:  Reason For Admission: admitted with melena PMHx:  Past Medical History  Diagnosis Date  . Hypertension   . Diabetes mellitus without complication   . Anemia   . Coronary artery disease   . CHF (congestive heart failure)   . Glaucoma   . Hyperlipidemia   . Chronic kidney disease   . Neuropathy     Typical Fluid/ Food Intake: No intake recorded per I/O Meal/ Snack Patterns: Patient reports eating a regular diet with no restrictions prior to admission. Reports cooking her own meals at home and eating a bagel or eggs with grits in the AM, crackers or other "snack " food at lunch, and a meat & 2 veg dinner meal. Also reports drinking 2-3 Ensure supplements per day at home.  Supplements: Ensure 2-3 per day  Labs:  Electrolyte and Renal Profile:  Recent Labs Lab 10/02/14 1900 10/03/14 0418  BUN 27* 24*  CREATININE 1.73* 1.43*  NA 138 138  K 4.5 4.4   Glucose Profile:   Recent Labs  10/03/14 0739 10/03/14 1056  GLUCAP 93 91    Meds: Colace, Zocor  Physical Findings: Nutrition-Focused physical exam completed. Findings are WDL for fat depletion, muscle depletion, and edema.   Weight Changes: Patient and MD note reports chronic weight loss. Current weight represents a 16% weight loss x 7 months, which is significant.   Height:  Ht Readings from Last 1 Encounters:  10/02/14 5\' 7"  (1.702 m)    Weight:  Wt Readings from Last 1 Encounters:   10/03/14 143 lb 3.2 oz (64.955 kg)    Ideal Body Weight:     Wt Readings from Last 10 Encounters:  10/03/14 143 lb 3.2 oz (64.955 kg)  09/27/14 157 lb (71.215 kg)  09/24/14 153 lb (69.4 kg)  09/20/14 147 lb 8 oz (66.906 kg)  02/01/14 171 lb (77.565 kg)  07/18/10 218 lb 6.1 oz (99.057 kg)  06/20/10 221 lb 6.1 oz (100.418 kg)  06/13/10 222 lb 8 oz (100.925 kg)  05/30/10 220 lb (99.791 kg)  04/15/10 221 lb 2.1 oz (100.304 kg)    BMI:  Body mass index is 22.42 kg/(m^2).  Estimated Nutritional Needs:  Kcal:  1754-2073 kcal/ day (BEE: 1227 kcals x 1.1-1.3 IF x 1.3 AF)  Protein:  67-87 kcal/ day (1.0-1.3 g/ kg/ day)  Fluid:  1675-2010 ml/ day (25-30 ml/ kg)  Skin:  Reviewed, no issues  Diet Order:  Diet heart healthy/carb modified Room service appropriate?: Yes; Fluid consistency:: Thin  EDUCATION NEEDS:  No education needs identified at this time   Intake/Output Summary (Last 24 hours) at 10/03/14 1322 Last data filed at 10/03/14 1316  Gross per 24 hour  Intake      0 ml  Output    800 ml  Net   -800 ml    Last BM:  5/31  Roda Shutters, RDN Pager: [phone] Office: [place]

## 2014-10-03 NOTE — Progress Notes (Signed)
DR Vianne Bulls was informed of pt's fever on 101.2 , tylenol given as order will continue to monitor

## 2014-10-03 NOTE — ED Notes (Signed)
Assisted patient up to bathroom and back into bed. NAD noted at this time.

## 2014-10-04 LAB — GLUCOSE, CAPILLARY
GLUCOSE-CAPILLARY: 95 mg/dL (ref 65–99)
Glucose-Capillary: 83 mg/dL (ref 65–99)
Glucose-Capillary: 187 mg/dL — ABNORMAL HIGH (ref 65–99)
Glucose-Capillary: 175 mg/dL — ABNORMAL HIGH (ref 65–99)

## 2014-10-04 NOTE — Progress Notes (Signed)
Pt c/o shoulder pain during this shift,tylenol  Given with with good effect, family at bedside at this time,isolation precaution maintained.

## 2014-10-04 NOTE — Care Management (Signed)
Notified [facility] of readmission .  Hgb 7.6, 7.5 and 7.4.  Has not received any transfusions

## 2014-10-04 NOTE — Progress Notes (Signed)
[place] at [place] NAME: [name]    MR#:  [id number]  DATE OF BIRTH:  1945-10-08  SUBJECTIVE: Patient complains of f left upper quadrant abdominal pain. No nausea, no vomiting. Had a small BM yesterday and she says it was dark. No chest pain. Temperatures are coming down. Complains of some sore throat.   CHIEF COMPLAINT:   Chief Complaint  Patient presents with  . Chest Pain  . Shortness of Breath    REVIEW OF SYSTEMS:    Review of Systems  Constitutional: Negative for fever.  Eyes: Negative for blurred vision.  Cardiovascular: Negative for chest pain and palpitations.  Gastrointestinal: Positive for abdominal pain. Negative for heartburn, nausea and vomiting.  Musculoskeletal: Negative for myalgias and neck pain.  Neurological: Negative for dizziness, tingling, tremors and headaches.  Endo/Heme/Allergies: Negative for environmental allergies. Does not bruise/bleed easily.  Psychiatric/Behavioral: Negative for depression.    Nutrition: liquid diet Tolerating Diet: Tolerating PT:      DRUG ALLERGIES:  No Known Allergies  VITALS:  Blood pressure 160/55, pulse 82, temperature 99.5 F (37.5 C), temperature source Oral, resp. rate 18, height 5\' 7"  (1.702 m), weight 65 kg (143 lb 4.8 oz), SpO2 94 %.  PHYSICAL EXAMINATION:   Physical Exam  GENERAL:  69 y.o.-year-old patient lying in the bed with no acute distress.  EYES: Pupils equal, round, reactive to light and accommodation. No scleral icterus. Extraocular muscles intact.  HEENT: Head atraumatic, normocephalic. Oropharynx and nasopharynx clear.  NECK:  Supple, no jugular venous distention. No thyroid enlargement, no tenderness.  LUNGS: Normal breath sounds bilaterally, no wheezing, rales,rhonchi or crepitation. No use of accessory muscles of respiration.  CARDIOVASCULAR: S1, S2 normal. No murmurs, rubs, or gallops.  ABDOMEN: Mild left upper quadrant tenderness  present, no rebound tenderness EXTREMITIES: No pedal edema, cyanosis, or clubbing.  NEUROLOGIC: Cranial nerves II through XII are intact. Muscle strength 5/5 in all extremities. Sensation intact. Gait not checked.  PSYCHIATRIC: The patient is alert and oriented x 3.  SKIN: No obvious rash, lesion, or ulcer.    LABORATORY PANEL:   CBC  Recent Labs Lab 10/03/14 2136  WBC 4.0  HGB 7.6*  HCT 24.1*  PLT 286   ------------------------------------------------------------------------------------------------------------------  Chemistries   Recent Labs Lab 10/03/14 0418  NA 138  K 4.4  CL 109  CO2 25  GLUCOSE 111*  BUN 24*  CREATININE 1.43*  CALCIUM 7.7*   ------------------------------------------------------------------------------------------------------------------  Cardiac Enzymes  Recent Labs Lab 10/03/14 1517  TROPONINI 0.06*   ------------------------------------------------------------------------------------------------------------------  RADIOLOGY:  Dg Chest 2 View  10/02/2014   CLINICAL DATA:  Chest pain and shortness of breath  EXAM: CHEST  2 VIEW  COMPARISON:  Sep 17, 2014  FINDINGS: There is a stable pleural effusion on the right. Lungs elsewhere are clear. Heart is upper normal in size with pulmonary vascularity within normal limits. No adenopathy. No bone lesions.  IMPRESSION: Fairly small pleural effusion on the right, stable. No edema or consolidation. No change in cardiac silhouette.   Electronically Signed   By: Lowella Grip III M.D.   On: 10/02/2014 19:48     ASSESSMENT AND PLAN:  1.malena with possible upper gi bleed;hb low at  7.5 but stable,GI is following, had EGD done on May 23. Patient likely will have outpatient colonoscopy. Continue PPIs as we are doing now. Hemoglobin is stable. No indication for transfusion.,continue PPI,GI consult,avoid NSAIDs,ASA. 2.chest pain;pt functional study was normal and seen by  cardiology is seen her,no  indication for any work up due to anemia/gi bleed 3.DMII;continue Amaryl 4.htn;controlled 5: Abdominal pain. Check abdominal ultrasound, continue PPIs. #6: Fever likely while blood cultures urine cultures are pending. Started empirically on Rocephin. Temperatures are coming down flu test is negative , discontinue isolation..  All the records are reviewed and case discussed with Care Management/Social Workerr. Management plans discussed with the patient, family and they are in agreement.  CODE STATUS: full  TOTAL TIME TAKING CARE OF THIS PATIENT:35 minutes.   POSSIBLE D/C IN 1-2 DAYS, DEPENDING ON CLINICAL CONDITION.   Epifanio Lesches M.D on 10/04/2014 at 9:59 AM  Between 7am to 6pm - Pager - [phone]  After 6pm go to [email or web address] - password [email or web address] [facility]  Office  [phone]  CC: Primary care physician; Morton Peters, MD

## 2014-10-04 NOTE — Progress Notes (Signed)
  Subjective: Pt not any abdominal pain or diarrhea. She denies any rectal bleeding or melena. She has had nausea and vomiting once after taking medications today & says it was the medication that made her vomit.  No hematemesis.  Objective: Vital signs in last 24 hours: Temp:  [98.6 F (37 C)-102.6 F (39.2 C)] 98.6 F (37 C) (06/02 1121) Pulse Rate:  [76-124] 76 (06/02 1121) Resp:  [18-28] 28 (06/02 1121) BP: (111-161)/(48-97) 137/49 mmHg (06/02 1121) SpO2:  [81 %-96 %] 96 % (06/02 1409) Weight:  [65 kg (143 lb 4.8 oz)] 65 kg (143 lb 4.8 oz) (06/02 0528) Last BM Date: 10/02/14 No LMP recorded. Patient is postmenopausal. Body mass index is 22.44 kg/(m^2). General:   Alert,  Well-developed, pleasant and cooperative in NAD, husband at bedside Head:  Normocephalic and atraumatic. Eyes:  Sclera clear, no icterus.   Conjunctiva pink. Mouth:  No deformity or lesions, oropharynx pink & moist. Neck:  Supple; no masses or thyromegaly. Heart:  Regular rate and rhythm; no murmurs, clicks, rubs, or gallops. Abdomen:   Normal bowel sounds.  Soft, nontender and nondistended. No masses, hepatosplenomegaly or hernias noted.  No guarding or rebound tenderness.   Msk:  Symmetrical without gross deformities. Good equal movement & strength bilaterally. Pulses:  Normal pulses noted. Extremities:  Without clubbing or edema.  No cyanosis Neurologic:  Alert and  oriented x3;  grossly normal neurologically. Skin:  Intact without significant lesions or rashes. Cervical Nodes:  No significant cervical adenopathy. Psych:  Alert and cooperative. Normal mood and affect.  Intake/Output from previous day: 06/01 0701 - 06/02 0700 In: 600 [I.V.:600] Out: 1650 [Urine:1650]  Lab Results:  Recent Labs  10/03/14 1008 10/03/14 1517 10/03/14 2136  WBC 2.8* 3.7 4.0  HGB 7.5* 7.5* 7.6*  HCT 24.2* 24.0* 24.1*  PLT 295 304 286   BMET  Recent Labs  10/02/14 1900 10/03/14 0418  NA 138 138  K 4.5 4.4  CL  108 109  CO2 22 25  GLUCOSE 96 111*  BUN 27* 24*  CREATININE 1.73* 1.43*  CALCIUM 7.8* 7.7*   Studies/Results: Dg Chest 2 View  10/02/2014   CLINICAL DATA:  Chest pain and shortness of breath  EXAM: CHEST  2 VIEW  COMPARISON:  Sep 17, 2014  FINDINGS: There is a stable pleural effusion on the right. Lungs elsewhere are clear. Heart is upper normal in size with pulmonary vascularity within normal limits. No adenopathy. No bone lesions.  IMPRESSION: Fairly small pleural effusion on the right, stable. No edema or consolidation. No change in cardiac silhouette.   Electronically Signed   By: Lowella Grip III M.D.   On: 10/02/2014 19:48    Assessment: Melena:  No further melena while admitted.  Recent EGD by Dr. Vira Agar which was normal for unintentional weight loss. There was no evidence of peptic ulcer disease or GI bleed. Chronic Anemia:  Hemoglobin has been stable, will follow up tomorrow   Plan: #1 continue PPI #2 hemoglobin and hematocrit in the morning #3 watch for gross GI bleeding #4 she has outpatient colonoscopy scheduled with Dr. Vira Agar    LOS: 2 days  Vickey Huger  10/04/2014, 2:43 PM [facility]  [place], [place] [address] Phone: [phone] Fax : [phone]

## 2014-10-04 NOTE — Clinical Documentation Improvement (Signed)
Per Progress Note June 1: "Admittd for chest pain/black stool/acute on chronic anemia" Lab has reported hemoglobin values of 7.4, 7.6 and 7.5.  Can this patient's acute anemia with black stool possibly be further specified, for example:  Acute blood loss anemia Acute anemia, no further specificity Unable to suspect or determine Other, please specify:   Thank You, Carrolyn Meiers, RN [email or web address] [phone]

## 2014-10-05 ENCOUNTER — Inpatient Hospital Stay: Payer: Medicare HMO

## 2014-10-05 LAB — CBC WITH DIFFERENTIAL/PLATELET
BASOS ABS: 0 10*3/uL (ref 0–0.1)
BASOS PCT: 0 %
EOS ABS: 0.1 10*3/uL (ref 0–0.7)
EOS PCT: 1 %
HCT: 24.8 % — ABNORMAL LOW (ref 35.0–47.0)
HEMOGLOBIN: 7.8 g/dL — AB (ref 12.0–16.0)
Lymphocytes Relative: 12 %
Lymphs Abs: 0.6 10*3/uL — ABNORMAL LOW (ref 1.0–3.6)
MCH: 24.5 pg — ABNORMAL LOW (ref 26.0–34.0)
MCHC: 31.6 g/dL — AB (ref 32.0–36.0)
MCV: 77.5 fL — ABNORMAL LOW (ref 80.0–100.0)
Monocytes Absolute: 0.3 10*3/uL (ref 0.2–0.9)
Monocytes Relative: 6 %
NEUTROS ABS: 4.5 10*3/uL (ref 1.4–6.5)
Neutrophils Relative %: 81 %
Platelets: 256 10*3/uL (ref 150–440)
RBC: 3.2 MIL/uL — ABNORMAL LOW (ref 3.80–5.20)
RDW: 22.8 % — ABNORMAL HIGH (ref 11.5–14.5)
WBC: 5.6 10*3/uL (ref 3.6–11.0)

## 2014-10-05 LAB — GLUCOSE, CAPILLARY
GLUCOSE-CAPILLARY: 121 mg/dL — AB (ref 65–99)
Glucose-Capillary: 106 mg/dL — ABNORMAL HIGH (ref 65–99)
Glucose-Capillary: 68 mg/dL (ref 65–99)
Glucose-Capillary: 178 mg/dL — ABNORMAL HIGH (ref 65–99)

## 2014-10-05 MED ORDER — SODIUM CHLORIDE 0.9 % IJ SOLN
3.0000 mL | INTRAMUSCULAR | Status: DC | PRN
  Administered 2014-10-06: 3 mL via INTRAVENOUS
  Filled 2014-10-05: qty 10

## 2014-10-05 MED ORDER — FUROSEMIDE 10 MG/ML IJ SOLN
20.0000 mg | Freq: Two times a day (BID) | INTRAMUSCULAR | Status: DC
  Administered 2014-10-05 – 2014-10-06 (×3): 20 mg via INTRAVENOUS
  Filled 2014-10-05 (×3): qty 2

## 2014-10-05 NOTE — Progress Notes (Signed)
[name] NAME: [name]    MR#:  [id number]  DATE OF BIRTH:  1945/09/07  SUBJECTIVE: denies any complaints. How ever has hypoxia,needing 02.o2 sats 87% to 93%on RA.needing 2litres.chest xray concerning for atelectasis and pulmonary edema.  CHIEF COMPLAINT:   Chief Complaint  Patient presents with  . Chest Pain  . Shortness of Breath    REVIEW OF SYSTEMS:    Review of Systems  Constitutional: Negative for fever and chills.  HENT: Negative for hearing loss.   Eyes: Negative for blurred vision, double vision and photophobia.  Respiratory: Negative for cough, hemoptysis and shortness of breath.   Cardiovascular: Negative for palpitations, orthopnea and leg swelling.  Gastrointestinal: Negative for vomiting, abdominal pain and diarrhea.  Genitourinary: Negative for dysuria and urgency.  Musculoskeletal: Negative for myalgias and neck pain.  Skin: Negative for rash.  Neurological: Negative for dizziness, focal weakness, seizures, weakness and headaches.  Psychiatric/Behavioral: Negative for memory loss. The patient does not have insomnia.     Nutrition: liquid diet Tolerating Diet: Tolerating PT:      DRUG ALLERGIES:  No Known Allergies  VITALS:  Blood pressure 103/53, pulse 68, temperature 99 F (37.2 C), temperature source Oral, resp. rate 18, height 5\' 7"  (1.702 m), weight 64.728 kg (142 lb 11.2 oz), SpO2 97 %.  PHYSICAL EXAMINATION:   Physical Exam  GENERAL:  69 y.o.-year-old patient lying in the bed with no acute distress.  EYES: Pupils equal, round, reactive to light and accommodation. No scleral icterus. Extraocular muscles intact.  HEENT: Head atraumatic, normocephalic. Oropharynx and nasopharynx clear.  NECK:  Supple, no jugular venous distention. No thyroid enlargement, no tenderness.  LUNGS: Normal breath sounds bilaterally, no wheezing, rales,rhonchi or crepitation. No use of accessory  muscles of respiration.  CARDIOVASCULAR: S1, S2 normal. No murmurs, rubs, or gallops.  ABDOMEN: Mild left upper quadrant tenderness present, no rebound tenderness EXTREMITIES: No pedal edema, cyanosis, or clubbing.  NEUROLOGIC: Cranial nerves II through XII are intact. Muscle strength 5/5 in all extremities. Sensation intact. Gait not checked.  PSYCHIATRIC: The patient is alert and oriented x 3.  SKIN: No obvious rash, lesion, or ulcer.    LABORATORY PANEL:   CBC  Recent Labs Lab 10/05/14 0355  WBC 5.6  HGB 7.8*  HCT 24.8*  PLT 256   ------------------------------------------------------------------------------------------------------------------  Chemistries   Recent Labs Lab 10/03/14 0418  NA 138  K 4.4  CL 109  CO2 25  GLUCOSE 111*  BUN 24*  CREATININE 1.43*  CALCIUM 7.7*   ------------------------------------------------------------------------------------------------------------------  Cardiac Enzymes  Recent Labs Lab 10/03/14 1517  TROPONINI 0.06*   ------------------------------------------------------------------------------------------------------------------  RADIOLOGY:  Dg Chest 2 View  10/05/2014   CLINICAL DATA:  Hypoxia, recent hospitalization for chest pain, history hypertension, diabetes mellitus, coronary artery disease post angioplasty, hyperlipidemia, CHF, chronic kidney disease, former smoker  EXAM: CHEST  2 VIEW  COMPARISON:  10/02/2014  FINDINGS: Enlargement of cardiac silhouette with pulmonary vascular congestion.  Increased LEFT pleural effusion and basilar atelectasis.  Skin folds project over LEFT lung.  Tiny RIGHT pleural effusion with fluid at the major fissures bilaterally.  Slight chronic accentuation of interstitial markings without gross acute pulmonary edema or segmental consolidation.  No pneumothorax.  Bones demineralized.  IMPRESSION: Enlargement of cardiac silhouette with pulmonary vascular congestion.  Bibasilar effusions and  atelectasis greater on LEFT.   Electronically Signed   By: Lavonia Dana M.D.   On: 10/05/2014 10:19  ASSESSMENT AND PLAN:  1.malena with possible upper gi bleed;hb low at  7.5 but stable,GI is following, had EGD done on May 23. Patient likely will have outpatient colonoscopy. Continue PPIs as we are doing now. Hemoglobin is stable. No indication for transfusion.,continue PPI,GI consult,avoid NSAIDs,ASA. 2.chest pain;pt functional study was normal and seen by cardiology is seen her,no indication for any work up due to anemia/gi bleed 3.DMII;continue Amaryl 4.htn;controlled 5: Abdominal pain. Resolved with PPI#6: Fever likely while blood cultures urine cultures are pending. Started empirically on Rocephin. T 6.hypoxia due to possible  CHF;;continue IV lasix,continue o2,possible d/c if stable All the records are reviewed and case discussed with Care Management/Social Workerr. Management plans discussed with the patient, family and they are in agreement.  CODE STATUS: full  TOTAL TIME TAKING CARE OF THIS PATIENT:35 minutes.   POSSIBLE D/C IN 1-2 DAYS, DEPENDING ON CLINICAL CONDITION.   Epifanio Lesches M.D on 10/05/2014 at 12:55 PM  Between 7am to 6pm - Pager - [phone]  After 6pm go to [email or web address] - password [email or web address] [facility]  Office  [phone]  CC: Primary care physician; Morton Peters, MD

## 2014-10-05 NOTE — Progress Notes (Signed)
Dr.Hower held pt isosorbide and labetalol because pt BP was 108/41. Will continue to monitor.

## 2014-10-05 NOTE — Progress Notes (Addendum)
Subjective: Denies abdominal pain, nausea, vomiting or diarrhea. She denies any rectal bleeding or melena.   Objective: Vital signs in last 24 hours: Temp:  [98.8 F (37.1 C)-100.7 F (38.2 C)] 100.4 F (38 C) (06/03 1702) Pulse Rate:  [68-92] 84 (06/03 1702) Resp:  [18] 18 (06/03 1154) BP: (103-148)/(41-70) 133/45 mmHg (06/03 1702) SpO2:  [93 %-97 %] 97 % (06/03 1154) Weight:  [64.728 kg (142 lb 11.2 oz)] 64.728 kg (142 lb 11.2 oz) (06/03 0500) Last BM Date: 10/04/14 No LMP recorded. Patient is postmenopausal. Body mass index is 22.34 kg/(m^2). General:   Alert,  Well-developed, pleasant and cooperative in NAD, husband at bedside Head:  Normocephalic and atraumatic. Eyes:  Sclera clear, no icterus.   Conjunctiva pink. Mouth:  No deformity or lesions, oropharynx pink & moist. Neck:  Supple; no masses or thyromegaly. Heart:  Regular rate and rhythm; no murmurs, clicks, rubs, or gallops. Abdomen:   Normal bowel sounds.  Soft, nontender and nondistended. No masses, hepatosplenomegaly or hernias noted.  No guarding or rebound tenderness.   Msk:  Symmetrical without gross deformities. Good equal movement & strength bilaterally. Pulses:  Normal pulses noted. Extremities:  Without clubbing or edema.  No cyanosis Neurologic:  Alert and  oriented x3;  grossly normal neurologically. Skin:  Intact without significant lesions or rashes. Cervical Nodes:  No significant cervical adenopathy. Psych:  Alert and cooperative. Normal mood and affect.  Intake/Output from previous day: 06/02 0701 - 06/03 0700 In: 783.3 [P.O.:240; I.V.:543.3] Out: 500 [Urine:500]  Lab Results:  Recent Labs  10/03/14 1517 10/03/14 2136 10/05/14 0355  WBC 3.7 4.0 5.6  HGB 7.5* 7.6* 7.8*  HCT 24.0* 24.1* 24.8*  PLT 304 286 256   BMET  Recent Labs  10/02/14 1900 10/03/14 0418  NA 138 138  K 4.5 4.4  CL 108 109  CO2 22 25  GLUCOSE 96 111*  BUN 27* 24*  CREATININE 1.73* 1.43*  CALCIUM 7.8* 7.7*    Studies/Results: Dg Chest 2 View  10/05/2014   CLINICAL DATA:  Hypoxia, recent hospitalization for chest pain, history hypertension, diabetes mellitus, coronary artery disease post angioplasty, hyperlipidemia, CHF, chronic kidney disease, former smoker  EXAM: CHEST  2 VIEW  COMPARISON:  10/02/2014  FINDINGS: Enlargement of cardiac silhouette with pulmonary vascular congestion.  Increased LEFT pleural effusion and basilar atelectasis.  Skin folds project over LEFT lung.  Tiny RIGHT pleural effusion with fluid at the major fissures bilaterally.  Slight chronic accentuation of interstitial markings without gross acute pulmonary edema or segmental consolidation.  No pneumothorax.  Bones demineralized.  IMPRESSION: Enlargement of cardiac silhouette with pulmonary vascular congestion.  Bibasilar effusions and atelectasis greater on LEFT.   Electronically Signed   By: Lavonia Dana M.D.   On: 10/05/2014 10:19    Assessment: Melena:  No further melena while admitted.  Recent EGD by Dr. Vira Agar which was normal for unintentional weight loss. There was no evidence of peptic ulcer disease or GI bleed. Chronic Anemia:  Hemoglobin has been stable   Plan: #1 continue PPI #2 watch for gross GI bleeding #3 outpatient colonoscopy should be scheduled with Dr. Vira Agar   Signing off.  Call if any further problems or questions.  We would like to thank you for the opportunity to participate in the care of [name]. [facility] will be for [place] covering over the weekend if needed   LOS: 3 days  Vickey Huger  10/05/2014, 5:11 PM [facility]  [address]  [place],  [address] Phone: [phone] Fax : [phone]

## 2014-10-06 ENCOUNTER — Inpatient Hospital Stay: Payer: Medicare HMO

## 2014-10-06 LAB — CBC WITH DIFFERENTIAL/PLATELET
BASOS PCT: 1 %
Basophils Absolute: 0 10*3/uL (ref 0–0.1)
Eosinophils Absolute: 0.2 10*3/uL (ref 0–0.7)
Eosinophils Relative: 3 %
HEMATOCRIT: 23.4 % — AB (ref 35.0–47.0)
HEMOGLOBIN: 7.6 g/dL — AB (ref 12.0–16.0)
Lymphocytes Relative: 12 %
Lymphs Abs: 0.7 10*3/uL — ABNORMAL LOW (ref 1.0–3.6)
MCH: 24.9 pg — AB (ref 26.0–34.0)
MCHC: 32.4 g/dL (ref 32.0–36.0)
MCV: 77 fL — AB (ref 80.0–100.0)
MONOS PCT: 8 %
Monocytes Absolute: 0.5 10*3/uL (ref 0.2–0.9)
Neutro Abs: 4.6 10*3/uL (ref 1.4–6.5)
Neutrophils Relative %: 76 %
Platelets: 276 10*3/uL (ref 150–440)
RBC: 3.04 MIL/uL — AB (ref 3.80–5.20)
RDW: 22.2 % — ABNORMAL HIGH (ref 11.5–14.5)
WBC: 6.1 10*3/uL (ref 3.6–11.0)

## 2014-10-06 LAB — BLOOD GAS, ARTERIAL
Acid-base deficit: 1.5 mmol/L (ref 0.0–2.0)
Allens test (pass/fail): POSITIVE — AB
Bicarbonate: 22.9 mEq/L (ref 21.0–28.0)
FIO2: 0.28 %
O2 Saturation: 94.7 %
PH ART: 7.4 (ref 7.350–7.450)
PO2 ART: 74 mmHg — AB (ref 83.0–108.0)
Patient temperature: 37
pCO2 arterial: 37 mmHg (ref 32.0–48.0)

## 2014-10-06 LAB — URINE CULTURE: Culture: 30000

## 2014-10-06 LAB — GLUCOSE, CAPILLARY
GLUCOSE-CAPILLARY: 85 mg/dL (ref 65–99)
GLUCOSE-CAPILLARY: 122 mg/dL — AB (ref 65–99)
GLUCOSE-CAPILLARY: 144 mg/dL — AB (ref 65–99)
GLUCOSE-CAPILLARY: 131 mg/dL — AB (ref 65–99)
Glucose-Capillary: 147 mg/dL — ABNORMAL HIGH (ref 65–99)

## 2014-10-06 LAB — COMPREHENSIVE METABOLIC PANEL
ALBUMIN: 2.2 g/dL — AB (ref 3.5–5.0)
ALT: 10 U/L — ABNORMAL LOW (ref 14–54)
ANION GAP: 6 (ref 5–15)
AST: 19 U/L (ref 15–41)
Alkaline Phosphatase: 46 U/L (ref 38–126)
BUN: 42 mg/dL — AB (ref 6–20)
CALCIUM: 7.6 mg/dL — AB (ref 8.9–10.3)
CO2: 24 mmol/L (ref 22–32)
Chloride: 99 mmol/L — ABNORMAL LOW (ref 101–111)
Creatinine, Ser: 2.24 mg/dL — ABNORMAL HIGH (ref 0.44–1.00)
GFR calc non Af Amer: 21 mL/min — ABNORMAL LOW (ref >60)
GFR, EST AFRICAN AMERICAN: 25 mL/min — AB (ref >60)
GLUCOSE: 139 mg/dL — AB (ref 65–99)
POTASSIUM: 5.9 mmol/L — AB (ref 3.5–5.1)
SODIUM: 129 mmol/L — AB (ref 135–145)
TOTAL PROTEIN: 6.4 g/dL — AB (ref 6.5–8.1)
Total Bilirubin: 0.4 mg/dL (ref 0.3–1.2)

## 2014-10-06 LAB — PREPARE RBC (CROSSMATCH)

## 2014-10-06 LAB — CREATININE, SERUM
CREATININE: 2.1 mg/dL — AB (ref 0.44–1.00)
GFR calc Af Amer: 27 mL/min — ABNORMAL LOW (ref >60)
GFR, EST NON AFRICAN AMERICAN: 23 mL/min — AB (ref >60)

## 2014-10-06 MED ORDER — VANCOMYCIN HCL IN DEXTROSE 1-5 GM/200ML-% IV SOLN: 1000.0000 mg | INTRAVENOUS | Status: DC

## 2014-10-06 MED ORDER — PIPERACILLIN-TAZOBACTAM 3.375 G IVPB
3.3750 g | Freq: Three times a day (TID) | INTRAVENOUS | Status: DC
  Administered 2014-10-06 – 2014-10-08 (×6): 3.375 g via INTRAVENOUS
  Filled 2014-10-06 (×10): qty 50

## 2014-10-06 MED ORDER — VANCOMYCIN HCL IN DEXTROSE 1-5 GM/200ML-% IV SOLN
1000.0000 mg | Freq: Once | INTRAVENOUS | Status: AC
  Administered 2014-10-06: 1000 mg via INTRAVENOUS
  Filled 2014-10-06: qty 200

## 2014-10-06 MED ORDER — VANCOMYCIN HCL IN DEXTROSE 1-5 GM/200ML-% IV SOLN
1000.0000 mg | INTRAVENOUS | Status: DC
  Administered 2014-10-07: 1000 mg via INTRAVENOUS
  Filled 2014-10-06 (×2): qty 200

## 2014-10-06 MED ORDER — PIPERACILLIN-TAZOBACTAM 3.375 G IVPB 30 MIN: 3.3750 g | Freq: Three times a day (TID) | INTRAVENOUS | Status: DC

## 2014-10-06 MED ORDER — SODIUM CHLORIDE 0.9 % IV SOLN
Freq: Once | INTRAVENOUS | Status: AC
  Administered 2014-10-06: 23:00:00 via INTRAVENOUS

## 2014-10-06 MED ORDER — AMOXICILLIN-POT CLAVULANATE 875-125 MG PO TABS: 1.0000 | ORAL_TABLET | Freq: Two times a day (BID) | ORAL | Status: DC

## 2014-10-06 NOTE — Care Management Note (Signed)
Case Management Note  Patient Details  Name: [name] MRN: [id number] Date of Birth: 1945/11/07  Subjective/Objective:              Received a verbal order from Dr Vianne Bulls to resume home health PT and RN with [facility]. Request was faxed and called to Chastity at [facility].       Action/Plan:   Expected Discharge Date:                  Expected Discharge Plan:     In-House Referral:     Discharge planning Services     Post Acute Care Choice:    Choice offered to:     DME Arranged:    DME Agency:     HH Arranged:    [place] Agency:     Status of Service:     Medicare Important Message Given:  Yes Date Medicare IM Given:  10/05/14 Medicare IM give by:  Joni Reining Date Additional Medicare IM Given:    Additional Medicare Important Message give by:     If discussed at [place] of Stay Meetings, dates discussed:    Additional Comments:  [name] A, RN 10/06/2014, 10:57 AM

## 2014-10-06 NOTE — Progress Notes (Signed)
Chaplain met with family and patient, prayer offered along with encouragement. [name] Saturday 10-06-2014 [phone]   10/06/14 2000  Clinical Encounter Type  Visited With Patient and family together  Visit Type Initial;Spiritual support  Referral From Family  Consult/Referral To Chaplain  Spiritual Encounters  Spiritual Needs Prayer;Emotional  Stress Factors  Patient Stress Factors Health changes  Family Stress Factors None identified

## 2014-10-06 NOTE — Progress Notes (Signed)
Dr Benjie Karvonen notified of pt being more lethargic.  Family concerned and requesting MD to come to see pt.  MD acknowledged and will come to assess pt.  Vitals taken and stable.  Blood sugar stable.   Lynnda Shields, RN

## 2014-10-06 NOTE — Progress Notes (Signed)
Pt lethargic. Blood drawn and abdominal scan in process. Md notified of lab results. Acknowledge and orders to be put in.

## 2014-10-06 NOTE — Progress Notes (Signed)
Resume home health orders at [facility] for RN and PT.  V.O.: Dr Quincy Simmonds, RN, BSN, CM

## 2014-10-06 NOTE — Discharge Summary (Addendum)
[name], is a 69 y.o. female  DOB 12-26-1945  MRN [id number].  Admission date:  10/02/2014  Admitting Physician  Juluis Mire, MD  Discharge Date:  10/06/2014   Primary MD  Morton Peters, MD  Recommendations for primary care physician for things to follow:   Up with cardiology in one week Follow up  GI in 1 week I in 1 week   Admission Diagnosis  Upper GI bleeding [K92.2] Chest pain of uncertain etiology [L57.26]   Discharge Diagnosis  Upper GI bleeding [K92.2] Chest pain of uncertain etiology [O03.55]    Principal Problem:   Melena Active Problems:   DM (diabetes mellitus)   Essential hypertension   Coronary atherosclerosis   HTN (hypertension), malignant   Chest pain      Past Medical History  Diagnosis Date  . Hypertension   . Diabetes mellitus without complication   . Anemia   . Coronary artery disease   . CHF (congestive heart failure)   . Glaucoma   . Hyperlipidemia   . Chronic kidney disease   . Neuropathy   . Chronic anemia     Past Surgical History  Procedure Laterality Date  . Cardiac catheterization    . Esophagogastroduodenoscopy (egd) with propofol N/A 09/24/2014    Elliott-gastritis & normal Billroth II changes   . Colonoscopy with propofol N/A 09/24/2014    Elliott-Incomplete secondary to prep  . Coronary angioplasty  2016    stent placed  . Bilroth ii procedure         History of present illness and  Hospital Course:     Kindly see H&P for history of present illness and admission details, please review complete Labs, Consult reports and Test reports for all details in brief  HPI  from the history and physical done on the day of admission  69 year old female patient with hypertension, diabetes comes in because of right-sided chest pai. She is admitted to  telemetry for chest pain evaluation.    Hospital Course  #1 chest pain likely atypical patient seen by cardiology, patient had outpatient functional study in April which showed normal LV function with no evidence of ischemia. Patient supposed to follow-up with cardiology as an outpatient. Patient's troponins have been negative and she was ruled out for MI chest pain resolved and patient continued on amlodipine simvastatin ramipril imdur. Did not have any further cardiology workup because of recent fungal negative functional study, follow-up with cardiology as an outpatient #2-Melena,anemia; and by the GI, patient had EGD done on May 23 showed only of pill gastritis and that she is scheduled to have colonoscopy. Patient started on IV Protonix drip . We stopped the aspirin, Mobic. Patient did not require any transfusions. Hemoglobin stayed stable . Did not have further melena. hematemesis.  at 7.8.  3. Hypertension BP stable.  4. type 2 diabetes mellitus continue home medications 5.history of CHF patient did have hypoxia we will give some Lasix and her oxygenation is better today. 6/Fever patient blood cultures negative flu test is negative and medical he given Rocephin, discharging her home with Augmentin. Patient has no cough chest x-ray is negative for pneumonia. 7. Valere Dross loss possibly developing early dementia. Discharge condition: Stable  Follow UP  Follow-up Information    Follow up with Morton Peters, MD In 1 week.   Specialty:  Family Medicine   Contact information:   [place] [address] [phone]  Follow up with Teodoro Spray., MD In 1 week.   Specialty:  Cardiology   Contact information:   [place] [address] [phone]       Follow up with Ollen Bowl, MD In 1 week.   Specialty:  Gastroenterology   Contact information:   [address] [place] [address] [phone]          Discharge Instructions  and  Discharge Medications       Medication List    STOP taking these medications        aspirin 81 MG tablet     azithromycin 250 MG tablet  Commonly known as:  ZITHROMAX     cefUROXime 500 MG tablet  Commonly known as:  CEFTIN     meloxicam 7.5 MG tablet  Commonly known as:  MOBIC     oxyCODONE-acetaminophen 5-325 MG per tablet  Commonly known as:  PERCOCET/ROXICET      TAKE these medications        amLODipine 10 MG tablet  Commonly known as:  NORVASC  Take 10 mg by mouth daily.     amoxicillin-clavulanate 875-125 MG per tablet  Commonly known as:  AUGMENTIN  Take 1 tablet by mouth 2 (two) times daily.     docusate sodium 100 MG capsule  Commonly known as:  COLACE  Take 100 mg by mouth 2 (two) times daily.     feeding supplement (GLUCERNA SHAKE) Liqd  Take 237 mLs by mouth 2 (two) times daily between meals.     ferrous sulfate 325 (65 FE) MG tablet  Take 325 mg by mouth daily.     FISH OIL PO  Take 1 capsule by mouth daily.     furosemide 40 MG tablet  Commonly known as:  LASIX  Take 1 tablet (40 mg total) by mouth 2 (two) times daily.     glimepiride 2 MG tablet  Commonly known as:  AMARYL  Take 1 mg by mouth daily with breakfast. Take 1/2 tablet daily     hydrALAZINE 100 MG tablet  Commonly known as:  APRESOLINE  Take 100 mg by mouth 3 (three) times daily.     isosorbide dinitrate 30 MG tablet  Commonly known as:  ISORDIL  Take 30 mg by mouth 2 (two) times daily.     labetalol 200 MG tablet  Commonly known as:  NORMODYNE  Take 1 tablet (200 mg total) by mouth 2 (two) times daily.     pantoprazole 40 MG tablet  Commonly known as:  PROTONIX  Take 40 mg by mouth daily.     potassium chloride SA 20 MEQ tablet  Commonly known as:  K-DUR,KLOR-CON  Take 1 tablet (20 mEq total) by mouth daily.     ramipril 5 MG capsule  Commonly known as:  ALTACE  Take 5 mg by mouth daily.     simvastatin 20 MG tablet   Commonly known as:  ZOCOR  Take 20 mg by mouth every evening.          Diet and Activity recommendation: See Discharge Instructions above   Consults obtained - cardiology, GI   Major procedures and Radiology Reports - PLEASE review detailed and final reports for all details, in brief -      Dg Chest 2 View  10/05/2014   CLINICAL DATA:  Hypoxia, recent hospitalization for chest pain, history hypertension, diabetes mellitus, coronary artery disease post angioplasty, hyperlipidemia, CHF,  chronic kidney disease, former smoker  EXAM: CHEST  2 VIEW  COMPARISON:  10/02/2014  FINDINGS: Enlargement of cardiac silhouette with pulmonary vascular congestion.  Increased LEFT pleural effusion and basilar atelectasis.  Skin folds project over LEFT lung.  Tiny RIGHT pleural effusion with fluid at the major fissures bilaterally.  Slight chronic accentuation of interstitial markings without gross acute pulmonary edema or segmental consolidation.  No pneumothorax.  Bones demineralized.  IMPRESSION: Enlargement of cardiac silhouette with pulmonary vascular congestion.  Bibasilar effusions and atelectasis greater on LEFT.   Electronically Signed   By: Lavonia Dana M.D.   On: 10/05/2014 10:19   Dg Chest 2 View  10/02/2014   CLINICAL DATA:  Chest pain and shortness of breath  EXAM: CHEST  2 VIEW  COMPARISON:  Sep 17, 2014  FINDINGS: There is a stable pleural effusion on the right. Lungs elsewhere are clear. Heart is upper normal in size with pulmonary vascularity within normal limits. No adenopathy. No bone lesions.  IMPRESSION: Fairly small pleural effusion on the right, stable. No edema or consolidation. No change in cardiac silhouette.   Electronically Signed   By: Lowella Grip III M.D.   On: 10/02/2014 19:48   Dg Chest 2 View  09/17/2014   CLINICAL DATA:  Right side chest pain beginning 09/16/2014. Shortness of breath.  EXAM: CHEST  2 VIEW  COMPARISON:  PA and lateral chest 07/21/2014.  FINDINGS: There  is cardiomegaly. Since the prior study, the patient has developed small to moderate pleural effusions and basilar airspace disease, worse on the right. No pneumothorax is identified. There is no evidence of pulmonary edema. Degenerative change is seen about the shoulders.  IMPRESSION: New small to moderate pleural effusions and basilar airspace disease, worse on the right.  Cardiomegaly without edema.   Electronically Signed   By: Inge Rise M.D.   On: 09/17/2014 10:07   US Renal  09/17/2014   CLINICAL DATA:  Chronic kidney disease, hypertension, diabetes mellitus  EXAM: RENAL / URINARY TRACT ULTRASOUND COMPLETE  COMPARISON:  CT abdomen and pelvis 04/06/2009  FINDINGS: Right Kidney:  Length: 10.1 cm. Normal cortical thickness. Increased cortical echogenicity. No mass, hydronephrosis or shadowing calcification.  Left Kidney:  Length: 12.0 cm. Normal cortical thickness. Increased cortical echogenicity. Echogenic nodule mid LEFT kidney 12 x 11 x 18 mm question angiomyolipoma present on prior CT. No additional renal mass, hydronephrosis or shadowing calcification.  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  Medical renal disease changes of both kidneys.  No additional renal mass or hydronephrosis.   Electronically Signed   By: Lavonia Dana M.D.   On: 09/17/2014 19:32    Micro Results     Recent Results (from the past 240 hour(s))  Urine culture     Status: None (Preliminary result)   Collection Time: 10/03/14  5:48 PM  Result Value Ref Range Status   Specimen Description URINE, RANDOM  Final   Special Requests NONE  Final   Culture   Final    30,000 COLONIES/ml GRAM POSITIVE COCCI IDENTIFICATION AND SUSCEPTIBILITIES TO FOLLOW    Report Status PENDING  Incomplete  Culture, blood (routine x 2)     Status: None (Preliminary result)   Collection Time: 10/03/14  7:03 PM  Result Value Ref Range Status   Specimen Description BLOOD  Final   Special  Requests BLOOD  Final   Culture NO GROWTH 3 DAYS  Final   Report  Status PENDING  Incomplete  Culture, blood (routine x 2)     Status: None (Preliminary result)   Collection Time: 10/03/14  7:03 PM  Result Value Ref Range Status   Specimen Description BLOOD  Final   Special Requests BLOOD  Final   Culture NO GROWTH 3 DAYS  Final   Report Status PENDING  Incomplete       Today   Subjective:   [place] today has no headache,no chest abdominal pain,no new weakness tingling or numbness, feels much better wants to go home today.   Objective:   Blood pressure 134/42, pulse 74, temperature 99.9 F (37.7 C), temperature source Oral, resp. rate 19, height 5\' 7"  (1.702 m), weight 67.722 kg (149 lb 4.8 oz), SpO2 97 %.   Intake/Output Summary (Last 24 hours) at 10/06/14 1028 Last data filed at 10/06/14 0900  Gross per 24 hour  Intake      0 ml  Output      0 ml  Net      0 ml    Exam Awake Alert, Oriented x 3, No new F.N deficits, Normal affect [place].AT,PERRAL Supple Neck,No JVD, No cervical lymphadenopathy appriciated.  Symmetrical Chest wall movement, Good air movement bilaterally, CTAB RRR,No Gallops,Rubs or new Murmurs, No Parasternal Heave +ve B.Sounds, Abd Soft, Non tender, No organomegaly appriciated, No rebound -guarding or rigidity. No Cyanosis, Clubbing or edema, No new Rash or bruise  Data Review   CBC w Diff: Lab Results  Component Value Date   WBC 5.6 10/05/2014   WBC 4.2 05/05/2014   HGB 7.8* 10/05/2014   HGB 10.9* 05/05/2014   HCT 24.8* 10/05/2014   HCT 33.9* 05/05/2014   PLT 256 10/05/2014   PLT 219 05/05/2014   LYMPHOPCT 12 10/05/2014   LYMPHOPCT 20.9 05/05/2014   MONOPCT 6 10/05/2014   MONOPCT 5.6 05/05/2014   EOSPCT 1 10/05/2014   EOSPCT 6.0 05/05/2014   BASOPCT 0 10/05/2014   BASOPCT 0.9 05/05/2014    CMP: Lab Results  Component Value Date   NA 138 10/03/2014   NA 146* 05/05/2014   K 4.4 10/03/2014   K 3.4* 05/05/2014   CL 109  10/03/2014   CL 118* 05/05/2014   CO2 25 10/03/2014   CO2 21 05/05/2014   BUN 24* 10/03/2014   BUN 26* 05/05/2014   CREATININE 1.43* 10/03/2014   CREATININE 2.02* 05/05/2014   PROT 6.7 09/17/2014   PROT 6.9 02/08/2013   ALBUMIN 2.8* 09/17/2014   ALBUMIN 3.6 02/08/2013   BILITOT 0.6 09/17/2014   ALKPHOS 68 09/17/2014   ALKPHOS 119 02/08/2013   AST 29 09/17/2014   AST 23 02/08/2013   ALT 17 09/17/2014   ALT 18 02/08/2013  .   Total Time in preparing paper work, data evaluation and todays exam - 76 minutes  Kerington Hildebrant M.D on 10/06/2014 at 10:28 AM  Hospital discharge secondary to hypoxia requiring oxygen and without oxygen saturations are dropping to 79% plan is to do the chest x-ray, continue IV Lasix. Patient probably needs a VQ scan to evaluate for PE as well.

## 2014-10-06 NOTE — Progress Notes (Addendum)
ANTIBIOTIC CONSULT NOTE - INITIAL  Pharmacy Consult for Vancomycin/Zosyn Indication: pneumonia  No Known Allergies  Patient Measurements: Height: 5\' 7"  (170.2 cm) Weight: 149 lb 4.Krystal oz (67.722 kg) IBW/kg (Calculated) : 61.6   Vital Signs: Temp: 98.9 F (37.2 C) (06/04 1124) Temp Source: Oral (06/04 0434) BP: 102/35 mmHg (06/04 1124) Pulse Rate: 63 (06/04 1124) Intake/Output from previous day: 06/03 0701 - 06/04 0700 In: 240 [P.O.:240] Out: 0  Intake/Output from this shift:    Labs:  Recent Labs  10/03/14 1517 10/03/14 2136 10/05/14 0355  WBC 3.7 4.0 5.6  HGB 7.5* 7.6* 7.Krystal*  PLT 304 286 256   Estimated Creatinine Clearance: 36.1 mL/min (by C-G formula based on Cr of 1.43). No results for input(s): VANCOTROUGH, VANCOPEAK, VANCORANDOM, GENTTROUGH, GENTPEAK, GENTRANDOM, TOBRATROUGH, TOBRAPEAK, TOBRARND, AMIKACINPEAK, AMIKACINTROU, AMIKACIN in the last 72 hours.   Microbiology: Recent Results (from the past 720 hour(s))  Culture, blood (routine x 2)     Status: None   Collection Time: 09/17/14 11:20 AM  Result Value Ref Range Status   Specimen Description BLOOD  Final   Special Requests LEFT ARM  Final   Culture NO GROWTH 5 DAYS  Final   Report Status 09/22/2014 FINAL  Final  Culture, blood (routine x 2)     Status: None   Collection Time: 09/17/14 11:20 AM  Result Value Ref Range Status   Specimen Description BLOOD  Final   Special Requests RIGHT AC  Final   Culture NO GROWTH 5 DAYS  Final   Report Status 09/22/2014 FINAL  Final  Urine culture     Status: None   Collection Time: 10/03/14  5:48 PM  Result Value Ref Range Status   Specimen Description URINE, RANDOM  Final   Special Requests NONE  Final   Culture   Final    30,000 COLONIES/ml ENTEROCOCCUS FAECALIS MULTIPLE SPECIES PRESENT    Report Status 10/06/2014 FINAL  Final   Organism ID, Bacteria ENTEROCOCCUS FAECALIS  Final      Susceptibility   Enterococcus faecalis - MIC*    AMPICILLIN <=2  SENSITIVE Sensitive     CIPROFLOXACIN >=Krystal RESISTANT Resistant     LEVOFLOXACIN >=Krystal RESISTANT Resistant     TETRACYCLINE >=16 RESISTANT Resistant     LINEZOLID 2 SENSITIVE Sensitive     * 30,000 COLONIES/ml ENTEROCOCCUS FAECALIS  Culture, blood (routine x 2)     Status: None (Preliminary result)   Collection Time: 10/03/14  7:03 PM  Result Value Ref Range Status   Specimen Description BLOOD  Final   Special Requests BLOOD  Final   Culture NO GROWTH 3 DAYS  Final   Report Status PENDING  Incomplete  Culture, blood (routine x 2)     Status: None (Preliminary result)   Collection Time: 10/03/14  7:03 PM  Result Value Ref Range Status   Specimen Description BLOOD  Final   Special Requests BLOOD  Final   Culture NO GROWTH 3 DAYS  Final   Report Status PENDING  Incomplete    Medical History: Past Medical History  Diagnosis Date  . Hypertension   . Diabetes mellitus without complication   . Anemia   . Coronary artery disease   . CHF (congestive heart failure)   . Glaucoma   . Hyperlipidemia   . Chronic kidney disease   . Neuropathy   . Chronic anemia     Medications:  Scheduled:  . amLODipine  10 mg Oral Daily  . docusate sodium  100  mg Oral BID  . feeding supplement (GLUCERNA SHAKE)  237 mL Oral BID WC  . ferrous sulfate  325 mg Oral Daily  . furosemide  20 mg Intravenous Q12H  . glimepiride  1 mg Oral Q breakfast  . hydrALAZINE  100 mg Oral TID  . insulin aspart  0-9 Units Subcutaneous TID WC  . isosorbide dinitrate  30 mg Oral BID  . labetalol  200 mg Oral BID  . pantoprazole (PROTONIX) IV  40 mg Intravenous Q12H  . piperacillin-tazobactam (ZOSYN)  IV  3.375 g Intravenous 3 times per day  . potassium chloride SA  20 mEq Oral Daily  . ramipril  5 mg Oral Daily  . simvastatin  20 mg Oral QPM  . vancomycin  1,000 mg Intravenous Q24H  . vancomycin  1,000 mg Intravenous Once   Assessment: 69 yo Marsh with hypoxia, broadening abx coverage for PNA  BCx x2 NGTD  Goal  of Therapy:  Vancomycin trough level 15-20 mcg/ml  Plan:  Zosyn 3.375 g IV q8h EI  Vancomycin 1 g IV x1 dose for now - will order SCr for today, last SCr from 6/1   Rayna Sexton, PharmD, BCPS Clinical Pharmacist 10/06/2014,12:52 PM   SCr 2.10, CrCl ~ 24.6 ml/min Ke 0.025, half life 27.7 h, Vd 47.4 L  Will order vancomycin 1 g IV q36 h, next dose 6/5 at 0430 Will order vanc trough prior to 3rd dose (not at steady state) on 6/Krystal at 0400 Will need to continue to monitor renal function (BMET ordered for tomorrow)  Rayna Sexton, PharmD, BCPS Clinical Pharmacist  10/06/2014 2:15 PM

## 2014-10-06 NOTE — Care Management Note (Signed)
Case Management Note  Patient Details  Name: LOETTA CONNELLEY MRN: [id number] Date of Birth: 22-Aug-1945  Subjective/Objective:            IRIS RN noted that Ms Burkett's O2 sats dropped to 78% at rest without oxygen. She is called Dr Vianne Bulls to advise as to whether she wants to discharge Ms Goodson home today with oxygen.         Action/Plan:   Expected Discharge Date:                  Expected Discharge Plan:     In-House Referral:     Discharge planning Services     Post Acute Care Choice:    Choice offered to:     DME Arranged:    DME Agency:     HH Arranged:    [place] Agency:     Status of Service:     Medicare Important Message Given:  Yes Date Medicare IM Given:  10/05/14 Medicare IM give by:  Joni Reining Date Additional Medicare IM Given:    Additional Medicare Important Message give by:     If discussed at [place] of Stay Meetings, dates discussed:    Additional Comments:  Freyja Govea A, RN 10/06/2014, 11:57 AM

## 2014-10-07 LAB — GLUCOSE, CAPILLARY
GLUCOSE-CAPILLARY: 52 mg/dL — AB (ref 65–99)
GLUCOSE-CAPILLARY: 313 mg/dL — AB (ref 65–99)
Glucose-Capillary: 100 mg/dL — ABNORMAL HIGH (ref 65–99)
Glucose-Capillary: 173 mg/dL — ABNORMAL HIGH (ref 65–99)
Glucose-Capillary: 256 mg/dL — ABNORMAL HIGH (ref 65–99)
Glucose-Capillary: 254 mg/dL — ABNORMAL HIGH (ref 65–99)

## 2014-10-07 LAB — BASIC METABOLIC PANEL
ANION GAP: 5 (ref 5–15)
BUN: 43 mg/dL — AB (ref 6–20)
CALCIUM: 7.3 mg/dL — AB (ref 8.9–10.3)
CO2: 24 mmol/L (ref 22–32)
CREATININE: 2.06 mg/dL — AB (ref 0.44–1.00)
Chloride: 102 mmol/L (ref 101–111)
GFR calc Af Amer: 27 mL/min — ABNORMAL LOW (ref >60)
GFR calc non Af Amer: 23 mL/min — ABNORMAL LOW (ref >60)
GLUCOSE: 49 mg/dL — AB (ref 65–99)
POTASSIUM: 5.6 mmol/L — AB (ref 3.5–5.1)
Sodium: 131 mmol/L — ABNORMAL LOW (ref 135–145)

## 2014-10-07 LAB — CBC
HEMATOCRIT: 23.9 % — AB (ref 35.0–47.0)
Hemoglobin: 7.7 g/dL — ABNORMAL LOW (ref 12.0–16.0)
MCH: 24.8 pg — AB (ref 26.0–34.0)
MCHC: 32.3 g/dL (ref 32.0–36.0)
MCV: 77 fL — AB (ref 80.0–100.0)
Platelets: 238 10*3/uL (ref 150–440)
RBC: 3.11 MIL/uL — ABNORMAL LOW (ref 3.80–5.20)
RDW: 20.5 % — ABNORMAL HIGH (ref 11.5–14.5)
WBC: 5.2 10*3/uL (ref 3.6–11.0)

## 2014-10-07 MED ORDER — ALBUTEROL SULFATE (2.5 MG/3ML) 0.083% IN NEBU: 2.5000 mg | INHALATION_SOLUTION | RESPIRATORY_TRACT | Status: DC | PRN

## 2014-10-07 MED ORDER — FUROSEMIDE 10 MG/ML IJ SOLN
40.0000 mg | Freq: Two times a day (BID) | INTRAMUSCULAR | Status: DC
  Administered 2014-10-07 – 2014-10-09 (×5): 40 mg via INTRAVENOUS
  Filled 2014-10-07 (×6): qty 4

## 2014-10-07 MED ORDER — METHYLPREDNISOLONE SODIUM SUCC 125 MG IJ SOLR
60.0000 mg | INTRAMUSCULAR | Status: DC
  Administered 2014-10-07 – 2014-10-08 (×2): 60 mg via INTRAVENOUS
  Filled 2014-10-07 (×2): qty 2

## 2014-10-07 MED ORDER — INSULIN ASPART 100 UNIT/ML ~~LOC~~ SOLN
0.0000 [IU] | SUBCUTANEOUS | Status: DC
  Administered 2014-10-07: 7 [IU] via SUBCUTANEOUS
  Administered 2014-10-07: 5 [IU] via SUBCUTANEOUS
  Administered 2014-10-08: 3 [IU] via SUBCUTANEOUS
  Administered 2014-10-08: 2 [IU] via SUBCUTANEOUS
  Administered 2014-10-08: 9 [IU] via SUBCUTANEOUS
  Administered 2014-10-08: 2 [IU] via SUBCUTANEOUS
  Administered 2014-10-09: 1 [IU] via SUBCUTANEOUS
  Administered 2014-10-09: 7 [IU] via SUBCUTANEOUS
  Filled 2014-10-07: qty 3
  Filled 2014-10-07: qty 7
  Filled 2014-10-07: qty 5
  Filled 2014-10-07 (×2): qty 2
  Filled 2014-10-07: qty 9
  Filled 2014-10-07: qty 7
  Filled 2014-10-07: qty 1

## 2014-10-07 MED ORDER — SODIUM POLYSTYRENE SULFONATE 15 GM/60ML PO SUSP
30.0000 g | Freq: Once | ORAL | Status: AC
  Administered 2014-10-07: 30 g via ORAL
  Filled 2014-10-07: qty 120

## 2014-10-07 MED ORDER — INSULIN ASPART 100 UNIT/ML ~~LOC~~ SOLN
3.0000 [IU] | Freq: Once | SUBCUTANEOUS | Status: AC
  Administered 2014-10-07: 3 [IU] via SUBCUTANEOUS
  Filled 2014-10-07: qty 3

## 2014-10-07 NOTE — Progress Notes (Signed)
Hypoglycemic Event  CBG: 52 Treatment: 15 GM carbohydrate snack  Symptoms: None  Follow-up CBG: Time:0755 CBG Result:100  Possible Reasons for Event: Inadequate meal intake  Comments/MD notified: asymptomatic, CBG improved with juice, breakfast coming soon    Toniann Ket  Remember to initiate Hypoglycemia Order Set & complete

## 2014-10-07 NOTE — Progress Notes (Signed)
Alert and oriented. Sinus rhythm on telemetry. Rested quietly most of the day. No complaints. Family at bedside today. Full assessment as charted. Toniann Ket

## 2014-10-07 NOTE — Progress Notes (Signed)
[facility] at [place] NAME: [name]    MR#:  [id number]  DATE OF BIRTH:  [birth year]  SUBJECTIVE: Discharge heldheld  secondary to hypoxia.   CHIEF COMPLAINT:   Chief Complaint  Patient presents with  . Chest Pain  . Shortness of Breath    REVIEW OF SYSTEMS:    Review of Systems  Constitutional: Negative for fever and chills.  HENT: Negative for hearing loss.   Eyes: Negative for blurred vision, double vision and photophobia.  Respiratory: Positive for cough and shortness of breath. Negative for hemoptysis.   Cardiovascular: Negative for palpitations, orthopnea and leg swelling.  Gastrointestinal: Negative for vomiting, abdominal pain and diarrhea.  Genitourinary: Negative for dysuria and urgency.  Musculoskeletal: Negative for myalgias and neck pain.  Skin: Negative for rash.  Neurological: Negative for dizziness, focal weakness, seizures, weakness and headaches.  Psychiatric/Behavioral: Negative for memory loss. The patient does not have insomnia.     Nutrition: liquid diet Tolerating Diet: Tolerating PT:      DRUG ALLERGIES:  No Known Allergies  VITALS:  Blood pressure 141/48, pulse 77, temperature 98.7 F (37.1 C), temperature source Axillary, resp. rate 19, height 5\' 7"  (1.702 m), weight 67.178 kg (148 lb 1.6 oz), SpO2 100 %.  PHYSICAL EXAMINATION:   Physical Exam  GENERAL:  69 y.o.-year-old patient lying in the bed with no acute distress.  EYES: Pupils equal, round, reactive to light and accommodation. No scleral icterus. Extraocular muscles intact.  HEENT: Head atraumatic, normocephalic. Oropharynx and nasopharynx clear.  NECK:  Supple, no jugular venous distention. No thyroid enlargement, no tenderness.  LUNGS: Normal breath sounds bilaterally, no wheezing, rales,rhonchi or crepitation. No use of accessory muscles of respiration.  CARDIOVASCULAR: S1, S2 normal. No murmurs, rubs, or gallops.  ABDOMEN:  Mild left upper quadrant tenderness present, no rebound tenderness EXTREMITIES: No pedal edema, cyanosis, or clubbing.  NEUROLOGIC: Cranial nerves II through XII are intact. Muscle strength 5/5 in all extremities. Sensation intact. Gait not checked.  PSYCHIATRIC: The patient is alert and oriented x 3.  SKIN: No obvious rash, lesion, or ulcer.    LABORATORY PANEL:   CBC  Recent Labs Lab 10/07/14 0542  WBC 5.2  HGB 7.7*  HCT 23.9*  PLT 238   ------------------------------------------------------------------------------------------------------------------  Chemistries   Recent Labs Lab 10/06/14 1924 10/07/14 0542  NA 129* 131*  K 5.9* 5.6*  CL 99* 102  CO2 24 24  GLUCOSE 139* 49*  BUN 42* 43*  CREATININE 2.24* 2.06*  CALCIUM 7.6* 7.3*  AST 19  --   ALT 10*  --   ALKPHOS 46  --   BILITOT 0.4  --    ------------------------------------------------------------------------------------------------------------------  Cardiac Enzymes  Recent Labs Lab 10/03/14 1517  TROPONINI 0.06*   ------------------------------------------------------------------------------------------------------------------  RADIOLOGY:  Dg Chest 2 View  10/05/2014   CLINICAL DATA:  Hypoxia, recent hospitalization for chest pain, history hypertension, diabetes mellitus, coronary artery disease post angioplasty, hyperlipidemia, CHF, chronic kidney disease, former smoker  EXAM: CHEST  2 VIEW  COMPARISON:  10/02/2014  FINDINGS: Enlargement of cardiac silhouette with pulmonary vascular congestion.  Increased LEFT pleural effusion and basilar atelectasis.  Skin folds project over LEFT lung.  Tiny RIGHT pleural effusion with fluid at the major fissures bilaterally.  Slight chronic accentuation of interstitial markings without gross acute pulmonary edema or segmental consolidation.  No pneumothorax.  Bones demineralized.  IMPRESSION: Enlargement of cardiac silhouette with pulmonary vascular congestion.   Bibasilar effusions and atelectasis  greater on LEFT.   Electronically Signed   By: Lavonia Dana M.D.   On: 10/05/2014 10:19   Dg Chest Port 1 View  10/06/2014   CLINICAL DATA:  Oxygen desaturation.  EXAM: PORTABLE CHEST - 1 VIEW  COMPARISON:  10/05/2014  FINDINGS: Cardiac silhouette is enlarged. The bilateral pleural effusions increased compared to prior. Mild interstitial edema pattern no pneumothorax.  IMPRESSION: Interval increase in bilateral pleural effusions and mild interstitial edema.   Electronically Signed   By: Suzy Bouchard M.D.   On: 10/06/2014 14:36   Dg Abd Portable 1v  10/06/2014   CLINICAL DATA:  Acute onset of generalized abdominal pain and vomiting. Initial encounter.  EXAM: PORTABLE ABDOMEN - 1 VIEW  COMPARISON:  CT of the abdomen and pelvis from 04/06/2009, and lumbar spine radiographs performed 03/28/2014  FINDINGS: The visualized bowel gas pattern is unremarkable. Scattered air and stool filled loops of colon are seen; no abnormal dilatation of small bowel loops is seen to suggest small bowel obstruction. No free intra-abdominal air is identified, though evaluation for free air is limited on a single supine view.  Mild degenerative change is noted at the lumbar spine; the sacroiliac joints are unremarkable in appearance.  IMPRESSION: Unremarkable bowel gas pattern; no free intra-abdominal air seen. Small to moderate amount of stool noted in the colon.   Electronically Signed   By: Garald Balding M.D.   On: 10/06/2014 20:44     ASSESSMENT AND PLAN:  1.malena with possible upper gi bleed;hb low at  7.5 but stable,GI is following, had EGD done on May 23. Patient likely will have outpatient colonoscopy. Continue PPIs as we are doing now. Hemoglobin is stable. No indication for transfusion.,continue PPI,GI consult,avoid NSAIDs,ASA. 2.chest pain;pt functional study was normal and seen by cardiology is seen her,no indication for any work up due to anemia/gi bleed 3.DMII;continue  Amaryl 4.htn;controlled 5: Abdominal pain. Resolved with PPI abdominal x-ray is unremarkable.  6.acute hypoxic respiratory failure secondary to pulmonary edema: Continue Lasix, oxygen monitor closely on telemetry., Hold the discharge   All the records are reviewed and case discussed with Care Management/Social Workerr. Management plans discussed with the patient, family and they are in agreement.  CODE STATUS: full  TOTAL TIME TAKING CARE OF THIS PATIENT:35 minutes.   POSSIBLE D/C IN 1-2 DAYS, DEPENDING ON CLINICAL CONDITION.   Epifanio Lesches M.D on 10/07/2014 at 8:19 AM  Between 7am to 6pm - Pager - [phone]  After 6pm go to [email or web address] - password [email or web address] [facility]  Office  [phone]  CC: Primary care physician; Morton Peters, MD

## 2014-10-07 NOTE — Progress Notes (Signed)
[facility] [place] at [place] NAME: [name]    MR#:  [id number]  DATE OF BIRTH:  24-Jun-1945  SUBJECTIVE: Lethargic last night but vitals were stable. Patient to the right now is alert and oriented. Does have some audible wheezing and compresses or shortness of breath. Afebrile. O2 saturations were 79% on room air yesterday but the now she is on 2 L saturating 98%. BP stable. Transfused 1 unit of packed RBC yesterday. Hypoglycemic this morning blood sugar 49.   CHIEF COMPLAINT:   Chief Complaint  Patient presents with  . Chest Pain  . Shortness of Breath    REVIEW OF SYSTEMS:    Review of Systems  Constitutional: Negative for fever and chills.  HENT: Negative for hearing loss.   Eyes: Negative for blurred vision, double vision and photophobia.  Respiratory: Positive for cough, shortness of breath and wheezing. Negative for hemoptysis.   Cardiovascular: Negative for palpitations, orthopnea and leg swelling.  Gastrointestinal: Negative for vomiting, abdominal pain and diarrhea.  Genitourinary: Negative for dysuria and urgency.  Musculoskeletal: Negative for myalgias and neck pain.  Skin: Negative for rash.  Neurological: Negative for dizziness, focal weakness, seizures, weakness and headaches.  Psychiatric/Behavioral: Negative for memory loss. The patient does not have insomnia.     Nutrition: liquid diet Tolerating Diet: Tolerating PT:      DRUG ALLERGIES:  No Known Allergies  VITALS:  Blood pressure 141/48, pulse 77, temperature 98.7 F (37.1 C), temperature source Axillary, resp. rate 19, height 5\' 7"  (1.702 m), weight 67.178 kg (148 lb 1.6 oz), SpO2 100 %.  PHYSICAL EXAMINATION:   Physical Exam  GENERAL:  69 y.o.-year-old patient lying in the bed with with some wheezing. EYES: Pupils equal, round, reactive to light and accommodation. No scleral icterus. Extraocular muscles intact.  HEENT: Head atraumatic,  normocephalic. Oropharynx and nasopharynx clear.  NECK:  Supple, no jugular venous distention. No thyroid enlargement, no tenderness.  LUNGS: Bilateral expiratory movement in all lung fields. use of accessory muscles of respiration.  CARDIOVASCULAR: S1, S2 normal. No murmurs, rubs, or gallops.  ABDOMEN: Mild left upper quadrant tenderness present, no rebound tenderness EXTREMITIES: No pedal edema, cyanosis, or clubbing.  NEUROLOGIC: Cranial nerves II through XII are intact. Muscle strength 5/5 in all extremities. Sensation intact. Gait not checked.  PSYCHIATRIC: The patient is alert and oriented x 3.  SKIN: No obvious rash, lesion, or ulcer.    LABORATORY PANEL:   CBC  Recent Labs Lab 10/07/14 0542  WBC 5.2  HGB 7.7*  HCT 23.9*  PLT 238   ------------------------------------------------------------------------------------------------------------------  Chemistries   Recent Labs Lab 10/06/14 1924 10/07/14 0542  NA 129* 131*  K 5.9* 5.6*  CL 99* 102  CO2 24 24  GLUCOSE 139* 49*  BUN 42* 43*  CREATININE 2.24* 2.06*  CALCIUM 7.6* 7.3*  AST 19  --   ALT 10*  --   ALKPHOS 46  --   BILITOT 0.4  --    ------------------------------------------------------------------------------------------------------------------  Cardiac Enzymes  Recent Labs Lab 10/03/14 1517  TROPONINI 0.06*   ------------------------------------------------------------------------------------------------------------------  RADIOLOGY:  Dg Chest 2 View  10/05/2014   CLINICAL DATA:  Hypoxia, recent hospitalization for chest pain, history hypertension, diabetes mellitus, coronary artery disease post angioplasty, hyperlipidemia, CHF, chronic kidney disease, former smoker  EXAM: CHEST  2 VIEW  COMPARISON:  10/02/2014  FINDINGS: Enlargement of cardiac silhouette with pulmonary vascular congestion.  Increased LEFT pleural effusion and basilar atelectasis.  Skin folds project  over LEFT lung.  Tiny RIGHT  pleural effusion with fluid at the major fissures bilaterally.  Slight chronic accentuation of interstitial markings without gross acute pulmonary edema or segmental consolidation.  No pneumothorax.  Bones demineralized.  IMPRESSION: Enlargement of cardiac silhouette with pulmonary vascular congestion.  Bibasilar effusions and atelectasis greater on LEFT.   Electronically Signed   By: Lavonia Dana M.D.   On: 10/05/2014 10:19   Dg Chest Port 1 View  10/06/2014   CLINICAL DATA:  Oxygen desaturation.  EXAM: PORTABLE CHEST - 1 VIEW  COMPARISON:  10/05/2014  FINDINGS: Cardiac silhouette is enlarged. The bilateral pleural effusions increased compared to prior. Mild interstitial edema pattern no pneumothorax.  IMPRESSION: Interval increase in bilateral pleural effusions and mild interstitial edema.   Electronically Signed   By: Suzy Bouchard M.D.   On: 10/06/2014 14:36   Dg Abd Portable 1v  10/06/2014   CLINICAL DATA:  Acute onset of generalized abdominal pain and vomiting. Initial encounter.  EXAM: PORTABLE ABDOMEN - 1 VIEW  COMPARISON:  CT of the abdomen and pelvis from 04/06/2009, and lumbar spine radiographs performed 03/28/2014  FINDINGS: The visualized bowel gas pattern is unremarkable. Scattered air and stool filled loops of colon are seen; no abnormal dilatation of small bowel loops is seen to suggest small bowel obstruction. No free intra-abdominal air is identified, though evaluation for free air is limited on a single supine view.  Mild degenerative change is noted at the lumbar spine; the sacroiliac joints are unremarkable in appearance.  IMPRESSION: Unremarkable bowel gas pattern; no free intra-abdominal air seen. Small to moderate amount of stool noted in the colon.   Electronically Signed   By: Garald Balding M.D.   On: 10/06/2014 20:44     ASSESSMENT AND PLAN:  1.malena with possible upper gi bleed;hb low at  7.5 but stable,GI is following, had EGD done on May 23. Patient likely will have  outpatient colonoscopy. Continue PPIs as we are doing now. Hemoglobin is stable. No indication for transfusion.,continue PPI,GI consult,avoid NSAIDs,ASA. 2.chest pain;pt functional study was normal and seen by cardiology is seen her,no indication for any work up due to anemia/gi bleed 3.DMII; hold Amaryl, monitor closely has hypoglycemia this morning. Continue blood sugar checks every 2 hours. Use sliding scale with coverage if sugar is more than 150 consecutively for 2 times.  5: Abdominal pain. Resolved with PPI abdominal x-ray is unremarkable.  6. Lethargy ; last night ABG unremarkable C one unit of transfusion patient has hyperkalemia but now it is improved to 5.6 we will give Lasix, Kayexalate. Patient does have pulmonary edema on x-ray so continue Lasix,  empirically is giving the bank Zosyn and Solu-Medrol nebulizers for healthcare associated pneumonia. 7. Acute on chronic renal failure with CK disease stage III; renal function is better monitor closely. All the records are reviewed and case discussed with Care Management/Social Workerr. Management plans discussed with the patient, family and they are in agreement.  CODE STATUS: full  TOTAL TIME TAKING CARE OF THIS PATIENT:35 minutes.   POSSIBLE D/C,EPENDING ON CLINICAL CONDITION.   Epifanio Lesches M.D on 10/07/2014 at 8:21 AM  Between 7am to 6pm - Pager - [phone]  After 6pm go to [email or web address] - password [email or web address] [facility]  Office  [phone]  CC: Primary care physician; Morton Peters, MD

## 2014-10-07 NOTE — Progress Notes (Signed)
Dr. Vianne Bulls notified of q3h CBG check of 256, however sliding scale insulin is not due until 1700 per order. Per Dr. Vianne Bulls. Change CBG monitoring and current sliding scale orders to q4 hours, and go ahead and give 3 units novolog now.   Toniann Ket

## 2014-10-08 ENCOUNTER — Inpatient Hospital Stay: Payer: Medicare HMO

## 2014-10-08 LAB — TYPE AND SCREEN
ABO/RH(D): O POS
Antibody Screen: NEGATIVE
UNIT DIVISION: 0

## 2014-10-08 LAB — BASIC METABOLIC PANEL
Anion gap: 6 (ref 5–15)
BUN: 54 mg/dL — AB (ref 6–20)
CO2: 25 mmol/L (ref 22–32)
Calcium: 7.4 mg/dL — ABNORMAL LOW (ref 8.9–10.3)
Chloride: 101 mmol/L (ref 101–111)
Creatinine, Ser: 1.94 mg/dL — ABNORMAL HIGH (ref 0.44–1.00)
GFR calc Af Amer: 29 mL/min — ABNORMAL LOW (ref >60)
GFR calc non Af Amer: 25 mL/min — ABNORMAL LOW (ref >60)
Glucose, Bld: 115 mg/dL — ABNORMAL HIGH (ref 65–99)
Potassium: 4.6 mmol/L (ref 3.5–5.1)
SODIUM: 132 mmol/L — AB (ref 135–145)

## 2014-10-08 LAB — GLUCOSE, CAPILLARY
GLUCOSE-CAPILLARY: 152 mg/dL — AB (ref 65–99)
GLUCOSE-CAPILLARY: 112 mg/dL — AB (ref 65–99)
GLUCOSE-CAPILLARY: 197 mg/dL — AB (ref 65–99)
Glucose-Capillary: 233 mg/dL — ABNORMAL HIGH (ref 65–99)
Glucose-Capillary: 301 mg/dL — ABNORMAL HIGH (ref 65–99)
Glucose-Capillary: 355 mg/dL — ABNORMAL HIGH (ref 65–99)
Glucose-Capillary: 412 mg/dL — ABNORMAL HIGH (ref 65–99)

## 2014-10-08 LAB — CBC
HCT: 24.4 % — ABNORMAL LOW (ref 35.0–47.0)
Hemoglobin: 8 g/dL — ABNORMAL LOW (ref 12.0–16.0)
MCH: 24.5 pg — ABNORMAL LOW (ref 26.0–34.0)
MCHC: 32.7 g/dL (ref 32.0–36.0)
MCV: 75 fL — ABNORMAL LOW (ref 80.0–100.0)
Platelets: 239 10*3/uL (ref 150–440)
RBC: 3.26 MIL/uL — ABNORMAL LOW (ref 3.80–5.20)
RDW: 20.5 % — AB (ref 11.5–14.5)
WBC: 4.6 10*3/uL (ref 3.6–11.0)

## 2014-10-08 LAB — CULTURE, BLOOD (ROUTINE X 2)
CULTURE: NO GROWTH
Culture: NO GROWTH

## 2014-10-08 MED ORDER — GUAIFENESIN 100 MG/5ML PO SYRP
100.0000 mg | ORAL_SOLUTION | Freq: Four times a day (QID) | ORAL | Status: DC | PRN
  Administered 2014-10-08 – 2014-10-09 (×3): 100 mg via ORAL
  Filled 2014-10-08 (×4): qty 5

## 2014-10-08 MED ORDER — INSULIN ASPART 100 UNIT/ML ~~LOC~~ SOLN
10.0000 [IU] | SUBCUTANEOUS | Status: AC
  Administered 2014-10-08: 10 [IU] via SUBCUTANEOUS
  Filled 2014-10-08: qty 10

## 2014-10-08 MED ORDER — FERROUS SULFATE 325 (65 FE) MG PO TABS
325.0000 mg | ORAL_TABLET | Freq: Two times a day (BID) | ORAL | Status: DC
  Administered 2014-10-08 – 2014-10-09 (×3): 325 mg via ORAL
  Filled 2014-10-08 (×3): qty 1

## 2014-10-08 MED ORDER — LEVOFLOXACIN 500 MG PO TABS: 500.0000 mg | ORAL_TABLET | Freq: Every day | ORAL | Status: DC

## 2014-10-08 MED ORDER — LEVOFLOXACIN 500 MG PO TABS
500.0000 mg | ORAL_TABLET | Freq: Once | ORAL | Status: AC
  Administered 2014-10-08: 500 mg via ORAL
  Filled 2014-10-08: qty 1

## 2014-10-08 MED ORDER — LEVOFLOXACIN 250 MG PO TABS
250.0000 mg | ORAL_TABLET | Freq: Every day | ORAL | Status: DC
  Administered 2014-10-09: 250 mg via ORAL
  Filled 2014-10-08 (×2): qty 1

## 2014-10-08 NOTE — Progress Notes (Signed)
ANTIBIOTIC CONSULT NOTE - Initial  Pharmacy Consult for Levaquin  Indication: pneumonia/HCAP?  No Known Allergies  Patient Measurements: Height: 5\' 7"  (170.2 cm) Weight: 147 lb 14.4 oz (67.087 kg) IBW/kg (Calculated) : 61.6   Vital Signs: Temp: 98.4 F (36.9 C) (06/06 1105) Temp Source: Oral (06/06 1105) BP: 133/52 mmHg (06/06 1105) Pulse Rate: 70 (06/06 1105) Intake/Output from previous day: 06/05 0701 - 06/06 0700 In: 460 [P.O.:360; IV Piggyback:100] Out: 350 [Urine:350] Intake/Output from this shift: Total I/O In: 240 [P.O.:240] Out: 350 [Urine:350]  Labs:  Recent Labs  10/06/14 1924 10/07/14 0542 10/08/14 0801  WBC 6.1 5.2 4.6  HGB 7.6* 7.7* 8.0*  PLT 276 238 239  CREATININE 2.24* 2.06* 1.94*   Estimated Creatinine Clearance: 26.6 mL/min (by C-G formula based on Cr of 1.94).  Microbiology: Recent Results (from the past 720 hour(s))  Culture, blood (routine x 2)     Status: None   Collection Time: 09/17/14 11:20 AM  Result Value Ref Range Status   Specimen Description BLOOD  Final   Special Requests LEFT ARM  Final   Culture NO GROWTH 5 DAYS  Final   Report Status 09/22/2014 FINAL  Final  Culture, blood (routine x 2)     Status: None   Collection Time: 09/17/14 11:20 AM  Result Value Ref Range Status   Specimen Description BLOOD  Final   Special Requests RIGHT AC  Final   Culture NO GROWTH 5 DAYS  Final   Report Status 09/22/2014 FINAL  Final  Urine culture     Status: None   Collection Time: 10/03/14  5:48 PM  Result Value Ref Range Status   Specimen Description URINE, RANDOM  Final   Special Requests NONE  Final   Culture   Final    30,000 COLONIES/ml ENTEROCOCCUS FAECALIS MULTIPLE SPECIES PRESENT    Report Status 10/06/2014 FINAL  Final   Organism ID, Bacteria ENTEROCOCCUS FAECALIS  Final      Susceptibility   Enterococcus faecalis - MIC*    AMPICILLIN <=2 SENSITIVE Sensitive     CIPROFLOXACIN >=8 RESISTANT Resistant     LEVOFLOXACIN >=8  RESISTANT Resistant     TETRACYCLINE >=16 RESISTANT Resistant     LINEZOLID 2 SENSITIVE Sensitive     * 30,000 COLONIES/ml ENTEROCOCCUS FAECALIS  Culture, blood (routine x 2)     Status: None   Collection Time: 10/03/14  7:03 PM  Result Value Ref Range Status   Specimen Description BLOOD  Final   Special Requests BLOOD  Final   Culture NO GROWTH 5 DAYS  Final   Report Status 10/08/2014 FINAL  Final  Culture, blood (routine x 2)     Status: None   Collection Time: 10/03/14  7:03 PM  Result Value Ref Range Status   Specimen Description BLOOD  Final   Special Requests BLOOD  Final   Culture NO GROWTH 5 DAYS  Final   Report Status 10/08/2014 FINAL  Final    Medical History: Past Medical History  Diagnosis Date  . Hypertension   . Diabetes mellitus without complication   . Anemia   . Coronary artery disease   . CHF (congestive heart failure)   . Glaucoma   . Hyperlipidemia   . Chronic kidney disease   . Neuropathy   . Chronic anemia     Medications:  Scheduled:  . docusate sodium  100 mg Oral BID  . feeding supplement (GLUCERNA SHAKE)  237 mL Oral BID WC  . ferrous  sulfate  325 mg Oral BID WC  . furosemide  40 mg Intravenous BID  . insulin aspart  0-9 Units Subcutaneous 6 times per day  . isosorbide dinitrate  30 mg Oral BID  . labetalol  200 mg Oral BID  . [START ON 10/09/2014] levofloxacin  250 mg Oral Daily  . pantoprazole (PROTONIX) IV  40 mg Intravenous Q12H  . ramipril  5 mg Oral Daily  . simvastatin  20 mg Oral QPM   Assessment: 69 yo female with hypoxia,PNA/?HCAP. Admitted with melena, ?upper GI bleed. Patient on Vancomycin Lajean Silvius starting 10/06/14, now transition to [place]. BCx x2 NGTD Ucx: Enterococcus faecalis  Goal of Therapy:  PNA/UTI resolution  Plan:  Will order Levaquin 500mg  PO x 1 and then continue with Levaquin 250mg  PO Q24h per renal fxn. Continue to monitor renal fxn.  Chinita Greenland PharmD Clinical Pharmacist 10/08/2014

## 2014-10-08 NOTE — Progress Notes (Signed)
[facility] at [place] NAME: [name]    MR#:  [id number]  DATE OF BIRTH:  [birth year]    CHIEF COMPLAINT:   Chief Complaint  Patient presents with  . Chest Pain  . Shortness of Breath   Doing well this morning. Eating breakfast. Son at bedside, feels she is at baseline.  REVIEW OF SYSTEMS:    Review of Systems  Constitutional: Negative for fever.  Respiratory: Negative for shortness of breath.   Cardiovascular: Negative for chest pain and palpitations.  Gastrointestinal: Negative for nausea, vomiting and abdominal pain.  Genitourinary: Negative for dysuria.   DRUG ALLERGIES:  No Known Allergies  VITALS:  Blood pressure 133/52, pulse 70, temperature 98.4 F (36.9 C), temperature source Oral, resp. rate 18, height 5\' 7"  (1.702 m), weight 67.087 kg (147 lb 14.4 oz), SpO2 93 %.  PHYSICAL EXAMINATION:   Physical Exam  GENERAL:  69 y.o.-year-old patient lying in the bed eating breakfast EYES: Pupils equal, round, reactive to light and accommodation. No scleral icterus. Extraocular muscles intact.  HEENT: Head atraumatic, normocephalic. Oropharynx and nasopharynx clear.  NECK:  Supple, no jugular venous distention. No thyroid enlargement, no tenderness.  LUNGS: clear with good air movement CARDIOVASCULAR: S1, S2 normal. No murmurs, rubs, or gallops.  ABDOMEN: Mild left upper quadrant tenderness present, no rebound tenderness  EXTREMITIES: No pedal edema, cyanosis, or clubbing.  NEUROLOGIC: Cranial nerves II through XII are intact. Muscle strength 5/5 in all extremities. Sensation intact. Gait not checked.  PSYCHIATRIC: The patient is alert and oriented x 3.  SKIN: No obvious rash, lesion, or ulcer.    LABORATORY PANEL:   CBC  Recent Labs Lab 10/08/14 0801  WBC 4.6  HGB 8.0*  HCT 24.4*  PLT 239    ------------------------------------------------------------------------------------------------------------------  Chemistries   Recent Labs Lab 10/06/14 1924  10/08/14 0801  NA 129*  < > 132*  K 5.9*  < > 4.6  CL 99*  < > 101  CO2 24  < > 25  GLUCOSE 139*  < > 115*  BUN 42*  < > 54*  CREATININE 2.24*  < > 1.94*  CALCIUM 7.6*  < > 7.4*  AST 19  --   --   ALT 10*  --   --   ALKPHOS 46  --   --   BILITOT 0.4  --   --   < > = values in this interval not displayed. ------------------------------------------------------------------------------------------------------------------  Cardiac Enzymes  Recent Labs Lab 10/03/14 1517  TROPONINI 0.06*   ------------------------------------------------------------------------------------------------------------------  RADIOLOGY:  Dg Chest Port 1 View  10/08/2014   CLINICAL DATA:  Hypoxia, chest pain, shortness of breath.  EXAM: PORTABLE CHEST - 1 VIEW  COMPARISON:  10/06/2014  FINDINGS: There is cardiomegaly. Improving aeration in the lungs with decreasing bilateral effusions and bibasilar opacities. Mild vascular congestion, also improved. No acute airspace scratch head no acute bony abnormality.  IMPRESSION: Improving aeration with decreasing effusions and bibasilar opacities. Decreasing vascular congestion.   Electronically Signed   By: Rolm Baptise M.D.   On: 10/08/2014 11:46   Dg Abd Portable 1v  10/06/2014   CLINICAL DATA:  Acute onset of generalized abdominal pain and vomiting. Initial encounter.  EXAM: PORTABLE ABDOMEN - 1 VIEW  COMPARISON:  CT of the abdomen and pelvis from 04/06/2009, and lumbar spine radiographs performed 03/28/2014  FINDINGS: The visualized bowel gas pattern is unremarkable. Scattered air and stool filled loops of colon are seen; no  abnormal dilatation of small bowel loops is seen to suggest small bowel obstruction. No free intra-abdominal air is identified, though evaluation for free air is limited on a single  supine view.  Mild degenerative change is noted at the lumbar spine; the sacroiliac joints are unremarkable in appearance.  IMPRESSION: Unremarkable bowel gas pattern; no free intra-abdominal air seen. Small to moderate amount of stool noted in the colon.   Electronically Signed   By: Garald Balding M.D.   On: 10/06/2014 20:44     ASSESSMENT AND PLAN:  1.melena with possible upper gi bleed; - hgb fairly stable 7.6-7.7, then transfused 1 u prbc's to 8 - no further bleeding noted - continue PPI, no NSAIDS or ASA - will need to schedule outpatient follow up with Dr. Tiffany Kocher  2. Chest pain - seen by cardiology, no further work up planned  3. DM 2 - hypoglycemic in hospital - holding amaryl - now on SSI  4. Hyperkalemia - improved, now 4.6  5. Pulmonary edema on CXR: improving - continue lasix, seems improved - de-escalate anx to levaquin stop vanc and zosyn  6. Acute on CKD 3 - renal function stable  7. dispo - assess for home 02 - PT evaluation today  All the records are reviewed and case discussed with Care Management/Social Workerr. Management plans discussed with the patient, family and they are in agreement.  CODE STATUS: full  TOTAL TIME TAKING CARE OF THIS PATIENT:35 minutes.   POSSIBLE D/C today  Myrtis Ser M.D on 10/08/2014 at 1:19 PM  Between 7am to 6pm - Pager - [phone]  After 6pm go to [email or web address] - password [email or web address] [facility]  Office  [phone]  CC: Primary care physician; Morton Peters, MD

## 2014-10-08 NOTE — Progress Notes (Signed)
Room air. NSR. FS are stable. Confused. Ambulated multi times around the nurses station. Takes meds ok. Wean to room air. Pt has not reported any pain. Up to [facility]. Pt has no further concerns at this time.

## 2014-10-08 NOTE — Progress Notes (Signed)
ANTIBIOTIC CONSULT NOTE - Follow up  Pharmacy Consult for Vancomycin/Zosyn Indication: pneumonia/HCAP?  No Known Allergies  Patient Measurements: Height: 5\' 7"  (170.2 cm) Weight: 147 lb 14.4 oz (67.087 kg) IBW/kg (Calculated) : 61.6   Vital Signs: Temp: 98.4 F (36.9 C) (06/06 1105) Temp Source: Oral (06/06 1105) BP: 133/52 mmHg (06/06 1105) Pulse Rate: 70 (06/06 1105) Intake/Output from previous day: 06/05 0701 - 06/06 0700 In: 460 [P.O.:360; IV Piggyback:100] Out: 350 [Urine:350] Intake/Output from this shift: Total I/O In: 240 [P.O.:240] Out: 350 [Urine:350]  Labs:  Recent Labs  10/06/14 1924 10/07/14 0542 10/08/14 0801  WBC 6.1 5.2 4.6  HGB 7.6* 7.7* 8.0*  PLT 276 238 239  CREATININE 2.24* 2.06* 1.94*   Estimated Creatinine Clearance: 26.6 mL/min (by C-G formula based on Cr of 1.94).  Microbiology: Recent Results (from the past 720 hour(s))  Culture, blood (routine x 2)     Status: None   Collection Time: 09/17/14 11:20 AM  Result Value Ref Range Status   Specimen Description BLOOD  Final   Special Requests LEFT ARM  Final   Culture NO GROWTH 5 DAYS  Final   Report Status 09/22/2014 FINAL  Final  Culture, blood (routine x 2)     Status: None   Collection Time: 09/17/14 11:20 AM  Result Value Ref Range Status   Specimen Description BLOOD  Final   Special Requests RIGHT AC  Final   Culture NO GROWTH 5 DAYS  Final   Report Status 09/22/2014 FINAL  Final  Urine culture     Status: None   Collection Time: 10/03/14  5:48 PM  Result Value Ref Range Status   Specimen Description URINE, RANDOM  Final   Special Requests NONE  Final   Culture   Final    30,000 COLONIES/ml ENTEROCOCCUS FAECALIS MULTIPLE SPECIES PRESENT    Report Status 10/06/2014 FINAL  Final   Organism ID, Bacteria ENTEROCOCCUS FAECALIS  Final      Susceptibility   Enterococcus faecalis - MIC*    AMPICILLIN <=2 SENSITIVE Sensitive     CIPROFLOXACIN >=8 RESISTANT Resistant    LEVOFLOXACIN >=8 RESISTANT Resistant     TETRACYCLINE >=16 RESISTANT Resistant     LINEZOLID 2 SENSITIVE Sensitive     * 30,000 COLONIES/ml ENTEROCOCCUS FAECALIS  Culture, blood (routine x 2)     Status: None   Collection Time: 10/03/14  7:03 PM  Result Value Ref Range Status   Specimen Description BLOOD  Final   Special Requests BLOOD  Final   Culture NO GROWTH 5 DAYS  Final   Report Status 10/08/2014 FINAL  Final  Culture, blood (routine x 2)     Status: None   Collection Time: 10/03/14  7:03 PM  Result Value Ref Range Status   Specimen Description BLOOD  Final   Special Requests BLOOD  Final   Culture NO GROWTH 5 DAYS  Final   Report Status 10/08/2014 FINAL  Final    Medical History: Past Medical History  Diagnosis Date  . Hypertension   . Diabetes mellitus without complication   . Anemia   . Coronary artery disease   . CHF (congestive heart failure)   . Glaucoma   . Hyperlipidemia   . Chronic kidney disease   . Neuropathy   . Chronic anemia     Medications:  Scheduled:  . docusate sodium  100 mg Oral BID  . feeding supplement (GLUCERNA SHAKE)  237 mL Oral BID WC  . ferrous sulfate  325 mg Oral BID WC  . furosemide  40 mg Intravenous BID  . insulin aspart  0-9 Units Subcutaneous 6 times per day  . isosorbide dinitrate  30 mg Oral BID  . labetalol  200 mg Oral BID  . methylPREDNISolone (SOLU-MEDROL) injection  60 mg Intravenous Q24H  . pantoprazole (PROTONIX) IV  40 mg Intravenous Q12H  . piperacillin-tazobactam (ZOSYN)  IV  3.375 g Intravenous 3 times per day  . ramipril  5 mg Oral Daily  . simvastatin  20 mg Oral QPM  . vancomycin  1,000 mg Intravenous Q36H   Assessment: 69 yo female with hypoxia, broadening abx coverage for PNA/?HCAP BCx x2 NGTD Ucx: Enterococcus faecalis  Goal of Therapy:  Vancomycin trough level 15-20 mcg/ml  Plan:  6/4:   Zosyn 3.375 g IV q8h EI.  Vancomycin 1 g IV x1 dose for now .- SCr 2.10, CrCl ~ 24.6 ml/min Ke 0.025, half  life 27.7 h, Vd 47.4 L Will order vancomycin 1 g IV q36 h, next dose 6/5 at 0430 Will order vanc trough prior to 3rd dose (not at steady state) on 6/8 at 0400 Will need to continue to monitor renal function (BMET ordered for tomorrow).  6/6: Will continue current regimen of Zosyn and Vancomycin. Continue to monitor renal fxn.  Chinita Greenland PharmD Clinical Pharmacist 10/08/2014

## 2014-10-08 NOTE — Care Management (Signed)
Patient's 02 sats on room air with exertion 92-93.  Physical therapy is recommending home with home health and 24hr supervision due to mental status.  It is felt that today patient is at her baseline and would anticipate discharge home.  She had EGD and is now on clear liquids.  Anticipate discharge within the next 24 - 48 hours either to home or SNF.  Have left message for patient's son to discuss

## 2014-10-08 NOTE — Progress Notes (Signed)
Nutrition Follow-up  DOCUMENTATION CODES:     INTERVENTION:   (Medical Nutrition Supplement) Continue glucerna BID for added nutrition   NUTRITION DIAGNOSIS:  Inadequate oral intake related to  (Poor PO) as evidenced by percent weight loss, improving.    GOAL:  Patient will meet greater than or equal to 90% of their needs    MONITOR:   (Energy Intake, Electrolyte and Renal Profile)  REASON FOR ASSESSMENT:  Malnutrition Screening Tool    ASSESSMENT:  Pt reports intake is improving. Ate 100% of soup today, drank tea and reports drinking 100% of glucerna BID for added nutrition  Per I and O sheet intake 100% per documentation (4 meals) bites for 1 other meal.    Height:  Ht Readings from Last 1 Encounters:  10/02/14 5\' 7"  (1.702 m)    Weight:  Wt Readings from Last 1 Encounters:  10/08/14 147 lb 14.4 oz (67.087 kg)     BMI:  Body mass index is 23.16 kg/(m^2).  Estimated Nutritional Needs:  Kcal:  1754-2073 kcal/ day (BEE: 1227 kcals x 1.1-1.3 IF x 1.3 AF)  Protein:  67-87 kcal/ day (1.0-1.3 g/ kg/ day)  Fluid:  1675-2010 ml/ day (25-30 ml/ kg)  Skin:  Reviewed, no issues  Diet Order:  Diet heart healthy/carb modified Room service appropriate?: Yes; Fluid consistency:: Thin  EDUCATION NEEDS:  No education needs identified at this time   Intake/Output Summary (Last 24 hours) at 10/08/14 1515 Last data filed at 10/08/14 0900  Gross per 24 hour  Intake    410 ml  Output    700 ml  Net   -290 ml    Last BM:  6/5    LOW Care Level Carroll Lingelbach B. Zenia Resides, [place], [place] (pager)

## 2014-10-08 NOTE — Evaluation (Signed)
Physical Therapy Evaluation Patient Details Name: [name] MRN: [id number] DOB: 06/16/1945 Today's Date: 10/08/2014   History of Present Illness  presented to ER secondary to L-sided chest pain resolved with sublingual nitrogen, SOB; admitted with melena, GIB (HgB 8.0).  Hospital course complicated by acute hypoxic respiratory failure due to pulmonary failure.  Of note, patient with recent hospitalization secondary to PNA; followed by outpatient EGD (significant for gastritis) and incomplete attempted colonoscopy.  Clinical Impression  Upon evaluation, patient alert and oriented to self, location only; generally confused with significant memory and safety awareness deficits.  Strength and ROM Grossly WFL for basic transfers and mobility; globally impulsive with higher-level balance deficits.  Able to complete bed mobility indep; sit/stand, basic transfers and gait (200') without assist device, cga/close sup.  May benefit from trial of [facility] next session.  Maintains sats >90% on RA throughout session. Would benefit from skilled PT to address above deficits and promote optimal return to PLOF; if 24 hour sup/assist available, okay for home with HHPT, otherwise may benefit from STR until cognition clears.    Follow Up Recommendations Home health PT (if 24 hour sup/assist available, okay for HHPT.  If not, patient unsafe to be home alone; recommend STR)    Equipment Recommendations       Recommendations for Other Services       Precautions / Restrictions Precautions Precautions: Fall Restrictions Weight Bearing Restrictions: No      Mobility  Bed Mobility Overal bed mobility: Independent                Transfers Overall transfer level: Needs assistance   Transfers: Sit to/from Stand Sit to Stand: Min guard         General transfer comment: impulsive  Ambulation/Gait Ambulation/Gait assistance: Min guard Ambulation Distance (Feet): 200 Feet         General Gait  Details: broad BOS with excessive sway bilat; veering laterally at times with head turns (self-corrects balance disturbances with LE step strategy)  Stairs            Wheelchair Mobility    Modified Rankin (Stroke Patients Only)       Balance Overall balance assessment: Needs assistance Sitting-balance support: No upper extremity supported;Feet supported Sitting balance-Leahy Scale: Good     Standing balance support: No upper extremity supported Standing balance-Leahy Scale: Fair   Single Leg Stance - Right Leg: 1 Single Leg Stance - Left Leg: 0           High Level Balance Comments: standing functional reach approx 4-5" from immediate BOS; able to retrieve item from floor safely/easily             Pertinent Vitals/Pain Pain Assessment: No/denies pain    Home Living Family/patient expects to be discharged to:: Private residence Living Arrangements: Spouse/significant other Available Help at Discharge: Family (not present to confirm availability to provide sup/assist  upon discharge) Type of Home: House Home Access: Stairs to enter Entrance Stairs-Rails: Can reach both Entrance Stairs-Number of Steps: 2 Home Layout: Two level;Able to live on main level with bedroom/bathroom Home Equipment: None      Prior Function Level of Independence: Independent               Hand Dominance        Extremity/Trunk Assessment   Upper Extremity Assessment: Overall WFL for tasks assessed           Lower Extremity Assessment: Overall WFL for tasks assessed  Communication   Communication: No difficulties  Cognition Arousal/Alertness: Awake/alert Behavior During Therapy: WFL for tasks assessed/performed Overall Cognitive Status:  (oriented to self, location and month; generally confused and disoriented (unable to locate room around unit or navigate from bathroom to bed); poor STM)                      General Comments      Exercises  Other Exercises Other Exercises: Toilet transfer, ambulatory without assist device, cga/close sup.  Able to complete sit/stand from standard toilet height with grab bar, sup.  Standing balacne for hygiene, clothing management and hand hygiene at sink, close sup. (8 min)      Assessment/Plan    PT Assessment Patient needs continued PT services  PT Diagnosis Difficulty walking;Generalized weakness   PT Problem List Decreased strength;Decreased range of motion;Decreased activity tolerance;Decreased balance;Decreased mobility;Decreased coordination;Decreased knowledge of use of DME;Decreased safety awareness;Cardiopulmonary status limiting activity;Decreased cognition  PT Treatment Interventions DME instruction;Gait training;Stair training;Functional mobility training;Therapeutic activities;Therapeutic exercise;Balance training;Neuromuscular re-education;Patient/family education;Cognitive remediation   PT Goals (Current goals can be found in the Care Plan section) Acute Rehab PT Goals Patient Stated Goal: "to rest a bit; I've walked three times already today" PT Goal Formulation: With patient Time For Goal Achievement: 10/22/14 Potential to Achieve Goals: Good    Frequency Min 2X/week   Barriers to discharge        Co-evaluation               End of Session Equipment Utilized During Treatment: Gait belt Activity Tolerance: Patient tolerated treatment well Patient left: in bed;with call bell/phone within reach;with family/visitor present Nurse Communication: Mobility status         Time: 6122-4497 PT Time Calculation (min) (ACUTE ONLY): 14 min   Charges:   PT Evaluation $Initial PT Evaluation Tier I: 1 Procedure PT Treatments $Therapeutic Activity: 8-22 mins   PT G Codes:        Camron Monday H. Owens Shark, PT, DPT 10/08/2014, 4:52 PM [phone]

## 2014-10-08 NOTE — Progress Notes (Signed)
   10/08/14 1700  Clinical Encounter Type  Visited With Patient and family together  Visit Type Initial  Spiritual Encounters  Spiritual Needs Prayer  Stress Factors  Patient Stress Factors None identified  Family Stress Factors None identified    Faith Tradition: [employer] Status: coping with melena Family Present: Doristine Bosworth and his wife plus her husband is at bedside. She has a strong family unit/support Assessment: Chaplain introduced pastoral care 24x7 to the patient and her visitors. The patient expressed that she's in good spirits. 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 Her husband says that she had heart stints placed. She will be lifted up in prayer for continuous healing and comfort.  For any Pastoral Care needs, the chaplains can be reached daily (24x7) by submitting a referral or via on-call pager, [phone]

## 2014-10-09 LAB — GLUCOSE, CAPILLARY
Glucose-Capillary: 100 mg/dL — ABNORMAL HIGH (ref 65–99)
Glucose-Capillary: 81 mg/dL (ref 65–99)
Glucose-Capillary: 143 mg/dL — ABNORMAL HIGH (ref 65–99)

## 2014-10-09 LAB — BASIC METABOLIC PANEL
Anion gap: 6 (ref 5–15)
BUN: 62 mg/dL — AB (ref 6–20)
CO2: 26 mmol/L (ref 22–32)
CREATININE: 1.95 mg/dL — AB (ref 0.44–1.00)
Calcium: 7.3 mg/dL — ABNORMAL LOW (ref 8.9–10.3)
Chloride: 100 mmol/L — ABNORMAL LOW (ref 101–111)
GFR calc Af Amer: 29 mL/min — ABNORMAL LOW (ref >60)
GFR, EST NON AFRICAN AMERICAN: 25 mL/min — AB (ref >60)
Glucose, Bld: 72 mg/dL (ref 65–99)
POTASSIUM: 4.6 mmol/L (ref 3.5–5.1)
Sodium: 132 mmol/L — ABNORMAL LOW (ref 135–145)

## 2014-10-09 LAB — CBC
HCT: 22.9 % — ABNORMAL LOW (ref 35.0–47.0)
Hemoglobin: 7.8 g/dL — ABNORMAL LOW (ref 12.0–16.0)
MCH: 25.4 pg — ABNORMAL LOW (ref 26.0–34.0)
MCHC: 34 g/dL (ref 32.0–36.0)
MCV: 74.7 fL — AB (ref 80.0–100.0)
PLATELETS: 288 10*3/uL (ref 150–440)
RBC: 3.06 MIL/uL — ABNORMAL LOW (ref 3.80–5.20)
RDW: 21.1 % — AB (ref 11.5–14.5)
WBC: 5.2 10*3/uL (ref 3.6–11.0)

## 2014-10-09 MED ORDER — LEVOFLOXACIN 250 MG PO TABS
250.0000 mg | ORAL_TABLET | Freq: Every day | ORAL | Status: DC
Start: 2014-10-09 — End: 2014-11-02

## 2014-10-09 NOTE — Discharge Summary (Signed)
[facility]   PATIENT NAME: [name]    MR#:  [id number]  DATE OF BIRTH:  1945/05/20  DATE OF ADMISSION:  10/02/2014 ADMITTING PHYSICIAN: Juluis Mire, MD  DATE OF DISCHARGE: 10/09/2014  PRIMARY CARE PHYSICIAN: Former PCP Dr. Hardin Negus. She would like to change PCP.   ADMISSION DIAGNOSIS:  Upper GI bleeding [K92.2] Chest pain of uncertain etiology [S06.30]  DISCHARGE DIAGNOSIS:  Principal Problem:   Melena Active Problems:   DM (diabetes mellitus)   Essential hypertension   Coronary atherosclerosis   HTN (hypertension), malignant   Chest pain   SECONDARY DIAGNOSIS:   Past Medical History  Diagnosis Date  . Hypertension   . Diabetes mellitus without complication   . Anemia   . Coronary artery disease   . CHF (congestive heart failure)   . Glaucoma   . Hyperlipidemia   . Chronic kidney disease   . Neuropathy   . Chronic anemia     HOSPITAL COURSE:   1.melena with possible upper gi bleed: 1 week prior to admission she had an NG DD showing gastritis, she also had an attempted colonoscopy which needed to be rescheduled. There was concern that her hemoglobin had dropped from 8.9-7.4 during the week between admissions. Stool was FOBT positive in the emergency room. Hemoglobin has been fairly stable during this admission at 7.6-7.7. She was transfused 1 u prbc's to 8. There has been no further bleeding noted. She continues on Protonix. She is not on any non-steroidal anti-inflammatory. A follow-up appointment will be scheduled with Dr. Vira Agar. She is tolerating a regular diet with no GI complaints at the time of discharge.  2. Chest pain: Presented with left sided chest pain. Troponins very slightly elevated to 0.04, this is chronic for her. She was seen by cardiology during this hospitalization and no further inpatient workup was planned. She follows with Dr. Ubaldo Glassing in the outpatient setting and is  scheduled for cardiac catheterization in the near future. She will follow up with him in 1 week. At the time of discharge she has no further chest pain   3. DM 2: She became hypoglycemic during the hospitalization. Amaryl was held. By the time of discharge she is again having normal blood sugars. She was given a short course of steroid's on June 5 and sixth with cause her blood sugars to increase at that time. This has been discontinued and she is no longer needing additional insulin. On discharge I have recommended that she not restart Amaryl. She can discuss this with her primary care physician.  4. Acute respiratory failure: Chest x-ray on 6/4 showed pulmonary edema. Her Lasix had been held briefly and were restarted at that time. Aeration has improved. She is no longer needing supplemental oxygen and saturations are in the high 90s. She was started initially on vancomycin and Zosyn for possible hospital-acquired pneumonia, she'll be discharged on Levaquin for an additional 4 days to complete course. She does not show signs of overt pneumonia, no fevers, leukocytosis or coughing.   5. Acute on CKD 3: Renal function has been stable. Creatinine on discharge is 1.95. GFR is 29. We'll need to monitor carefully as she is on Lasix and an ACE inhibitor.  6. Hypertension: Blood pressures stable on home regimen of Lasix, enalapril, labetalol, amlodipine  #7. Memory loss: This was discussed with the patient and her family. It sounds as if she has mild cognitive decline at home. She is  with her husband 24 hours a day. She will have 24-hour supervision for safety.  #8. Coronary artery disease: She follows with Dr. Ubaldo Glassing. Cardiac catheterization scheduled for the near future. Aspirin has been held due to possible GI bleeding at this time.   DISCHARGE CONDITIONS:   Fair  CONSULTS OBTAINED:  Treatment Team:  Teodoro Spray, MD  DRUG ALLERGIES:  No Known Allergies  DISCHARGE MEDICATIONS:   Current  Discharge Medication List    START taking these medications   Details  levofloxacin (LEVAQUIN) 250 MG tablet Take 1 tablet (250 mg total) by mouth daily. Qty: 4 tablet, Refills: 0      CONTINUE these medications which have NOT CHANGED   Details  amLODipine (NORVASC) 10 MG tablet Take 10 mg by mouth daily.    docusate sodium (COLACE) 100 MG capsule Take 100 mg by mouth 2 (two) times daily.    ferrous sulfate 325 (65 FE) MG tablet Take 325 mg by mouth daily.    hydrALAZINE (APRESOLINE) 100 MG tablet Take 100 mg by mouth 3 (three) times daily.     isosorbide dinitrate (ISORDIL) 30 MG tablet Take 30 mg by mouth 2 (two) times daily.     Omega-3 Fatty Acids (FISH OIL PO) Take 1 capsule by mouth daily.    pantoprazole (PROTONIX) 40 MG tablet Take 40 mg by mouth daily.    ramipril (ALTACE) 5 MG capsule Take 5 mg by mouth daily.     simvastatin (ZOCOR) 20 MG tablet Take 20 mg by mouth every evening.     feeding supplement, GLUCERNA SHAKE, (GLUCERNA SHAKE) LIQD Take 237 mLs by mouth 2 (two) times daily between meals. Qty: 60 Can, Refills: 0    furosemide (LASIX) 40 MG tablet Take 1 tablet (40 mg total) by mouth 2 (two) times daily. Qty: 60 tablet, Refills: 0    labetalol (NORMODYNE) 200 MG tablet Take 1 tablet (200 mg total) by mouth 2 (two) times daily. Qty: 60 tablet, Refills: 0    potassium chloride SA (K-DUR,KLOR-CON) 20 MEQ tablet Take 1 tablet (20 mEq total) by mouth daily. Qty: 30 tablet, Refills: 0      STOP taking these medications     aspirin 81 MG tablet      glimepiride (AMARYL) 2 MG tablet      meloxicam (MOBIC) 7.5 MG tablet      oxyCODONE-acetaminophen (PERCOCET/ROXICET) 5-325 MG per tablet      azithromycin (ZITHROMAX) 250 MG tablet      cefUROXime (CEFTIN) 500 MG tablet          DISCHARGE INSTRUCTIONS:    DIET:  Cardiac diet and Diabetic diet  DISCHARGE CONDITION:  Fair  ACTIVITY:  Activity as tolerated 24 hour supervision  OXYGEN:  Home  Oxygen: No.   Oxygen Delivery: room air  DISCHARGE LOCATION:  home with home health physical therapy and nursing  If you experience worsening of your admission symptoms, develop shortness of breath, life threatening emergency, suicidal or homicidal thoughts you must seek medical attention immediately by calling 911 or calling your MD immediately  if symptoms less severe.  You Must read complete instructions/literature along with all the possible adverse reactions/side effects for all the Medicines you take and that have been prescribed to you. Take any new Medicines after you have completely understood and accept all the possible adverse reactions/side effects.   Please note  You were cared for by a hospitalist during your hospital stay. If you have any questions about your  discharge medications or the care you received while you were in the hospital after you are discharged, you can call the unit and asked to speak with the hospitalist on call if the hospitalist that took care of you is not available. Once you are discharged, your primary care physician will handle any further medical issues. Please note that NO REFILLS for any discharge medications will be authorized once you are discharged, as it is imperative that you return to your primary care physician (or establish a relationship with a primary care physician if you do not have one) for your aftercare needs so that they can reassess your need for medications and monitor your lab values.    Today   CHIEF COMPLAINT:   Chief Complaint  Patient presents with  . Chest Pain  . Shortness of Breath    HISTORY OF PRESENT ILLNESS:  Angeleen Horney is a 69 y.o. female with below mentioned past medical history presents to the emergency room with the complaints of episode of left-sided chest pain while she was resting at home. No radiation of pain, no palpitations, no dizziness. Mild associated shortness of breath +. She took one tablet of  sublingual nitroglycerin following which her chest pain resolved. In the emergency room patient was evaluated by the ED physician and was noted to be with stable vital signs, workup revealed EKG normal sinus rhythm with ventricular rate of 74 bpm with no acute ischemic changes, troponin mild elevation of 0.04 which is chronic. Of note she was admitted to [facility] last week with pneumonia, seen cardiologist after discharge and is scheduled for heart catheterization shortly. She is also seen gastroenterologist for chronic weight loss and has undergone EGD last week which showed some gastritis and attempted colonoscopy was incomplete which is rescheduled at this time. Patient was noted to have a drop in hemoglobin and hematocrit compared to last week admission from 8.9-7.4/23, noted to have heme positive stools by the ED physician, hence hospitalist service was consulted for further evaluation and management. Type and screen was done and patient was started on IV Protonix by the ED physician. Patient is comfortably resting in the bed at this time, denies any complaints including no chest pain or shortness of breath, nausea, vomiting, diarrhea, abdominal pain.   VITAL SIGNS:  Blood pressure 152/54, pulse 69, temperature 98.6 F (37 C), temperature source Oral, resp. rate 22, height 5\' 7"  (1.702 m), weight 67.087 kg (147 lb 14.4 oz), SpO2 97 %.  I/O:    Intake/Output Summary (Last 24 hours) at 10/09/14 0941 Last data filed at 10/09/14 0819  Gross per 24 hour  Intake      0 ml  Output    551 ml  Net   -551 ml    PHYSICAL EXAMINATION:   GENERAL: 69 y.o.-year-old patient lying in the bed eating breakfast EYES: Pupils equal, round, reactive to light and accommodation. No scleral icterus. Extraocular muscles intact.  HEENT: Head atraumatic, normocephalic. Oropharynx and nasopharynx clear.  NECK: Supple, no jugular venous distention. No thyroid enlargement, no tenderness.  LUNGS: clear with good air  movement CARDIOVASCULAR: S1, S2 normal. No murmurs, rubs, or gallops.  ABDOMEN: Bowel sounds normal, soft, nondistended, no tenderness no rebound no guarding EXTREMITIES: No pedal edema, cyanosis, or clubbing.  NEUROLOGIC: Cranial nerves II through XII are intact. Muscle strength 5/5 in all extremities. Sensation intact. Gait not checked.  PSYCHIATRIC: The patient is alert and oriented x 3.  SKIN: No obvious rash, lesion, or ulcer.  DATA REVIEW:   CBC  Recent Labs Lab 10/09/14 0518  WBC 5.2  HGB 7.8*  HCT 22.9*  PLT 288    Chemistries   Recent Labs Lab 10/06/14 1924  10/09/14 0518  NA 129*  < > 132*  K 5.9*  < > 4.6  CL 99*  < > 100*  CO2 24  < > 26  GLUCOSE 139*  < > 72  BUN 42*  < > 62*  CREATININE 2.24*  < > 1.95*  CALCIUM 7.6*  < > 7.3*  AST 19  --   --   ALT 10*  --   --   ALKPHOS 46  --   --   BILITOT 0.4  --   --   < > = values in this interval not displayed.  Cardiac Enzymes  Recent Labs Lab 10/03/14 1517  TROPONINI 0.06*    Microbiology Results  Results for orders placed or performed during the hospital encounter of 10/02/14  Urine culture     Status: None   Collection Time: 10/03/14  5:48 PM  Result Value Ref Range Status   Specimen Description URINE, RANDOM  Final   Special Requests NONE  Final   Culture   Final    30,000 COLONIES/ml ENTEROCOCCUS FAECALIS MULTIPLE SPECIES PRESENT    Report Status 10/06/2014 FINAL  Final   Organism ID, Bacteria ENTEROCOCCUS FAECALIS  Final      Susceptibility   Enterococcus faecalis - MIC*    AMPICILLIN <=2 SENSITIVE Sensitive     CIPROFLOXACIN >=8 RESISTANT Resistant     LEVOFLOXACIN >=8 RESISTANT Resistant     TETRACYCLINE >=16 RESISTANT Resistant     LINEZOLID 2 SENSITIVE Sensitive     * 30,000 COLONIES/ml ENTEROCOCCUS FAECALIS  Culture, blood (routine x 2)     Status: None   Collection Time: 10/03/14  7:03 PM  Result Value Ref Range Status   Specimen Description BLOOD  Final   Special  Requests BLOOD  Final   Culture NO GROWTH 5 DAYS  Final   Report Status 10/08/2014 FINAL  Final  Culture, blood (routine x 2)     Status: None   Collection Time: 10/03/14  7:03 PM  Result Value Ref Range Status   Specimen Description BLOOD  Final   Special Requests BLOOD  Final   Culture NO GROWTH 5 DAYS  Final   Report Status 10/08/2014 FINAL  Final    RADIOLOGY:  Dg Chest Port 1 View  10/08/2014   CLINICAL DATA:  Hypoxia, chest pain, shortness of breath.  EXAM: PORTABLE CHEST - 1 VIEW  COMPARISON:  10/06/2014  FINDINGS: There is cardiomegaly. Improving aeration in the lungs with decreasing bilateral effusions and bibasilar opacities. Mild vascular congestion, also improved. No acute airspace scratch head no acute bony abnormality.  IMPRESSION: Improving aeration with decreasing effusions and bibasilar opacities. Decreasing vascular congestion.   Electronically Signed   By: Rolm Baptise M.D.   On: 10/08/2014 11:46    EKG:   Orders placed or performed during the hospital encounter of 10/02/14  . ED EKG (<38mins upon arrival to the ED)  . ED EKG (<50mins upon arrival to the ED)  . EKG 12-Lead  . EKG 12-Lead      Management plans discussed with the patient, family and they are in agreement.  CODE STATUS:     Code Status Orders        Start     Ordered   10/03/14 0356  Full  code   Continuous     10/03/14 0355      TOTAL TIME TAKING CARE OF THIS PATIENT: 50 minutes.    Myrtis Ser M.D on 10/09/2014 at 9:41 AM  Between 7am to 6pm - Pager - [phone]  After 6pm go to [email or web address] - password [email or web address] [facility]  Office  [phone]  CC: Primary care physician; Morton Peters, MD

## 2014-10-09 NOTE — Discharge Instructions (Signed)
°  DIET:  Cardiac diet and Diabetic diet  DISCHARGE CONDITION:  Fair  ACTIVITY:  Activity as tolerated 24 hour supervision  OXYGEN:  Home Oxygen: No.   Oxygen Delivery: room air  DISCHARGE LOCATION:  home with home health  If you experience worsening of your admission symptoms, develop shortness of breath, life threatening emergency, suicidal or homicidal thoughts you must seek medical attention immediately by calling 911 or calling your MD immediately  if symptoms less severe.  You Must read complete instructions/literature along with all the possible adverse reactions/side effects for all the Medicines you take and that have been prescribed to you. Take any new Medicines after you have completely understood and accpet all the possible adverse reactions/side effects.   Please note  You were cared for by a hospitalist during your hospital stay. If you have any questions about your discharge medications or the care you received while you were in the hospital after you are discharged, you can call the unit and asked to speak with the hospitalist on call if the hospitalist that took care of you is not available. Once you are discharged, your primary care physician will handle any further medical issues. Please note that NO REFILLS for any discharge medications will be authorized once you are discharged, as it is imperative that you return to your primary care physician (or establish a relationship with a primary care physician if you do not have one) for your aftercare needs so that they can reassess your need for medications and monitor your lab values.

## 2014-10-09 NOTE — Care Management (Signed)
Patient is for discharge home with resumption of care orders for SN and PT through [facility].  Notified Kristen with [facility].  Son in agreement with plan

## 2014-10-19 LAB — CULTURE, BLOOD (ROUTINE X 2)

## 2014-10-29 ENCOUNTER — Other Ambulatory Visit: Payer: Self-pay

## 2014-11-01 ENCOUNTER — Ambulatory Visit: Payer: Medicare HMO | Admitting: Family

## 2014-11-02 ENCOUNTER — Encounter: Payer: Self-pay | Admitting: Family

## 2014-11-02 ENCOUNTER — Ambulatory Visit: Payer: Medicare HMO | Attending: Family | Admitting: Family

## 2014-11-02 ENCOUNTER — Telehealth: Payer: Self-pay | Admitting: Family

## 2014-11-02 VITALS — BP 113/53 | HR 62 | Resp 20 | Ht 67.0 in | Wt 146.0 lb

## 2014-11-02 DIAGNOSIS — E119 Type 2 diabetes mellitus without complications: Secondary | ICD-10-CM | POA: Insufficient documentation

## 2014-11-02 DIAGNOSIS — I509 Heart failure, unspecified: Secondary | ICD-10-CM | POA: Diagnosis present

## 2014-11-02 DIAGNOSIS — H409 Unspecified glaucoma: Secondary | ICD-10-CM | POA: Insufficient documentation

## 2014-11-02 DIAGNOSIS — I2510 Atherosclerotic heart disease of native coronary artery without angina pectoris: Secondary | ICD-10-CM | POA: Diagnosis not present

## 2014-11-02 DIAGNOSIS — Z79899 Other long term (current) drug therapy: Secondary | ICD-10-CM | POA: Insufficient documentation

## 2014-11-02 DIAGNOSIS — I5032 Chronic diastolic (congestive) heart failure: Secondary | ICD-10-CM | POA: Insufficient documentation

## 2014-11-02 DIAGNOSIS — G629 Polyneuropathy, unspecified: Secondary | ICD-10-CM | POA: Diagnosis not present

## 2014-11-02 DIAGNOSIS — E785 Hyperlipidemia, unspecified: Secondary | ICD-10-CM | POA: Insufficient documentation

## 2014-11-02 DIAGNOSIS — Z87891 Personal history of nicotine dependence: Secondary | ICD-10-CM | POA: Diagnosis not present

## 2014-11-02 DIAGNOSIS — R5383 Other fatigue: Secondary | ICD-10-CM | POA: Insufficient documentation

## 2014-11-02 DIAGNOSIS — I10 Essential (primary) hypertension: Secondary | ICD-10-CM

## 2014-11-02 DIAGNOSIS — I129 Hypertensive chronic kidney disease with stage 1 through stage 4 chronic kidney disease, or unspecified chronic kidney disease: Secondary | ICD-10-CM | POA: Insufficient documentation

## 2014-11-02 DIAGNOSIS — D649 Anemia, unspecified: Secondary | ICD-10-CM | POA: Insufficient documentation

## 2014-11-02 MED ORDER — POTASSIUM CHLORIDE CRYS ER 20 MEQ PO TBCR: 20.0000 meq | EXTENDED_RELEASE_TABLET | Freq: Every day | ORAL | Status: AC

## 2014-11-02 MED ORDER — FUROSEMIDE 40 MG PO TABS: 40.0000 mg | ORAL_TABLET | Freq: Two times a day (BID) | ORAL | Status: DC

## 2014-11-02 NOTE — Progress Notes (Signed)
Subjective:    Patient ID: [name], female    DOB: [birth year], 69 y.o.   MRN: [id number]  Congestive Heart Failure Presents for follow-up visit. The disease course has been stable. Associated symptoms include edema, fatigue (wake up feeling tired) and shortness of breath. Pertinent negatives include no abdominal pain, chest pain, orthopnea or paroxysmal nocturnal dyspnea. The symptoms have been stable. Past treatments include ACE inhibitors, beta blockers and salt and fluid restriction. The treatment provided mild relief. Compliance with prior treatments has been good. Her past medical history is significant for anemia, CAD, DM and HTN.  Other This is a chronic (fatigue) problem. The current episode started more than 1 year ago. The problem occurs daily. The problem has been unchanged. Associated symptoms include coughing (dry cough), fatigue (wake up feeling tired), headaches (just today) and neck pain. Pertinent negatives include no abdominal pain, chest pain, nausea, numbness, sore throat or weakness. The symptoms are aggravated by exertion. She has tried nothing for the symptoms.    Past Medical History  Diagnosis Date  . Hypertension   . Diabetes mellitus without complication   . Anemia   . Coronary artery disease   . CHF (congestive heart failure)   . Glaucoma   . Hyperlipidemia   . Chronic kidney disease   . Neuropathy   . Chronic anemia     Past Surgical History  Procedure Laterality Date  . Cardiac catheterization    . Esophagogastroduodenoscopy (egd) with propofol N/A 09/24/2014    Elliott-gastritis & normal Billroth II changes   . Colonoscopy with propofol N/A 09/24/2014    Elliott-Incomplete secondary to prep  . Coronary angioplasty  2016    stent placed  . Bilroth ii procedure       History  Substance Use Topics  . Smoking status: Former Research scientist (life sciences)  . Smokeless tobacco: Never Used     Comment: quit many years ago  . Alcohol Use: No    No Known  Allergies   Prior to Admission medications   Medication Sig Start Date End Date Taking? Authorizing Provider  docusate sodium (COLACE) 100 MG capsule Take 100 mg by mouth 2 (two) times daily.   Yes Historical Provider, MD  feeding supplement, GLUCERNA SHAKE, (GLUCERNA SHAKE) LIQD Take 237 mLs by mouth 2 (two) times daily between meals. 09/20/14  Yes Loletha Grayer, MD  ferrous sulfate 325 (65 FE) MG tablet Take 325 mg by mouth daily.   Yes Historical Provider, MD  furosemide (LASIX) 40 MG tablet Take 1 tablet (40 mg total) by mouth 2 (two) times daily. 11/02/14  Yes Alisa Graff, FNP  glimepiride (AMARYL) 2 MG tablet Take 1 mg by mouth daily with breakfast.   Yes Historical Provider, MD  hydrALAZINE (APRESOLINE) 100 MG tablet Take 50 mg by mouth 3 (three) times daily.    Yes Historical Provider, MD  isosorbide dinitrate (ISORDIL) 30 MG tablet Take 30 mg by mouth 2 (two) times daily.    Yes Historical Provider, MD  Omega-3 Fatty Acids (FISH OIL PO) Take 1 capsule by mouth daily.   Yes Historical Provider, MD  omeprazole (PRILOSEC) 20 MG capsule Take 20 mg by mouth daily.   Yes Historical Provider, MD  potassium chloride SA (K-DUR,KLOR-CON) 20 MEQ tablet Take 1 tablet (20 mEq total) by mouth daily. 11/02/14  Yes Alisa Graff, FNP  propranolol (INDERAL) 20 MG tablet Take 40 mg by mouth 3 (three) times daily.   Yes Historical Provider, MD  ramipril (ALTACE)  5 MG capsule Take 5 mg by mouth daily.    Yes Historical Provider, MD  simvastatin (ZOCOR) 20 MG tablet Take 20 mg by mouth every evening.    Yes Historical Provider, MD     Review of Systems  Constitutional: Positive for fatigue (wake up feeling tired). Negative for appetite change.  HENT: Negative for sore throat and trouble swallowing.   Eyes: Negative.   Respiratory: Positive for cough (dry cough) and shortness of breath. Negative for chest tightness and wheezing.   Cardiovascular: Positive for leg swelling. Negative for chest pain.   Gastrointestinal: Negative.  Negative for nausea, abdominal pain, blood in stool and abdominal distention.  Endocrine: Negative.   Genitourinary: Negative.   Musculoskeletal: Positive for back pain and neck pain.  Skin: Negative.   Allergic/Immunologic: Negative.   Neurological: Positive for headaches (just today). Negative for dizziness, weakness and numbness.  Hematological: Negative for adenopathy. Does not bruise/bleed easily.  Psychiatric/Behavioral: Negative for sleep disturbance (sleeps on 2 pillows). The patient is not nervous/anxious.        Objective:   Physical Exam  Constitutional: She is oriented to person, place, and time. She appears well-developed and well-nourished.  HENT:  Head: Normocephalic and atraumatic.  Eyes: Conjunctivae are normal. Pupils are equal, round, and reactive to light.  Neck: Normal range of motion. Neck supple.  Cardiovascular: Normal rate and regular rhythm.   Pulmonary/Chest: Effort normal and breath sounds normal. She has no wheezes. She has no rales.  Abdominal: Soft. She exhibits no distension. There is no tenderness.  Musculoskeletal: She exhibits edema (1+ pitting in bilateral ankles). She exhibits no tenderness.  Neurological: She is alert and oriented to person, place, and time.  Skin: Skin is warm and dry.  Psychiatric: She has a normal mood and affect. Her behavior is normal.  Nursing note and vitals reviewed.   BP 113/53 mmHg  Pulse 62  Resp 20  Ht 5\' 7"  (1.702 m)  Wt 146 lb (66.225 kg)  BMI 22.86 kg/m2  SpO2 96%        Assessment & Plan:  1: Chronic heart failure with preserved ejection fraction- Patient presents with continued shortness of breath and fatigue with exertion. She said that she was a little short of breath upon walking into the office today but after she sits down for a few minutes, her breathing returns to baseline. She does feel like the fatigue is a little worse and upon review of her medications, she is  taking both labetalol and propranolol. Per her cardiologist's last note on 10/12/14, she was to stop the labetalol because propranolol was being started. Certainly being on 2 beta blockers could be contributing to her fatigue. Written information provided to patient about stopping the labetalol. She has been weighing daily and has lost some weight since she was here last. She feels like her appetite is returning and that she is now eating better. Reminded her to monitor her sodium content carefully. She says that she doesn't add any salt to her food. Does have support stockings at home but admits to not wearing them much. Discussed the importance of wearing them daily with removal at bedtime. Also encouraged her to continue to elevate her legs when at home.  2: HTN- Blood pressure looks good today. 3: Diabetes- She says that her glucose this morning was 83. Follows with her PCP regarding this. 4: Fatigue- Concerned about possible sleep apnea as patient says that her husband tells her that she snores. She also  has woken herself up snorting and says that she wakes up feeling just as tired as when she went to bed. Will order a sleep study to rule out sleep apnea. Patient is agreeable to this.  Return in 3 months or sooner for any questions/problems before the next office visit.

## 2014-11-02 NOTE — Patient Instructions (Signed)
Stop Labetalol due to patient being on propranolol.   Sent in refills for furosemide and potassium (same dosage/directions).  Continue weighing daily and call for an overnight weight gain of >2 pounds or a weekly weight gain of >5pounds.  Wear support stockings daily with removing them at bedtime. Elevate legs when at home.

## 2014-11-02 NOTE — Telephone Encounter (Signed)
Patient came to wrong clinic directed with volunteer to [facility].

## 2014-11-12 ENCOUNTER — Ambulatory Visit
Admission: AD | Admit: 2014-11-12 | Discharge: 2014-11-12 | Disposition: A | Discharge status: Home / Self Care | Payer: Medicare HMO | Source: Ambulatory Visit | Attending: Unknown Physician Specialty | Admitting: Unknown Physician Specialty

## 2014-11-12 ENCOUNTER — Encounter: Admission: RE | Payer: Self-pay | Source: Ambulatory Visit

## 2014-11-12 ENCOUNTER — Encounter
Admission: AD | Disposition: A | Discharge status: Home / Self Care | Payer: Self-pay | Source: Ambulatory Visit | Attending: Unknown Physician Specialty

## 2014-11-12 ENCOUNTER — Ambulatory Visit: Payer: Medicare HMO | Admitting: Anesthesiology

## 2014-11-12 ENCOUNTER — Ambulatory Visit
Admission: RE | Admit: 2014-11-12 | Payer: Medicare HMO | Source: Ambulatory Visit | Admitting: Unknown Physician Specialty

## 2014-11-12 ENCOUNTER — Encounter: Payer: Self-pay | Admitting: *Deleted

## 2014-11-12 DIAGNOSIS — K640 First degree hemorrhoids: Secondary | ICD-10-CM | POA: Diagnosis not present

## 2014-11-12 DIAGNOSIS — I129 Hypertensive chronic kidney disease with stage 1 through stage 4 chronic kidney disease, or unspecified chronic kidney disease: Secondary | ICD-10-CM | POA: Insufficient documentation

## 2014-11-12 DIAGNOSIS — Z955 Presence of coronary angioplasty implant and graft: Secondary | ICD-10-CM | POA: Diagnosis not present

## 2014-11-12 DIAGNOSIS — Z87891 Personal history of nicotine dependence: Secondary | ICD-10-CM | POA: Insufficient documentation

## 2014-11-12 DIAGNOSIS — Z86010 Personal history of colonic polyps: Secondary | ICD-10-CM | POA: Diagnosis not present

## 2014-11-12 DIAGNOSIS — E785 Hyperlipidemia, unspecified: Secondary | ICD-10-CM | POA: Insufficient documentation

## 2014-11-12 DIAGNOSIS — Z79899 Other long term (current) drug therapy: Secondary | ICD-10-CM | POA: Insufficient documentation

## 2014-11-12 DIAGNOSIS — I509 Heart failure, unspecified: Secondary | ICD-10-CM | POA: Insufficient documentation

## 2014-11-12 DIAGNOSIS — K644 Residual hemorrhoidal skin tags: Secondary | ICD-10-CM | POA: Diagnosis not present

## 2014-11-12 DIAGNOSIS — D509 Iron deficiency anemia, unspecified: Secondary | ICD-10-CM | POA: Insufficient documentation

## 2014-11-12 DIAGNOSIS — N189 Chronic kidney disease, unspecified: Secondary | ICD-10-CM | POA: Diagnosis not present

## 2014-11-12 DIAGNOSIS — E1122 Type 2 diabetes mellitus with diabetic chronic kidney disease: Secondary | ICD-10-CM | POA: Diagnosis not present

## 2014-11-12 DIAGNOSIS — I2510 Atherosclerotic heart disease of native coronary artery without angina pectoris: Secondary | ICD-10-CM | POA: Diagnosis not present

## 2014-11-12 DIAGNOSIS — E1140 Type 2 diabetes mellitus with diabetic neuropathy, unspecified: Secondary | ICD-10-CM | POA: Diagnosis not present

## 2014-11-12 HISTORY — PX: COLONOSCOPY WITH PROPOFOL: SHX5780

## 2014-11-12 LAB — GLUCOSE, CAPILLARY: GLUCOSE-CAPILLARY: 86 mg/dL (ref 65–99)

## 2014-11-12 SURGERY — COLONOSCOPY WITH PROPOFOL
Anesthesia: Monitor Anesthesia Care

## 2014-11-12 SURGERY — COLONOSCOPY WITH PROPOFOL
Anesthesia: General

## 2014-11-12 MED ORDER — FENTANYL CITRATE (PF) 100 MCG/2ML IJ SOLN
INTRAMUSCULAR | Status: DC | PRN
  Administered 2014-11-12: 50 ug via INTRAVENOUS

## 2014-11-12 MED ORDER — SODIUM CHLORIDE 0.9 % IV SOLN
INTRAVENOUS | Status: DC
  Administered 2014-11-12: 13:00:00 via INTRAVENOUS

## 2014-11-12 MED ORDER — MIDAZOLAM HCL 2 MG/2ML IJ SOLN
INTRAMUSCULAR | Status: DC | PRN
  Administered 2014-11-12: 1 mg via INTRAVENOUS

## 2014-11-12 MED ORDER — PROPOFOL INFUSION 10 MG/ML OPTIME
INTRAVENOUS | Status: DC | PRN
  Administered 2014-11-12: 120 ug/kg/min via INTRAVENOUS

## 2014-11-12 MED ORDER — SODIUM CHLORIDE 0.9 % IV SOLN: INTRAVENOUS | Status: DC

## 2014-11-12 NOTE — Transfer of Care (Signed)
Immediate Anesthesia Transfer of Care Note  Patient: MCKENZEE BEEM  Procedure(s) Performed: Procedure(s): COLONOSCOPY WITH PROPOFOL (N/A)  Patient Location: PACU  Anesthesia Type:General  Level of Consciousness: awake and sedated  Airway & Oxygen Therapy: Patient Spontanous Breathing and Patient connected to nasal cannula oxygen  Post-op Assessment: Report given to RN and Post -op Vital signs reviewed and stable  Post vital signs: Reviewed and stable  Last Vitals:  Filed Vitals:   11/12/14 1301  BP: 175/89  Pulse: 79  Temp: 36.5 C  Resp: 20    Complications: No apparent anesthesia complications

## 2014-11-12 NOTE — Anesthesia Postprocedure Evaluation (Signed)
  Anesthesia Post-op Note  Patient: [name]  Procedure(s) Performed: Procedure(s): COLONOSCOPY WITH PROPOFOL (N/A)  Anesthesia type:General  Patient location: PACU  Post pain: Pain level controlled  Post assessment: Post-op Vital signs reviewed, Patient's Cardiovascular Status Stable, Respiratory Function Stable, Patent Airway and No signs of Nausea or vomiting  Post vital signs: Reviewed and stable  Last Vitals:  Filed Vitals:   11/12/14 1430  BP: 172/56  Pulse: 57  Temp:   Resp: 17    Level of consciousness: awake, alert  and patient cooperative  Complications: No apparent anesthesia complications

## 2014-11-12 NOTE — H&P (Signed)
Primary Care Physician:  Krystal Peters, MD Primary Gastroenterologist:  Dr. Vira Marsh  Pre-Procedure History & Physical: HPI:  [name] is a 69 y.o. female is here for an colonoscopy.   Past Medical History  Diagnosis Date  . Hypertension   . Diabetes mellitus without complication   . Anemia   . Coronary artery disease   . CHF (congestive heart failure)   . Glaucoma   . Hyperlipidemia   . Chronic kidney disease   . Neuropathy   . Chronic anemia     Past Surgical History  Procedure Laterality Date  . Cardiac catheterization    . Esophagogastroduodenoscopy (egd) with propofol N/A 09/24/2014    [name]-gastritis & normal Billroth II changes   . Colonoscopy with propofol N/A 09/24/2014    [name]-Incomplete secondary to prep  . Coronary angioplasty  2016    stent placed  . Bilroth ii procedure      Prior to Admission medications   Medication Sig Start Date End Date Taking? Authorizing Provider  docusate sodium (COLACE) 100 MG capsule Take 100 mg by mouth 2 (two) times daily.    Historical Provider, MD  feeding supplement, GLUCERNA SHAKE, (GLUCERNA SHAKE) LIQD Take 237 mLs by mouth 2 (two) times daily between meals. 09/20/14   Krystal Grayer, MD  ferrous sulfate 325 (65 FE) MG tablet Take 325 mg by mouth daily.    Historical Provider, MD  furosemide (LASIX) 40 MG tablet Take 1 tablet (40 mg total) by mouth 2 (two) times daily. 11/02/14   Krystal Graff, FNP  glimepiride (AMARYL) 2 MG tablet Take 1 mg by mouth daily with breakfast.    Historical Provider, MD  hydrALAZINE (APRESOLINE) 100 MG tablet Take 50 mg by mouth 3 (three) times daily.     Historical Provider, MD  isosorbide dinitrate (ISORDIL) 30 MG tablet Take 30 mg by mouth 2 (two) times daily.     Historical Provider, MD  Omega-3 Fatty Acids (FISH OIL PO) Take 1 capsule by mouth daily.    Historical Provider, MD  omeprazole (PRILOSEC) 20 MG capsule Take 20 mg by mouth daily.    Historical Provider, MD   potassium chloride SA (K-DUR,KLOR-CON) 20 MEQ tablet Take 1 tablet (20 mEq total) by mouth daily. 11/02/14   Krystal Graff, FNP  propranolol (INDERAL) 20 MG tablet Take 40 mg by mouth 3 (three) times daily.    Historical Provider, MD  ramipril (ALTACE) 5 MG capsule Take 5 mg by mouth daily.     Historical Provider, MD  simvastatin (ZOCOR) 20 MG tablet Take 20 mg by mouth every evening.     Historical Provider, MD    Allergies as of 11/12/2014 - Review Complete 11/12/2014  Allergen Reaction Noted  . Codeine      Family History  Problem Relation Age of Onset  . Diabetes Mother   . Heart disease Father   . Diabetes Sister   . Lung cancer Brother   . Cancer Brother   . Diabetes Brother   . Diabetes Sister     History   Social History  . Marital Status: Married    Spouse Name: N/A  . Number of Children: 2  . Years of Education: N/A   Occupational History  . retired Actuary     Social History Main Topics  . Smoking status: Former Research scientist (life sciences)  . Smokeless tobacco: Never Used     Comment: quit many years ago  . Alcohol Use: No  .  Drug Use: No  . Sexual Activity: Yes    Birth Control/ Protection: Post-menopausal   Other Topics Concern  . Not on file   Social History Narrative   Lives with her husband    Review of Systems: See HPI, otherwise negative ROS  Physical Exam: BP 175/89 mmHg  Pulse 79  Temp(Src) 97.7 F (36.5 C) (Tympanic)  Resp 20  SpO2 100% General:   Alert,  pleasant and cooperative in NAD Head:  Normocephalic and atraumatic. Neck:  Supple; no masses or thyromegaly. Lungs:  Clear throughout to auscultation.    Heart:  Regular rate and rhythm. Abdomen:  Soft, nontender and nondistended. Normal bowel sounds, without guarding, and without rebound.   Neurologic:  Alert and  oriented x4;  grossly normal neurologically.  Impression/Plan: [name] is here for an colonoscopy to be performed for anemia and personal hx of colon polyps  Risks,  benefits, limitations, and alternatives regarding  colonoscopy have been reviewed with the patient.  Questions have been answered.  All parties agreeable.   Krystal Cheers, MD  11/12/2014, 1:48 PM   Primary Care Physician:  Krystal Peters, MD Primary Gastroenterologist:  Dr. Vira Marsh  Pre-Procedure History & Physical: HPI:  [name] is a 69 y.o. female is here for an colonoscopy.   Past Medical History  Diagnosis Date  . Hypertension   . Diabetes mellitus without complication   . Anemia   . Coronary artery disease   . CHF (congestive heart failure)   . Glaucoma   . Hyperlipidemia   . Chronic kidney disease   . Neuropathy   . Chronic anemia     Past Surgical History  Procedure Laterality Date  . Cardiac catheterization    . Esophagogastroduodenoscopy (egd) with propofol N/A 09/24/2014    [name]-gastritis & normal Billroth II changes   . Colonoscopy with propofol N/A 09/24/2014    [name]-Incomplete secondary to prep  . Coronary angioplasty  2016    stent placed  . Bilroth ii procedure      Prior to Admission medications   Medication Sig Start Date End Date Taking? Authorizing Provider  docusate sodium (COLACE) 100 MG capsule Take 100 mg by mouth 2 (two) times daily.    Historical Provider, MD  feeding supplement, GLUCERNA SHAKE, (GLUCERNA SHAKE) LIQD Take 237 mLs by mouth 2 (two) times daily between meals. 09/20/14   Krystal Grayer, MD  ferrous sulfate 325 (65 FE) MG tablet Take 325 mg by mouth daily.    Historical Provider, MD  furosemide (LASIX) 40 MG tablet Take 1 tablet (40 mg total) by mouth 2 (two) times daily. 11/02/14   Krystal Graff, FNP  glimepiride (AMARYL) 2 MG tablet Take 1 mg by mouth daily with breakfast.    Historical Provider, MD  hydrALAZINE (APRESOLINE) 100 MG tablet Take 50 mg by mouth 3 (three) times daily.     Historical Provider, MD  isosorbide dinitrate (ISORDIL) 30 MG tablet Take 30 mg by mouth 2 (two) times daily.     Historical  Provider, MD  Omega-3 Fatty Acids (FISH OIL PO) Take 1 capsule by mouth daily.    Historical Provider, MD  omeprazole (PRILOSEC) 20 MG capsule Take 20 mg by mouth daily.    Historical Provider, MD  potassium chloride SA (K-DUR,KLOR-CON) 20 MEQ tablet Take 1 tablet (20 mEq total) by mouth daily. 11/02/14   Krystal Graff, FNP  propranolol (INDERAL) 20 MG tablet Take 40 mg by mouth 3 (three) times  daily.    Historical Provider, MD  ramipril (ALTACE) 5 MG capsule Take 5 mg by mouth daily.     Historical Provider, MD  simvastatin (ZOCOR) 20 MG tablet Take 20 mg by mouth every evening.     Historical Provider, MD    Allergies as of 11/12/2014 - Review Complete 11/12/2014  Allergen Reaction Noted  . Codeine      Family History  Problem Relation Age of Onset  . Diabetes Mother   . Heart disease Father   . Diabetes Sister   . Lung cancer Brother   . Cancer Brother   . Diabetes Brother   . Diabetes Sister     History   Social History  . Marital Status: Married    Spouse Name: N/A  . Number of Children: 2  . Years of Education: N/A   Occupational History  . retired Actuary     Social History Main Topics  . Smoking status: Former Research scientist (life sciences)  . Smokeless tobacco: Never Used     Comment: quit many years ago  . Alcohol Use: No  . Drug Use: No  . Sexual Activity: Yes    Birth Control/ Protection: Post-menopausal   Other Topics Concern  . Not on file   Social History Narrative   Lives with her husband    Review of Systems: See HPI, otherwise negative ROS  Physical Exam: BP 175/89 mmHg  Pulse 79  Temp(Src) 97.7 F (36.5 C) (Tympanic)  Resp 20  SpO2 100% General:   Alert,  pleasant and cooperative in NAD Head:  Normocephalic and atraumatic. Neck:  Supple; no masses or thyromegaly. Lungs:  Clear throughout to auscultation.    Heart:  Regular rate and rhythm. Abdomen:  Soft, nontender and nondistended. Normal bowel sounds, without guarding, and without rebound.    Neurologic:  Alert and  oriented x4;  grossly normal neurologically.  Impression/Plan: [name] is here for an colonoscopy to be performed for personal hx of colon poly[ps and anemia  Risks, benefits, limitations, and alternatives regarding  colonoscopy have been reviewed with the patient.  Questions have been answered.  All parties agreeable.   Krystal Cheers, MD  11/12/2014, 1:48 PM

## 2014-11-12 NOTE — Op Note (Signed)
[facility] Patient Name: [name] Procedure Date: 11/12/2014 1:27 PM MRN: [id number] Account #: [id number] Date of Birth: 05-15-1945 Admit Type: Outpatient Age: 69 Room: [facility] ENDO ROOM 1 Gender: Female Note Status: Finalized Procedure:         Colonoscopy Indications:       Iron deficiency anemia, Personal history of colonic polyps Providers:         Manya Silvas, MD Referring MD:      Ronald Lobo, MD (Referring MD) Medicines:         Propofol per Anesthesia Procedure:         Pre-Anesthesia Assessment:                    - After reviewing the risks and benefits, the patient was                     deemed in satisfactory condition to undergo the procedure.                    After obtaining informed consent, the colonoscope was                     passed under direct vision. Throughout the procedure, the                     patient's blood pressure, pulse, and oxygen saturations                     were monitored continuously. The Colonoscope was                     introduced through the anus and advanced to the the cecum,                     identified by appendiceal orifice and ileocecal valve. The                     colonoscopy was somewhat difficult due to significant                     looping. Successful completion of the procedure was aided                     by applying abdominal pressure. The patient tolerated the                     procedure well. The quality of the bowel preparation was                     excellent. Findings:      External and internal hemorrhoids were found during endoscopy. The       hemorrhoids were small and Grade I (internal hemorrhoids that do not       prolapse). A small one had clot in it.      The exam was otherwise without abnormality.      The entire examined colon appeared normal. Impression:        - External and internal hemorrhoids.                    - The examination was  otherwise normal.                    - The entire examined colon is normal.                    -  No specimens collected. Recommendation:    - The findings and recommendations were discussed with the                     patient. Need to do capsule study of tthe small bowel due                     to anemia and negative findings. Also sit in tub of warm                     water to soak hemorrhoid. Manya Silvas, MD 11/12/2014 2:14:08 PM This report has been signed electronically. Number of Addenda: 0 Note Initiated On: 11/12/2014 1:27 PM Scope Withdrawal Time: 0 hours 8 minutes 43 seconds  Total Procedure Duration: 0 hours 17 minutes 17 seconds       [facility]

## 2014-11-12 NOTE — Anesthesia Preprocedure Evaluation (Signed)
Anesthesia Evaluation  Patient identified by MRN, date of birth, ID band  Reviewed: Allergy & Precautions, NPO status , Patient's Chart, lab work & pertinent test results  Airway Mallampati: II       Dental  (+) Partial Upper, Partial Lower   Pulmonary former smoker,    + decreased breath sounds      Cardiovascular hypertension, Pt. on medications and Pt. on home beta blockers + CAD and +CHF Rhythm:Regular Rate:Normal     Neuro/Psych negative neurological ROS  negative psych ROS   GI/Hepatic negative GI ROS, Neg liver ROS,   Endo/Other  diabetes, Well Controlled, Type 2, Oral Hypoglycemic Agents  Renal/GU Renal disease  negative genitourinary   Musculoskeletal   Abdominal   Peds negative pediatric ROS (+)  Hematology  (+) anemia ,   Anesthesia Other Findings   Reproductive/Obstetrics                             Anesthesia Physical Anesthesia Plan  ASA: III  Anesthesia Plan: General   Post-op Pain Management:    Induction: Intravenous  Airway Management Planned: Nasal Cannula  Additional Equipment:   Intra-op Plan:   Post-operative Plan:   Informed Consent: I have reviewed the patients History and Physical, chart, labs and discussed the procedure including the risks, benefits and alternatives for the proposed anesthesia with the patient or authorized representative who has indicated his/her understanding and acceptance.     Plan Discussed with: CRNA  Anesthesia Plan Comments:         Anesthesia Quick Evaluation

## 2014-11-13 ENCOUNTER — Encounter: Payer: Self-pay | Admitting: Unknown Physician Specialty

## 2014-11-28 ENCOUNTER — Ambulatory Visit: Payer: Medicare HMO | Attending: Specialist

## 2014-11-28 DIAGNOSIS — I2510 Atherosclerotic heart disease of native coronary artery without angina pectoris: Secondary | ICD-10-CM | POA: Insufficient documentation

## 2014-11-28 DIAGNOSIS — I10 Essential (primary) hypertension: Secondary | ICD-10-CM | POA: Diagnosis not present

## 2014-11-28 DIAGNOSIS — E119 Type 2 diabetes mellitus without complications: Secondary | ICD-10-CM | POA: Insufficient documentation

## 2014-11-28 DIAGNOSIS — R0683 Snoring: Secondary | ICD-10-CM | POA: Diagnosis present

## 2014-11-28 DIAGNOSIS — N189 Chronic kidney disease, unspecified: Secondary | ICD-10-CM | POA: Insufficient documentation

## 2014-11-28 DIAGNOSIS — R5383 Other fatigue: Secondary | ICD-10-CM | POA: Insufficient documentation

## 2014-11-28 DIAGNOSIS — I509 Heart failure, unspecified: Secondary | ICD-10-CM | POA: Insufficient documentation

## 2014-11-28 DIAGNOSIS — D649 Anemia, unspecified: Secondary | ICD-10-CM | POA: Insufficient documentation

## 2014-11-30 ENCOUNTER — Emergency Department: Payer: Medicare HMO

## 2014-11-30 ENCOUNTER — Emergency Department
Admission: EM | Admit: 2014-11-30 | Discharge: 2014-12-01 | Disposition: A | Discharge status: Home / Self Care | Payer: Medicare HMO | Attending: Emergency Medicine | Admitting: Emergency Medicine

## 2014-11-30 ENCOUNTER — Encounter: Payer: Self-pay | Admitting: Emergency Medicine

## 2014-11-30 DIAGNOSIS — N189 Chronic kidney disease, unspecified: Secondary | ICD-10-CM | POA: Insufficient documentation

## 2014-11-30 DIAGNOSIS — Z79811 Long term (current) use of aromatase inhibitors: Secondary | ICD-10-CM | POA: Insufficient documentation

## 2014-11-30 DIAGNOSIS — R2241 Localized swelling, mass and lump, right lower limb: Secondary | ICD-10-CM | POA: Diagnosis not present

## 2014-11-30 DIAGNOSIS — Z79899 Other long term (current) drug therapy: Secondary | ICD-10-CM | POA: Insufficient documentation

## 2014-11-30 DIAGNOSIS — L03115 Cellulitis of right lower limb: Secondary | ICD-10-CM | POA: Diagnosis not present

## 2014-11-30 DIAGNOSIS — I129 Hypertensive chronic kidney disease with stage 1 through stage 4 chronic kidney disease, or unspecified chronic kidney disease: Secondary | ICD-10-CM | POA: Diagnosis not present

## 2014-11-30 DIAGNOSIS — M7989 Other specified soft tissue disorders: Secondary | ICD-10-CM

## 2014-11-30 DIAGNOSIS — Z87891 Personal history of nicotine dependence: Secondary | ICD-10-CM | POA: Insufficient documentation

## 2014-11-30 DIAGNOSIS — L97519 Non-pressure chronic ulcer of other part of right foot with unspecified severity: Secondary | ICD-10-CM | POA: Diagnosis not present

## 2014-11-30 DIAGNOSIS — E119 Type 2 diabetes mellitus without complications: Secondary | ICD-10-CM | POA: Insufficient documentation

## 2014-11-30 LAB — CBC WITH DIFFERENTIAL/PLATELET
Basophils Absolute: 0 10*3/uL (ref 0–0.1)
Basophils Relative: 0 %
EOS PCT: 2 %
Eosinophils Absolute: 0.1 10*3/uL (ref 0–0.7)
HEMATOCRIT: 28.5 % — AB (ref 35.0–47.0)
Hemoglobin: 9.3 g/dL — ABNORMAL LOW (ref 12.0–16.0)
LYMPHS ABS: 0.9 10*3/uL — AB (ref 1.0–3.6)
Lymphocytes Relative: 15 %
MCH: 27.4 pg (ref 26.0–34.0)
MCHC: 32.4 g/dL (ref 32.0–36.0)
MCV: 84.5 fL (ref 80.0–100.0)
MONO ABS: 0.3 10*3/uL (ref 0.2–0.9)
Monocytes Relative: 4 %
NEUTROS ABS: 4.8 10*3/uL (ref 1.4–6.5)
Neutrophils Relative %: 79 %
Platelets: 272 10*3/uL (ref 150–440)
RBC: 3.38 MIL/uL — AB (ref 3.80–5.20)
RDW: 18 % — ABNORMAL HIGH (ref 11.5–14.5)
WBC: 6.1 10*3/uL (ref 3.6–11.0)

## 2014-11-30 LAB — BASIC METABOLIC PANEL
Anion gap: 6 (ref 5–15)
BUN: 26 mg/dL — ABNORMAL HIGH (ref 6–20)
CO2: 21 mmol/L — ABNORMAL LOW (ref 22–32)
Calcium: 8 mg/dL — ABNORMAL LOW (ref 8.9–10.3)
Chloride: 108 mmol/L (ref 101–111)
Creatinine, Ser: 1.28 mg/dL — ABNORMAL HIGH (ref 0.44–1.00)
GFR calc Af Amer: 48 mL/min — ABNORMAL LOW (ref >60)
GFR calc non Af Amer: 42 mL/min — ABNORMAL LOW (ref >60)
Glucose, Bld: 136 mg/dL — ABNORMAL HIGH (ref 65–99)
POTASSIUM: 4.1 mmol/L (ref 3.5–5.1)
Sodium: 135 mmol/L (ref 135–145)

## 2014-11-30 LAB — URINALYSIS COMPLETE WITH MICROSCOPIC (ARMC ONLY)
Bilirubin Urine: NEGATIVE
Glucose, UA: NEGATIVE mg/dL
KETONES UR: NEGATIVE mg/dL
Leukocytes, UA: NEGATIVE
NITRITE: NEGATIVE
PROTEIN: 100 mg/dL — AB
Specific Gravity, Urine: 1.017 (ref 1.005–1.030)
pH: 5 (ref 5.0–8.0)

## 2014-11-30 LAB — BRAIN NATRIURETIC PEPTIDE: B Natriuretic Peptide: 2031 pg/mL — ABNORMAL HIGH (ref 0.0–100.0)

## 2014-11-30 MED ORDER — CLINDAMYCIN PHOSPHATE 600 MG/50ML IV SOLN
600.0000 mg | Freq: Once | INTRAVENOUS | Status: AC
  Administered 2014-12-01: 600 mg via INTRAVENOUS
  Filled 2014-11-30: qty 50

## 2014-11-30 MED ORDER — CIPROFLOXACIN IN D5W 400 MG/200ML IV SOLN
400.0000 mg | Freq: Once | INTRAVENOUS | Status: AC
  Administered 2014-12-01: 400 mg via INTRAVENOUS
  Filled 2014-11-30: qty 200

## 2014-11-30 NOTE — ED Provider Notes (Signed)
-----------------------------------------   9:38 PM on 11/30/2014 -----------------------------------------  ED ECG REPORT I, Joanne Gavel, the attending physician, personally viewed and interpreted this ECG.   Date: 11/30/2014  EKG Time: 21:23  Rate: 82  Rhythm: normal sinus rhythm with sinus arrhythmia  Axis: Left  Intervals:normal  ST&T Change: No acute ST segment elevation   Joanne Gavel, MD 11/30/14 2139

## 2014-11-30 NOTE — ED Notes (Signed)
Pt reports leg swelling that started a week ago.  Able to ambulate. No c/o pain.  Sitting in wheelchair.

## 2014-11-30 NOTE — ED Notes (Signed)
Quarter sized diabetic foot ulcer noted on plantar aspect of right food  Under big toe.  Moderate amount of purulent drainage noted.

## 2014-11-30 NOTE — ED Notes (Signed)
Pt moved to room 8 from room 41.  Family with pt.  Pt sleepy.  Report off to butch rn.

## 2014-11-30 NOTE — ED Provider Notes (Signed)
CSN: [id number]     Arrival date & time 11/30/14  1940 History   First MD Initiated Contact with Patient 11/30/14 2013     Chief Complaint  Patient presents with  . Leg Swelling     (Consider location/radiation/quality/duration/timing/severity/associated sxs/prior Treatment) HPI  69 year old female with a history of diabetes presents to the emergency department for evaluation of right leg swelling and foot ulceration. Patient has had one month of right leg swelling worsening wound at the base of her right foot. Patient denies any trauma or injury. She remains ambulatory. She lives at home with her husband. She denies any chest pain, cough, shortness of breath. She has had low-grade subjective fevers. She is not taking any medications for pain. Right foot and leg pain is 9 out of 10.   Past Medical History  Diagnosis Date  . Hypertension   . Diabetes mellitus without complication   . Anemia   . Coronary artery disease   . CHF (congestive heart failure)   . Glaucoma   . Hyperlipidemia   . Chronic kidney disease   . Neuropathy   . Chronic anemia    Past Surgical History  Procedure Laterality Date  . Cardiac catheterization    . Esophagogastroduodenoscopy (egd) with propofol N/A 09/24/2014    Elliott-gastritis & normal Billroth II changes   . Colonoscopy with propofol N/A 09/24/2014    Elliott-Incomplete secondary to prep  . Coronary angioplasty  2016    stent placed  . Bilroth ii procedure    . Colonoscopy with propofol N/A 11/12/2014    Procedure: COLONOSCOPY WITH PROPOFOL;  Surgeon: Manya Silvas, MD;  Location: [facility] ENDOSCOPY;  Service: Endoscopy;  Laterality: N/A;   Family History  Problem Relation Age of Onset  . Diabetes Mother   . Heart disease Father   . Diabetes Sister   . Lung cancer Brother   . Cancer Brother   . Diabetes Brother   . Diabetes Sister    History  Substance Use Topics  . Smoking status: Former Research scientist (life sciences)  . Smokeless tobacco: Never Used      Comment: quit many years ago  . Alcohol Use: No   OB History    Gravida Para Term Preterm AB TAB SAB Ectopic Multiple Living   2 1             Review of Systems  Constitutional: Positive for fever. Negative for chills, activity change and fatigue.  HENT: Negative for congestion, sinus pressure and sore throat.   Eyes: Negative for visual disturbance.  Respiratory: Negative for cough, chest tightness and shortness of breath.   Cardiovascular: Positive for leg swelling (right). Negative for chest pain.  Gastrointestinal: Negative for nausea, vomiting, abdominal pain and diarrhea.  Genitourinary: Negative for dysuria.  Musculoskeletal: Negative for arthralgias and gait problem.  Skin: Positive for wound (right foot). Negative for rash.  Neurological: Negative for weakness, numbness and headaches.  Hematological: Negative for adenopathy.  Psychiatric/Behavioral: Negative for behavioral problems, confusion and agitation.      Allergies  Codeine  Home Medications   Prior to Admission medications   Medication Sig Start Date End Date Taking? Authorizing Provider  docusate sodium (COLACE) 100 MG capsule Take 100 mg by mouth 2 (two) times daily.    Historical Provider, MD  feeding supplement, GLUCERNA SHAKE, (GLUCERNA SHAKE) LIQD Take 237 mLs by mouth 2 (two) times daily between meals. 09/20/14   Loletha Grayer, MD  ferrous sulfate 325 (65 FE) MG tablet Take 325 mg  by mouth daily.    Historical Provider, MD  furosemide (LASIX) 40 MG tablet Take 1 tablet (40 mg total) by mouth 2 (two) times daily. 11/02/14   Alisa Graff, FNP  glimepiride (AMARYL) 2 MG tablet Take 1 mg by mouth daily with breakfast.    Historical Provider, MD  hydrALAZINE (APRESOLINE) 100 MG tablet Take 50 mg by mouth 3 (three) times daily.     Historical Provider, MD  isosorbide dinitrate (ISORDIL) 30 MG tablet Take 30 mg by mouth 2 (two) times daily.     Historical Provider, MD  Omega-3 Fatty Acids (FISH OIL PO) Take 1  capsule by mouth daily.    Historical Provider, MD  omeprazole (PRILOSEC) 20 MG capsule Take 20 mg by mouth daily.    Historical Provider, MD  potassium chloride SA (K-DUR,KLOR-CON) 20 MEQ tablet Take 1 tablet (20 mEq total) by mouth daily. 11/02/14   Alisa Graff, FNP  propranolol (INDERAL) 20 MG tablet Take 40 mg by mouth 3 (three) times daily.    Historical Provider, MD  ramipril (ALTACE) 5 MG capsule Take 5 mg by mouth daily.     Historical Provider, MD  simvastatin (ZOCOR) 20 MG tablet Take 20 mg by mouth every evening.     Historical Provider, MD   BP 192/80 mmHg  Pulse 88  Temp(Src) 99.9 F (37.7 C) (Oral)  Resp 16  Ht 5\' 7"  (1.702 m)  Wt 146 lb (66.225 kg)  BMI 22.86 kg/m2  SpO2 96% Physical Exam  Constitutional: She appears well-developed and well-nourished. No distress.  HENT:  Head: Normocephalic and atraumatic.  Eyes: Conjunctivae and EOM are normal. Right eye exhibits no discharge. Left eye exhibits no discharge.  Neck: Normal range of motion. Neck supple.  Cardiovascular: Normal rate, regular rhythm and normal heart sounds.   Pulmonary/Chest: Effort normal and breath sounds normal. No respiratory distress. She has no wheezes. She has no rales.  Abdominal: Soft. Bowel sounds are normal. She exhibits no distension.  Musculoskeletal: Normal range of motion. She exhibits edema (right lower extremity from the calf distal, circumferential) and tenderness (right foot).  Neurological: She is alert.  Skin: Skin is warm.  3 x 3 cm moist ulceration at the plantar aspect of the right foot between the great and second toe with purulent drainage. There is surrounding erythema that extends to the dorsum of the right foot along the first second and third metatarsals.  Psychiatric: She has a normal mood and affect. Her behavior is normal.    ED Course  Procedures (including critical care time) Labs Review Labs Reviewed  CULTURE, BLOOD (ROUTINE X 2)  CULTURE, BLOOD (ROUTINE X 2)   WOUND CULTURE  ANAEROBIC CULTURE  CBC WITH DIFFERENTIAL/PLATELET  BASIC METABOLIC PANEL  URINALYSIS COMPLETEWITH MICROSCOPIC ([facility] ONLY)    Imaging Review Dg Foot Complete Right  11/30/2014   CLINICAL DATA:  Ulceration of the great toe.  EXAM: RIGHT FOOT COMPLETE - 3+ VIEW  COMPARISON:  None.  FINDINGS: No evidence of cortical erosion of first digit to suggest osteomyelitis.  IMPRESSION: No evidence of osteomyelitis.   Electronically Signed   By: Suzy Bouchard M.D.   On: 11/30/2014 21:22     EKG Interpretation None      MDM   Final diagnoses:  Swelling of right lower extremity  Right foot ulcer  Cellulitis of right foot    ----------------------------------------- 11:53 PM on 11/30/2014 Patient resting in NAD. Pending ultrasound of right lower extremity. Patient moved to room  8. Care is transferred to Dr. Joni Fears. She is started on clindamycin 600 mg IV, Cipro 400 mg IV for right foot ulcer and cellulitis.   Duanne Guess, PA-C 11/30/14 2358  Carrie Mew, MD 12/01/14 [phone]

## 2014-12-01 ENCOUNTER — Emergency Department: Payer: Medicare HMO

## 2014-12-01 LAB — LACTIC ACID, PLASMA: LACTIC ACID, VENOUS: 1.2 mmol/L (ref 0.5–2.0)

## 2014-12-01 LAB — SEDIMENTATION RATE: Sed Rate: 43 mm/hr — ABNORMAL HIGH (ref 0–30)

## 2014-12-01 MED ORDER — CIPROFLOXACIN HCL 500 MG PO TABS: 500.0000 mg | ORAL_TABLET | Freq: Two times a day (BID) | ORAL | Status: DC

## 2014-12-01 MED ORDER — CLINDAMYCIN HCL 150 MG PO CAPS
450.0000 mg | ORAL_CAPSULE | Freq: Three times a day (TID) | ORAL | Status: DC
Start: 2014-12-01 — End: 2014-12-13

## 2014-12-01 MED ORDER — SODIUM CHLORIDE 0.9 % IV BOLUS (SEPSIS)
1000.0000 mL | Freq: Once | INTRAVENOUS | Status: AC
  Administered 2014-12-01: 1000 mL via INTRAVENOUS

## 2014-12-01 NOTE — Discharge Instructions (Signed)
Cellulitis Cellulitis is an infection of the skin and the tissue beneath it. The infected area is usually red and tender. Cellulitis occurs most often in the arms and lower legs.  CAUSES  Cellulitis is caused by bacteria that enter the skin through cracks or cuts in the skin. The most common types of bacteria that cause cellulitis are staphylococci and streptococci. SIGNS AND SYMPTOMS   Redness and warmth.  Swelling.  Tenderness or pain.  Fever. DIAGNOSIS  Your health care provider can usually determine what is wrong based on a physical exam. Blood tests may also be done. TREATMENT  Treatment usually involves taking an antibiotic medicine. HOME CARE INSTRUCTIONS   Take your antibiotic medicine as directed by your health care provider. Finish the antibiotic even if you start to feel better.  Keep the infected arm or leg elevated to reduce swelling.  Apply a warm cloth to the affected area up to 4 times per day to relieve pain.  Take medicines only as directed by your health care provider.  Keep all follow-up visits as directed by your health care provider. SEEK MEDICAL CARE IF:   You notice red streaks coming from the infected area.  Your red area gets larger or turns dark in color.  Your bone or joint underneath the infected area becomes painful after the skin has healed.  Your infection returns in the same area or another area.  You notice a swollen bump in the infected area.  You develop new symptoms.  You have a fever. SEEK IMMEDIATE MEDICAL CARE IF:   You feel very sleepy.  You develop vomiting or diarrhea.  You have a general ill feeling (malaise) with muscle aches and pains. MAKE SURE YOU:   Understand these instructions.  Will watch your condition.  Will get help right away if you are not doing well or get worse. Document Released: 01/28/2005 Document Revised: 09/04/2013 Document Reviewed: 07/06/2011 [facility] Patient Information 2015 [place], [place].  This information is not intended to replace advice given to you by your health care provider. Make sure you discuss any questions you have with your health care provider.  Diabetes and Foot Care Diabetes may cause you to have problems because of poor blood supply (circulation) to your feet and legs. This may cause the skin on your feet to become thinner, break easier, and heal more slowly. Your skin may become dry, and the skin may peel and crack. You may also have nerve damage in your legs and feet causing decreased feeling in them. You may not notice minor injuries to your feet that could lead to infections or more serious problems. Taking care of your feet is one of the most important things you can do for yourself.  HOME CARE INSTRUCTIONS  Wear shoes at all times, even in the house. Do not go barefoot. Bare feet are easily injured.  Check your feet daily for blisters, cuts, and redness. If you cannot see the bottom of your feet, use a mirror or ask someone for help.  Wash your feet with warm water (do not use hot water) and mild soap. Then pat your feet and the areas between your toes until they are completely dry. Do not soak your feet as this can dry your skin.  Apply a moisturizing lotion or petroleum jelly (that does not contain alcohol and is unscented) to the skin on your feet and to dry, brittle toenails. Do not apply lotion between your toes.  Trim your toenails straight across. Do  not dig under them or around the cuticle. File the edges of your nails with an emery board or nail file.  Do not cut corns or calluses or try to remove them with medicine.  Wear clean socks or stockings every day. Make sure they are not too tight. Do not wear knee-high stockings since they may decrease blood flow to your legs.  Wear shoes that fit properly and have enough cushioning. To break in new shoes, wear them for just a few hours a day. This prevents you from injuring your feet. Always look in your  shoes before you put them on to be sure there are no objects inside.  Do not cross your legs. This may decrease the blood flow to your feet.  If you find a minor scrape, cut, or break in the skin on your feet, keep it and the skin around it clean and dry. These areas may be cleansed with mild soap and water. Do not cleanse the area with peroxide, alcohol, or iodine.  When you remove an adhesive bandage, be sure not to damage the skin around it.  If you have a wound, look at it several times a day to make sure it is healing.  Do not use heating pads or hot water bottles. They may burn your skin. If you have lost feeling in your feet or legs, you may not know it is happening until it is too late.  Make sure your health care provider performs a complete foot exam at least annually or more often if you have foot problems. Report any cuts, sores, or bruises to your health care provider immediately. SEEK MEDICAL CARE IF:   You have an injury that is not healing.  You have cuts or breaks in the skin.  You have an ingrown nail.  You notice redness on your legs or feet.  You feel burning or tingling in your legs or feet.  You have pain or cramps in your legs and feet.  Your legs or feet are numb.  Your feet always feel cold. SEEK IMMEDIATE MEDICAL CARE IF:   There is increasing redness, swelling, or pain in or around a wound.  There is a red line that goes up your leg.  Pus is coming from a wound.  You develop a fever or as directed by your health care provider.  You notice a bad smell coming from an ulcer or wound. Document Released: 04/17/2000 Document Revised: 12/21/2012 Document Reviewed: 09/27/2012 [facility] Patient Information 2015 [place], [place]. This information is not intended to replace advice given to you by your health care provider. Make sure you discuss any questions you have with your health care provider.  Pressure Ulcer A pressure ulcer is a sore that has formed  from the breakdown of skin and exposure of deeper layers of tissue. It develops in areas of the body where there is unrelieved pressure. Pressure ulcers are usually found over a bony area, such as the shoulder blades, spine, lower back, hips, knees, ankles, and heels. Pressure ulcers vary in severity. Your health care provider may determine the severity (stage) of your pressure ulcer. The stages include:  Stage I--The skin is red, and when the skin is pressed, it stays red.  Stage II--The top layer of skin is gone, and there is a shallow, pink ulcer.  Stage III--The ulcer becomes deeper, and it is more difficult to see the whole wound. Also, there may be yellow or brown parts, as well as pink  and red parts.  Stage IV--The ulcer may be deep and red, pink, brown, white, or yellow. Bone or muscle may be seen.  Unstageable pressure ulcer--The ulcer is covered almost completely with black, brown, or yellow tissue. It is not known how deep the ulcer is or what stage it is until this covering comes off.  Suspected deep tissue injury--A person's skin can be injured from pressure or pulling on the skin when his or her position is changed. The skin appears purple or maroon. There may not be an opening in the skin, but there could be a blood-filled blister. This deep tissue injury is often difficult to see in people with darker skin tones. The site may open and become deeper in time. However, early interventions will help the area heal and may prevent the area from opening. CAUSES  Pressure ulcers are caused by pressure against the skin that limits the flow of blood to the skin and nearby tissues. There are many risk factors that can lead to pressure sores. RISK FACTORS  Decreased ability to move.  Decreased ability to feel pain or discomfort.  Excessive skin moisture from urine, stool, sweat, or secretions.  Poor nutrition.  Dehydration.  Tobacco, drug, or alcohol abuse.  Having someone pull on  bedsheets that are under you, such as when health care workers are changing your position in a hospital bed.  Obesity.  Increased adult age.  Hospitalization in a critical care unit for longer than 4 days with use of medical devices.  Prolonged use of medical devices.  Critical illness.  Anemia.  Traumatic brain injury.  Spinal cord injury.  Stroke.  Diabetes.  Poor blood glucose control.  Low blood pressure (hypotension).  Low oxygen levels.  Medicines that reduce blood flow.  Infection. DIAGNOSIS  Your health care provider will diagnose your pressure ulcer based on its appearance. The health care provider may determine the stage of your pressure ulcer as well. Tests may be done to check for infection, to assess your circulation, or to check for other diseases, such as diabetes. TREATMENT  Treatment of your pressure ulcer begins with determining what stage the ulcer is in. Your treatment team may include your health care provider, a wound care specialist, a nutritionist, a physical therapist, and a Psychologist, sport and exercise. Possible treatments may include:   Moving or repositioning every 1-2 hours.  Using beds or mattresses to shift your body weight and pressure points frequently.  Improving your diet.  Cleaning and bandaging (dressing) the open wound.  Giving antibiotic medicines.  Removing damaged tissue.  Surgery and sometimes skin grafts. HOME CARE INSTRUCTIONS  If you were hospitalized, follow the care plan that was started in the hospital.  Avoid staying in the same position for more than 2 hours. Use padding, devices, or mattresses to cushion your pressure points as directed by your health care provider.  Eat a well-balanced diet. Take nutritional supplements and vitamins as directed by your health care provider.  Keep all follow-up appointments.  Only take over-the-counter or prescription medicines for pain, fever, or discomfort as directed by your health care  provider. SEEK MEDICAL CARE IF:   Your pressure ulcer is not improving.  You do not know how to care for your pressure ulcer.  You notice other areas of redness on your skin.  You have a fever. SEEK IMMEDIATE MEDICAL CARE IF:   You have increasing redness, swelling, or pain in your pressure ulcer.  You notice pus coming from your pressure ulcer.  You notice a bad smell coming from the wound or dressing.  Your pressure ulcer opens up again. Document Released: 04/20/2005 Document Revised: 04/25/2013 Document Reviewed: 12/26/2012 [facility] Patient Information 2015 [place], [place]. This information is not intended to replace advice given to you by your health care provider. Make sure you discuss any questions you have with your health care provider.

## 2014-12-01 NOTE — ED Notes (Signed)
Pt has an open wound between the 1st and 2nd toes on the right foot. Wound was dressed with Xeroform dressing and wrapped in Kerlex. Dressing checked for overtighness by inserting two fingers between the skin and dressing; toes noted to be warm and dry with a cap refill <3 secs 10 minutes after application. Pt's family instructed to regularly monitor the dressing to check for deficits in circulation, temperature or sensation and to remove the dressing if any of those s/sx's occur.

## 2014-12-01 NOTE — ED Notes (Addendum)
Called and left a message with the [facility] referral service as provided on the [facility] (phone number [phone]). Left information regarding my name, the pt's medical record number, pt's name and date of birth, pt's age, location of the wound to be treated, referring EDP's name, and pt's phone number for follow-up.

## 2014-12-03 ENCOUNTER — Emergency Department: Payer: Medicare HMO

## 2014-12-03 ENCOUNTER — Other Ambulatory Visit: Payer: Self-pay

## 2014-12-03 ENCOUNTER — Emergency Department
Admission: EM | Admit: 2014-12-03 | Discharge: 2014-12-03 | Disposition: A | Discharge status: Other Acute Inpatient Hospital | Payer: Medicare HMO | Attending: Emergency Medicine | Admitting: Emergency Medicine

## 2014-12-03 ENCOUNTER — Encounter: Payer: Self-pay | Admitting: Emergency Medicine

## 2014-12-03 ENCOUNTER — Inpatient Hospital Stay (HOSPITAL_COMMUNITY)
Admission: AD | Admit: 2014-12-03 | Discharge: 2014-12-13 | DRG: 025 | Disposition: A | Discharge status: Skilled Nursing Facility | Payer: Medicare HMO | Source: Other Acute Inpatient Hospital | Attending: Internal Medicine | Admitting: Internal Medicine

## 2014-12-03 DIAGNOSIS — E871 Hypo-osmolality and hyponatremia: Secondary | ICD-10-CM | POA: Diagnosis not present

## 2014-12-03 DIAGNOSIS — L97519 Non-pressure chronic ulcer of other part of right foot with unspecified severity: Secondary | ICD-10-CM | POA: Diagnosis present

## 2014-12-03 DIAGNOSIS — Y95 Nosocomial condition: Secondary | ICD-10-CM | POA: Diagnosis not present

## 2014-12-03 DIAGNOSIS — Z87891 Personal history of nicotine dependence: Secondary | ICD-10-CM | POA: Insufficient documentation

## 2014-12-03 DIAGNOSIS — L97511 Non-pressure chronic ulcer of other part of right foot limited to breakdown of skin: Secondary | ICD-10-CM | POA: Diagnosis present

## 2014-12-03 DIAGNOSIS — G8194 Hemiplegia, unspecified affecting left nondominant side: Secondary | ICD-10-CM | POA: Diagnosis present

## 2014-12-03 DIAGNOSIS — L8990 Pressure ulcer of unspecified site, unspecified stage: Secondary | ICD-10-CM | POA: Diagnosis not present

## 2014-12-03 DIAGNOSIS — H409 Unspecified glaucoma: Secondary | ICD-10-CM | POA: Diagnosis present

## 2014-12-03 DIAGNOSIS — E119 Type 2 diabetes mellitus without complications: Secondary | ICD-10-CM | POA: Insufficient documentation

## 2014-12-03 DIAGNOSIS — R531 Weakness: Secondary | ICD-10-CM | POA: Diagnosis present

## 2014-12-03 DIAGNOSIS — N189 Chronic kidney disease, unspecified: Secondary | ICD-10-CM | POA: Insufficient documentation

## 2014-12-03 DIAGNOSIS — I6200 Nontraumatic subdural hemorrhage, unspecified: Secondary | ICD-10-CM | POA: Diagnosis present

## 2014-12-03 DIAGNOSIS — R509 Fever, unspecified: Secondary | ICD-10-CM

## 2014-12-03 DIAGNOSIS — E11649 Type 2 diabetes mellitus with hypoglycemia without coma: Secondary | ICD-10-CM | POA: Diagnosis present

## 2014-12-03 DIAGNOSIS — S065X9A Traumatic subdural hemorrhage with loss of consciousness of unspecified duration, initial encounter: Secondary | ICD-10-CM

## 2014-12-03 DIAGNOSIS — N183 Chronic kidney disease, stage 3 (moderate): Secondary | ICD-10-CM | POA: Diagnosis present

## 2014-12-03 DIAGNOSIS — I129 Hypertensive chronic kidney disease with stage 1 through stage 4 chronic kidney disease, or unspecified chronic kidney disease: Secondary | ICD-10-CM | POA: Insufficient documentation

## 2014-12-03 DIAGNOSIS — I5032 Chronic diastolic (congestive) heart failure: Secondary | ICD-10-CM | POA: Diagnosis present

## 2014-12-03 DIAGNOSIS — Z79899 Other long term (current) drug therapy: Secondary | ICD-10-CM | POA: Diagnosis not present

## 2014-12-03 DIAGNOSIS — I2510 Atherosclerotic heart disease of native coronary artery without angina pectoris: Secondary | ICD-10-CM | POA: Diagnosis present

## 2014-12-03 DIAGNOSIS — L89154 Pressure ulcer of sacral region, stage 4: Secondary | ICD-10-CM | POA: Diagnosis present

## 2014-12-03 DIAGNOSIS — E785 Hyperlipidemia, unspecified: Secondary | ICD-10-CM | POA: Diagnosis present

## 2014-12-03 DIAGNOSIS — M6281 Muscle weakness (generalized): Secondary | ICD-10-CM | POA: Insufficient documentation

## 2014-12-03 DIAGNOSIS — R4182 Altered mental status, unspecified: Secondary | ICD-10-CM | POA: Diagnosis present

## 2014-12-03 DIAGNOSIS — D649 Anemia, unspecified: Secondary | ICD-10-CM | POA: Diagnosis present

## 2014-12-03 DIAGNOSIS — G629 Polyneuropathy, unspecified: Secondary | ICD-10-CM | POA: Diagnosis present

## 2014-12-03 DIAGNOSIS — E11621 Type 2 diabetes mellitus with foot ulcer: Secondary | ICD-10-CM | POA: Diagnosis present

## 2014-12-03 DIAGNOSIS — S065XAA Traumatic subdural hemorrhage with loss of consciousness status unknown, initial encounter: Secondary | ICD-10-CM | POA: Diagnosis present

## 2014-12-03 DIAGNOSIS — E11622 Type 2 diabetes mellitus with other skin ulcer: Secondary | ICD-10-CM | POA: Diagnosis present

## 2014-12-03 DIAGNOSIS — E1140 Type 2 diabetes mellitus with diabetic neuropathy, unspecified: Secondary | ICD-10-CM | POA: Diagnosis present

## 2014-12-03 DIAGNOSIS — Z959 Presence of cardiac and vascular implant and graft, unspecified: Secondary | ICD-10-CM | POA: Diagnosis not present

## 2014-12-03 DIAGNOSIS — Z6821 Body mass index (BMI) 21.0-21.9, adult: Secondary | ICD-10-CM | POA: Diagnosis not present

## 2014-12-03 DIAGNOSIS — J189 Pneumonia, unspecified organism: Secondary | ICD-10-CM | POA: Diagnosis not present

## 2014-12-03 LAB — COMPREHENSIVE METABOLIC PANEL
ALK PHOS: 50 U/L (ref 38–126)
ALT: 13 U/L — AB (ref 14–54)
ANION GAP: 6 (ref 5–15)
AST: 26 U/L (ref 15–41)
Albumin: 2.4 g/dL — ABNORMAL LOW (ref 3.5–5.0)
BUN: 30 mg/dL — ABNORMAL HIGH (ref 6–20)
CALCIUM: 7.7 mg/dL — AB (ref 8.9–10.3)
CHLORIDE: 106 mmol/L (ref 101–111)
CO2: 21 mmol/L — ABNORMAL LOW (ref 22–32)
Creatinine, Ser: 1.38 mg/dL — ABNORMAL HIGH (ref 0.44–1.00)
GFR calc Af Amer: 44 mL/min — ABNORMAL LOW (ref >60)
GFR calc non Af Amer: 38 mL/min — ABNORMAL LOW (ref >60)
GLUCOSE: 152 mg/dL — AB (ref 65–99)
Potassium: 4 mmol/L (ref 3.5–5.1)
SODIUM: 133 mmol/L — AB (ref 135–145)
Total Bilirubin: 0.4 mg/dL (ref 0.3–1.2)
Total Protein: 6.1 g/dL — ABNORMAL LOW (ref 6.5–8.1)

## 2014-12-03 LAB — CBC
HCT: 25.8 % — ABNORMAL LOW (ref 35.0–47.0)
Hemoglobin: 8.3 g/dL — ABNORMAL LOW (ref 12.0–16.0)
MCH: 27.2 pg (ref 26.0–34.0)
MCHC: 32.1 g/dL (ref 32.0–36.0)
MCV: 84.8 fL (ref 80.0–100.0)
Platelets: 257 10*3/uL (ref 150–440)
RBC: 3.04 MIL/uL — AB (ref 3.80–5.20)
RDW: 17.4 % — AB (ref 11.5–14.5)
WBC: 5.8 10*3/uL (ref 3.6–11.0)

## 2014-12-03 LAB — URINALYSIS COMPLETE WITH MICROSCOPIC (ARMC ONLY)
Bilirubin Urine: NEGATIVE
Glucose, UA: NEGATIVE mg/dL
KETONES UR: NEGATIVE mg/dL
Leukocytes, UA: NEGATIVE
NITRITE: NEGATIVE
Protein, ur: 100 mg/dL — AB
SPECIFIC GRAVITY, URINE: 1.016 (ref 1.005–1.030)
pH: 5 (ref 5.0–8.0)

## 2014-12-03 LAB — GLUCOSE, CAPILLARY
Glucose-Capillary: 63 mg/dL — ABNORMAL LOW (ref 65–99)
Glucose-Capillary: 76 mg/dL (ref 65–99)

## 2014-12-03 LAB — PROTIME-INR
INR: 1.27 (ref 0.00–1.49)
Prothrombin Time: 16 seconds — ABNORMAL HIGH (ref 11.6–15.2)

## 2014-12-03 LAB — TROPONIN I: Troponin I: 0.11 ng/mL — ABNORMAL HIGH (ref <0.031)

## 2014-12-03 LAB — APTT: aPTT: 35 seconds (ref 24–37)

## 2014-12-03 LAB — MRSA PCR SCREENING: MRSA by PCR: NEGATIVE

## 2014-12-03 MED ORDER — LABETALOL HCL 5 MG/ML IV SOLN
20.0000 mg | Freq: Once | INTRAVENOUS | Status: AC
  Administered 2014-12-03: 20 mg via INTRAVENOUS

## 2014-12-03 MED ORDER — GLIMEPIRIDE 1 MG PO TABS
1.0000 mg | ORAL_TABLET | Freq: Every day | ORAL | Status: DC
  Administered 2014-12-04 – 2014-12-11 (×5): 1 mg via ORAL
  Filled 2014-12-03 (×11): qty 1

## 2014-12-03 MED ORDER — ISOSORBIDE DINITRATE 30 MG PO TABS
30.0000 mg | ORAL_TABLET | Freq: Two times a day (BID) | ORAL | Status: DC
  Administered 2014-12-03 – 2014-12-13 (×18): 30 mg via ORAL
  Filled 2014-12-03 (×22): qty 1

## 2014-12-03 MED ORDER — PROPRANOLOL HCL 40 MG PO TABS
40.0000 mg | ORAL_TABLET | Freq: Three times a day (TID) | ORAL | Status: DC
  Administered 2014-12-03 – 2014-12-13 (×27): 40 mg via ORAL
  Filled 2014-12-03 (×32): qty 1

## 2014-12-03 MED ORDER — STROKE: EARLY STAGES OF RECOVERY BOOK
Freq: Once | Status: AC
  Administered 2014-12-03: 23:00:00
  Filled 2014-12-03: qty 1

## 2014-12-03 MED ORDER — RAMIPRIL 5 MG PO CAPS
5.0000 mg | ORAL_CAPSULE | Freq: Every day | ORAL | Status: DC
  Administered 2014-12-03 – 2014-12-13 (×10): 5 mg via ORAL
  Filled 2014-12-03 (×11): qty 1

## 2014-12-03 MED ORDER — POTASSIUM CHLORIDE CRYS ER 20 MEQ PO TBCR
20.0000 meq | EXTENDED_RELEASE_TABLET | Freq: Every day | ORAL | Status: DC
  Administered 2014-12-03 – 2014-12-07 (×4): 20 meq via ORAL
  Filled 2014-12-03 (×6): qty 1

## 2014-12-03 MED ORDER — LABETALOL HCL 5 MG/ML IV SOLN
10.0000 mg | INTRAVENOUS | Status: DC | PRN
  Administered 2014-12-04: 40 mg via INTRAVENOUS
  Administered 2014-12-04 (×2): 20 mg via INTRAVENOUS
  Administered 2014-12-04: 40 mg via INTRAVENOUS
  Administered 2014-12-04: 20 mg via INTRAVENOUS
  Administered 2014-12-05 (×3): 40 mg via INTRAVENOUS
  Administered 2014-12-05: 20 mg via INTRAVENOUS
  Administered 2014-12-05: 40 mg via INTRAVENOUS
  Filled 2014-12-03 (×2): qty 4
  Filled 2014-12-03 (×3): qty 8
  Filled 2014-12-03: qty 4
  Filled 2014-12-03 (×2): qty 8
  Filled 2014-12-03: qty 4
  Filled 2014-12-03: qty 8
  Filled 2014-12-03: qty 4
  Filled 2014-12-03: qty 8
  Filled 2014-12-03: qty 4

## 2014-12-03 MED ORDER — LABETALOL HCL 5 MG/ML IV SOLN
INTRAVENOUS | Status: AC
  Administered 2014-12-03: 20 mg via INTRAVENOUS
  Filled 2014-12-03: qty 4

## 2014-12-03 MED ORDER — FUROSEMIDE 40 MG PO TABS
40.0000 mg | ORAL_TABLET | Freq: Two times a day (BID) | ORAL | Status: DC
  Administered 2014-12-04 – 2014-12-13 (×17): 40 mg via ORAL
  Filled 2014-12-03 (×22): qty 1

## 2014-12-03 MED ORDER — INSULIN ASPART 100 UNIT/ML ~~LOC~~ SOLN
0.0000 [IU] | Freq: Three times a day (TID) | SUBCUTANEOUS | Status: DC
  Administered 2014-12-04 (×2): 2 [IU] via SUBCUTANEOUS
  Administered 2014-12-04: 3 [IU] via SUBCUTANEOUS
  Administered 2014-12-05: 2 [IU] via SUBCUTANEOUS
  Administered 2014-12-05: 3 [IU] via SUBCUTANEOUS
  Administered 2014-12-06 – 2014-12-07 (×3): 2 [IU] via SUBCUTANEOUS

## 2014-12-03 MED ORDER — SIMVASTATIN 20 MG PO TABS
20.0000 mg | ORAL_TABLET | Freq: Every day | ORAL | Status: DC
  Administered 2014-12-03 – 2014-12-12 (×10): 20 mg via ORAL
  Filled 2014-12-03 (×11): qty 1

## 2014-12-03 MED ORDER — ACETAMINOPHEN 650 MG RE SUPP
650.0000 mg | RECTAL | Status: DC | PRN
  Administered 2014-12-06: 650 mg via RECTAL
  Filled 2014-12-03: qty 1

## 2014-12-03 MED ORDER — PANTOPRAZOLE SODIUM 40 MG PO TBEC
40.0000 mg | DELAYED_RELEASE_TABLET | Freq: Every day | ORAL | Status: DC
  Administered 2014-12-04 – 2014-12-05 (×2): 40 mg via ORAL
  Filled 2014-12-03 (×3): qty 1

## 2014-12-03 MED ORDER — ACETAMINOPHEN 325 MG PO TABS
650.0000 mg | ORAL_TABLET | ORAL | Status: DC | PRN
  Administered 2014-12-05 – 2014-12-13 (×9): 650 mg via ORAL
  Filled 2014-12-03 (×10): qty 2

## 2014-12-03 MED ORDER — FERROUS SULFATE 325 (65 FE) MG PO TABS
325.0000 mg | ORAL_TABLET | Freq: Every day | ORAL | Status: DC
  Administered 2014-12-04 – 2014-12-13 (×9): 325 mg via ORAL
  Filled 2014-12-03 (×10): qty 1

## 2014-12-03 MED ORDER — DOCUSATE SODIUM 100 MG PO CAPS
100.0000 mg | ORAL_CAPSULE | Freq: Two times a day (BID) | ORAL | Status: DC
  Administered 2014-12-03 – 2014-12-13 (×19): 100 mg via ORAL
  Filled 2014-12-03 (×21): qty 1

## 2014-12-03 NOTE — ED Provider Notes (Signed)
[facility] Emergency Department Provider Note   ____________________________________________  Time seen: 3:50 PM I have reviewed the triage vital signs and the triage nursing note.  HISTORY  Chief Complaint Altered Mental Status   Historian Patient, who is a limited historian Son and husband provide more history  HPI [name] is a 69 y.o. female who lives at home with her husband and was seen recently for right plantar ulcer on Friday, who is here out of concern for generalized weakness, trouble walking, decreased by mouth intake, and episodes of confusion per her family. Husband states that she has not been eating very well for "a while" and that since Friday it seemed like she's been weak all over and having trouble walking on her own. She was placed on clindamycin and Cipro on Friday and apparently was still feeling bad and weak on Saturday, and then on Sunday she seems pretty clear and had a little more strength, and then this morning she was acting confused again and had trouble walking due to generalized weakness. There is some concern that she was weak on the left side due to the way she was standing.  Family has a podiatry appointment tomorrow and a wound center appointment on Thursday.    Past Medical History  Diagnosis Date  . Hypertension   . Diabetes mellitus without complication   . Anemia   . Coronary artery disease   . CHF (congestive heart failure)   . Glaucoma   . Hyperlipidemia   . Chronic kidney disease   . Neuropathy   . Chronic anemia     Patient Active Problem List   Diagnosis Date Noted  . Chronic diastolic heart failure 20/25/4270  . Melena 10/02/2014  . DM (diabetes mellitus) 02/04/2010  . ANEMIA, CHRONIC 02/04/2010  . Essential hypertension 02/04/2010  . Coronary atherosclerosis 02/04/2010    Past Surgical History  Procedure Laterality Date  . Cardiac catheterization    . Esophagogastroduodenoscopy (egd) with  propofol N/A 09/24/2014    Elliott-gastritis & normal Billroth II changes   . Colonoscopy with propofol N/A 09/24/2014    Elliott-Incomplete secondary to prep  . Coronary angioplasty  2016    stent placed  . Bilroth ii procedure    . Colonoscopy with propofol N/A 11/12/2014    Procedure: COLONOSCOPY WITH PROPOFOL;  Surgeon: Manya Silvas, MD;  Location: [facility] ENDOSCOPY;  Service: Endoscopy;  Laterality: N/A;    Current Outpatient Rx  Name  Route  Sig  Dispense  Refill  . ciprofloxacin (CIPRO) 500 MG tablet   Oral   Take 1 tablet (500 mg total) by mouth 2 (two) times daily.   28 tablet   0   . clindamycin (CLEOCIN) 150 MG capsule   Oral   Take 3 capsules (450 mg total) by mouth 3 (three) times daily.   90 capsule   0   . docusate sodium (COLACE) 100 MG capsule   Oral   Take 100 mg by mouth 2 (two) times daily.         . feeding supplement, GLUCERNA SHAKE, (GLUCERNA SHAKE) LIQD   Oral   Take 237 mLs by mouth 2 (two) times daily between meals.   60 Can   0   . ferrous sulfate 325 (65 FE) MG tablet   Oral   Take 325 mg by mouth daily.         . furosemide (LASIX) 40 MG tablet   Oral   Take 1 tablet (  40 mg total) by mouth 2 (two) times daily.   60 tablet   5   . glimepiride (AMARYL) 2 MG tablet   Oral   Take 1 mg by mouth daily with breakfast.         . hydrALAZINE (APRESOLINE) 100 MG tablet   Oral   Take 50 mg by mouth 3 (three) times daily.          . isosorbide dinitrate (ISORDIL) 30 MG tablet   Oral   Take 30 mg by mouth 2 (two) times daily.         . nitroGLYCERIN (NITROSTAT) 0.4 MG SL tablet   Sublingual   Place 0.4 mg under the tongue every 5 (five) minutes as needed for chest pain.         . Omega-3 Fatty Acids (FISH OIL) 1000 MG CAPS   Oral   Take 1 capsule by mouth daily.         Marland Kitchen omeprazole (PRILOSEC) 20 MG capsule   Oral   Take 20 mg by mouth daily.         . potassium chloride SA (K-DUR,KLOR-CON) 20 MEQ tablet   Oral    Take 1 tablet (20 mEq total) by mouth daily.   30 tablet   5   . propranolol (INDERAL) 20 MG tablet   Oral   Take 40 mg by mouth 3 (three) times daily.         . ramipril (ALTACE) 5 MG capsule   Oral   Take 5 mg by mouth daily.         . simvastatin (ZOCOR) 20 MG tablet   Oral   Take 20 mg by mouth at bedtime.         . traMADol (ULTRAM) 50 MG tablet   Oral   Take 25-50 mg by mouth 3 (three) times daily as needed for moderate pain.            Allergies Codeine  Family History  Problem Relation Age of Onset  . Diabetes Mother   . Heart disease Father   . Diabetes Sister   . Lung cancer Brother   . Cancer Brother   . Diabetes Brother   . Diabetes Sister     Social History History  Substance Use Topics  . Smoking status: Former Research scientist (life sciences)  . Smokeless tobacco: Never Used     Comment: quit many years ago  . Alcohol Use: No    Review of Systems  Constitutional: Low-grade temperature on Friday, none since then Eyes: Negative for visual changes. ENT: Negative for sore throat. Cardiovascular: Negative for chest pain. Respiratory: Negative for shortness of breath. Gastrointestinal: Negative for abdominal pain, vomiting and diarrhea. Genitourinary: Negative for dysuria. Musculoskeletal: Negative for back pain. Skin: Negative for rash. Neurological: Negative for headache 10 point Review of Systems otherwise negative ____________________________________________   PHYSICAL EXAM:  VITAL SIGNS: ED Triage Vitals  Enc Vitals Group     BP 12/03/14 1403 181/71 mmHg     Pulse Rate 12/03/14 1403 72     Resp 12/03/14 1403 16     Temp 12/03/14 1403 99.4 F (37.4 C)     Temp src --      SpO2 12/03/14 1403 98 %     Weight 12/03/14 1403 140 lb (63.504 kg)     Height 12/03/14 1403 5\' 7"  (1.702 m)     Head Cir --      Peak Flow --  Pain Score 12/03/14 1403 0     Pain Loc --      Pain Edu? --      Excl. in [place]? --      Constitutional: Alert and oriented.  Well appearing and in no distress. Soft voice. Eyes: Conjunctivae are normal. PERRL. Normal extraocular movements. ENT   Head: Normocephalic and atraumatic.   Nose: No congestion/rhinnorhea.   Mouth/Throat: Mucous membranes are moist.   Neck: No stridor. Cardiovascular/Chest: Normal rate, regular rhythm.  No murmurs, rubs, or gallops. Respiratory: Normal respiratory effort without tachypnea nor retractions. Breath sounds are clear and equal bilaterally. No wheezes/rales/rhonchi. Gastrointestinal: Soft. No distention, no guarding, no rebound. Nontender   Genitourinary/rectal:Deferred Musculoskeletal: Trace pitting edema left lower extremity, 2+ lower extremity pitting edema without calf tenderness. Somewhat deep and slightly necrotic overlying tissue to the plantar surface of the right foot between the first and second toe. No cellulitis. Neurologic:  Normal speech and language. 4 out of 5 strength in 4 extremities. Complains of paresthesia to the right lower extremity and left upper extremity. No facial droop. Skin:  Skin is warm, dry and intact. No rash noted. Psychiatric: Mood and affect are normal. Speech and behavior are normal. Patient exhibits appropriate insight and judgment.   ____________________________________________   EKG I, Lisa Roca, MD, the attending physician have personally viewed and interpreted all ECGs.  83 bpm. Right axis deviation. Narrow QRS. Nonspecific ST T wave. ____________________________________________  LABS (pertinent positives/negatives)  Urinalysis rare bacteria and otherwise negative Metabolic panel significant for BUN 30 creatinine 1.38, sodium 133 White blood cell count 5.8, hemoglobin 8.3, platelets 257 Troponin 0.11  ____________________________________________  RADIOLOGY All Xrays were viewed by me. Imaging interpreted by Radiologist.  Discussed with the radiologist by phone results of CT head CT head:  IMPRESSION: Large  broad-based right-sided subdural hematoma has developed since the prior CT. Maximal thickness 1.6 cm with midline shift to left by 1 cm.  __________________________________________  PROCEDURES  Procedure(s) performed: None Critical Care performed: CRITICAL CARE Performed by: Lisa Roca   Total critical care time: 30 minutes  Critical care time was exclusive of separately billable procedures and treating other patients.  Critical care was necessary to treat or prevent imminent or life-threatening deterioration.  Critical care was time spent personally by me on the following activities: development of treatment plan with patient and/or surrogate as well as nursing, discussions with consultants, evaluation of patient's response to treatment, examination of patient, obtaining history from patient or surrogate, ordering and performing treatments and interventions, ordering and review of laboratory studies, ordering and review of radiographic studies, pulse oximetry and re-evaluation of patient's condition.   ____________________________________________   ED COURSE / ASSESSMENT AND PLAN  CONSULTATIONS: Phone consultation with neurology hospitalist at [facility], Dr. Janann Colonel for admission/transfer Phone consultation with neurosurgeon at [facility] did make aware of the patient and will be transferred to neurology service at [facility].  Pertinent labs & imaging results that were available during my care of the patient were reviewed by me and considered in my medical decision making (see chart for details).  Patient does not appear to be septic with no fever, no murmur with white blood cell count, and stable vital signs. Her foot wound does not appear to be overtly infected or cellulitic. I reviewed her x-ray from Friday and there is no signs of osteomyelitis there. She is currently on clindamycin and Cipro antibiotics. She has appropriate follow-up tomorrow with the podiatrist and the  wounds on Thursday.  In regards to complaint of weakness of the left side, on exam she seems just diffusely weak rather than focally weak, and her complaint of paresthesias to the right lower extremity and left upper extremity are not in a typical district fusion for stroke. However with the concern about our mental status as well, I discussed with her obtaining a head CT for further evaluation.  Urinalysis was added for full evaluation for intermittent altered mental status. Here she seems to be a bit of a poor historian, however she is alert and oriented and is not showing any signs of psychosis or delirium.    Discussed with family the results of the CT head showing acute on chronic subdural hematoma. Patient is on blood thinners.  After discussion with neurosurgery and neurology, patient will be transferred for acceptance to [facility] neurology service.  IV labetalol 20 mg IV given for blood pressure 180/70, in the setting of the acute head bleed.  Troponin came out minimally elevated 0.11, patient not cared for blood thinner due to the acute head bleed.  Patient / Family / Caregiver informed of clinical course, medical decision-making process, and agree with plan.   ___________________________________________   FINAL CLINICAL IMPRESSION(S) / ED DIAGNOSES   Final diagnoses:  Weakness generalized  Plantar ulcer of right foot, with unspecified severity  SDH (subdural hematoma)       Lisa Roca, MD 12/03/14 1843

## 2014-12-03 NOTE — ED Notes (Signed)
Family reports pt with increased confusion, reports unable to move left leg very well. Pt was seen here on Friday for infection of right foot. Son reports pt was running fever of 101 over the weekend. Pt placed on clindamycin and cipro.

## 2014-12-03 NOTE — Progress Notes (Addendum)
Hypoglycemic Event  CBG: 63  Treatment: 15 GM carbohydrate snack  Symptoms: Hungry  Follow-up CBG Time: 2329 CBG Result: 76  Possible Reasons for Event: Inadequate meal intake Per pt and pt family, pt had not eaten since 8:00 this morning.  Comments/MD notified: Will recheck blood sugar after she eats her meal.    [name] L  Remember to initiate Hypoglycemia Order Set & complete

## 2014-12-03 NOTE — H&P (Signed)
Admission H&P    Chief Complaint: Left sided weakness HPI: [name] is an 69 y.o. female with a history of diabetes and hypertension who reports that about three weeks ago she began to have a fluctuating mental status.  It appears that she would have good days and bad days. On her bad days she was confused and weak.  She would not take her medications correctly.  On Saturday she was unable to get out of the bed and to the bathroom without assistance.  She ended up going to the bathroom in her bed and prior to getting to the bathroom as well.  Sunday she was getting around fine.  Today she again awakened confused and was unable to use her left side well.  Her family brought her to the ED for further evaluation.    History of Stroke: No.  Date last known well:Date: 12/02/2014 Time last known well: Time: 15:00  tPA Given: No: SDH MRankin: 0  Past Medical History  Diagnosis Date  . Hypertension   . Diabetes mellitus without complication   . Anemia   . Coronary artery disease   . CHF (congestive heart failure)   . Glaucoma   . Hyperlipidemia   . Chronic kidney disease   . Neuropathy   . Chronic anemia     Past Surgical History  Procedure Laterality Date  . Cardiac catheterization    . Esophagogastroduodenoscopy (egd) with propofol N/A 09/24/2014    Elliott-gastritis & normal Billroth II changes   . Colonoscopy with propofol N/A 09/24/2014    Elliott-Incomplete secondary to prep  . Coronary angioplasty  2016    stent placed  . Bilroth ii procedure    . Colonoscopy with propofol N/A 11/12/2014    Procedure: COLONOSCOPY WITH PROPOFOL;  Surgeon: Manya Silvas, MD;  Location: [facility] ENDOSCOPY;  Service: Endoscopy;  Laterality: N/A;    Family History  Problem Relation Age of Onset  . Diabetes Mother   . Heart disease Father   . Diabetes Sister   . Lung cancer Brother   . Cancer Brother   . Diabetes Brother   . Diabetes Sister    Social History:  reports that she has quit  smoking. She has never used smokeless tobacco. She reports that she does not drink alcohol or use illicit drugs.  Allergies:  Allergies  Allergen Reactions  . Codeine Other (See Comments)    Reaction:  Hallucinations    Medications Prior to Admission  Medication Sig Dispense Refill  . ciprofloxacin (CIPRO) 500 MG tablet Take 1 tablet (500 mg total) by mouth 2 (two) times daily. 28 tablet 0  . clindamycin (CLEOCIN) 150 MG capsule Take 3 capsules (450 mg total) by mouth 3 (three) times daily. 90 capsule 0  . docusate sodium (COLACE) 100 MG capsule Take 100 mg by mouth 2 (two) times daily.    . feeding supplement, GLUCERNA SHAKE, (GLUCERNA SHAKE) LIQD Take 237 mLs by mouth 2 (two) times daily between meals. 60 Can 0  . ferrous sulfate 325 (65 FE) MG tablet Take 325 mg by mouth daily.    . furosemide (LASIX) 40 MG tablet Take 1 tablet (40 mg total) by mouth 2 (two) times daily. 60 tablet 5  . glimepiride (AMARYL) 2 MG tablet Take 1 mg by mouth daily with breakfast.    . hydrALAZINE (APRESOLINE) 100 MG tablet Take 50 mg by mouth 3 (three) times daily.     . isosorbide dinitrate (ISORDIL) 30 MG tablet Take  30 mg by mouth 2 (two) times daily.    . nitroGLYCERIN (NITROSTAT) 0.4 MG SL tablet Place 0.4 mg under the tongue every 5 (five) minutes as needed for chest pain.    . Omega-3 Fatty Acids (FISH OIL) 1000 MG CAPS Take 1 capsule by mouth daily.    Marland Kitchen omeprazole (PRILOSEC) 20 MG capsule Take 20 mg by mouth daily.    . potassium chloride SA (K-DUR,KLOR-CON) 20 MEQ tablet Take 1 tablet (20 mEq total) by mouth daily. 30 tablet 5  . propranolol (INDERAL) 20 MG tablet Take 40 mg by mouth 3 (three) times daily.    . ramipril (ALTACE) 5 MG capsule Take 5 mg by mouth daily.    . simvastatin (ZOCOR) 20 MG tablet Take 20 mg by mouth at bedtime.    . traMADol (ULTRAM) 50 MG tablet Take 25-50 mg by mouth 3 (three) times daily as needed for moderate pain.       ROS: History obtained from the  patient  General ROS: negative for - chills, fatigue, fever, night sweats, weight gain or weight loss Psychological ROS: as noted in HPI Ophthalmic ROS: negative for - blurry vision, double vision, eye pain or loss of vision ENT ROS: negative for - epistaxis, nasal discharge, oral lesions, sore throat, tinnitus or vertigo Allergy and Immunology ROS: negative for - hives or itchy/watery eyes Hematological and Lymphatic ROS: negative for - bleeding problems, bruising or swollen lymph nodes Endocrine ROS: negative for - galactorrhea, hair pattern changes, polydipsia/polyuria or temperature intolerance Respiratory ROS: negative for - cough, hemoptysis, shortness of breath or wheezing Cardiovascular ROS: negative for - chest pain, dyspnea on exertion, edema or irregular heartbeat Gastrointestinal ROS: negative for - abdominal pain, diarrhea, hematemesis, nausea/vomiting or stool incontinence Genito-Urinary ROS: negative for - dysuria, hematuria, incontinence or urinary frequency/urgency Musculoskeletal ROS: negative for - joint swelling or muscular weakness Neurological ROS: as noted in HPI Dermatological ROS: open wound on foot  Physical Examination: Blood pressure 199/65, pulse 81, resp. rate 21, SpO2 98 %.  HEENT-  Normocephalic, no lesions, without obvious abnormality.  Normal external eye and conjunctiva.  Normal TM's bilaterally.  Normal auditory canals and external ears. Normal external nose, mucus membranes and septum.  Normal pharynx. Neck supple with no masses, nodes, nodules or enlargement. Cardiovascular - S1, S2 normal Lungs - chest clear, no wheezing, rales, normal symmetric air entry Abdomen - soft, non-tender; bowel sounds normal; no masses,  no organomegaly Extremities - open wound on sole of right foot.  BLE edema, right greater than left.  Neurological Examination Mental Status: Alert. No oriented to time.  Knows she is in the hospital.  Speech fluent without evidence of  aphasia.  Slurring of speech.  Able to follow 3 step commands without some reinforcement. Cranial Nerves: II: Discs flat bilaterally; Visual fields grossly normal, pupils equal, round, reactive to light and accommodation III,IV, VI: ptosis not present, extra-ocular motions intact bilaterally V,VII: smile symmetric, facial light touch sensation normal bilaterally VIII: hearing normal bilaterally IX,X: gag reflex present XI: bilateral shoulder shrug XII: midline tongue extension Motor: Right : Upper extremity   5-/5    Left:     Upper extremity   3+/5  Lower extremity   3/5     Lower extremity   1-2/5 Tone and bulk:normal tone throughout; no atrophy noted Sensory: Pinprick and light touch intact throughout, bilaterally Deep Tendon Reflexes: 2+ and symmetric in the upper extremities and absent in the lower extremities Plantars: Right: upgoing  Left: upgoing Cerebellar: normal finger-to-nose and normal heel-to-shin testing bilaterally with only limitations due to strength Gait: not tested due to left sided weakness      Results for orders placed or performed during the hospital encounter of 12/03/14 (from the past 48 hour(s))  Comprehensive metabolic panel     Status: Abnormal   Collection Time: 12/03/14  2:16 PM  Result Value Ref Range   Sodium 133 (L) 135 - 145 mmol/L   Potassium 4.0 3.5 - 5.1 mmol/L   Chloride 106 101 - 111 mmol/L   CO2 21 (L) 22 - 32 mmol/L   Glucose, Bld 152 (H) 65 - 99 mg/dL   BUN 30 (H) 6 - 20 mg/dL   Creatinine, Ser 1.38 (H) 0.44 - 1.00 mg/dL   Calcium 7.7 (L) 8.9 - 10.3 mg/dL   Total Protein 6.1 (L) 6.5 - 8.1 g/dL   Albumin 2.4 (L) 3.5 - 5.0 g/dL   AST 26 15 - 41 U/L   ALT 13 (L) 14 - 54 U/L   Alkaline Phosphatase 50 38 - 126 U/L   Total Bilirubin 0.4 0.3 - 1.2 mg/dL   GFR calc non Af Amer 38 (L) >60 mL/min   GFR calc Af Amer 44 (L) >60 mL/min    Comment: (NOTE) The eGFR has been calculated using the CKD EPI equation. This calculation has not been  validated in all clinical situations. eGFR's persistently <60 mL/min signify possible Chronic Kidney Disease.    Anion gap 6 5 - 15  CBC     Status: Abnormal   Collection Time: 12/03/14  2:16 PM  Result Value Ref Range   WBC 5.8 3.6 - 11.0 K/uL   RBC 3.04 (L) 3.80 - 5.20 MIL/uL   Hemoglobin 8.3 (L) 12.0 - 16.0 g/dL   HCT 25.8 (L) 35.0 - 47.0 %   MCV 84.8 80.0 - 100.0 fL   MCH 27.2 26.0 - 34.0 pg   MCHC 32.1 32.0 - 36.0 g/dL   RDW 17.4 (H) 11.5 - 14.5 %   Platelets 257 150 - 440 K/uL  Troponin I     Status: Abnormal   Collection Time: 12/03/14  2:16 PM  Result Value Ref Range   Troponin I 0.11 (H) <0.031 ng/mL    Comment: READ BACK AND VERIFIED WITH RUTH HOUSAND 12/03/2014 1752 [facility]        PERSISTENTLY INCREASED TROPONIN VALUES IN THE RANGE OF 0.04-0.49 ng/mL CAN BE SEEN IN:       -UNSTABLE ANGINA       -CONGESTIVE HEART FAILURE       -MYOCARDITIS       -CHEST TRAUMA       -ARRYHTHMIAS       -LATE PRESENTING MYOCARDIAL INFARCTION       -COPD   CLINICAL FOLLOW-UP RECOMMENDED.   Urinalysis complete, with microscopic     Status: Abnormal   Collection Time: 12/03/14  4:10 PM  Result Value Ref Range   Color, Urine YELLOW (A) YELLOW   APPearance HAZY (A) CLEAR   Glucose, UA NEGATIVE NEGATIVE mg/dL   Bilirubin Urine NEGATIVE NEGATIVE   Ketones, ur NEGATIVE NEGATIVE mg/dL   Specific Gravity, Urine 1.016 1.005 - 1.030   Hgb urine dipstick 1+ (A) NEGATIVE   pH 5.0 5.0 - 8.0   Protein, ur 100 (A) NEGATIVE mg/dL   Nitrite NEGATIVE NEGATIVE   Leukocytes, UA NEGATIVE NEGATIVE   RBC / HPF 6-30 0 - 5 RBC/hpf   WBC, UA 0-5 0 -  5 WBC/hpf   Bacteria, UA RARE (A) NONE SEEN   Squamous Epithelial / LPF 0-5 (A) NONE SEEN   Mucous PRESENT    Hyaline Casts, UA PRESENT    Amorphous Crystal PRESENT    Ct Head Wo Contrast  12/03/2014   CLINICAL DATA:  69 year old hypertensive diabetic female with increased confusion. Recent infection of right foot with fever. Initial encounter.  EXAM: CT  HEAD WITHOUT CONTRAST  TECHNIQUE: Contiguous axial images were obtained from the base of the skull through the vertex without intravenous contrast.  COMPARISON:  06/25/2014.  FINDINGS: Since the prior CT, interval development of large broad-based complex right-sided subdural collection with maximal thickness frontal region of 1.6 cm causing mass effect upon the right lateral ventricle which is displaced posteriorly and towards the left with 1 cm of midline shift.  The right-sided complex subdural collection has an appearance most suggestive of acute on chronic subdural hematoma (although new from 06/25/2014).  Patient has a history fever. An empyema is a less likely consideration for the above described findings. If this were of high clinical concern MR would prove helpful for further delineation.  No CT evidence of large acute infarct.  Vascular calcifications  Mastoid air cells, middle ear cavities and visualized paranasal sinuses are clear.  No calvarial fracture noted.  IMPRESSION: Large broad-based right-sided subdural hematoma has developed since the prior CT. Maximal thickness 1.6 cm with midline shift to left by 1 cm.  These results were called by telephone at the time of interpretation on 12/03/2014 at 4:47 pm to Dr. Lisa Roca , who verbally acknowledged these results.   Electronically Signed   By: Genia Del M.D.   On: 12/03/2014 16:55   Assessment: 69 y.o. female presenting with complaints of left sided weakness and confusion.  Head CT performed and reviewed personally.  Head CT shows a right SDH.  There appears to be some chronic as well as acute features to the hematoma.  Patient has no history of trauma and is on no anticoagulation.  Neurosurgery aware of the patient.  Patient stable at this time.    Stroke Risk Factors - diabetes mellitus, hyperlipidemia and hypertension  Plan: 1. MRI of the brain without contrast 2. PT consult, OT consult, Speech consult 3. Sliding scale insulin 4. No  antiplatelet therapyor anticoagulation indicated at this time. 5. Clotting factors to be ordered 6. Frequent neurochecks 7. ICU monitoring 8. Neurosurgery aware of patient  Alexis Goodell, MD [facility] [phone]

## 2014-12-04 ENCOUNTER — Inpatient Hospital Stay (HOSPITAL_COMMUNITY): Payer: Medicare HMO

## 2014-12-04 LAB — GLUCOSE, CAPILLARY
GLUCOSE-CAPILLARY: 126 mg/dL — AB (ref 65–99)
GLUCOSE-CAPILLARY: 140 mg/dL — AB (ref 65–99)
GLUCOSE-CAPILLARY: 157 mg/dL — AB (ref 65–99)
Glucose-Capillary: 188 mg/dL — ABNORMAL HIGH (ref 65–99)

## 2014-12-04 LAB — WOUND CULTURE

## 2014-12-04 LAB — LIPID PANEL
CHOLESTEROL: 127 mg/dL (ref 0–200)
HDL: 32 mg/dL — AB (ref >40)
LDL Cholesterol: 84 mg/dL (ref 0–99)
Total CHOL/HDL Ratio: 4 RATIO
Triglycerides: 56 mg/dL (ref <150)
VLDL: 11 mg/dL (ref 0–40)

## 2014-12-04 MED ORDER — GLUCERNA SHAKE PO LIQD
237.0000 mL | Freq: Two times a day (BID) | ORAL | Status: DC
  Administered 2014-12-04 – 2014-12-11 (×11): 237 mL via ORAL

## 2014-12-04 NOTE — Progress Notes (Signed)
Subjective: Resting comfortably, no acute concerns. MRI brain imaging reviewed, shows previously seen right sided SDH stable from prior CT.   Evaluated by neurosurgery this morning.   Objective: Current vital signs: BP 150/66 mmHg  Pulse 78  Temp(Src) 98.6 F (37 C) (Oral)  Resp 19  SpO2 100% Vital signs in last 24 hours: Temp:  [98.2 F (36.8 C)-99.4 F (37.4 C)] 98.6 F (37 C) (08/02 0744) Pulse Rate:  [64-84] 78 (08/02 0600) Resp:  [16-23] 19 (08/02 0600) BP: (109-199)/(43-79) 150/66 mmHg (08/02 0600) SpO2:  [95 %-100 %] 100 % (08/02 0600) Weight:  [63.504 kg (140 lb)] 63.504 kg (140 lb) (08/01 1403)  Intake/Output from previous day: 08/01 0701 - 08/02 0700 In: 50 [P.O.:50] Out: 500 [Urine:500] Intake/Output this shift:   Nutritional status: Diet Carb Modified Fluid consistency:: Thin; Room service appropriate?: Yes  Cardiovascular - S1, S2 normal Lungs - chest clear, no wheezing, rales, normal symmetric air entry Abdomen - soft, non-tender; bowel sounds normal; no masses, no organomegaly Neurologic Exam: Mental Status: Alert. Not oriented to time. Knows she is in the hospital. Speech fluent without evidence of aphasia. Mild slurring of speech. Able to follow 3 step commands without some reinforcement. Cranial Nerves: II:  pupils equal, round, reactive to light  III,IV, VI: ptosis not present, extra-ocular motions intact bilaterally V,VII: smile symmetric, facial light touch sensation normal bilaterally  Motor: Right :Upper extremity 5-/5Left: Upper extremity 3+/5 Lower extremity 3/5Lower extremity 1-2/5 Tone and bulk:normal tone throughout; no atrophy noted Sensory: light touch intact throughout, bilaterally  Lab Results: Basic Metabolic Panel:  Recent Labs Lab 11/30/14 2134 12/03/14 1416  NA 135 133*  K 4.1 4.0  CL 108 106  CO2 21* 21*   GLUCOSE 136* 152*  BUN 26* 30*  CREATININE 1.28* 1.38*  CALCIUM 8.0* 7.7*    Liver Function Tests:  Recent Labs Lab 12/03/14 1416  AST 26  ALT 13*  ALKPHOS 50  BILITOT 0.4  PROT 6.1*  ALBUMIN 2.4*   No results for input(s): LIPASE, AMYLASE in the last 168 hours. No results for input(s): AMMONIA in the last 168 hours.  CBC:  Recent Labs Lab 11/30/14 2134 12/03/14 1416  WBC 6.1 5.8  NEUTROABS 4.8  --   HGB 9.3* 8.3*  HCT 28.5* 25.8*  MCV 84.5 84.8  PLT 272 257    Cardiac Enzymes:  Recent Labs Lab 12/03/14 1416  TROPONINI 0.11*    Lipid Panel:  Recent Labs Lab 12/03/14 2226  CHOL 127  TRIG 56  HDL 32*  CHOLHDL 4.0  VLDL 11  LDLCALC 84    CBG:  Recent Labs Lab 12/03/14 2248 12/03/14 2329  GLUCAP 26* 43    Microbiology: Results for orders placed or performed during the hospital encounter of 12/03/14  MRSA PCR Screening     Status: None   Collection Time: 12/03/14  7:46 PM  Result Value Ref Range Status   MRSA by PCR NEGATIVE NEGATIVE Final    Comment:        The GeneXpert MRSA Assay (FDA approved for NASAL specimens only), is one component of a comprehensive MRSA colonization surveillance program. It is not intended to diagnose MRSA infection nor to guide or monitor treatment for MRSA infections.     Coagulation Studies:  Recent Labs  12/03/14 2226  LABPROT 16.0*  INR 1.27    Imaging: Ct Head Wo Contrast  12/03/2014   CLINICAL DATA:  69 year old hypertensive diabetic female with increased confusion. Recent infection of right  foot with fever. Initial encounter.  EXAM: CT HEAD WITHOUT CONTRAST  TECHNIQUE: Contiguous axial images were obtained from the base of the skull through the vertex without intravenous contrast.  COMPARISON:  06/25/2014.  FINDINGS: Since the prior CT, interval development of large broad-based complex right-sided subdural collection with maximal thickness frontal region of 1.6 cm causing mass effect upon the  right lateral ventricle which is displaced posteriorly and towards the left with 1 cm of midline shift.  The right-sided complex subdural collection has an appearance most suggestive of acute on chronic subdural hematoma (although new from 06/25/2014).  Patient has a history fever. An empyema is a less likely consideration for the above described findings. If this were of high clinical concern MR would prove helpful for further delineation.  No CT evidence of large acute infarct.  Vascular calcifications  Mastoid air cells, middle ear cavities and visualized paranasal sinuses are clear.  No calvarial fracture noted.  IMPRESSION: Large broad-based right-sided subdural hematoma has developed since the prior CT. Maximal thickness 1.6 cm with midline shift to left by 1 cm.  These results were called by telephone at the time of interpretation on 12/03/2014 at 4:47 pm to Dr. Lisa Roca , who verbally acknowledged these results.   Electronically Signed   By: Genia Del M.D.   On: 12/03/2014 16:55   Mr Brain Wo Contrast  12/04/2014   CLINICAL DATA:  70-week history of intermittent altered mental status, confusion and weakness. Followup subdural hematoma. History of diabetes, hypertension, neuropathy.  EXAM: MRI HEAD WITHOUT CONTRAST  TECHNIQUE: Multiplanar, multiecho pulse sequences of the brain and surrounding structures were obtained without intravenous contrast.  COMPARISON:  CT head December 03, 2014  FINDINGS: 18 mm RIGHT holo hemispheric T2 bright, intermediate to bright T1 subdural hematoma results in 7 mm RIGHT to LEFT midline shift, partially effaced RIGHT lateral ventricle without hydrocephalus or ventricular entrapment. A few scattered subcentimeter supratentorial white matter T2 hyperintensities compatible chronic small vessel ischemic disease, less than expected for age. No susceptibility artifact to suggest intraparenchymal blood products.  Normal major intracranial vascular flow voids noted at the skull  base. Status post RIGHT ocular lens implant. Paranasal sinuses and mastoid air cells are well aerated. No abnormal sellar expansion. No cerebellar tonsillar ectopia. No suspicious calvarial bone marrow signal.  IMPRESSION: 18 mm RIGHT holohemispheric early subacute subdural hematoma resulting in 7 mm RIGHT to LEFT midline shift, stable from yesterday's CT.  No acute ischemia.   Electronically Signed   By: Elon Alas M.D.   On: 12/04/2014 01:56    Medications:  Scheduled: . docusate sodium  100 mg Oral BID  . ferrous sulfate  325 mg Oral Daily  . furosemide  40 mg Oral BID  . glimepiride  1 mg Oral Q breakfast  . insulin aspart  0-15 Units Subcutaneous TID WC  . isosorbide dinitrate  30 mg Oral BID  . pantoprazole  40 mg Oral Daily  . potassium chloride SA  20 mEq Oral Daily  . propranolol  40 mg Oral TID  . ramipril  5 mg Oral Daily  . simvastatin  20 mg Oral QHS    Assessment/Plan:  69 y.o. female presenting with complaints of left sided weakness and confusion. Head CT performed and reviewed personally. Head CT shows a right SDH. There appears to be some chronic as well as acute features to the hematoma. Patient has no history of trauma and is on no anticoagulation.  -appreciate neurosurgery input, tentative plan  for surgery tomorrow -Rehab evaluation pending once surgery completed -hold all antiplatelet and anticoagulation -frequent neurochecks -will continue to monitor closely in the ICU   LOS: 1 day   Jim Like, DO [facility] [phone]  If 7pm- 7am, please page neurology on call as listed in [place]. 12/04/2014  8:02 AM

## 2014-12-04 NOTE — Progress Notes (Signed)
EEG completed; results pending.    

## 2014-12-04 NOTE — Progress Notes (Signed)
Utilization review completed. Kateena Mehdi Gironda, RN, BSN. 

## 2014-12-04 NOTE — Progress Notes (Signed)
OT Cancellation Note  Patient Details Name: [name] MRN: [id number] DOB: 11/12/1945   Cancelled Treatment:    Reason Eval/Treat Not Completed: Other (comment) Pt currently on bedrest. Please update activity orders when appropriate to begin therapy. thanks Archuleta, OTR/L  [phone] 12/04/2014 12/04/2014, 4:58 PM

## 2014-12-04 NOTE — Procedures (Signed)
History: 69 yo F with left sided weakness  Sedation: None  Technique: This is a 19 channel routine scalp EEG performed at the bedside with bipolar and monopolar montages arranged in accordance to the international 10/20 system of electrode placement. One channel was dedicated to EKG recording.    Background: The background consists of intermixed alpha and beta activities. There is a well defined posterior dominant rhythm of 8.5 Hz that attenuates with eye opening. Though the PDR is seen bilaterally, all faster frequencies are attenuated on the right compared to the left.   Photic stimulation: Physiologic driving is not performed  EEG Abnormalities: 1) Attenuation of faster frequencies on the left.   Clinical Interpretation: This EEG is consistent with cortical dysfunction of the right hemisphere as can be seen with extraaxial fluid collections. There was no seizure or seizure predisposition recorded on this study.   Roland Rack, MD [facility] [phone]  If 7pm- 7am, please page neurology on call as listed in [place].

## 2014-12-04 NOTE — Consult Note (Signed)
Reason for Consult: Right subdural hematoma Referring Physician: Dr. Joseph Pierini is an 69 y.o. female.  HPI: The patient is a 69 year old black female who's had some mental status changes intermittently over last couple of weeks. She was seen at the [facility] ER and a CT scan was obtained. This demonstrated a right subdural hematoma. The patient was admitted by neurology and worked up further with a brain MRI which again demonstrated a right subdural hematoma. It was no evidence of stroke. A neurosurgical consultation has been requested.  Presently the patient is alert and pleasant. She admits to a mild headache. She denies neck pain.  Past Medical History  Diagnosis Date  . Hypertension   . Diabetes mellitus without complication   . Anemia   . Coronary artery disease   . CHF (congestive heart failure)   . Glaucoma   . Hyperlipidemia   . Chronic kidney disease   . Neuropathy   . Chronic anemia     Past Surgical History  Procedure Laterality Date  . Cardiac catheterization    . Esophagogastroduodenoscopy (egd) with propofol N/A 09/24/2014    Elliott-gastritis & normal Billroth II changes   . Colonoscopy with propofol N/A 09/24/2014    Elliott-Incomplete secondary to prep  . Coronary angioplasty  2016    stent placed  . Bilroth ii procedure    . Colonoscopy with propofol N/A 11/12/2014    Procedure: COLONOSCOPY WITH PROPOFOL;  Surgeon: Manya Silvas, MD;  Location: [facility] ENDOSCOPY;  Service: Endoscopy;  Laterality: N/A;    Family History  Problem Relation Age of Onset  . Diabetes Mother   . Heart disease Father   . Diabetes Sister   . Lung cancer Brother   . Cancer Brother   . Diabetes Brother   . Diabetes Sister     Social History:  reports that she has quit smoking. She has never used smokeless tobacco. She reports that she does not drink alcohol or use illicit drugs.  Allergies:  Allergies  Allergen Reactions  . Codeine Other  (See Comments)    Reaction:  Hallucinations    Medications:  I have reviewed the patient's current medications. Prior to Admission:  Prescriptions prior to admission  Medication Sig Dispense Refill Last Dose  . ciprofloxacin (CIPRO) 500 MG tablet Take 1 tablet (500 mg total) by mouth 2 (two) times daily. 28 tablet 0 12/03/2014 at Unknown time  . clindamycin (CLEOCIN) 150 MG capsule Take 3 capsules (450 mg total) by mouth 3 (three) times daily. 90 capsule 0 12/03/2014 at Unknown time  . docusate sodium (COLACE) 100 MG capsule Take 100 mg by mouth 2 (two) times daily.   12/03/2014 at Unknown time  . feeding supplement, GLUCERNA SHAKE, (GLUCERNA SHAKE) LIQD Take 237 mLs by mouth 2 (two) times daily between meals. 60 Can 0 Past Week at Unknown time  . ferrous sulfate 325 (65 FE) MG tablet Take 325 mg by mouth daily.   12/03/2014 at Unknown time  . furosemide (LASIX) 40 MG tablet Take 1 tablet (40 mg total) by mouth 2 (two) times daily. 60 tablet 5 12/03/2014 at Unknown time  . glimepiride (AMARYL) 2 MG tablet Take 1 mg by mouth daily with breakfast.   12/03/2014 at Unknown time  . hydrALAZINE (APRESOLINE) 100 MG tablet Take 50 mg by mouth 3 (three) times daily.    12/03/2014 at Unknown time  . isosorbide dinitrate (ISORDIL) 30 MG tablet Take 30 mg by mouth 2 (  two) times daily.   12/03/2014 at Unknown time  . nitroGLYCERIN (NITROSTAT) 0.4 MG SL tablet Place 0.4 mg under the tongue every 5 (five) minutes as needed for chest pain.   PRN at PRN  . Omega-3 Fatty Acids (FISH OIL) 1000 MG CAPS Take 1 capsule by mouth daily.   12/03/2014 at Unknown time  . omeprazole (PRILOSEC) 20 MG capsule Take 20 mg by mouth daily.   12/03/2014 at Unknown time  . potassium chloride SA (K-DUR,KLOR-CON) 20 MEQ tablet Take 1 tablet (20 mEq total) by mouth daily. 30 tablet 5 12/03/2014 at Unknown time  . propranolol (INDERAL) 20 MG tablet Take 40 mg by mouth 3 (three) times daily.   12/03/2014 at 0730  . ramipril (ALTACE) 5 MG capsule Take 5 mg  by mouth daily.   12/03/2014 at Unknown time  . simvastatin (ZOCOR) 20 MG tablet Take 20 mg by mouth at bedtime.   12/02/2014 at Unknown time  . traMADol (ULTRAM) 50 MG tablet Take 25-50 mg by mouth 3 (three) times daily as needed for moderate pain.    PRN at PRN   Scheduled: . docusate sodium  100 mg Oral BID  . ferrous sulfate  325 mg Oral Daily  . furosemide  40 mg Oral BID  . glimepiride  1 mg Oral Q breakfast  . insulin aspart  0-15 Units Subcutaneous TID WC  . isosorbide dinitrate  30 mg Oral BID  . pantoprazole  40 mg Oral Daily  . potassium chloride SA  20 mEq Oral Daily  . propranolol  40 mg Oral TID  . ramipril  5 mg Oral Daily  . simvastatin  20 mg Oral QHS   Continuous:  XBD:[id number] **OR** acetaminophen, labetalol Anti-infectives    None       Results for orders placed or performed during the hospital encounter of 12/03/14 (from the past 48 hour(s))  MRSA PCR Screening     Status: None   Collection Time: 12/03/14  7:46 PM  Result Value Ref Range   MRSA by PCR NEGATIVE NEGATIVE    Comment:        The GeneXpert MRSA Assay (FDA approved for NASAL specimens only), is one component of a comprehensive MRSA colonization surveillance program. It is not intended to diagnose MRSA infection nor to guide or monitor treatment for MRSA infections.   Protime-INR     Status: Abnormal   Collection Time: 12/03/14 10:26 PM  Result Value Ref Range   Prothrombin Time 16.0 (H) 11.6 - 15.2 seconds   INR 1.27 0.00 - 1.49  APTT     Status: None   Collection Time: 12/03/14 10:26 PM  Result Value Ref Range   aPTT 35 24 - 37 seconds  Lipid panel     Status: Abnormal   Collection Time: 12/03/14 10:26 PM  Result Value Ref Range   Cholesterol 127 0 - 200 mg/dL   Triglycerides 56 <150 mg/dL   HDL 32 (L) >40 mg/dL   Total CHOL/HDL Ratio 4.0 RATIO   VLDL 11 0 - 40 mg/dL   LDL Cholesterol 84 0 - 99 mg/dL    Comment:        Total Cholesterol/HDL:CHD Risk Coronary Heart Disease  Risk Table                     Men   Women  1/2 Average Risk   3.4   3.3  Average Risk       5.0  4.4  2 X Average Risk   9.6   7.1  3 X Average Risk  23.4   11.0        Use the calculated Patient Ratio above and the CHD Risk Table to determine the patient's CHD Risk.        ATP III CLASSIFICATION (LDL):  <100     mg/dL   Optimal  100-129  mg/dL   Near or Above                    Optimal  130-159  mg/dL   Borderline  160-189  mg/dL   High  >190     mg/dL   Very High   Glucose, capillary     Status: Abnormal   Collection Time: 12/03/14 10:48 PM  Result Value Ref Range   Glucose-Capillary 63 (L) 65 - 99 mg/dL  Glucose, capillary     Status: None   Collection Time: 12/03/14 11:29 PM  Result Value Ref Range   Glucose-Capillary 76 65 - 99 mg/dL    Ct Head Wo Contrast  12/03/2014   CLINICAL DATA:  69 year old hypertensive diabetic female with increased confusion. Recent infection of right foot with fever. Initial encounter.  EXAM: CT HEAD WITHOUT CONTRAST  TECHNIQUE: Contiguous axial images were obtained from the base of the skull through the vertex without intravenous contrast.  COMPARISON:  06/25/2014.  FINDINGS: Since the prior CT, interval development of large broad-based complex right-sided subdural collection with maximal thickness frontal region of 1.6 cm causing mass effect upon the right lateral ventricle which is displaced posteriorly and towards the left with 1 cm of midline shift.  The right-sided complex subdural collection has an appearance most suggestive of acute on chronic subdural hematoma (although new from 06/25/2014).  Patient has a history fever. An empyema is a less likely consideration for the above described findings. If this were of high clinical concern MR would prove helpful for further delineation.  No CT evidence of large acute infarct.  Vascular calcifications  Mastoid air cells, middle ear cavities and visualized paranasal sinuses are clear.  No calvarial  fracture noted.  IMPRESSION: Large broad-based right-sided subdural hematoma has developed since the prior CT. Maximal thickness 1.6 cm with midline shift to left by 1 cm.  These results were called by telephone at the time of interpretation on 12/03/2014 at 4:47 pm to Dr. Lisa Roca , who verbally acknowledged these results.   Electronically Signed   By: Genia Del M.D.   On: 12/03/2014 16:55   Mr Brain Wo Contrast  12/04/2014   CLINICAL DATA:  Three-week history of intermittent altered mental status, confusion and weakness. Followup subdural hematoma. History of diabetes, hypertension, neuropathy.  EXAM: MRI HEAD WITHOUT CONTRAST  TECHNIQUE: Multiplanar, multiecho pulse sequences of the brain and surrounding structures were obtained without intravenous contrast.  COMPARISON:  CT head December 03, 2014  FINDINGS: 18 mm RIGHT holo hemispheric T2 bright, intermediate to bright T1 subdural hematoma results in 7 mm RIGHT to LEFT midline shift, partially effaced RIGHT lateral ventricle without hydrocephalus or ventricular entrapment. A few scattered subcentimeter supratentorial white matter T2 hyperintensities compatible chronic small vessel ischemic disease, less than expected for age. No susceptibility artifact to suggest intraparenchymal blood products.  Normal major intracranial vascular flow voids noted at the skull base. Status post RIGHT ocular lens implant. Paranasal sinuses and mastoid air cells are well aerated. No abnormal sellar expansion. No cerebellar tonsillar ectopia. No suspicious calvarial bone marrow  signal.  IMPRESSION: 18 mm RIGHT holohemispheric early subacute subdural hematoma resulting in 7 mm RIGHT to LEFT midline shift, stable from yesterday's CT.  No acute ischemia.   Electronically Signed   By: Elon Alas M.D.   On: 12/04/2014 01:56    ROS: As above Blood pressure 150/66, pulse 78, temperature 98.8 F (37.1 C), temperature source Oral, resp. rate 19, SpO2 100 %. Physical  Exam  General: An alert and pleasant 69 year old black female in no apparent distress.  HEENT: Normocephalic, atraumatic, her pupils are approximately 2-1/2 mm on the left and 2 mm on the right. Extraocular muscles are intact.  Neck: Supple without masses or deformities. Spurling's testing is negative.  Thorax: Symmetric  Abdomen: Soft  Back exam: Unremarkable  Extremities: The patient has a draining wound on her right foot.  Neurologic exam: The patient is alert and oriented 2, person and [facility]. She could tell me the year but not the month. Glasgow Coma Scale 14. The patient's speech is normal. Cranial nerves II through XII were examined bilaterally and grossly normal. Vision and hearing are grossly normal bilaterally. The patient's strength is normal in her right extremities. She has weakness on the left more so in the upper extremity than the lower extremity at approximately 4 over 5. Cerebellar function is intact and rapid alternate movements of the right upper extremity. Some apraxia in the left hand. Sensation is normal to light touch sensation all tested dermatomes bilaterally.  I reviewed the patient's head CT performed yesterday and brain MRI performed at [facility]. They demonstrate a right subdural hematoma with midline shift.  Assessment/Plan: Right subdural hematoma: I discussed the situation with the patient. We have discussed the various treatment options including observation versus surgery. I think she will need surgery. I described the surgical treatment option of right bur holes versus craniotomy for evacuation of the subdural hematoma. We have discussed the risks including risks of anesthesia, recurrent subdural hematoma, seizures, infection, medical risks, etc. I have answered all her questions. She wants to proceed with surgery. We will tentatively plan to do it tomorrow.  Anemia: Noted. She may need blood. I will type and cross  her.  Kanoe Wanner D 12/04/2014, 7:42 AM

## 2014-12-04 NOTE — Progress Notes (Addendum)
Initial Nutrition Assessment  DOCUMENTATION CODES:   Not applicable  INTERVENTION:   Glucerna Shake po BID, each supplement provides 220 kcal and 10 grams of protein  NUTRITION DIAGNOSIS:   Increased nutrient needs related to wound healing (pending post op state) as evidenced by estimated needs  GOAL:   Patient will meet greater than or equal to 90% of their needs  MONITOR:   PO intake, Supplement acceptance, Labs, Weight trends, Skin, I & O's  REASON FOR ASSESSMENT:   Malnutrition Screening Tool  ASSESSMENT:   69 y.o. Female with mental status changes intermittently over last couple of weeks. She was seen at the [facility] ER and a CT scan was obtained. This demonstrated a right subdural hematoma. The patient was admitted by neurology and worked up further with a brain MRI which again demonstrated a right subdural hematoma. It was no evidence of stroke.  S/p EEG 8/2 -- consistent with cortical dysfunction of R hemisphere.  RD unable to obtain nutrition hx or complete Nutrition Focused Physical Exam.  Pt sleeping soundly upon visit; unable to wake.  Noted pt for surgery 8/3.  Pt with increased nutrient needs given wound healing, pending post up state.  RD to add oral nutrition supplements at this time.  Diet Order:  Diet Carb Modified Fluid consistency:: Thin; Room service appropriate?: Yes  Skin:  Wound (see comment) (Stage III to sacrum; DTI to L foot)  Last BM:  7/31  Height:   Ht Readings from Last 1 Encounters:  12/03/14 5\' 7"  (1.702 m)    Weight:   Wt Readings from Last 1 Encounters:  12/03/14 140 lb (63.504 kg)    Ideal Body Weight:  61.3 kg  BMI:  21.7 kg/m2  Estimated Nutritional Needs:   Kcal:  1600-1800  Protein:  80-90 gm  Fluid:  1.6-1.8 L  CBG (last 3)   Recent Labs  12/03/14 2248 12/03/14 2329 12/04/14 1120  GLUCAP 63* 76 140*    EDUCATION NEEDS:   No education needs identified at this time  Arthur Holms, RD, LDN Pager #: [phone] After-Hours Pager #: [phone]

## 2014-12-04 NOTE — Progress Notes (Signed)
SLP Cancellation Note  Patient Details Name: [name] MRN: [id number] DOB: 1945-12-25   Cancelled treatment:       Reason Eval/Treat Not Completed: Orders received for SLP evaluation speech/lang/cognition.  Pt scheduled for surgery tomorrow for SDH - will defer assessment until after surgery.   Juan Quam Laurice 12/04/2014, 4:18 PM

## 2014-12-04 NOTE — Progress Notes (Signed)
Physical Therapy Discharge Patient Details Name: [name] MRN: [id number] DOB: 10-25-1945 Today's Date: 12/04/2014 Time:  -     Patient discharged from PT services secondary to pt currently on bedrest and to have surgery 8/3 (per neurosurgery note),. . Please re-order PT post-op when medically appropriate.  Thank you    [name]  12/04/2014, 8:07 AM  Pager [phone]

## 2014-12-05 ENCOUNTER — Other Ambulatory Visit: Payer: Self-pay | Admitting: Neurosurgery

## 2014-12-05 ENCOUNTER — Inpatient Hospital Stay (HOSPITAL_COMMUNITY): Payer: Medicare HMO

## 2014-12-05 DIAGNOSIS — L8990 Pressure ulcer of unspecified site, unspecified stage: Secondary | ICD-10-CM | POA: Insufficient documentation

## 2014-12-05 LAB — BASIC METABOLIC PANEL
Anion gap: 9 (ref 5–15)
BUN: 23 mg/dL — AB (ref 6–20)
CO2: 20 mmol/L — ABNORMAL LOW (ref 22–32)
CREATININE: 1.16 mg/dL — AB (ref 0.44–1.00)
Calcium: 7.8 mg/dL — ABNORMAL LOW (ref 8.9–10.3)
Chloride: 106 mmol/L (ref 101–111)
GFR calc non Af Amer: 47 mL/min — ABNORMAL LOW (ref >60)
GFR, EST AFRICAN AMERICAN: 54 mL/min — AB (ref >60)
Glucose, Bld: 174 mg/dL — ABNORMAL HIGH (ref 65–99)
Potassium: 4.8 mmol/L (ref 3.5–5.1)
Sodium: 135 mmol/L (ref 135–145)

## 2014-12-05 LAB — HEMOGLOBIN A1C
HEMOGLOBIN A1C: 4.8 % (ref 4.8–5.6)
Mean Plasma Glucose: 91 mg/dL

## 2014-12-05 LAB — GLUCOSE, CAPILLARY
Glucose-Capillary: 128 mg/dL — ABNORMAL HIGH (ref 65–99)
Glucose-Capillary: 169 mg/dL — ABNORMAL HIGH (ref 65–99)
Glucose-Capillary: 94 mg/dL (ref 65–99)
Glucose-Capillary: 126 mg/dL — ABNORMAL HIGH (ref 65–99)
Glucose-Capillary: 111 mg/dL — ABNORMAL HIGH (ref 65–99)

## 2014-12-05 LAB — CBC
HCT: 24.9 % — ABNORMAL LOW (ref 36.0–46.0)
Hemoglobin: 8.3 g/dL — ABNORMAL LOW (ref 12.0–15.0)
MCH: 26.7 pg (ref 26.0–34.0)
MCHC: 33.3 g/dL (ref 30.0–36.0)
MCV: 80.1 fL (ref 78.0–100.0)
Platelets: 224 10*3/uL (ref 150–400)
RBC: 3.11 MIL/uL — ABNORMAL LOW (ref 3.87–5.11)
RDW: 19 % — ABNORMAL HIGH (ref 11.5–15.5)
WBC: 6.2 10*3/uL (ref 4.0–10.5)

## 2014-12-05 MED ORDER — PIPERACILLIN-TAZOBACTAM 3.375 G IVPB
3.3750 g | Freq: Three times a day (TID) | INTRAVENOUS | Status: DC
  Administered 2014-12-05 – 2014-12-11 (×16): 3.375 g via INTRAVENOUS
  Filled 2014-12-05 (×19): qty 50

## 2014-12-05 MED ORDER — HYDRALAZINE HCL 20 MG/ML IJ SOLN
5.0000 mg | INTRAMUSCULAR | Status: DC | PRN
  Administered 2014-12-05 – 2014-12-12 (×2): 5 mg via INTRAVENOUS
  Filled 2014-12-05 (×2): qty 1

## 2014-12-05 MED ORDER — VANCOMYCIN HCL 10 G IV SOLR
1250.0000 mg | Freq: Once | INTRAVENOUS | Status: AC
  Administered 2014-12-05: 1250 mg via INTRAVENOUS
  Filled 2014-12-05: qty 1250

## 2014-12-05 MED ORDER — SODIUM CHLORIDE 0.9 % IV SOLN
500.0000 mg | Freq: Two times a day (BID) | INTRAVENOUS | Status: DC
  Administered 2014-12-06 – 2014-12-07 (×4): 500 mg via INTRAVENOUS
  Filled 2014-12-05 (×7): qty 500

## 2014-12-05 NOTE — Progress Notes (Signed)
OT Cancellation Note  Patient Details Name: [name] MRN: [id number] DOB: May 04, 1946   Cancelled Treatment:    Reason Eval/Treat Not Completed: Patient not medically ready - Pt with active bedrest orders.  Will initiate OT once activity increased   Azalea Cedar, [place], OTR/L [phone]  12/05/2014, 10:45 AM

## 2014-12-05 NOTE — Progress Notes (Signed)
[facility] for vancomycin and zosyn Indication: HCAP  Allergies  Allergen Reactions  . Codeine Other (See Comments)    Reaction:  Hallucinations    Patient Measurements:   Vital Signs: Temp: 99.5 F (37.5 C) (08/03 2000) Temp Source: Oral (08/03 2000) BP: 138/57 mmHg (08/03 1900) Pulse Rate: 89 (08/03 1800) Intake/Output from previous day: 08/02 0701 - 08/03 0700 In: -  Out: 250 [Urine:250] Intake/Output from this shift:    Labs:  Recent Labs  12/03/14 1416 12/05/14 1120 12/05/14 1835  WBC 5.8  --  6.2  HGB 8.3*  --  8.3*  PLT 257  --  224  CREATININE 1.38* 1.16*  --    Estimated Creatinine Clearance: 44.5 mL/min (by C-G formula based on Cr of 1.16). No results for input(s): VANCOTROUGH, VANCOPEAK, VANCORANDOM, GENTTROUGH, GENTPEAK, GENTRANDOM, TOBRATROUGH, TOBRAPEAK, TOBRARND, AMIKACINPEAK, AMIKACINTROU, AMIKACIN in the last 72 hours.   Microbiology: Recent Results (from the past 720 hour(s))  Blood culture (routine x 2)     Status: None (Preliminary result)   Collection Time: 11/30/14  9:34 PM  Result Value Ref Range Status   Specimen Description BLOOD RIGHT HAND  Final   Special Requests BOTTLES DRAWN AEROBIC AND ANAEROBIC 2CC  Final   Culture NO GROWTH 4 DAYS  Final   Report Status PENDING  Incomplete  Blood culture (routine x 2)     Status: None (Preliminary result)   Collection Time: 11/30/14  9:34 PM  Result Value Ref Range Status   Specimen Description BLOOD LEFT ASSIST CONTROL  Final   Special Requests BOTTLES DRAWN AEROBIC AND ANAEROBIC 2CC  Final   Culture NO GROWTH 4 DAYS  Final   Report Status PENDING  Incomplete  Wound culture     Status: None   Collection Time: 11/30/14  9:34 PM  Result Value Ref Range Status   Specimen Description FOOT  Final   Special Requests NONE  Final   Gram Stain   Final    FEW WBC SEEN MANY GRAM POSITIVE RODS MODERATE GRAM POSITIVE COCCI IN CLUSTERS IN PAIRS FEW GRAM NEGATIVE  RODS    Culture   Final    HEAVY GROWTH ENTEROBACTER CLOACAE HEAVY GROWTH KLEBSIELLA PNEUMONIAE WITH NORMAL SKIN FLORA    Report Status 12/04/2014 FINAL  Final   Organism ID, Bacteria KLEBSIELLA PNEUMONIAE  Final   Organism ID, Bacteria ENTEROBACTER CLOACAE  Final      Susceptibility   Enterobacter cloacae - MIC*    PIP/TAZO Value in next row Sensitive      SENSITIVE<=4    CEFTAZIDIME Value in next row Sensitive      SENSITIVE<=1    CEFEPIME Value in next row Sensitive      SENSITIVE<=1    CEFTRIAXONE Value in next row Sensitive      SENSITIVE<=1    CIPROFLOXACIN Value in next row Sensitive      SENSITIVE<=0.25    GENTAMICIN Value in next row Sensitive      SENSITIVE<=1    IMIPENEM Value in next row Sensitive      SENSITIVE0.5    TRIMETH/SULFA Value in next row Sensitive      SENSITIVE<=20    CEFAZOLIN Value in next row Resistant      RESISTANT>=64    * HEAVY GROWTH ENTEROBACTER CLOACAE   Klebsiella pneumoniae - MIC*    AMPICILLIN Value in next row Resistant      RESISTANT>=64    CEFTAZIDIME Value in next row Sensitive  RESISTANT>=64    CEFAZOLIN Value in next row Sensitive      RESISTANT>=64    CEFTRIAXONE Value in next row Sensitive      RESISTANT>=64    CIPROFLOXACIN Value in next row Sensitive      RESISTANT>=64    GENTAMICIN Value in next row Sensitive      RESISTANT>=64    IMIPENEM Value in next row Sensitive      RESISTANT>=64    TRIMETH/SULFA Value in next row Sensitive      RESISTANT>=64    PIP/TAZO Value in next row Sensitive      SENSITIVE<=4    * HEAVY GROWTH KLEBSIELLA PNEUMONIAE  MRSA PCR Screening     Status: None   Collection Time: 12/03/14  7:46 PM  Result Value Ref Range Status   MRSA by PCR NEGATIVE NEGATIVE Final    Comment:        The GeneXpert MRSA Assay (FDA approved for NASAL specimens only), is one component of a comprehensive MRSA colonization surveillance program. It is not intended to diagnose MRSA infection nor to guide  or monitor treatment for MRSA infections.     Medical History: Past Medical History  Diagnosis Date  . Hypertension   . Diabetes mellitus without complication   . Anemia   . Coronary artery disease   . CHF (congestive heart failure)   . Glaucoma   . Hyperlipidemia   . Chronic kidney disease   . Neuropathy   . Chronic anemia     Assessment: 69 year old female presented with weakness and confusion, right SDH found on CT. Patient now febrile (101.2), normal wbc, right infiltrate on CXR. Orders to start IV vancomycin and zosyn.   Goal of Therapy:  Vancomycin trough level 15-20 mcg/ml  Plan:  Measure antibiotic drug levels at steady state Follow up culture results Vancomycin 1250mg  IV x1 then 500mg  q12 hours  Zosyn 3.375g IV q8 hours  Erin Hearing PharmD., BCPS Clinical Pharmacist Pager [phone] 12/05/2014 9:35 PM

## 2014-12-05 NOTE — Progress Notes (Signed)
Patient ID: [name], female   DOB: 1945-06-19, 69 y.o.   MRN: [id number] Subjective:  The patient is alert and pleasant. She is in no apparent distress. She denies pain.  Objective: Vital signs in last 24 hours: Temp:  [99.2 F (37.3 C)-101.2 F (38.4 C)] 101.2 F (38.4 C) (08/03 1600) Pulse Rate:  [38-138] 85 (08/03 1637) Resp:  [10-32] 28 (08/03 1600) BP: (131-200)/(33-111) 160/63 mmHg (08/03 1600) SpO2:  [73 %-100 %] 100 % (08/03 1600)  Intake/Output from previous day: 08/02 0701 - 08/03 0700 In: -  Out: 250 [Urine:250] Intake/Output this shift:    Physical exam the patient is alert and oriented 3. [facility] Her speech is normal. She is mildly weak on the left without change.  Lab Results:  Recent Labs  12/03/14 1416  WBC 5.8  HGB 8.3*  HCT 25.8*  PLT 257   BMET  Recent Labs  12/03/14 1416 12/05/14 1120  NA 133* 135  K 4.0 4.8  CL 106 106  CO2 21* 20*  GLUCOSE 152* 174*  BUN 30* 23*  CREATININE 1.38* 1.16*  CALCIUM 7.7* 7.8*    Studies/Results: Mr Brain Wo Contrast  12/04/2014   CLINICAL DATA:  Three-week history of intermittent altered mental status, confusion and weakness. Followup subdural hematoma. History of diabetes, hypertension, neuropathy.  EXAM: MRI HEAD WITHOUT CONTRAST  TECHNIQUE: Multiplanar, multiecho pulse sequences of the brain and surrounding structures were obtained without intravenous contrast.  COMPARISON:  CT head December 03, 2014  FINDINGS: 18 mm RIGHT holo hemispheric T2 bright, intermediate to bright T1 subdural hematoma results in 7 mm RIGHT to LEFT midline shift, partially effaced RIGHT lateral ventricle without hydrocephalus or ventricular entrapment. A few scattered subcentimeter supratentorial white matter T2 hyperintensities compatible chronic small vessel ischemic disease, less than expected for age. No susceptibility artifact to suggest intraparenchymal blood products.  Normal major intracranial  vascular flow voids noted at the skull base. Status post RIGHT ocular lens implant. Paranasal sinuses and mastoid air cells are well aerated. No abnormal sellar expansion. No cerebellar tonsillar ectopia. No suspicious calvarial bone marrow signal.  IMPRESSION: 18 mm RIGHT holohemispheric early subacute subdural hematoma resulting in 7 mm RIGHT to LEFT midline shift, stable from yesterday's CT.  No acute ischemia.   Electronically Signed   By: Elon Alas M.D.   On: 12/04/2014 01:56    Assessment/Plan: Right subdural hematoma: I have discussed the situation again with the patient today and I spoke with the patient's son via the telephone yesterday. We have discussed the various treatment options including surgery. I have recommended that she undergo a right bur holes versus craniotomy for evacuation of subdural hematoma. I have answered all her questions regarding surgery. We have discussed the risks, benefits, and alternatives. She wants to proceed with surgery. We will plan to do it as scheduled tomorrow afternoon.  LOS: 2 days     [name] D 12/05/2014, 6:19 PM

## 2014-12-05 NOTE — Progress Notes (Signed)
SLP Cancellation Note  Patient Details Name: [name] MRN: [id number] DOB: Feb 12, 1946   Cancelled treatment:       Reason Eval/Treat Not Completed: Pt now tentatively scheduled for surgery tomorrow. Will continue to follow for cognitive-linguistic evaluation after procedure has been completed.   Germain Osgood, M.A. CCC-SLP [phone]  Germain Osgood 12/05/2014, 11:31 AM

## 2014-12-05 NOTE — Progress Notes (Signed)
Progress Note Addendum: Patient febrile today.  CBC shows normal WBC count.  CXR independently reviewed and shows what appears to be a right infiltrate.  Hospital acquired pneumonia suspected.  Patient to be started on Zosyn and Vanc to be dosed by pharmacy.  Alexis Goodell, MD [facility] [phone]

## 2014-12-05 NOTE — Progress Notes (Signed)
Subjective: Resting comfortably, no acute concerns. BP elevated overnight.   Tentatively scheduled for surgery tomorrow  Objective: Current vital signs: BP 165/63 mmHg  Pulse 85  Temp(Src) 99.2 F (37.3 C) (Oral)  Resp 23  SpO2 97% Vital signs in last 24 hours: Temp:  [98.5 F (36.9 C)-99.6 F (37.6 C)] 99.2 F (37.3 C) (08/03 0400) Pulse Rate:  [33-138] 85 (08/03 0730) Resp:  [10-32] 23 (08/03 0730) BP: (139-210)/(33-118) 165/63 mmHg (08/03 0730) SpO2:  [73 %-100 %] 97 % (08/03 0730)  Intake/Output from previous day: 08/02 0701 - 08/03 0700 In: -  Out: 250 [Urine:250] Intake/Output this shift:   Nutritional status: Diet Carb Modified Fluid consistency:: Thin; Room service appropriate?: Yes  Cardiovascular - S1, S2 normal Lungs - chest clear, no wheezing, rales, normal symmetric air entry Abdomen - soft, non-tender; bowel sounds normal; no masses, no organomegaly Neurologic Exam: Mental Status: Alert. Not oriented to time. Knows she is in the hospital. Speech fluent without evidence of aphasia. Mild slurring of speech. Able to follow 3 step commands without some reinforcement. Cranial Nerves: II:  pupils equal, round, reactive to light  III,IV, VI: ptosis not present, extra-ocular motions intact bilaterally V,VII: smile symmetric, facial light touch sensation normal bilaterally  Motor: Right :Upper extremity 5-/5Left: Upper extremity 3/5 Lower extremity 3/5Lower extremity 1-2/5 Tone and bulk:normal tone throughout; no atrophy noted Sensory: light touch intact throughout, bilaterally  Lab Results: Basic Metabolic Panel:  Recent Labs Lab 11/30/14 2134 12/03/14 1416  NA 135 133*  K 4.1 4.0  CL 108 106  CO2 21* 21*  GLUCOSE 136* 152*  BUN 26* 30*  CREATININE 1.28* 1.38*  CALCIUM 8.0* 7.7*    Liver Function Tests:  Recent Labs Lab  12/03/14 1416  AST 26  ALT 13*  ALKPHOS 50  BILITOT 0.4  PROT 6.1*  ALBUMIN 2.4*   No results for input(s): LIPASE, AMYLASE in the last 168 hours. No results for input(s): AMMONIA in the last 168 hours.  CBC:  Recent Labs Lab 11/30/14 2134 12/03/14 1416  WBC 6.1 5.8  NEUTROABS 4.8  --   HGB 9.3* 8.3*  HCT 28.5* 25.8*  MCV 84.5 84.8  PLT 272 257    Cardiac Enzymes:  Recent Labs Lab 12/03/14 1416  TROPONINI 0.11*    Lipid Panel:  Recent Labs Lab 12/03/14 2226  CHOL 127  TRIG 56  HDL 32*  CHOLHDL 4.0  VLDL 11  LDLCALC 84    CBG:  Recent Labs Lab 12/03/14 2329 12/04/14 0742 12/04/14 1120 12/04/14 1535 12/04/14 2138  GLUCAP 76 126* 140* 188* 157*    Microbiology: Results for orders placed or performed during the hospital encounter of 12/03/14  MRSA PCR Screening     Status: None   Collection Time: 12/03/14  7:46 PM  Result Value Ref Range Status   MRSA by PCR NEGATIVE NEGATIVE Final    Comment:        The GeneXpert MRSA Assay (FDA approved for NASAL specimens only), is one component of a comprehensive MRSA colonization surveillance program. It is not intended to diagnose MRSA infection nor to guide or monitor treatment for MRSA infections.     Coagulation Studies:  Recent Labs  12/03/14 2226  LABPROT 16.0*  INR 1.27    Imaging: Ct Head Wo Contrast  12/03/2014   CLINICAL DATA:  69 year old hypertensive diabetic female with increased confusion. Recent infection of right foot with fever. Initial encounter.  EXAM: CT HEAD WITHOUT CONTRAST  TECHNIQUE: Contiguous axial  images were obtained from the base of the skull through the vertex without intravenous contrast.  COMPARISON:  06/25/2014.  FINDINGS: Since the prior CT, interval development of large broad-based complex right-sided subdural collection with maximal thickness frontal region of 1.6 cm causing mass effect upon the right lateral ventricle which is displaced posteriorly and  towards the left with 1 cm of midline shift.  The right-sided complex subdural collection has an appearance most suggestive of acute on chronic subdural hematoma (although new from 06/25/2014).  Patient has a history fever. An empyema is a less likely consideration for the above described findings. If this were of high clinical concern MR would prove helpful for further delineation.  No CT evidence of large acute infarct.  Vascular calcifications  Mastoid air cells, middle ear cavities and visualized paranasal sinuses are clear.  No calvarial fracture noted.  IMPRESSION: Large broad-based right-sided subdural hematoma has developed since the prior CT. Maximal thickness 1.6 cm with midline shift to left by 1 cm.  These results were called by telephone at the time of interpretation on 12/03/2014 at 4:47 pm to Dr. Lisa Roca , who verbally acknowledged these results.   Electronically Signed   By: Genia Del M.D.   On: 12/03/2014 16:55   Mr Brain Wo Contrast  12/04/2014   CLINICAL DATA:  Three-week history of intermittent altered mental status, confusion and weakness. Followup subdural hematoma. History of diabetes, hypertension, neuropathy.  EXAM: MRI HEAD WITHOUT CONTRAST  TECHNIQUE: Multiplanar, multiecho pulse sequences of the brain and surrounding structures were obtained without intravenous contrast.  COMPARISON:  CT head December 03, 2014  FINDINGS: 18 mm RIGHT holo hemispheric T2 bright, intermediate to bright T1 subdural hematoma results in 7 mm RIGHT to LEFT midline shift, partially effaced RIGHT lateral ventricle without hydrocephalus or ventricular entrapment. A few scattered subcentimeter supratentorial white matter T2 hyperintensities compatible chronic small vessel ischemic disease, less than expected for age. No susceptibility artifact to suggest intraparenchymal blood products.  Normal major intracranial vascular flow voids noted at the skull base. Status post RIGHT ocular lens implant. Paranasal  sinuses and mastoid air cells are well aerated. No abnormal sellar expansion. No cerebellar tonsillar ectopia. No suspicious calvarial bone marrow signal.  IMPRESSION: 18 mm RIGHT holohemispheric early subacute subdural hematoma resulting in 7 mm RIGHT to LEFT midline shift, stable from yesterday's CT.  No acute ischemia.   Electronically Signed   By: Elon Alas M.D.   On: 12/04/2014 01:56    Medications:  Scheduled: . docusate sodium  100 mg Oral BID  . feeding supplement (GLUCERNA SHAKE)  237 mL Oral BID BM  . ferrous sulfate  325 mg Oral Daily  . furosemide  40 mg Oral BID  . glimepiride  1 mg Oral Q breakfast  . insulin aspart  0-15 Units Subcutaneous TID WC  . isosorbide dinitrate  30 mg Oral BID  . pantoprazole  40 mg Oral Daily  . potassium chloride SA  20 mEq Oral Daily  . propranolol  40 mg Oral TID  . ramipril  5 mg Oral Daily  . simvastatin  20 mg Oral QHS    Assessment/Plan:  69 y.o. female presenting with complaints of left sided weakness and confusion. Head CT performed and reviewed personally. Head CT shows a right SDH. There appears to be some chronic as well as acute features to the hematoma. Patient has no history of trauma and is on no anticoagulation.  -appreciate neurosurgery input, tentative plan for surgery on  Thursday -Rehab evaluation pending once surgery completed -hold all antiplatelet and anticoagulation -frequent neurochecks -will continue to monitor closely in the ICU   LOS: 2 days   Jim Like, DO [facility] [phone]  If 7pm- 7am, please page neurology on call as listed in AMION. 12/05/2014  7:52 AM

## 2014-12-05 NOTE — Progress Notes (Signed)
Notified MD Reynolds that pt's blood pressure is 173/60. Orders received to continue with PRN Labetalol if SBP greater than 180. Will continue to monitor.

## 2014-12-05 NOTE — Progress Notes (Signed)
PT Cancellation Note  Patient Details Name: [name] MRN: [id number] DOB: 20-Aug-1945   Cancelled Treatment:    Reason Eval/Treat Not Completed: Patient on active bedrest at this time.   Duncan Dull 12/05/2014, 8:03 AM Alben Deeds, PT DPT  [phone]

## 2014-12-05 NOTE — Progress Notes (Signed)
MD made aware of increased confusion. MD at bedside. No New orders given. Will continue to monitor closely.

## 2014-12-05 NOTE — Progress Notes (Addendum)
Notified by RN of concern of increased confusion. Upon arrival to bedside noted to be oriented x 3, following simple commands. Slightly more lethargic but overall stable neurological exam. Will hold on repeat head CT at this time. If any further change will order stat head CT.   BP continues to be elevated. Not responding to prn labetalol. Will try prn hydralazine.

## 2014-12-06 ENCOUNTER — Encounter (HOSPITAL_COMMUNITY): Payer: Self-pay | Admitting: *Deleted

## 2014-12-06 ENCOUNTER — Ambulatory Visit: Payer: Medicare HMO | Admitting: Surgery

## 2014-12-06 ENCOUNTER — Inpatient Hospital Stay (HOSPITAL_COMMUNITY): Payer: Medicare HMO | Admitting: Certified Registered Nurse Anesthetist

## 2014-12-06 ENCOUNTER — Encounter (HOSPITAL_COMMUNITY)
Admission: AD | Disposition: A | Discharge status: Skilled Nursing Facility | Payer: Self-pay | Source: Other Acute Inpatient Hospital | Attending: Neurosurgery

## 2014-12-06 DIAGNOSIS — S065X9A Traumatic subdural hemorrhage with loss of consciousness of unspecified duration, initial encounter: Secondary | ICD-10-CM | POA: Diagnosis present

## 2014-12-06 DIAGNOSIS — S065XAA Traumatic subdural hemorrhage with loss of consciousness status unknown, initial encounter: Secondary | ICD-10-CM | POA: Diagnosis present

## 2014-12-06 HISTORY — PX: BURR HOLE: SHX908

## 2014-12-06 LAB — GLUCOSE, CAPILLARY
GLUCOSE-CAPILLARY: 133 mg/dL — AB (ref 65–99)
GLUCOSE-CAPILLARY: 100 mg/dL — AB (ref 65–99)
GLUCOSE-CAPILLARY: 116 mg/dL — AB (ref 65–99)
Glucose-Capillary: 108 mg/dL — ABNORMAL HIGH (ref 65–99)

## 2014-12-06 LAB — CULTURE, BLOOD (ROUTINE X 2)
CULTURE: NO GROWTH
CULTURE: NO GROWTH

## 2014-12-06 SURGERY — CREATION, CRANIAL BURR HOLE
Anesthesia: General | Laterality: Right

## 2014-12-06 MED ORDER — BACITRACIN ZINC 500 UNIT/GM EX OINT
TOPICAL_OINTMENT | CUTANEOUS | Status: DC | PRN
  Administered 2014-12-06: 1 via TOPICAL

## 2014-12-06 MED ORDER — LIDOCAINE HCL (CARDIAC) 20 MG/ML IV SOLN
INTRAVENOUS | Status: DC | PRN
  Administered 2014-12-06: 40 mg via INTRAVENOUS

## 2014-12-06 MED ORDER — SODIUM CHLORIDE 0.9 % IV SOLN
INTRAVENOUS | Status: DC | PRN
  Administered 2014-12-06: 15:00:00 via INTRAVENOUS

## 2014-12-06 MED ORDER — ONDANSETRON HCL 4 MG/2ML IJ SOLN
INTRAMUSCULAR | Status: DC | PRN
  Administered 2014-12-06: 4 mg via INTRAVENOUS

## 2014-12-06 MED ORDER — BUPIVACAINE-EPINEPHRINE 0.5% -1:200000 IJ SOLN
INTRAMUSCULAR | Status: DC | PRN
Start: 2014-12-06 — End: 2014-12-06
  Administered 2014-12-06: 10 mL

## 2014-12-06 MED ORDER — ALBUMIN HUMAN 5 % IV SOLN
INTRAVENOUS | Status: DC | PRN
  Administered 2014-12-06: 16:00:00 via INTRAVENOUS

## 2014-12-06 MED ORDER — NEOSTIGMINE METHYLSULFATE 10 MG/10ML IV SOLN
INTRAVENOUS | Status: DC | PRN
  Administered 2014-12-06: 4 mg via INTRAVENOUS

## 2014-12-06 MED ORDER — ACETAMINOPHEN 650 MG RE SUPP: 650.0000 mg | RECTAL | Status: DC | PRN

## 2014-12-06 MED ORDER — PHENYLEPHRINE HCL 10 MG/ML IJ SOLN
10.0000 mg | INTRAVENOUS | Status: DC | PRN
  Administered 2014-12-06: 50 ug/min via INTRAVENOUS

## 2014-12-06 MED ORDER — THROMBIN 20000 UNITS EX SOLR
CUTANEOUS | Status: DC | PRN
  Administered 2014-12-06: 15:00:00 via TOPICAL

## 2014-12-06 MED ORDER — PROPOFOL 10 MG/ML IV BOLUS
INTRAVENOUS | Status: AC
  Filled 2014-12-06: qty 20

## 2014-12-06 MED ORDER — MIDAZOLAM HCL 2 MG/2ML IJ SOLN
INTRAMUSCULAR | Status: AC
  Filled 2014-12-06: qty 4

## 2014-12-06 MED ORDER — CALCIUM CHLORIDE 10 % IV SOLN: INTRAVENOUS | Status: DC | PRN

## 2014-12-06 MED ORDER — SODIUM CHLORIDE 0.9 % IV SOLN
INTRAVENOUS | Status: DC | PRN
  Administered 2014-12-06: 14:00:00 via INTRAVENOUS

## 2014-12-06 MED ORDER — SUGAMMADEX SODIUM 500 MG/5ML IV SOLN
INTRAVENOUS | Status: AC
  Filled 2014-12-06: qty 5

## 2014-12-06 MED ORDER — CALCIUM CHLORIDE 10 % IV SOLN
INTRAVENOUS | Status: DC | PRN
  Administered 2014-12-06: 300 mg via INTRAVENOUS
  Administered 2014-12-06: 500 mg via INTRAVENOUS
  Administered 2014-12-06: 200 mg via INTRAVENOUS

## 2014-12-06 MED ORDER — LIDOCAINE HCL (CARDIAC) 20 MG/ML IV SOLN
INTRAVENOUS | Status: AC
  Filled 2014-12-06: qty 5

## 2014-12-06 MED ORDER — FENTANYL CITRATE (PF) 100 MCG/2ML IJ SOLN
INTRAMUSCULAR | Status: DC | PRN
  Administered 2014-12-06: 25 ug via INTRAVENOUS

## 2014-12-06 MED ORDER — ONDANSETRON HCL 4 MG PO TABS
4.0000 mg | ORAL_TABLET | ORAL | Status: DC | PRN
Start: 2014-12-06 — End: 2014-12-13

## 2014-12-06 MED ORDER — HYDROMORPHONE HCL 2 MG PO TABS
2.0000 mg | ORAL_TABLET | ORAL | Status: DC | PRN
  Administered 2014-12-06 – 2014-12-10 (×6): 2 mg via ORAL
  Filled 2014-12-06 (×6): qty 1

## 2014-12-06 MED ORDER — POTASSIUM CHLORIDE 2 MEQ/ML IV SOLN
INTRAVENOUS | Status: DC
  Administered 2014-12-06: 18:00:00 via INTRAVENOUS
  Filled 2014-12-06 (×2): qty 1000

## 2014-12-06 MED ORDER — ROCURONIUM BROMIDE 50 MG/5ML IV SOLN
INTRAVENOUS | Status: AC
  Filled 2014-12-06: qty 1

## 2014-12-06 MED ORDER — ROCURONIUM BROMIDE 100 MG/10ML IV SOLN
INTRAVENOUS | Status: DC | PRN
  Administered 2014-12-06: 15 mg via INTRAVENOUS
  Administered 2014-12-06: 35 mg via INTRAVENOUS

## 2014-12-06 MED ORDER — DOCUSATE SODIUM 100 MG PO CAPS: 100.0000 mg | ORAL_CAPSULE | Freq: Two times a day (BID) | ORAL | Status: DC

## 2014-12-06 MED ORDER — ONDANSETRON HCL 4 MG/2ML IJ SOLN: 4.0000 mg | INTRAMUSCULAR | Status: DC | PRN

## 2014-12-06 MED ORDER — ONDANSETRON HCL 4 MG/2ML IJ SOLN
INTRAMUSCULAR | Status: AC
  Filled 2014-12-06: qty 2

## 2014-12-06 MED ORDER — SODIUM CHLORIDE 0.9 % IR SOLN
Status: DC | PRN
  Administered 2014-12-06: 15:00:00

## 2014-12-06 MED ORDER — PHENYLEPHRINE HCL 10 MG/ML IJ SOLN
INTRAMUSCULAR | Status: DC | PRN
  Administered 2014-12-06 (×3): 80 ug via INTRAVENOUS

## 2014-12-06 MED ORDER — PROPOFOL 10 MG/ML IV BOLUS
INTRAVENOUS | Status: DC | PRN
  Administered 2014-12-06: 120 mg via INTRAVENOUS

## 2014-12-06 MED ORDER — PROMETHAZINE HCL 25 MG PO TABS: 12.5000 mg | ORAL_TABLET | ORAL | Status: DC | PRN

## 2014-12-06 MED ORDER — HYDROMORPHONE HCL 1 MG/ML IJ SOLN
0.5000 mg | INTRAMUSCULAR | Status: DC | PRN
  Administered 2014-12-06: 0.5 mg via INTRAVENOUS
  Administered 2014-12-07: 1 mg via INTRAVENOUS
  Administered 2014-12-07: 0.5 mg via INTRAVENOUS
  Filled 2014-12-06 (×3): qty 1

## 2014-12-06 MED ORDER — GLYCOPYRROLATE 0.2 MG/ML IJ SOLN
INTRAMUSCULAR | Status: DC | PRN
  Administered 2014-12-06: .6 mg via INTRAVENOUS

## 2014-12-06 MED ORDER — FENTANYL CITRATE (PF) 250 MCG/5ML IJ SOLN
INTRAMUSCULAR | Status: AC
  Filled 2014-12-06: qty 5

## 2014-12-06 MED ORDER — PANTOPRAZOLE SODIUM 40 MG IV SOLR
40.0000 mg | Freq: Every day | INTRAVENOUS | Status: DC
  Administered 2014-12-06: 40 mg via INTRAVENOUS
  Filled 2014-12-06: qty 40

## 2014-12-06 MED ORDER — LABETALOL HCL 5 MG/ML IV SOLN
10.0000 mg | INTRAVENOUS | Status: DC | PRN
  Administered 2014-12-09 – 2014-12-11 (×3): 10 mg via INTRAVENOUS

## 2014-12-06 MED ORDER — 0.9 % SODIUM CHLORIDE (POUR BTL) OPTIME
TOPICAL | Status: DC | PRN
  Administered 2014-12-06 (×2): 1000 mL

## 2014-12-06 MED ORDER — ACETAMINOPHEN 325 MG PO TABS: 650.0000 mg | ORAL_TABLET | ORAL | Status: DC | PRN

## 2014-12-06 SURGICAL SUPPLY — 65 items
BAG DECANTER FOR FLEXI CONT (MISCELLANEOUS) IMPLANT
BIT DRILL WIRE PASS 1.3MM (BIT) IMPLANT
BLADE CLIPPER SURG NEURO (BLADE) ×3 IMPLANT
BRUSH SCRUB EZ 1% IODOPHOR (MISCELLANEOUS) ×1 IMPLANT
BRUSH SCRUB EZ PLAIN DRY (MISCELLANEOUS) ×3 IMPLANT
BUR ACORN 6.0 PRECISION (BURR) ×2 IMPLANT
BUR ACORN 6.0MM PRECISION (BURR) ×1
BUR SPIRAL ROUTER 2.3 (BUR) ×1 IMPLANT
BUR SPIRAL ROUTER 2.3MM (BUR) ×1
CANISTER SUCT 3000ML PPV (MISCELLANEOUS) ×3 IMPLANT
CLIP TI MEDIUM 6 (CLIP) IMPLANT
COVER BACK TABLE 60X90IN (DRAPES) IMPLANT
DRAIN SNY WOU 7FLT (WOUND CARE) IMPLANT
DRAPE NEUROLOGICAL W/INCISE (DRAPES) ×3 IMPLANT
DRAPE SURG 17X23 STRL (DRAPES) IMPLANT
DRAPE WARM FLUID 44X44 (DRAPE) ×3 IMPLANT
DRILL WIRE PASS 1.3MM (BIT)
ELECT CAUTERY BLADE 6.4 (BLADE) ×3 IMPLANT
ELECT REM PT RETURN 9FT ADLT (ELECTROSURGICAL) ×3
ELECTRODE REM PT RTRN 9FT ADLT (ELECTROSURGICAL) ×1 IMPLANT
EVACUATOR 1/8 PVC DRAIN (DRAIN) IMPLANT
EVACUATOR SILICONE 100CC (DRAIN) IMPLANT
GAUZE SPONGE 4X4 12PLY STRL (GAUZE/BANDAGES/DRESSINGS) ×2 IMPLANT
GAUZE SPONGE 4X4 16PLY XRAY LF (GAUZE/BANDAGES/DRESSINGS) IMPLANT
GLOVE BIO SURGEON STRL SZ7 (GLOVE) ×4 IMPLANT
GLOVE BIO SURGEON STRL SZ8 (GLOVE) ×5 IMPLANT
GLOVE BIO SURGEON STRL SZ8.5 (GLOVE) ×3 IMPLANT
GLOVE EXAM NITRILE LRG STRL (GLOVE) IMPLANT
GLOVE EXAM NITRILE MD LF STRL (GLOVE) IMPLANT
GLOVE EXAM NITRILE XL STR (GLOVE) IMPLANT
GLOVE EXAM NITRILE XS STR PU (GLOVE) IMPLANT
GLOVE INDICATOR 7.5 STRL GRN (GLOVE) ×6 IMPLANT
GLOVE INDICATOR 8.5 STRL (GLOVE) ×2 IMPLANT
GOWN STRL REUS W/ TWL LRG LVL3 (GOWN DISPOSABLE) IMPLANT
GOWN STRL REUS W/ TWL XL LVL3 (GOWN DISPOSABLE) IMPLANT
GOWN STRL REUS W/TWL LRG LVL3 (GOWN DISPOSABLE) ×3
GOWN STRL REUS W/TWL XL LVL3 (GOWN DISPOSABLE) ×6
KIT BASIN OR (CUSTOM PROCEDURE TRAY) ×3 IMPLANT
KIT ROOM TURNOVER OR (KITS) ×3 IMPLANT
MARKER SKIN DUAL TIP RULER LAB (MISCELLANEOUS) IMPLANT
NEEDLE HYPO 22GX1.5 SAFETY (NEEDLE) ×3 IMPLANT
NS IRRIG 1000ML POUR BTL (IV SOLUTION) ×5 IMPLANT
PACK CRANIOTOMY (CUSTOM PROCEDURE TRAY) ×3 IMPLANT
PAD ARMBOARD 7.5X6 YLW CONV (MISCELLANEOUS) ×3 IMPLANT
PATTIES SURGICAL .25X.25 (GAUZE/BANDAGES/DRESSINGS) IMPLANT
PATTIES SURGICAL .5 X.5 (GAUZE/BANDAGES/DRESSINGS) IMPLANT
PATTIES SURGICAL .5 X3 (DISPOSABLE) IMPLANT
PATTIES SURGICAL 1X1 (DISPOSABLE) IMPLANT
PIN MAYFIELD SKULL DISP (PIN) IMPLANT
RUBBERBAND STERILE (MISCELLANEOUS) ×6 IMPLANT
SPONGE NEURO XRAY DETECT 1X3 (DISPOSABLE) IMPLANT
STAPLER SKIN PROX WIDE 3.9 (STAPLE) ×3 IMPLANT
SUT ETHILON 3 0 FSL (SUTURE) IMPLANT
SUT NURALON 4 0 TR CR/8 (SUTURE) ×4 IMPLANT
SUT PROLENE 6 0 BV (SUTURE) IMPLANT
SUT VIC AB 2-0 CP2 18 (SUTURE) ×3 IMPLANT
SUT VIC AB 3-0 FS2 27 (SUTURE) ×3 IMPLANT
SUT VICRYL 4-0 PS2 18IN ABS (SUTURE) IMPLANT
SYR CONTROL 10ML LL (SYRINGE) ×3 IMPLANT
TAPE CLOTH SURG 4X10 WHT LF (GAUZE/BANDAGES/DRESSINGS) ×2 IMPLANT
TOWEL OR 17X24 6PK STRL BLUE (TOWEL DISPOSABLE) ×3 IMPLANT
TOWEL OR 17X26 10 PK STRL BLUE (TOWEL DISPOSABLE) ×3 IMPLANT
TRAY FOLEY W/METER SILVER 14FR (SET/KITS/TRAYS/PACK) ×2 IMPLANT
UNDERPAD 30X30 INCONTINENT (UNDERPADS AND DIAPERS) IMPLANT
WATER STERILE IRR 1000ML POUR (IV SOLUTION) ×3 IMPLANT

## 2014-12-06 NOTE — Transfer of Care (Signed)
Immediate Anesthesia Transfer of Care Note  Patient: [name]  Procedure(s) Performed: Procedure(s): Right burr hole  for subdural hematoma (Right)  Patient Location: PACU  Anesthesia Type:General  Level of Consciousness: awake, alert  and oriented  Airway & Oxygen Therapy: Patient Spontanous Breathing and Patient connected to face mask oxygen  Post-op Assessment: Report given to RN and Post -op Vital signs reviewed and stable  Post vital signs: Reviewed and stable  Last Vitals:  Filed Vitals:   12/06/14 1620  BP:   Pulse:   Temp: 36.7 C  Resp:     Complications: No apparent anesthesia complications

## 2014-12-06 NOTE — Anesthesia Procedure Notes (Signed)
Procedure Name: Intubation Date/Time: 12/06/2014 3:01 PM Performed by: Clearnce Sorrel Pre-anesthesia Checklist: Patient identified, Timeout performed, Emergency Drugs available, Suction available and Patient being monitored Patient Re-evaluated:Patient Re-evaluated prior to inductionOxygen Delivery Method: Circle system utilized Preoxygenation: Pre-oxygenation with 100% oxygen Intubation Type: IV induction Ventilation: Mask ventilation without difficulty Laryngoscope Size: Mac and 3 Grade View: Grade I Tube type: Oral Tube size: 7.0 mm Number of attempts: 1 Airway Equipment and Method: Stylet Placement Confirmation: ETT inserted through vocal cords under direct vision,  positive ETCO2 and breath sounds checked- equal and bilateral Secured at: 22 cm Tube secured with: Tape Dental Injury: Teeth and Oropharynx as per pre-operative assessment

## 2014-12-06 NOTE — Progress Notes (Signed)
Patient ID: [name], female   DOB: 16-Mar-1946, 69 y.o.   MRN: [id number] Subjective:  The patient is somnolent but easily arousable. She is in no apparent distress. At times she is mildly confused.  Objective: Vital signs in last 24 hours: Temp:  [98.6 F (37 C)-102.6 F (39.2 C)] 98.6 F (37 C) (08/04 1201) Pulse Rate:  [75-95] 88 (08/04 1200) Resp:  [17-33] 23 (08/04 1200) BP: (129-193)/(46-85) 136/48 mmHg (08/04 1200) SpO2:  [93 %-100 %] 97 % (08/04 1200)  Intake/Output from previous day: 08/03 0701 - 08/04 0700 In: 350 [IV Piggyback:350] Out: -  Intake/Output this shift: Total I/O In: 140 [Other:40; IV Piggyback:100] Out: -   Physical exam the patient is alert and oriented 2, person, [name], Glasgow Coma Scale 14. Her speech is normal. She is mildly left hemiparetic.  Lab Results:  Recent Labs  12/03/14 1416 12/05/14 1835  WBC 5.8 6.2  HGB 8.3* 8.3*  HCT 25.8* 24.9*  PLT 257 224   BMET  Recent Labs  12/03/14 1416 12/05/14 1120  NA 133* 135  K 4.0 4.8  CL 106 106  CO2 21* 20*  GLUCOSE 152* 174*  BUN 30* 23*  CREATININE 1.38* 1.16*  CALCIUM 7.7* 7.8*    Studies/Results: Dg Chest Port 1 View  12/05/2014   CLINICAL DATA:  Fever. Hypertension and diabetes. Congestive heart failure. Chronic kidney disease.  EXAM: PORTABLE CHEST - 1 VIEW  COMPARISON:  10/08/2014  FINDINGS: Increased size of small bilateral pleural effusions and bibasilar infiltrates or atelectasis seen since prior study. Cardiomegaly is stable. Multiple old right rib fracture deformities again noted.  IMPRESSION: Increased small bilateral pleural effusions, and bibasilar atelectasis versus infiltrates.  Stable cardiomegaly.   Electronically Signed   By: [name] M.D.   On: 12/05/2014 20:03    Assessment/Plan: Right subdural hematoma: I discussed the situation with the patient, her sister, sister-in-law, husband and son at the bedside. We have discussed the various  treatment options including surgery. I have answered all the questions regarding surgery. She wants to proceed with a right burr hole versus craniotomy for evacuation of the subdural hematoma.  LOS: 3 days     [name] D 12/06/2014, 1:54 PM

## 2014-12-06 NOTE — Progress Notes (Signed)
Subjective: Resting comfortably, no acute concerns. Febrile overnight, started on vancomycin and zosyn for possible HCAP. No leukocytosis.   Tentatively scheduled for surgery today.   Objective: Current vital signs: BP 139/51 mmHg  Pulse 88  Temp(Src) 101.2 F (38.4 C) (Oral)  Resp 26  Ht 5\' 7"  (1.702 m)  Wt 66 kg (145 lb 8.1 oz)  BMI 22.78 kg/m2  SpO2 96% Vital signs in last 24 hours: Temp:  [99.2 F (37.3 C)-102.6 F (39.2 C)] 101.2 F (38.4 C) (08/04 0630) Pulse Rate:  [53-95] 88 (08/04 0800) Resp:  [17-33] 26 (08/04 0800) BP: (129-193)/(46-95) 139/51 mmHg (08/04 0800) SpO2:  [92 %-100 %] 96 % (08/04 0800)  Intake/Output from previous day: 08/03 0701 - 08/04 0700 In: 350 [IV Piggyback:350] Out: -  Intake/Output this shift:   Nutritional status: Diet NPO time specified Except for: Sips with Meds  Cardiovascular - S1, S2 normal Lungs - chest clear, no wheezing, rales, normal symmetric air entry Abdomen - soft, non-tender; bowel sounds normal; no masses, no organomegaly Neurologic Exam: Mental Status: Alert. Not oriented to time. Knows she is in the hospital. Speech fluent without evidence of aphasia. Mild slurring of speech. Able to follow 3 step commands without some reinforcement. Cranial Nerves: II:  pupils equal, round, reactive to light  III,IV, VI: ptosis not present, extra-ocular motions intact bilaterally V,VII: smile symmetric, facial light touch sensation normal bilaterally  Motor: Right :Upper extremity 5-/5Left: Upper extremity 3/5 Lower extremity 3/5Lower extremity 1-2/5 Tone and bulk:normal tone throughout; no atrophy noted Sensory: light touch intact throughout, bilaterally  Lab Results: Basic Metabolic Panel:  Recent Labs Lab 11/30/14 2134 12/03/14 1416 12/05/14 1120  NA 135 133* 135  K 4.1 4.0 4.8  CL 108 106 106  CO2 21*  21* 20*  GLUCOSE 136* 152* 174*  BUN 26* 30* 23*  CREATININE 1.28* 1.38* 1.16*  CALCIUM 8.0* 7.7* 7.8*    Liver Function Tests:  Recent Labs Lab 12/03/14 1416  AST 26  ALT 13*  ALKPHOS 50  BILITOT 0.4  PROT 6.1*  ALBUMIN 2.4*   No results for input(s): LIPASE, AMYLASE in the last 168 hours. No results for input(s): AMMONIA in the last 168 hours.  CBC:  Recent Labs Lab 11/30/14 2134 12/03/14 1416 12/05/14 1835  WBC 6.1 5.8 6.2  NEUTROABS 4.8  --   --   HGB 9.3* 8.3* 8.3*  HCT 28.5* 25.8* 24.9*  MCV 84.5 84.8 80.1  PLT 272 257 224    Cardiac Enzymes:  Recent Labs Lab 12/03/14 1416  TROPONINI 0.11*    Lipid Panel:  Recent Labs Lab 12/03/14 2226  CHOL 127  TRIG 56  HDL 32*  CHOLHDL 4.0  VLDL 11  LDLCALC 84    CBG:  Recent Labs Lab 12/05/14 1146 12/05/14 1632 12/05/14 1944 12/05/14 2201 12/06/14 0816  GLUCAP 169* 94 126* 111* 133*    Microbiology: Results for orders placed or performed during the hospital encounter of 12/03/14  MRSA PCR Screening     Status: None   Collection Time: 12/03/14  7:46 PM  Result Value Ref Range Status   MRSA by PCR NEGATIVE NEGATIVE Final    Comment:        The GeneXpert MRSA Assay (FDA approved for NASAL specimens only), is one component of a comprehensive MRSA colonization surveillance program. It is not intended to diagnose MRSA infection nor to guide or monitor treatment for MRSA infections.     Coagulation Studies:  Recent Labs  12/03/14  2226  LABPROT 16.0*  INR 1.27    Imaging: Dg Chest Port 1 View  12/05/2014   CLINICAL DATA:  Fever. Hypertension and diabetes. Congestive heart failure. Chronic kidney disease.  EXAM: PORTABLE CHEST - 1 VIEW  COMPARISON:  10/08/2014  FINDINGS: Increased size of small bilateral pleural effusions and bibasilar infiltrates or atelectasis seen since prior study. Cardiomegaly is stable. Multiple old right rib fracture deformities again noted.  IMPRESSION:  Increased small bilateral pleural effusions, and bibasilar atelectasis versus infiltrates.  Stable cardiomegaly.   Electronically Signed   By: Earle Gell M.D.   On: 12/05/2014 20:03    Medications:  Scheduled: . docusate sodium  100 mg Oral BID  . feeding supplement (GLUCERNA SHAKE)  237 mL Oral BID BM  . ferrous sulfate  325 mg Oral Daily  . furosemide  40 mg Oral BID  . glimepiride  1 mg Oral Q breakfast  . insulin aspart  0-15 Units Subcutaneous TID WC  . isosorbide dinitrate  30 mg Oral BID  . pantoprazole  40 mg Oral Daily  . piperacillin-tazobactam (ZOSYN)  IV  3.375 g Intravenous 3 times per day  . potassium chloride SA  20 mEq Oral Daily  . propranolol  40 mg Oral TID  . ramipril  5 mg Oral Daily  . simvastatin  20 mg Oral QHS  . vancomycin  500 mg Intravenous Q12H    Assessment/Plan:  69 y.o. female presenting with complaints of left sided weakness and confusion. Head CT performed and reviewed personally. Head CT shows a right SDH. There appears to be some chronic as well as acute features to the hematoma. Patient has no history of trauma and is on no anticoagulation.  Spiked fever overnight, WBC count is normal. Started on vancomycin and zosyn for possible HCAP noted on CXR.   -appreciate neurosurgery input, tentative plan for surgery on Thursday -Rehab evaluation pending once surgery completed -hold all antiplatelet and anticoagulation -frequent neurochecks -will continue to monitor closely in the ICU -continue vancomycin and zosyn, appreciate pharmacy input   LOS: 3 days   Jim Like, DO [facility] [phone]  If 7pm- 7am, please page neurology on call as listed in AMION. 12/06/2014  9:11 AM

## 2014-12-06 NOTE — Progress Notes (Signed)
Subjective:  The patient is alert and in no apparent distress.  Objective: Vital signs in last 24 hours: Temp:  [98.6 F (37 C)-102.6 F (39.2 C)] 98.6 F (37 C) (08/04 1201) Pulse Rate:  [75-95] 90 (08/04 1400) Resp:  [17-33] 27 (08/04 1400) BP: (129-171)/(46-85) 138/68 mmHg (08/04 1400) SpO2:  [93 %-100 %] 100 % (08/04 1400)  Intake/Output from previous day: 08/03 0701 - 08/04 0700 In: 350 [IV Piggyback:350] Out: -  Intake/Output this shift: Total I/O In: 140 [Other:40; IV Piggyback:100] Out: -   Physical exam the patient is alert. She answers simple questions. She is moving all 4 extremities.  Lab Results:  Recent Labs  12/05/14 1835  WBC 6.2  HGB 8.3*  HCT 24.9*  PLT 224   BMET  Recent Labs  12/05/14 1120  NA 135  K 4.8  CL 106  CO2 20*  GLUCOSE 174*  BUN 23*  CREATININE 1.16*  CALCIUM 7.8*    Studies/Results: Dg Chest Port 1 View  12/05/2014   CLINICAL DATA:  Fever. Hypertension and diabetes. Congestive heart failure. Chronic kidney disease.  EXAM: PORTABLE CHEST - 1 VIEW  COMPARISON:  10/08/2014  FINDINGS: Increased size of small bilateral pleural effusions and bibasilar infiltrates or atelectasis seen since prior study. Cardiomegaly is stable. Multiple old right rib fracture deformities again noted.  IMPRESSION: Increased small bilateral pleural effusions, and bibasilar atelectasis versus infiltrates.  Stable cardiomegaly.   Electronically Signed   By: Earle Gell M.D.   On: 12/05/2014 20:03    Assessment/Plan: The patient is doing well.  LOS: 3 days     Midge Momon D 12/06/2014, 4:14 PM

## 2014-12-06 NOTE — Op Note (Signed)
Brief history: The patient is a 69 year old black female who was admitted with confusion and left-sided weakness. She was found to have a right subdural hematoma. I discussed the situation with the patient and her family and recommended surgery. The patient has weighed the risks, benefits, and alternative surgery and decided proceed with a right bur holes for evacuation of the subdural hematoma.  Preop diagnosis: Right subdural hematoma  Postoperative diagnosis: The same  Procedure: Right bur holes for evacuation of subdural hematoma  Surgeon: Dr. Earle Gell  Assistant: Dr. Erline Levine  Anesthesia: Gen. endotracheal  Estimated blood loss: Minimal  Specimens: None  Drains: None  Complications: None  Description of procedure 01 the patient was brought to the operating room by the anesthesia team. General endotracheal anesthesia was induced. The patient remained in the supine position. I applied the [place] 3 point head rest of the patient's calvarium. A roll was placed on her right shoulder. The patient's head was turned to the left exposing her right scalp. Her right scalp was then shaved with clippers and prepared with Betadine scrub and Betadine solution. Sterile drapes were applied. I then injected the area to be incised with Marcaine with epinephrine solution. I used a scalpel to make a right frontal and right parietal incision. I exposed the underlying calvarium with electrocautery. We used the cerebellar retractors for exposure. I used a high-speed drill to create a right frontal and right parietal burr hole. I coagulated the underlying dura. We encountered several subdural membranes. We coagulated these. This released subdural hematoma fluid under pressure. I then irrigated out the subdural space with saline solution until the irrigant came back crystal clear. I did not see any active bleeding points. I then laid some Gelfoam over the bur holes. We removed the retractors. We  reapproximated the patient's galea with interrupted 20 vertical suture. We reapproximated the skin with stainless steel staples. The wounds were then coated with bacitracin ointment. A sterile dressing was applied. The drapes were removed. The [place] 3 point headrest was then removed. By report all sponge, instrument, and needle counts were correct at the end of this case.

## 2014-12-06 NOTE — Anesthesia Preprocedure Evaluation (Addendum)
Anesthesia Evaluation  Patient identified by MRN, date of birth, ID band Patient awake    Reviewed: Allergy & Precautions, NPO status , Patient's Chart, lab work & pertinent test results  Airway Mallampati: II       Dental  (+) Partial Upper, Partial Lower   Pulmonary former smoker,  breath sounds clear to auscultation        Cardiovascular hypertension, + CAD and +CHF Rhythm:Regular Rate:Normal  Recent Cath and PCI Stable Diastolic Heart Failure   Neuro/Psych    GI/Hepatic   Endo/Other  diabetes  Renal/GU CRFRenal disease     Musculoskeletal   Abdominal (+)  Abdomen: soft.    Peds  Hematology  (+) anemia ,   Anesthesia Other Findings   Reproductive/Obstetrics                          Lab Results  Component Value Date   WBC 6.2 12/05/2014   HGB 8.3* 12/05/2014   HCT 24.9* 12/05/2014   MCV 80.1 12/05/2014   PLT 224 12/05/2014   Lab Results  Component Value Date   CREATININE 1.16* 12/05/2014   BUN 23* 12/05/2014   NA 135 12/05/2014   K 4.8 12/05/2014   CL 106 12/05/2014   CO2 20* 12/05/2014   Lab Results  Component Value Date   INR 1.27 12/03/2014   EKG: normal sinus rhythm, occasional atrial ectopic beats.   Anesthesia Physical Anesthesia Plan  ASA: III  Anesthesia Plan: General   Post-op Pain Management:    Induction: Intravenous  Airway Management Planned: Oral ETT  Additional Equipment: Arterial line  Intra-op Plan:   Post-operative Plan:   Informed Consent: I have reviewed the patients History and Physical, chart, labs and discussed the procedure including the risks, benefits and alternatives for the proposed anesthesia with the patient or authorized representative who has indicated his/her understanding and acceptance.   Dental advisory given  Plan Discussed with:   Anesthesia Plan Comments:       Anesthesia Quick Evaluation

## 2014-12-06 NOTE — Progress Notes (Signed)
OT Cancellation Note  Patient Details Name: BAILY HOVANEC MRN: [id number] DOB: Mar 28, 1946   Cancelled Treatment:    Reason Eval/Treat Not Completed: Medical issues which prohibited therapy  - Pt for possible surgery today and remains on bedrest.  Will check back tomorrow.   Darlina Rumpf [place], OTR/L [phone]  12/06/2014, 9:28 AM

## 2014-12-07 ENCOUNTER — Inpatient Hospital Stay (HOSPITAL_COMMUNITY): Payer: Medicare HMO

## 2014-12-07 ENCOUNTER — Encounter (HOSPITAL_COMMUNITY): Payer: Self-pay | Admitting: Neurosurgery

## 2014-12-07 LAB — GLUCOSE, CAPILLARY
GLUCOSE-CAPILLARY: 74 mg/dL (ref 65–99)
Glucose-Capillary: 144 mg/dL — ABNORMAL HIGH (ref 65–99)
Glucose-Capillary: 17 mg/dL — CL (ref 65–99)
Glucose-Capillary: 196 mg/dL — ABNORMAL HIGH (ref 65–99)
Glucose-Capillary: 115 mg/dL — ABNORMAL HIGH (ref 65–99)

## 2014-12-07 LAB — PREPARE RBC (CROSSMATCH)

## 2014-12-07 LAB — CBC
HCT: 21.3 % — ABNORMAL LOW (ref 36.0–46.0)
HEMOGLOBIN: 6.9 g/dL — AB (ref 12.0–15.0)
MCH: 26.8 pg (ref 26.0–34.0)
MCHC: 32.4 g/dL (ref 30.0–36.0)
MCV: 82.9 fL (ref 78.0–100.0)
PLATELETS: 295 10*3/uL (ref 150–400)
RBC: 2.57 MIL/uL — AB (ref 3.87–5.11)
RDW: 18.8 % — ABNORMAL HIGH (ref 11.5–15.5)
WBC: 6.6 10*3/uL (ref 4.0–10.5)

## 2014-12-07 LAB — ABO/RH: ABO/RH(D): O POS

## 2014-12-07 MED ORDER — SODIUM CHLORIDE 0.9 % IV SOLN: Freq: Once | INTRAVENOUS | Status: DC

## 2014-12-07 MED ORDER — SODIUM CHLORIDE 0.9 % IV SOLN
Freq: Once | INTRAVENOUS | Status: AC
  Administered 2014-12-07: 10 mL/h via INTRAVENOUS

## 2014-12-07 MED ORDER — DEXTROSE 50 % IV SOLN
INTRAVENOUS | Status: AC
Start: 2014-12-07 — End: 2014-12-07
  Administered 2014-12-07: 50 mL
  Filled 2014-12-07: qty 50

## 2014-12-07 MED ORDER — PANTOPRAZOLE SODIUM 40 MG PO TBEC
40.0000 mg | DELAYED_RELEASE_TABLET | Freq: Every day | ORAL | Status: DC
  Administered 2014-12-07 – 2014-12-12 (×6): 40 mg via ORAL
  Filled 2014-12-07 (×7): qty 1

## 2014-12-07 NOTE — Anesthesia Postprocedure Evaluation (Signed)
  Anesthesia Post-op Note  Patient: [name]  Procedure(s) Performed: Procedure(s): Right burr hole  for subdural hematoma (Right)  Patient Location: ICU  Anesthesia Type:General  Level of Consciousness: awake and alert   Airway and Oxygen Therapy: Patient Spontanous Breathing  Post-op Pain: minimal  Post-op Assessment: Post-op Vital signs reviewed LLE Motor Response: Purposeful movement   RLE Motor Response: Purposeful movement        Post-op Vital Signs: Reviewed and stable  Last Vitals:  Filed Vitals:   12/07/14 1551  BP:   Pulse:   Temp: 37 C  Resp:     Complications: No apparent anesthesia complications

## 2014-12-07 NOTE — Progress Notes (Signed)
Notified Neuro Sx Md about the pts wound on RLE. [place] nurse consulted and stated a ortho consult should be obtained. Neuro Sx Md notified of need for consult. Stated would have one ordered. Will continue to monitor and support the pt according to [facility] nurse recommendations.

## 2014-12-07 NOTE — Consult Note (Addendum)
WOC wound consult note Reason for Consult: Consult requested for sacrum and BLE. 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  2.3X1X.2cm, 100% red and moist, no odor, mod amt yellow drainage, scar tissue from previous surgery surrounding site. Left plantar foor with dry callous; .2X.2cm, no open wound or drainage, no topical treatment needed. Right foot with full thickness laceration; 2.5X1X.4cm, 10% loose slough, 90% red, bone palpable, skin flapping away from great toe.  C shaped wound below this with skin approximated; mod amt thick tan drainage, some odor.  Pressure Ulcer POA: Yes Dressing procedure/placement/frequency: Recommend consult to ortho service for further plan of care to foot wound R/T exposed bone and pus.  Aquacel to provide antimicrobial benefits and absorb drainage to sacrum wound.  Sport low airloss bed to reduce pressure. Please re-consult if further assistance is needed.  Thank-you,  Julien Girt MSN, [place], [place], [place], [place]

## 2014-12-07 NOTE — Progress Notes (Signed)
PT Cancellation Note  Patient Details Name: [name] MRN: [id number] DOB: Aug 23, 1945   Cancelled Treatment:    Reason Eval/Treat Not Completed: Patient not medically ready, active bedrest orders, will see once activity orders updated.   Duncan Dull 12/07/2014, 4:26 PM Alben Deeds, [place] DPT  [phone]

## 2014-12-07 NOTE — Evaluation (Signed)
Speech Language Pathology Evaluation Patient Details Name: [name] MRN: [id number] DOB: June 27, 1945 Today's Date: 12/07/2014 Time: 6440-3474 SLP Time Calculation (min) (ACUTE ONLY): 24 min  Problem List:  Patient Active Problem List   Diagnosis Date Noted  . Subdural hematoma 12/06/2014  . Pressure ulcer 12/05/2014  . SDH (subdural hematoma) 12/03/2014  . Chronic diastolic heart failure 25/95/6387  . Melena 10/02/2014  . DM (diabetes mellitus) 02/04/2010  . ANEMIA, CHRONIC 02/04/2010  . Essential hypertension 02/04/2010  . Coronary atherosclerosis 02/04/2010   Past Medical History:  Past Medical History  Diagnosis Date  . Hypertension   . Diabetes mellitus without complication   . Anemia   . Coronary artery disease   . CHF (congestive heart failure)   . Glaucoma   . Hyperlipidemia   . Chronic kidney disease   . Neuropathy   . Chronic anemia    Past Surgical History:  Past Surgical History  Procedure Laterality Date  . Cardiac catheterization    . Esophagogastroduodenoscopy (egd) with propofol N/A 09/24/2014    Elliott-gastritis & normal Billroth II changes   . Colonoscopy with propofol N/A 09/24/2014    Elliott-Incomplete secondary to prep  . Coronary angioplasty  2016    stent placed  . Bilroth ii procedure    . Colonoscopy with propofol N/A 11/12/2014    Procedure: COLONOSCOPY WITH PROPOFOL;  Surgeon: Manya Silvas, MD;  Location: [facility] ENDOSCOPY;  Service: Endoscopy;  Laterality: N/A;  Trudee Kuster hole Right 12/06/2014    Procedure: Right burr hole  for subdural hematoma;  Surgeon: Newman Pies, MD;  Location: [facility] NEURO ORS;  Service: Neurosurgery;  Laterality: Right;   HPI:  69 year old female was admitted with confusion and left-sided weakness. She was found to have a right subdural hematoma; underwent right bur hole evacuation 8/4.     Assessment / Plan / Recommendation Clinical Impression  Pt presents with deficits in cognition marked by disorientation to  elements of time, difficulty shifting attention between tasks and establishing new task set, mild deficits in short-term recall.  Speech clear, insight impaired.  Baseline cognitive function is unclear.  Recommend SLP f/u in acute care; f/u needs TBD.     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)   Cognition  Overall Cognitive Status: No family/caregiver present to determine baseline cognitive functioning Arousal/Alertness: Awake/alert Orientation Level: Oriented to place;Oriented to person;Oriented to situation;Disoriented to time Attention: Selective Sustained Attention: Impaired Selective Attention: Impaired Selective Attention Impairment: Verbal basic Memory: Impaired Memory Impairment: Retrieval deficit Awareness: Impaired Awareness Impairment: Intellectual impairment Problem Solving: Impaired Executive Function: Writer: Impaired Organizing Impairment: Verbal basic Safety/Judgment: Impaired    Comprehension  Auditory Comprehension Overall Auditory Comprehension: Appears within functional limits for tasks assessed Visual Recognition/Discrimination Discrimination: Within Function Limits Reading Comprehension Reading Status: Impaired Word level: Impaired    Expression Expression Primary Mode of Expression: Verbal Verbal Expression Overall Verbal Expression: Appears within functional limits for tasks assessed   Oral / Motor Oral Motor/Sensory Function Overall Oral Motor/Sensory Function: Appears within functional limits for tasks assessed Motor  Speech Overall Motor Speech:  Appears within functional limits for tasks assessed      Krystal Kil L. Tivis Ringer, [place] CCC/SLP Pager [phone]  [name] 12/07/2014, 10:36 AM

## 2014-12-07 NOTE — Progress Notes (Signed)
Patient arrived from [place] with family. Safety precautions and orders reviewed with patient/family. Pt denied pain. No other distress noted. Will continue to monitor.  [name]

## 2014-12-07 NOTE — Consult Note (Signed)
Reason for Consult: Right Great Toe Wound Referring Physician: Dr. Arnoldo Morale, Neurosurgery  [name] is an 69 y.o. female.  HPI: Patient is a 68 year old female who was taken to [facility] ED on 11/30/2014 (Friday) for right leg/foot swelling by her husband who first noticed blood on her sock earlier that day.  As per the ED report, the patient had been treating her foot with Epson salt soaks.  It was dressed with Xeroform dressing and wrapped in Kerlex.  She was referred to the [facility].  She was placed on clindamycin and cipro.   She unfortunately developed increased confusion requiring reevaluation in the ED and found to have elevated fever some weakness on the left side.  She was worked up and found to have a subdural hematoma.  She was subsequently transferred to [facility] for further treatment.  Seen by Neurosurgery and underwent Right-sided bur hole procedure for decompression evacuation of subdural hematoma. During her recovery, the ICU RN removed the right toe dressing to inspect the foot to find the ulcer.  Wound Care Consult called.  Reviewed their note.  It recommended Ortho Services be consulted for treatment of the foot wound.    Past Medical History  Diagnosis Date  . Hypertension   . Diabetes mellitus without complication   . Anemia   . Coronary artery disease   . CHF (congestive heart failure)   . Glaucoma   . Hyperlipidemia   . Chronic kidney disease   . Neuropathy   . Chronic anemia     Past Surgical History  Procedure Laterality Date  . Cardiac catheterization    . Esophagogastroduodenoscopy (egd) with propofol N/A 09/24/2014    Elliott-gastritis & normal Billroth II changes   . Colonoscopy with propofol N/A 09/24/2014    Elliott-Incomplete secondary to prep  . Coronary angioplasty  2016    stent placed  . Bilroth ii procedure    . Colonoscopy with propofol N/A 11/12/2014    Procedure: COLONOSCOPY WITH PROPOFOL;  Surgeon: Manya Silvas, MD;   Location: [facility] ENDOSCOPY;  Service: Endoscopy;  Laterality: N/A;  Trudee Kuster hole Right 12/06/2014    Procedure: Right burr hole  for subdural hematoma;  Surgeon: Newman Pies, MD;  Location: [facility] NEURO ORS;  Service: Neurosurgery;  Laterality: Right;    Family History  Problem Relation Age of Onset  . Diabetes Mother   . Heart disease Father   . Diabetes Sister   . Lung cancer Brother   . Cancer Brother   . Diabetes Brother   . Diabetes Sister     Social History:  reports that she has quit smoking. She has never used smokeless tobacco. She reports that she does not drink alcohol or use illicit drugs.  Allergies:  Allergies  Allergen Reactions  . Codeine Other (See Comments)    Reaction:  Hallucinations    Medications: I have reviewed the patient's current medications. Currently receiving IV Zosyn and IV Vancomycin.  Results for orders placed or performed during the hospital encounter of 12/03/14 (from the past 48 hour(s))  Glucose, capillary     Status: Abnormal   Collection Time: 12/05/14  7:44 PM  Result Value Ref Range   Glucose-Capillary 126 (H) 65 - 99 mg/dL  Glucose, capillary     Status: Abnormal   Collection Time: 12/05/14 10:01 PM  Result Value Ref Range   Glucose-Capillary 111 (H) 65 - 99 mg/dL  Glucose, capillary     Status: Abnormal  Collection Time: 12/06/14  8:16 AM  Result Value Ref Range   Glucose-Capillary 133 (H) 65 - 99 mg/dL   Comment 1 Notify RN    Comment 2 Document in Chart   Glucose, capillary     Status: Abnormal   Collection Time: 12/06/14 12:00 PM  Result Value Ref Range   Glucose-Capillary 108 (H) 65 - 99 mg/dL   Comment 1 Notify RN    Comment 2 Document in Chart   Glucose, capillary     Status: Abnormal   Collection Time: 12/06/14  4:51 PM  Result Value Ref Range   Glucose-Capillary 100 (H) 65 - 99 mg/dL  Glucose, capillary     Status: Abnormal   Collection Time: 12/06/14  9:14 PM  Result Value Ref Range   Glucose-Capillary 116 (H) 65  - 99 mg/dL  CBC     Status: Abnormal   Collection Time: 12/07/14  5:30 AM  Result Value Ref Range   WBC 6.6 4.0 - 10.5 K/uL   RBC 2.57 (L) 3.87 - 5.11 MIL/uL   Hemoglobin 6.9 (LL) 12.0 - 15.0 g/dL    Comment: REPEATED TO VERIFY CRITICAL RESULT CALLED TO, READ BACK BY AND VERIFIED WITH: E.MESSER,RN [id number] 12/07/14 M.CAMPBELL    HCT 21.3 (L) 36.0 - 46.0 %   MCV 82.9 78.0 - 100.0 fL   MCH 26.8 26.0 - 34.0 pg   MCHC 32.4 30.0 - 36.0 g/dL   RDW 18.8 (H) 11.5 - 15.5 %   Platelets 295 150 - 400 K/uL  Type and screen     Status: None (Preliminary result)   Collection Time: 12/07/14  6:53 AM  Result Value Ref Range   ABO/RH(D) O POS    Antibody Screen NEG    Sample Expiration 12/10/2014    Unit Number [id number]    Blood Component Type RED CELLS,LR    Unit division 00    Status of Unit ISSUED    Transfusion Status OK TO TRANSFUSE    Crossmatch Result Compatible    Unit Number [id number]    Blood Component Type RED CELLS,LR    Unit division 00    Status of Unit ISSUED    Transfusion Status OK TO TRANSFUSE    Crossmatch Result Compatible   Prepare RBC     Status: None   Collection Time: 12/07/14  6:53 AM  Result Value Ref Range   Order Confirmation ORDER PROCESSED BY BLOOD BANK   ABO/Rh     Status: None   Collection Time: 12/07/14  6:53 AM  Result Value Ref Range   ABO/RH(D) O POS   Glucose, capillary     Status: None   Collection Time: 12/07/14  8:06 AM  Result Value Ref Range   Glucose-Capillary 74 65 - 99 mg/dL   Comment 1 Notify RN    Comment 2 Document in Chart   Prepare RBC     Status: None   Collection Time: 12/07/14  8:13 AM  Result Value Ref Range   Order Confirmation      ORDER PROCESSED BY BLOOD BANK BLOOD ALREADY AVAILABLE  Glucose, capillary     Status: Abnormal   Collection Time: 12/07/14 11:53 AM  Result Value Ref Range   Glucose-Capillary 144 (H) 65 - 99 mg/dL   Comment 1 Notify RN    Comment 2 Document in Chart     Ct Head Wo  Contrast  12/07/2014   CLINICAL DATA:  Followup subdural hematoma.  EXAM: CT HEAD WITHOUT CONTRAST  TECHNIQUE: Contiguous axial images were obtained from the base of the skull through the vertex without intravenous contrast.  COMPARISON:  MRI of the brain December 04, 2014  FINDINGS: Interval placement of 2 RIGHT frontal burr holes, with Surgicel for evacuation of subdural hematoma, residual 10 mm RIGHT frontal mixed density extra-axial fluid collection with extra-axial pneumocephalus. Residual 5 mm RIGHT to LEFT midline shift, improved. Partially re-expanded RIGHT lateral ventricle. No hydrocephalus/ventricular entrapment. Basal cisterns are patent. No acute large vascular territory infarct.  Suspected old small suboccipital craniectomy. No acute skull fracture. Small RIGHT sphenoid sinus air-fluid level. The mastoid air cells are well aerated. Included ocular globes and orbital contents are normal.  IMPRESSION: Interval partial evacuation of RIGHT subdural hematoma via two new frontal burr holes, with 9 mm residual component, new extra-axial pneumocephalus.  5 mm residual RIGHT to LEFT midline shift, improved with partially re-expanded RIGHT lateral ventricle, no hydrocephalus/ventricular entrapment.   Electronically Signed   By: Elon Alas M.D.   On: 12/07/2014 06:36   Dg Chest Port 1 View  12/05/2014   CLINICAL DATA:  Fever. Hypertension and diabetes. Congestive heart failure. Chronic kidney disease.  EXAM: PORTABLE CHEST - 1 VIEW  COMPARISON:  10/08/2014  FINDINGS: Increased size of small bilateral pleural effusions and bibasilar infiltrates or atelectasis seen since prior study. Cardiomegaly is stable. Multiple old right rib fracture deformities again noted.  IMPRESSION: Increased small bilateral pleural effusions, and bibasilar atelectasis versus infiltrates.  Stable cardiomegaly.   Electronically Signed   By: Earle Gell M.D.   On: 12/05/2014 20:03    ROS Blood pressure 128/50, pulse 75,  temperature 98.5 F (36.9 C), temperature source Oral, resp. rate 16, height 5\' 7"  (1.702 m), weight 66 kg (145 lb 8.1 oz), SpO2 99 %. Physical Exam 69 year old black female sitting up in hospital bed She is alert but poor historian.  History regarding the foot and toe obtained from family member in the room. Does not appear to be in any distress.  Right foot and great toe examined. Sensation intact to the foot and toes. Motor function grossly intact to the right foot and toes. Open wound on the plantar aspect of the right foot started at the base of the fist web space and extending back toward the heel. The wound is about 2 cm in length.  The skin edges do not appear necrotic.  Open wound down the the fascia of the 1st MTP joint. No exposed bone tissue apparent.  No purulent discharge is noted at this exam however does have a strong odor.   Wound is redressed with a wet to dry gauze dressing and wrapped in conform.   Assessment/Plan: Right Great Toe Diabetic Wound  No know trauma causing laceration or diabetic sore that has now opened up.  Documentation eludes to the patient treating this but no idea for how long.  The husband just first recognized blood on her sock when they came into the ED at [facility]. Do not appreciate any abscess or fluctuance that needs immediate decompression.  Will check x-rays to see if any bony involvement. Dr. Wynelle Link to follow up later this evening to check on patient.  Mickel Crow 12/07/2014, 6:45 PM     I have seen and examined the patient and agree with the above findings. She has a 2 x 2 cm ulcer in the 1st plantar webspace of her right foot. It is not draining and there is no surrounding erythema. She has superficial necrosis but  underlying tissue looks healthy. Begin bid normal saline wet to dry dressing changes. Can be done at home with home health. No need for surgical intervention. Can follow-up in office in approximately 2 weeks.

## 2014-12-07 NOTE — Progress Notes (Signed)
OT Cancellation Note  Patient Details Name: [name] MRN: [id number] DOB: October 26, 1945   Cancelled Treatment:    Reason Eval/Treat Not Completed: Other (comment) Pt currently on bedrest. Please update activity orders to initiate therapy. Thanks [place], OTR/L  [phone] 12/07/2014 12/07/2014, 5:07 PM

## 2014-12-07 NOTE — Progress Notes (Signed)
Patient ID: [name], female   DOB: 16-Oct-1945, 69 y.o.   MRN: [id number] Subjective:  The patient is alert and pleasant. She is in no apparent distress. She looks well.  Objective: Vital signs in last 24 hours: Temp:  [97.9 F (36.6 C)-98.6 F (37 C)] 98.4 F (36.9 C) (08/05 0400) Pulse Rate:  [41-90] 87 (08/05 0700) Resp:  [10-27] 19 (08/05 0700) BP: (104-147)/(44-70) 125/51 mmHg (08/05 0700) SpO2:  [94 %-100 %] 100 % (08/05 0700) Arterial Line BP: (126-205)/(38-68) 155/62 mmHg (08/05 0700)  Intake/Output from previous day: 08/04 0701 - 08/05 0700 In: 1540 [I.V.:1150; IV Piggyback:350] Out: 630 [Urine:630] Intake/Output this shift:    Physical exam the patient is alert and oriented 3. She is moving all 4 extremities well. Her dressing has a bloodstained.  Lab Results:  Recent Labs  12/05/14 1835 12/07/14 0530  WBC 6.2 6.6  HGB 8.3* 6.9*  HCT 24.9* 21.3*  PLT 224 295   BMET  Recent Labs  12/05/14 1120  NA 135  K 4.8  CL 106  CO2 20*  GLUCOSE 174*  BUN 23*  CREATININE 1.16*  CALCIUM 7.8*    Studies/Results: Ct Head Wo Contrast  12/07/2014   CLINICAL DATA:  Followup subdural hematoma.  EXAM: CT HEAD WITHOUT CONTRAST  TECHNIQUE: Contiguous axial images were obtained from the base of the skull through the vertex without intravenous contrast.  COMPARISON:  MRI of the brain December 04, 2014  FINDINGS: Interval placement of 2 RIGHT frontal burr holes, with Surgicel for evacuation of subdural hematoma, residual 10 mm RIGHT frontal mixed density extra-axial fluid collection with extra-axial pneumocephalus. Residual 5 mm RIGHT to LEFT midline shift, improved. Partially re-expanded RIGHT lateral ventricle. No hydrocephalus/ventricular entrapment. Basal cisterns are patent. No acute large vascular territory infarct.  Suspected old small suboccipital craniectomy. No acute skull fracture. Small RIGHT sphenoid sinus air-fluid level. The mastoid air cells are well aerated.  Included ocular globes and orbital contents are normal.  IMPRESSION: Interval partial evacuation of RIGHT subdural hematoma via two new frontal burr holes, with 9 mm residual component, new extra-axial pneumocephalus.  5 mm residual RIGHT to LEFT midline shift, improved with partially re-expanded RIGHT lateral ventricle, no hydrocephalus/ventricular entrapment.   Electronically Signed   By: Elon Alas M.D.   On: 12/07/2014 06:36   Dg Chest Port 1 View  12/05/2014   CLINICAL DATA:  Fever. Hypertension and diabetes. Congestive heart failure. Chronic kidney disease.  EXAM: PORTABLE CHEST - 1 VIEW  COMPARISON:  10/08/2014  FINDINGS: Increased size of small bilateral pleural effusions and bibasilar infiltrates or atelectasis seen since prior study. Cardiomegaly is stable. Multiple old right rib fracture deformities again noted.  IMPRESSION: Increased small bilateral pleural effusions, and bibasilar atelectasis versus infiltrates.  Stable cardiomegaly.   Electronically Signed   By: Earle Gell M.D.   On: 12/05/2014 20:03    Assessment/Plan: Postop day #1: The patient is doing well clinically. Her postoperative head CT looks good. We will mobilize her. She will likely go home over the weekend.  LOS: 4 days     Kamree Wiens D 12/07/2014, 7:49 AM

## 2014-12-08 LAB — BASIC METABOLIC PANEL
ANION GAP: 8 (ref 5–15)
BUN: 34 mg/dL — ABNORMAL HIGH (ref 6–20)
CHLORIDE: 105 mmol/L (ref 101–111)
CO2: 19 mmol/L — AB (ref 22–32)
CREATININE: 2.02 mg/dL — AB (ref 0.44–1.00)
Calcium: 7.5 mg/dL — ABNORMAL LOW (ref 8.9–10.3)
GFR calc Af Amer: 28 mL/min — ABNORMAL LOW (ref >60)
GFR calc non Af Amer: 24 mL/min — ABNORMAL LOW (ref >60)
Glucose, Bld: 120 mg/dL — ABNORMAL HIGH (ref 65–99)
POTASSIUM: 5.3 mmol/L — AB (ref 3.5–5.1)
Sodium: 132 mmol/L — ABNORMAL LOW (ref 135–145)

## 2014-12-08 LAB — TYPE AND SCREEN
ABO/RH(D): O POS
ANTIBODY SCREEN: NEGATIVE
Unit division: 0
Unit division: 0

## 2014-12-08 LAB — URINE MICROSCOPIC-ADD ON

## 2014-12-08 LAB — GLUCOSE, CAPILLARY
GLUCOSE-CAPILLARY: 116 mg/dL — AB (ref 65–99)
GLUCOSE-CAPILLARY: 160 mg/dL — AB (ref 65–99)
GLUCOSE-CAPILLARY: 121 mg/dL — AB (ref 65–99)
Glucose-Capillary: 104 mg/dL — ABNORMAL HIGH (ref 65–99)
Glucose-Capillary: 109 mg/dL — ABNORMAL HIGH (ref 65–99)
Glucose-Capillary: 90 mg/dL (ref 65–99)

## 2014-12-08 LAB — URINALYSIS, ROUTINE W REFLEX MICROSCOPIC
Bilirubin Urine: NEGATIVE
Glucose, UA: NEGATIVE mg/dL
Ketones, ur: NEGATIVE mg/dL
Leukocytes, UA: NEGATIVE
Nitrite: NEGATIVE
Protein, ur: NEGATIVE mg/dL
Specific Gravity, Urine: 1.01 (ref 1.005–1.030)
Urobilinogen, UA: 0.2 mg/dL (ref 0.0–1.0)
pH: 5 (ref 5.0–8.0)

## 2014-12-08 LAB — VANCOMYCIN, TROUGH: Vancomycin Tr: 21 ug/mL — ABNORMAL HIGH (ref 10.0–20.0)

## 2014-12-08 MED ORDER — VANCOMYCIN HCL IN DEXTROSE 750-5 MG/150ML-% IV SOLN
750.0000 mg | INTRAVENOUS | Status: DC
  Administered 2014-12-09 – 2014-12-10 (×2): 750 mg via INTRAVENOUS
  Filled 2014-12-08 (×3): qty 150

## 2014-12-08 MED ORDER — DEXTROSE-NACL 5-0.45 % IV SOLN
INTRAVENOUS | Status: DC
  Administered 2014-12-08 – 2014-12-11 (×3): via INTRAVENOUS

## 2014-12-08 NOTE — Progress Notes (Signed)
Physical Therapy Evaluation Patient Details Name: [name] MRN: [id number] DOB: 1945/05/24 Today's Date: 12/08/2014   History of Present Illness  69 year old female was admitted with confusion and left-sided weakness. She was found to have a right subdural hematoma; underwent right bur hole evacuation 8/4.  Also with diabetic foot ulcer R great toe, plantar surface of foot.  Clinical Impression  Patient seen with sister and niece present who report baseline status as independent until about 2 weeks prior to hospital admission.  Patient currently requiring MAX assist of 2 for bed level mobility and sitting at edge of bed.  Transfer not attempted at this time.  Patient is appropriate for skilled PT services, likely to be somewhat extended recovery course.  PT to address sitting balance, transfers and eventual gait as appropriate.    Follow Up Recommendations SNF;Supervision/Assistance - 24 hour    Equipment Recommendations  None recommended by PT    Recommendations for Other Services       Precautions / Restrictions Precautions Precautions: Fall Precaution Comments: pt may weight bear on R heel per RN who spoke to PA--pt needs max A to sit EOB  Restrictions Weight Bearing Restrictions: No      Mobility  Bed Mobility Overal bed mobility: Needs Assistance;+ 2 for safety/equipment;+2 for physical assistance Bed Mobility: Rolling;Sidelying to Sit Rolling: Total assist;+2 for safety/equipment Sidelying to sit: Total assist;+2 for safety/equipment       General bed mobility comments: pt unable to sustain herself on side.  Assist for all aspects of bed mobility and to maintain sitting position at edge of bed.  Transfers                 General transfer comment: not attempted  Ambulation/Gait                Stairs            Wheelchair Mobility    Modified Rankin (Stroke Patients Only) Modified Rankin (Stroke Patients Only) Pre-Morbid Rankin Score:  No significant disability Modified Rankin: Severe disability     Balance Overall balance assessment: Needs assistance Sitting-balance support: Feet supported;Bilateral upper extremity supported Sitting balance-Leahy Scale: Zero Sitting balance - Comments: leaned on each elbow to try to help find midline.  Pt tends to lean posteriorly and right                                     Pertinent Vitals/Pain Pain Assessment: No/denies pain    Home Living Family/patient expects to be discharged to:: Private residence Living Arrangements: Spouse/significant other Available Help at Discharge: Family Type of Home: House Home Access: Stairs to enter Entrance Stairs-Rails: Can reach both Entrance Stairs-Number of Steps: 2 Home Layout: Two level;Able to live on main level with bedroom/bathroom Home Equipment: [name] - 2 wheels;Cane - single point Additional Comments: lives with spouse; up until 2 weeks ago, did all ADLs/IADLs.  sister and niece present for evaluation    Prior Function Level of Independence: Independent         Comments: until a couple of weeks ago     Hand Dominance        Extremity/Trunk Assessment   Upper Extremity Assessment: Defer to OT evaluation       LUE Deficits / Details: pt initiated flexing L elbow once at eob; did not move to command; unable to test sensation--pt could not state where she was being  touched and said yes to everything   Lower Extremity Assessment: Generalized weakness;LLE deficits/detail   LLE Deficits / Details: L LE flaccid     Communication   Communication:  (to be further assessed; decreased cognition; repeated words)  Cognition Arousal/Alertness: Lethargic (more alert when sitting EOB)   Overall Cognitive Status: Impaired/Different from baseline Area of Impairment: Attention;Following commands;Safety/judgement;Problem solving;Awareness       Following Commands: Follows one step commands  inconsistently Safety/Judgement: Decreased awareness of safety;Decreased awareness of deficits     General Comments: decreased initiation; followed some commands with multimodal cues and extra time; decreased L sided attention    General Comments      Exercises        Assessment/Plan    PT Assessment Patient needs continued PT services  PT Diagnosis Generalized weakness;Hemiplegia non-dominant side;Altered mental status   PT Problem List Decreased strength;Decreased activity tolerance;Decreased balance;Decreased mobility;Decreased coordination;Decreased cognition;Decreased safety awareness;Decreased knowledge of precautions  PT Treatment Interventions Functional mobility training;Therapeutic activities;Therapeutic exercise;Balance training;Neuromuscular re-education;Patient/family education   PT Goals (Current goals can be found in the Care Plan section) Acute Rehab PT Goals Patient Stated Goal: none stated; agreeable to therapy PT Goal Formulation: Patient unable to participate in goal setting Time For Goal Achievement: 12/22/14 Potential to Achieve Goals: Good    Frequency Min 3X/week   Barriers to discharge Decreased caregiver support;Inaccessible home environment      Co-evaluation PT/OT/SLP Co-Evaluation/Treatment: Yes Reason for Co-Treatment: Complexity of the patient's impairments (multi-system involvement);For patient/therapist safety PT goals addressed during session: Mobility/safety with mobility;Balance;Strengthening/ROM OT goals addressed during session: ADL's and self-care;Strengthening/ROM       End of Session     Patient left: in bed;with bed alarm set;with family/visitor present;with SCD's reapplied Nurse Communication: Mobility status;Precautions         Time: 1600-1630 PT Time Calculation (min) (ACUTE ONLY): 30 min   Charges:   PT Evaluation $Initial PT Evaluation Tier I: 1 Procedure     PT G CodesZenia Resides, Jakin Pavao L Dec 15, 2014, 6:04  PM

## 2014-12-08 NOTE — Progress Notes (Signed)
HYPOGLYCEMIA EPISODE  At change of shift while given bedside report patient was lying in bed with eyes closed with husband at the bedside. Later at med pass and CBG check patient BS was 17 at 2030 and was responsive to call of her name, but still seem lethargic.  1/2 D 50 was pushed IV and CBG was rechecked with BS 196. Patient was becoming more responsive. On call Dr.Nudelman was called and order Q 4 hour BS and sliding scale was D/C'd. Around 2200 patient blood sugar was rechecked and was 115. Patient was more responsive and talking while watching tv. CBG at 0000 was 104 and rapid response was called. Advice was given to call MD to request IV fluids with D5 and vital signs. Dr. Sherwood Gambler was recalled and new orders were placed. Patient last blood sugar at 0400 115ish. Patient is currently sleeping with husband at the bedside.

## 2014-12-08 NOTE — Progress Notes (Signed)
Subjective: Patient reports Doing better this morning had some difficulty with her diabetes last night sugars better controlled currently complaining of mild headache but otherwise nonfocal  Objective: Vital signs in last 24 hours: Temp:  [98 F (36.7 C)-98.7 F (37.1 C)] 98.4 F (36.9 C) (08/06 0000) Pulse Rate:  [67-108] 95 (08/06 0000) Resp:  [11-19] 16 (08/05 2152) BP: (96-146)/(47-79) 138/60 mmHg (08/06 0000) SpO2:  [95 %-100 %] 95 % (08/06 0000) Arterial Line BP: (164)/(72) 164/72 mmHg (08/05 0900)  Intake/Output from previous day: 08/05 0701 - 08/06 0700 In: 2750 [P.O.:1440; I.V.:450; Blood:710; IV Piggyback:150] Out: 255 [Urine:255] Intake/Output this shift:    Awake alert neurologically stable subtle left hemiparesis  Lab Results:  Recent Labs  12/05/14 1835 12/07/14 0530  WBC 6.2 6.6  HGB 8.3* 6.9*  HCT 24.9* 21.3*  PLT 224 295   BMET  Recent Labs  12/05/14 1120 12/08/14 0434  NA 135 132*  K 4.8 5.3*  CL 106 105  CO2 20* 19*  GLUCOSE 174* 120*  BUN 23* 34*  CREATININE 1.16* 2.02*  CALCIUM 7.8* 7.5*    Studies/Results: Ct Head Wo Contrast  12/07/2014   CLINICAL DATA:  Followup subdural hematoma.  EXAM: CT HEAD WITHOUT CONTRAST  TECHNIQUE: Contiguous axial images were obtained from the base of the skull through the vertex without intravenous contrast.  COMPARISON:  MRI of the brain December 04, 2014  FINDINGS: Interval placement of 2 RIGHT frontal burr holes, with Surgicel for evacuation of subdural hematoma, residual 10 mm RIGHT frontal mixed density extra-axial fluid collection with extra-axial pneumocephalus. Residual 5 mm RIGHT to LEFT midline shift, improved. Partially re-expanded RIGHT lateral ventricle. No hydrocephalus/ventricular entrapment. Basal cisterns are patent. No acute large vascular territory infarct.  Suspected old small suboccipital craniectomy. No acute skull fracture. Small RIGHT sphenoid sinus air-fluid level. The mastoid air cells are  well aerated. Included ocular globes and orbital contents are normal.  IMPRESSION: Interval partial evacuation of RIGHT subdural hematoma via two new frontal burr holes, with 9 mm residual component, new extra-axial pneumocephalus.  5 mm residual RIGHT to LEFT midline shift, improved with partially re-expanded RIGHT lateral ventricle, no hydrocephalus/ventricular entrapment.   Electronically Signed   By: Elon Alas M.D.   On: 12/07/2014 06:36    Assessment/Plan: Continue to monitor CBGs mobilized she did eat a good breakfast continue with physical therapy  LOS: 5 days     [name] P 12/08/2014, 8:39 AM

## 2014-12-08 NOTE — Evaluation (Signed)
Occupational Therapy Evaluation Patient Details Name: [name] MRN: [id number] DOB: 1946/04/10 Today's Date: 12/08/2014    History of Present Illness 69 year old female was admitted with confusion and left-sided weakness. She was found to have a right subdural hematoma; underwent right bur hole evacuation 8/4.   Clinical Impression   Pt was admitted for the above.  At baseline, she is independent with adls.  Pt currently needs total A of one to 2 for ADLs except for grooming. She will benefit from skilled OT to increase safety and independence with adls.  Goals will focus on grooming, unsupported sitting, rolling and attending to [place].      Follow Up Recommendations  SNF    Equipment Recommendations   (to be further assessed)    Recommendations for Other Services       Precautions / Restrictions Precautions Precautions: Fall Precaution Comments: pt may weight bear on R heel per RN who spoke to PA--pt needs max A to sit EOB  Restrictions Weight Bearing Restrictions: No      Mobility Bed Mobility Overal bed mobility: Needs Assistance;+ 2 for safety/equipment Bed Mobility: Rolling;Sidelying to Sit Rolling: Total assist;+2 for safety/equipment Sidelying to sit: Total assist;+2 for safety/equipment       General bed mobility comments: pt unable to sustain herself on side.  Assist for all aspects of bed mobility  Transfers                 General transfer comment: not attempted    Balance Overall balance assessment: Needs assistance Sitting-balance support: Feet supported;Bilateral upper extremity supported Sitting balance-Leahy Scale: Zero Sitting balance - Comments: leaned on each elbow to try to help find midline.  Pt tends to lean posteriorly and right                                    ADL Overall ADL's : Needs assistance/impaired     Grooming: Wash/dry face;Moderate assistance;Bed level                                  General ADL Comments: pt needs total A for UB adls and total A x 2 for LB adls from bed level.  Needed max to total A for balance at EOB     Vision     Perception     Praxis      Pertinent Vitals/Pain Pain Assessment: No/denies pain     Hand Dominance     Extremity/Trunk Assessment Upper Extremity Assessment Upper Extremity Assessment: LUE deficits/detail LUE Deficits / Details: pt initiated flexing L elbow once at eob; did not move to command; unable to test sensation--pt could not state where she was being touched and said yes to everything           Communication Communication Communication:  To be further assessed for expression/reception; has decreased cognition; repeated words; mostly yes/no stated and yes ma'am)   Cognition Arousal/Alertness:  (more alert when sitting EOB)   Overall Cognitive Status: Impaired/Different from baseline Area of Impairment: Attention;Following commands;Safety/judgement;Problem solving;Awareness               General Comments: decreased initiation; followed some commands with multimodal cues and extra time; decreased L sided attention   General Comments       Exercises       Shoulder Instructions  Home Living                                   Additional Comments: lives with spouse; up until 2 weeks ago, did all ADLs/IADLs.  sister and niece present for evaluation      Prior Functioning/Environment Level of Independence: Independent        Comments: until a couple of weeks ago    OT Diagnosis: Generalized weakness;Cognitive deficits   OT Problem List: Decreased strength;Decreased activity tolerance;Impaired balance (sitting and/or standing);Decreased cognition;Decreased safety awareness;Impaired sensation;Impaired UE functional use   OT Treatment/Interventions: Self-care/ADL training;DME and/or AE instruction;Balance training;Patient/family education;Visual/perceptual  remediation/compensation;Cognitive remediation/compensation;Therapeutic activities;Neuromuscular education    OT Goals(Current goals can be found in the care plan section) Acute Rehab OT Goals Patient Stated Goal: none stated; agreeable to therapy OT Goal Formulation: With patient/family Time For Goal Achievement: 12/22/14 Potential to Achieve Goals: Good ADL Goals Pt Will Perform Grooming: with min assist;sitting (supported) Additional ADL Goal #1: pt will sit unsupported EOB x 3 minutes with min A in preparation for ADLs Additional ADL Goal #2: Pt will attend to LUE during adls with mod multimodal cues Additional ADL Goal #3: pt will assist with rolling at max A level in preparation for adls  OT Frequency: Min 2X/week   Barriers to D/C:            Co-evaluation PT/OT/SLP Co-Evaluation/Treatment: Yes Reason for Co-Treatment: Complexity of the patient's impairments (multi-system involvement);For patient/therapist safety   OT goals addressed during session: ADL's and self-care;Strengthening/ROM      End of Session    Activity Tolerance:   Patient left: in bed;with call bell/phone within reach;with bed alarm set;with family/visitor present   Time: 6415-8309 OT Time Calculation (min): 33 min Charges:  OT General Charges $OT Visit: 1 Procedure OT Evaluation $Initial OT Evaluation Tier I: 1 Procedure G-Codes:    [name] Dec 20, 2014, 4:44 PM  Lesle Chris, OTR/L [phone] 12-20-14

## 2014-12-08 NOTE — Progress Notes (Signed)
ANTIBIOTIC CONSULT NOTE - FOLLOW UP  Pharmacy Consult for Vanc/Zosyn Indication: rule out pneumonia  Allergies  Allergen Reactions  . Codeine Other (See Comments)    Reaction:  Hallucinations    Assessment: 68yoF admitted 12/03/2014 for L sided weakness and confusion. Head CT showed right SDH. Pt had Tmax of 101.7 on 8/3, and CXR showed possible PNA. Of note, she has a diabetic ulcer on foot that opened- no surgical intervention, wet to dry dressing changes.   Pt has been afebrile since 8/4 and WBC wnl. Scr bumped from 1.16>2.02 in last three days. Vanc tr this AM = 21.  May consider d/c abx due to pt's clinical status and decreasing renal function.  Goal of Therapy:  Vancomycin trough level 15-20 mcg/ml  Plan:  - Will change Vanc 750mg  IV q24h - Continue zosyn 3.375gm IV Q8H (4 hr inf) - F/u renal fxn, C&S, clinical status and trough at [place], PharmD. Clinical Pharmacist Resident Pager: [phone]       Patient Measurements: Height: 5\' 7"  (170.2 cm) Weight: 145 lb 8.1 oz (66 kg) IBW/kg (Calculated) : 61.6   Vital Signs: Temp: 100.6 F (38.1 C) (08/06 0919) Temp Source: Oral (08/06 0919) BP: 148/64 mmHg (08/06 0919) Pulse Rate: 115 (08/06 0919) Intake/Output from previous day: 08/05 0701 - 08/06 0700 In: 2750 [P.O.:1440; I.V.:450; Blood:710; IV Piggyback:150] Out: 255 [Urine:255] Intake/Output from this shift:    Labs:  Recent Labs  12/05/14 1835 12/07/14 0530 12/08/14 0434  WBC 6.2 6.6  --   HGB 8.3* 6.9*  --   PLT 224 295  --   CREATININE  --   --  2.02*   Estimated Creatinine Clearance: 25.6 mL/min (by C-G formula based on Cr of 2.02).  Recent Labs  12/08/14 1155  [place] 21*     Microbiology: Recent Results (from the past 720 hour(s))  Blood culture (routine x 2)     Status: None   Collection Time: 11/30/14  9:34 PM  Result Value Ref Range Status   Specimen Description BLOOD RIGHT HAND  Final   Special Requests BOTTLES  DRAWN AEROBIC AND ANAEROBIC 2CC  Final   Culture NO GROWTH 5 DAYS  Final   Report Status 12/06/2014 FINAL  Final  Blood culture (routine x 2)     Status: None   Collection Time: 11/30/14  9:34 PM  Result Value Ref Range Status   Specimen Description BLOOD LEFT ASSIST CONTROL  Final   Special Requests BOTTLES DRAWN AEROBIC AND ANAEROBIC 2CC  Final   Culture NO GROWTH 5 DAYS  Final   Report Status 12/06/2014 FINAL  Final  Wound culture     Status: None   Collection Time: 11/30/14  9:34 PM  Result Value Ref Range Status   Specimen Description FOOT  Final   Special Requests NONE  Final   Gram Stain   Final    FEW WBC SEEN MANY GRAM POSITIVE RODS MODERATE GRAM POSITIVE COCCI IN CLUSTERS IN PAIRS FEW GRAM NEGATIVE RODS    Culture   Final    HEAVY GROWTH ENTEROBACTER CLOACAE HEAVY GROWTH KLEBSIELLA PNEUMONIAE WITH NORMAL SKIN FLORA    Report Status 12/04/2014 FINAL  Final   Organism ID, Bacteria KLEBSIELLA PNEUMONIAE  Final   Organism ID, Bacteria ENTEROBACTER CLOACAE  Final      Susceptibility   Enterobacter cloacae - MIC*    PIP/TAZO Value in next row Sensitive      SENSITIVE<=4    CEFTAZIDIME Value in  next row Sensitive      SENSITIVE<=1    CEFEPIME Value in next row Sensitive      SENSITIVE<=1    CEFTRIAXONE Value in next row Sensitive      SENSITIVE<=1    CIPROFLOXACIN Value in next row Sensitive      SENSITIVE<=0.25    GENTAMICIN Value in next row Sensitive      SENSITIVE<=1    IMIPENEM Value in next row Sensitive      SENSITIVE0.5    TRIMETH/SULFA Value in next row Sensitive      SENSITIVE<=20    CEFAZOLIN Value in next row Resistant      RESISTANT>=64    * HEAVY GROWTH ENTEROBACTER CLOACAE   Klebsiella pneumoniae - MIC*    AMPICILLIN Value in next row Resistant      RESISTANT>=64    CEFTAZIDIME Value in next row Sensitive      RESISTANT>=64    CEFAZOLIN Value in next row Sensitive      RESISTANT>=64    CEFTRIAXONE Value in next row Sensitive       RESISTANT>=64    CIPROFLOXACIN Value in next row Sensitive      RESISTANT>=64    GENTAMICIN Value in next row Sensitive      RESISTANT>=64    IMIPENEM Value in next row Sensitive      RESISTANT>=64    TRIMETH/SULFA Value in next row Sensitive      RESISTANT>=64    PIP/TAZO Value in next row Sensitive      SENSITIVE<=4    * HEAVY GROWTH KLEBSIELLA PNEUMONIAE  MRSA PCR Screening     Status: None   Collection Time: 12/03/14  7:46 PM  Result Value Ref Range Status   MRSA by PCR NEGATIVE NEGATIVE Final    Comment:        The GeneXpert MRSA Assay (FDA approved for NASAL specimens only), is one component of a comprehensive MRSA colonization surveillance program. It is not intended to diagnose MRSA infection nor to guide or monitor treatment for MRSA infections.     Anti-infectives    Start     Dose/Rate Route Frequency Ordered Stop   12/06/14 1500  bacitracin 50,000 Units in sodium chloride irrigation 0.9 % 500 mL irrigation  Status:  Discontinued       As needed 12/06/14 1557 12/06/14 1613   12/06/14 1000  vancomycin (VANCOCIN) 500 mg in sodium chloride 0.9 % 100 mL IVPB  Status:  Discontinued     500 mg 100 mL/hr over 60 Minutes Intravenous Every 12 hours 12/05/14 2130 12/08/14 0903   12/05/14 2300  vancomycin (VANCOCIN) 1,250 mg in sodium chloride 0.9 % 250 mL IVPB     1,250 mg 166.7 mL/hr over 90 Minutes Intravenous  Once 12/05/14 2130 12/06/14 0043   12/05/14 2200  piperacillin-tazobactam (ZOSYN) IVPB 3.375 g     3.375 g 12.5 mL/hr over 240 Minutes Intravenous 3 times per day 12/05/14 2129

## 2014-12-08 NOTE — Care Management Note (Signed)
Case Management Note  Patient Details  Name: ARRIETTY DERCOLE MRN: [id number] Date of Birth: 11-18-1945  Subjective/Objective:                    Action/Plan:   Expected Discharge Date:                  Expected Discharge Plan:     In-House Referral:     Discharge planning Services     Post Acute Care Choice:    Choice offered to:     DME Arranged:    DME Agency:     HH Arranged:    [place] Agency:     Status of Service:     Medicare Important Message Given:   yes Date Medicare IM Given:   12/08/14 Medicare IM give by:   Frann Rider, RN, BSN Date Additional Medicare IM Given:    Additional Medicare Important Message give by:     If discussed at [place] of Stay Meetings, dates discussed:    Additional Comments:  Norina Buzzard, RN 12/08/2014, 12:35 PM

## 2014-12-09 LAB — GLUCOSE, CAPILLARY
Glucose-Capillary: 111 mg/dL — ABNORMAL HIGH (ref 65–99)
Glucose-Capillary: 138 mg/dL — ABNORMAL HIGH (ref 65–99)
Glucose-Capillary: 152 mg/dL — ABNORMAL HIGH (ref 65–99)
Glucose-Capillary: 187 mg/dL — ABNORMAL HIGH (ref 65–99)
Glucose-Capillary: 82 mg/dL (ref 65–99)
Glucose-Capillary: 94 mg/dL (ref 65–99)

## 2014-12-09 LAB — CBC WITH DIFFERENTIAL/PLATELET
Basophils Absolute: 0 10*3/uL (ref 0.0–0.1)
Basophils Relative: 0 % (ref 0–1)
Eosinophils Absolute: 0.2 10*3/uL (ref 0.0–0.7)
Eosinophils Relative: 3 % (ref 0–5)
HCT: 27 % — ABNORMAL LOW (ref 36.0–46.0)
Hemoglobin: 9.1 g/dL — ABNORMAL LOW (ref 12.0–15.0)
Lymphocytes Relative: 19 % (ref 12–46)
Lymphs Abs: 1.2 10*3/uL (ref 0.7–4.0)
MCH: 27.4 pg (ref 26.0–34.0)
MCHC: 33.7 g/dL (ref 30.0–36.0)
MCV: 81.3 fL (ref 78.0–100.0)
Monocytes Absolute: 0.3 10*3/uL (ref 0.1–1.0)
Monocytes Relative: 6 % (ref 3–12)
Neutro Abs: 4.3 10*3/uL (ref 1.7–7.7)
Neutrophils Relative %: 72 % (ref 43–77)
Platelets: 289 10*3/uL (ref 150–400)
RBC: 3.32 MIL/uL — ABNORMAL LOW (ref 3.87–5.11)
RDW: 17.5 % — ABNORMAL HIGH (ref 11.5–15.5)
WBC: 6 10*3/uL (ref 4.0–10.5)

## 2014-12-09 LAB — BASIC METABOLIC PANEL
Anion gap: 6 (ref 5–15)
BUN: 35 mg/dL — AB (ref 6–20)
CHLORIDE: 107 mmol/L (ref 101–111)
CO2: 20 mmol/L — AB (ref 22–32)
Calcium: 7.6 mg/dL — ABNORMAL LOW (ref 8.9–10.3)
Creatinine, Ser: 1.69 mg/dL — ABNORMAL HIGH (ref 0.44–1.00)
GFR calc Af Amer: 35 mL/min — ABNORMAL LOW (ref >60)
GFR, EST NON AFRICAN AMERICAN: 30 mL/min — AB (ref >60)
GLUCOSE: 96 mg/dL (ref 65–99)
Potassium: 5 mmol/L (ref 3.5–5.1)
Sodium: 133 mmol/L — ABNORMAL LOW (ref 135–145)

## 2014-12-09 MED ORDER — BISACODYL 10 MG RE SUPP
10.0000 mg | Freq: Once | RECTAL | Status: AC
  Administered 2014-12-09: 10 mg via RECTAL
  Filled 2014-12-09: qty 1

## 2014-12-09 NOTE — Progress Notes (Signed)
Filed Vitals:   12/08/14 2122 12/09/14 0121 12/09/14 0535 12/09/14 0913  BP: 144/64 160/83 149/80 168/73  Pulse: 116 110 108 107  Temp: 101.7 F (38.7 C) 100.1 F (37.8 C) 98.8 F (37.1 C) 99.3 F (37.4 C)  TempSrc: Oral Oral Oral Oral  Resp: 18 18 16 16   Height:      Weight:      SpO2: 99% 100% 100% 100%    CBC  Recent Labs  12/07/14 0530 12/08/14 2350  WBC 6.6 6.0  HGB 6.9* 9.1*  HCT 21.3* 27.0*  PLT 295 289   BMET  Recent Labs  12/08/14 0434 12/09/14 0700  NA 132* 133*  K 5.3* 5.0  CL 105 107  CO2 19* 20*  GLUCOSE 120* 96  BUN 34* 35*  CREATININE 2.02* 1.69*  CALCIUM 7.5* 7.6*    Patient resting in bed, comfortable. Wounds clean and dry. Has had some low-grade fevers, but continues on vancomycin and Zosyn for suspected pneumonia.  Plan: Have asked nursing staff to send urinalysis and urine culture if not done within the past 2 days, and also to initiate incentive spirometry every hour.  Hosie Spangle, MD 12/09/2014, 9:45 AM

## 2014-12-10 LAB — URINE CULTURE
Culture: NO GROWTH
Special Requests: NORMAL

## 2014-12-10 LAB — GLUCOSE, CAPILLARY
GLUCOSE-CAPILLARY: 99 mg/dL (ref 65–99)
GLUCOSE-CAPILLARY: 110 mg/dL — AB (ref 65–99)
Glucose-Capillary: 105 mg/dL — ABNORMAL HIGH (ref 65–99)
Glucose-Capillary: 114 mg/dL — ABNORMAL HIGH (ref 65–99)
Glucose-Capillary: 125 mg/dL — ABNORMAL HIGH (ref 65–99)
Glucose-Capillary: 126 mg/dL — ABNORMAL HIGH (ref 65–99)
Glucose-Capillary: 86 mg/dL (ref 65–99)

## 2014-12-10 NOTE — Progress Notes (Signed)
Physical Therapy Treatment Patient Details Name: [name] MRN: [id number] DOB: 1945/06/24 Today's Date: 12/10/2014    History of Present Illness 69 year old female was admitted with confusion and left-sided weakness. She was found to have a right subdural hematoma; underwent right bur hole evacuation 8/4.  Also with diabetic foot ulcer R great toe, plantar surface of foot.    PT Comments    Pt with severe L sided neglect, R lateral lean, L hemiparesis, and lethargy. Pt did tolerate standing this date with maxAx2 however was unable to maintain. Pt con't to require maximal assist for all mobility. Con't to recommend SNF upon d/c for maximal functional recovery.   Follow Up Recommendations  SNF;Supervision/Assistance - 24 hour     Equipment Recommendations  None recommended by PT    Recommendations for Other Services       Precautions / Restrictions Precautions Precautions: Fall Precaution Comments: L sided neglect Restrictions Weight Bearing Restrictions: No (per RN, MD reports WBAT on R LE)    Mobility  Bed Mobility               General bed mobility comments: pt up in chair  Transfers Overall transfer level: Needs assistance Equipment used:  (2 person dependent lift with gait belt and bed pad) Transfers: Sit to/from Stand Sit to Stand: +2 physical assistance;Max assist         General transfer comment: completed 2 trials, pt with minimal initiation. max A at posterior buttocks to achieve upright posture. unable to maintain > 5 sec. tolerated 30 sec total x 2. pt unable to step. L UE flaccid and pt unable to use functional. Pt with strong lean to the R and noted L sided neglect.  Ambulation/Gait                 Stairs            Wheelchair Mobility    Modified Rankin (Stroke Patients Only) Modified Rankin (Stroke Patients Only) Pre-Morbid Rankin Score: No significant disability Modified Rankin: Severe disability     Balance Overall  balance assessment: Needs assistance Sitting-balance support: Feet supported;Single extremity supported Sitting balance-Leahy Scale: Zero Sitting balance - Comments: strong lean to the R                            Cognition Arousal/Alertness: Lethargic Behavior During Therapy: Flat affect Overall Cognitive Status: Impaired/Different from baseline Area of Impairment: Attention;Following commands;Safety/judgement;Awareness;Problem solving   Current Attention Level: Focused Memory: Decreased short-term memory Following Commands: Follows one step commands with increased time Safety/Judgement: Decreased awareness of safety;Decreased awareness of deficits Awareness: Intellectual Problem Solving: Slow processing;Requires verbal cues;Requires tactile cues General Comments: pt does not turn head even with cuing to the the L. when assisting patient with turning head to L pt's eyes stay in R gaze    Exercises      General Comments        Pertinent Vitals/Pain Pain Assessment: No/denies pain    Home Living                      Prior Function            PT Goals (current goals can now be found in the care plan section) Acute Rehab PT Goals Patient Stated Goal: none stated Progress towards PT goals: Progressing toward goals    Frequency  Min 3X/week    PT Plan Current plan remains appropriate  Co-evaluation             End of Session Equipment Utilized During Treatment: Gait belt Activity Tolerance: Patient limited by lethargy Patient left: in chair;in CPM;with nursing/sitter in room     Time: 1214-1243 PT Time Calculation (min) (ACUTE ONLY): 29 min  Charges:  $Therapeutic Activity: 23-37 mins                    G Codes:      Kingsley Callander 12/10/2014, 1:28 PM   Kittie Plater, PT, DPT Pager #: [phone] Office #: [phone]

## 2014-12-10 NOTE — Care Management Important Message (Signed)
Important Message  Patient Details  Name: [name] MRN: [id number] Date of Birth: 1945-12-01   Medicare Important Message Given:  Yes-second notification given    Delorse Lek 12/10/2014, 4:04 PM

## 2014-12-10 NOTE — Clinical Social Work Placement (Signed)
   CLINICAL SOCIAL WORK PLACEMENT  NOTE  Date:  12/10/2014  Patient Details  Name: [name] MRN: [id number] Date of Birth: 08-30-1945  Clinical Social Work is seeking post-discharge placement for this patient at the [place] level of care (*CSW will initial, date and re-position this form in  chart as items are completed):  Yes   Patient/family provided with [facility] Work Department's list of facilities offering this level of care within the geographic area requested by the patient (or if unable, by the patient's family).  Yes   Patient/family informed of their freedom to choose among providers that offer the needed level of care, that participate in Medicare, Medicaid or managed care program needed by the patient, have an available bed and are willing to accept the patient.  Yes   Patient/family informed of [facility]'s ownership interest in [facility] and [facility], as well as of the fact that they are under no obligation to receive care at these facilities.  PASRR submitted to EDS on 12/10/14     PASRR number received on 12/10/14     Existing PASRR number confirmed on       FL2 transmitted to all facilities in geographic area requested by pt/family on 12/10/14     FL2 transmitted to all facilities within larger geographic area on       Patient informed that his/her managed care company has contracts with or will negotiate with certain facilities, including the following:            Patient/family informed of bed offers received.  Patient chooses bed at       Physician recommends and patient chooses bed at      Patient to be transferred to   on  .  Patient to be transferred to facility by       Patient family notified on   of transfer.  Name of family member notified:        PHYSICIAN       Additional Comment:    _______________________________________________ Greta Doom, LCSW 12/10/2014, 12:24 PM

## 2014-12-10 NOTE — Progress Notes (Signed)
SLP Cancellation Note  Patient Details Name: DONNAMARIE SHANKLES MRN: [id number] DOB: 02/05/1946   Cancelled treatment:       Reason Eval/Treat Not Completed: Fatigue/lethargy limiting ability to participate   Johanan Skorupski,PAT, M.S, CCC-SLP 12/10/2014, 1:24 PM

## 2014-12-10 NOTE — Progress Notes (Signed)
Pt sitting up in the chair at this time. No noted distress. Call bell within reach. She denies pain or discomfort. Will continue to monitor.

## 2014-12-10 NOTE — Progress Notes (Signed)
Filed Vitals:   12/09/14 2148 12/09/14 2252 12/10/14 0211 12/10/14 0609  BP: 146/91 163/63 136/74 178/59  Pulse:  84 79 79  Temp: 101.2 F (38.4 C) 99.7 F (37.6 C) 98.4 F (36.9 C) 99.7 F (37.6 C)  TempSrc: Oral Oral Axillary Oral  Resp: 18  16 16   Height:      Weight:      SpO2: 93%  95% 99%    CBC  Recent Labs  12/08/14 2350  WBC 6.0  HGB 9.1*  HCT 27.0*  PLT 289   BMET  Recent Labs  12/08/14 0434 12/09/14 0700  NA 132* 133*  K 5.3* 5.0  CL 105 107  CO2 19* 20*  GLUCOSE 120* 96  BUN 34* 35*  CREATININE 2.02* 1.69*  CALCIUM 7.5* 7.6*    Patient resting in bed, comfortable. Wounds healing well. Still some low-grade fevers. Continues on antibiotics. Patient has not been getting out of bed to sit up in chair for meals nor has she been ambulating. Discussed with staff the importance of progressing mobility and activity. Continuing PT, OT, and speech therapy   Plan: Stable neurologically. We'll check CT brain without contrast in a.m. Continuing therapies.  Hosie Spangle, MD 12/10/2014, 8:56 AM

## 2014-12-10 NOTE — Clinical Social Work Note (Signed)
Clinical Social Work Assessment  Patient Details  Name: [name] MRN: [id number] Date of Birth: [birth year]  Date of referral:  12/10/14               Reason for consult:  Facility Placement                Housing/Transportation Living arrangements for the past 2 months:  Single Family Home Source of Information:  Spouse Patient Interpreter Needed:  None Criminal Activity/Legal Involvement Pertinent to Current Situation/Hospitalization:  No - Comment as needed Significant Relationships:  Adult Children, Spouse Lives with:  Spouse Do you feel safe going back to the place where you live?  No Need for family participation in patient care:  Yes (Comment)  Care giving concerns:  Pt's husband reported that he works part-time and can not provide 24 care for the pt.    Social Worker assessment / plan: CSW met the pt and the pt;'s husband [name] at bedside. CSW introduced self and purpose of the visit. CSW discussed SNF rehab. CSW explained the SNF process. CSW explained insurance and its relation to SNF placement. CSW provided the [name] with a SNF list. [name] asked the CSW to speak with the pt's son [name] regrading SNF placement. CSW left a voice message for [place]. CSW answered all questions in which the [name] inquired about. CSW will continue to follow this pt and assist with discharge as needed.   Employment status:  Unemployed Forensic scientist:  Managed Care PT Recommendations:  [place] / Referral to community resources:  [place]  Patient/Family's Response to care:  [name] reported that care in which the pt is receiving has been well.   Patient/Family's Understanding of and Emotional Response to Diagnosis, Current Treatment, and Prognosis:  [name] acknowledged the pt's current diagnosis. [name] reported that he has cared for the pt at home the best he can. [name] is in agreement to the pt receiving rehab prior to returning home.    Emotional Assessment Appearance:  Appears stated age Attitude/Demeanor/Rapport:   (Sleeping ) Affect (typically observed):   (Sleep) Orientation:   (Sleep ) Alcohol / Substance use:  Not Applicable Psych involvement (Current and /or in the community):  No (Comment)  Discharge Needs  Concerns to be addressed:  Denies Needs/Concerns at this time Readmission within the last 30 days:  No Current discharge risk:  None Barriers to Discharge:  No Barriers Identified   Zehra Rucci, LCSW 12/10/2014, 8:05 AM

## 2014-12-11 ENCOUNTER — Inpatient Hospital Stay (HOSPITAL_COMMUNITY): Payer: Medicare HMO

## 2014-12-11 LAB — URINALYSIS, ROUTINE W REFLEX MICROSCOPIC
BILIRUBIN URINE: NEGATIVE
GLUCOSE, UA: NEGATIVE mg/dL
KETONES UR: NEGATIVE mg/dL
Leukocytes, UA: NEGATIVE
NITRITE: NEGATIVE
PROTEIN: NEGATIVE mg/dL
Specific Gravity, Urine: 1.01 (ref 1.005–1.030)
UROBILINOGEN UA: 0.2 mg/dL (ref 0.0–1.0)
pH: 5.5 (ref 5.0–8.0)

## 2014-12-11 LAB — URINE MICROSCOPIC-ADD ON

## 2014-12-11 LAB — GLUCOSE, CAPILLARY
GLUCOSE-CAPILLARY: 123 mg/dL — AB (ref 65–99)
Glucose-Capillary: 84 mg/dL (ref 65–99)
Glucose-Capillary: 85 mg/dL (ref 65–99)
Glucose-Capillary: 117 mg/dL — ABNORMAL HIGH (ref 65–99)
Glucose-Capillary: 152 mg/dL — ABNORMAL HIGH (ref 65–99)

## 2014-12-11 MED ORDER — ADULT MULTIVITAMIN W/MINERALS CH
1.0000 | ORAL_TABLET | Freq: Every day | ORAL | Status: DC
  Administered 2014-12-11 – 2014-12-13 (×3): 1 via ORAL
  Filled 2014-12-11 (×3): qty 1

## 2014-12-11 MED ORDER — GLUCERNA SHAKE PO LIQD
237.0000 mL | Freq: Three times a day (TID) | ORAL | Status: DC
  Administered 2014-12-11 – 2014-12-13 (×4): 237 mL via ORAL

## 2014-12-11 MED ORDER — INSULIN ASPART 100 UNIT/ML ~~LOC~~ SOLN
0.0000 [IU] | Freq: Three times a day (TID) | SUBCUTANEOUS | Status: DC
  Administered 2014-12-12: 1 [IU] via SUBCUTANEOUS
  Administered 2014-12-12 – 2014-12-13 (×2): 2 [IU] via SUBCUTANEOUS

## 2014-12-11 MED ORDER — HYDRALAZINE HCL 50 MG PO TABS
50.0000 mg | ORAL_TABLET | Freq: Three times a day (TID) | ORAL | Status: DC
  Administered 2014-12-11 – 2014-12-13 (×7): 50 mg via ORAL
  Filled 2014-12-11 (×7): qty 1

## 2014-12-11 MED ORDER — INSULIN ASPART 100 UNIT/ML ~~LOC~~ SOLN: 0.0000 [IU] | Freq: Every day | SUBCUTANEOUS | Status: DC

## 2014-12-11 NOTE — Progress Notes (Signed)
Nutrition Follow-up  DOCUMENTATION CODES:   Not applicable  INTERVENTION:  Continue Glucerna Shake po TID, each supplement provides 220 kcal and 10 grams of protein Provide Multivitamin with minerals daily Encourage PO intake with protein-rich foods at each meal  Recommend providing 500 mg of Vitamin C daily   NUTRITION DIAGNOSIS:   Increased nutrient needs related to wound healing (pending post op state) as evidenced by estimated needs.  Ongoing  GOAL:   Patient will meet greater than or equal to 90% of their needs  Likely Being Met  MONITOR:   PO intake, Supplement acceptance, Labs, Weight trends, Skin, I & O's  REASON FOR ASSESSMENT:   Malnutrition Screening Tool    ASSESSMENT:   69 y.o. Female with mental status changes intermittently over last couple of weeks. She was seen at the [facility] ER and a CT scan was obtained. This demonstrated a right subdural hematoma. The patient was admitted by neurology and worked up further with a brain MRI which again demonstrated a right subdural hematoma. It was no evidence of stroke  Pt states that her appetite is good and she is eating >/=75% of meals and drinking Glucerna Shakes twice daily. She reports liking Glucerna and drinking it PTA. Per physical exam, pt has moderate muscle wasting of her temples and hands, mild muscle wasting of her clavicles. She states that she usually weighs 105 lbs; however, weight history shows that pt weighed 171 lbs less than one year ago- 15% weight loss.  RD encouraged PO intake with protein-rich foods at each meal.  Labs: low sodium, low hemoglobin   Diet Order:  Diet Carb Modified Fluid consistency:: Thin; Room service appropriate?: Yes  Skin:  Wound (see comment) (Stage III Pressure ulcer on sacrum; DTI to left foot, unstageable pressure ulcer on right foot; closed head incision)  Last BM:  8/9  Height:   Ht Readings from Last 1 Encounters:  12/03/14 5' 7"   (1.702 m)    Weight:   Wt Readings from Last 1 Encounters:  12/03/14 145 lb 8.1 oz (66 kg)    Ideal Body Weight:  61.3 kg  BMI:  Body mass index is 22.78 kg/(m^2).  Estimated Nutritional Needs:   Kcal:  1600-1800  Protein:  80-90 gm  Fluid:  1.6-1.8 L  EDUCATION NEEDS:   No education needs identified at this time  [place], LDN Inpatient Clinical Dietitian Pager: [phone] After Hours Pager: [phone]

## 2014-12-11 NOTE — Progress Notes (Signed)
Patient ID: [name], female   DOB: 11-09-1945, 69 y.o.   MRN: [id number] Subjective:  The patient is alert and pleasant. She is in no apparent distress. By report she has not mobilized well even with assistance. She has a stage IV sacral decubitus. She is not able to get to the restroom to urinate so the Foley catheter has remained in place.  Objective: Vital signs in last 24 hours: Temp:  [98 F (36.7 C)-100.1 F (37.8 C)] 100.1 F (37.8 C) (08/09 0512) Pulse Rate:  [65-84] 84 (08/09 0512) Resp:  [14-16] 16 (08/09 0512) BP: (133-179)/(50-80) 154/52 mmHg (08/09 0550) SpO2:  [93 %-100 %] 95 % (08/09 0512)  Intake/Output from previous day: 08/08 0701 - 08/09 0700 In: -  Out: 1100 [Urine:1100] Intake/Output this shift:    Physical exam the patient is alert and oriented 3. She is moving all 4 extremities well. Her wounds are healing well.  Lab Results:  Recent Labs  12/08/14 2350  WBC 6.0  HGB 9.1*  HCT 27.0*  PLT 289   BMET  Recent Labs  12/09/14 0700  NA 133*  K 5.0  CL 107  CO2 20*  GLUCOSE 96  BUN 35*  CREATININE 1.69*  CALCIUM 7.6*    Studies/Results: No results found.  Assessment/Plan: Postop day #6: The patient is doing well neurologically. We'll recheck a head CT this morning.  Antibiotics: The patient continues on antibiotics. We don't have a clear infection. I will repeat her chest x-ray and stop her antibiotics.  Decubitus ulcer: The wound care team is seeing her for this  Foley catheter: This remains in place because of the decubitus ulcer.  Toe infection: Orthosis following her for this.  The patient will need skilled nursing facility placement.  LOS: 8 days     Dwaine Pringle D 12/11/2014, 7:39 AM

## 2014-12-11 NOTE — Progress Notes (Signed)
OT Cancellation Note  Patient Details Name: [name] MRN: [id number] DOB: 11/22/1945   Cancelled Treatment:    Reason Eval/Treat Not Completed: Other (comment) Pt declined due to "dinner time". Will attempt tomorrow.  [place], OTR/L  [phone] 12/11/2014 12/11/2014, 5:08 PM

## 2014-12-11 NOTE — Progress Notes (Signed)
When turning pt, RN assess a skin tear on pt lower back.  A pink foam dressing was applied over the area.  Pt also has a skin tear to left inner thigh.  Barrier cream was applied.  Pt is being turned Q2 with pillows and has pillows in between her legs. Will continue to monitor.   Fredrich Romans, RN

## 2014-12-11 NOTE — Consult Note (Addendum)
[facility] Consultation  [name] WCH:[id number] DOB: 03/31/1946 DOA: 12/03/2014 PCP: Morton Peters, MD   Requesting physician: Newman Pies, MD Date of consultation: 12/11/2014 Reason for consultation: assumption of medical care  Impression / Recommendations:  SDH Status post burr hole 12/06/2014 - neurosurgery to continue to follow - appears to be stable from this condition  Severe deconditioned state May simply be related to her hospitalization however will accomplish metabolic evaluation to assure there is not an underlying complicating medical factor  DM 2 with profound hypoglycemia During admission CBGs have registered as low as 17 - CBGs appear to be stabilizing at present but remains on dextrose IV fluid - we will discontinue the dextrose and stop oral hypoglycemics as I suspect poor oral intake is to blame  Hyponatremia Sodium has not been checked since 12/09/14 but was only mildly low at that time at 133 - recheck be met in a.m.  HTN Poorly controlled at present time - adjust treatment regimen and follow as more strict blood pressure control currently warranted  Chronic kidney disease stage III Records indicate baseline creatinine is approximately 1.95 - currently creatinine is actually slightly better than this baseline - follow trend  Right great toe diabetic wound 2 x 2 centimeter ulcer in the first plantar webspace of the right foot - this has been evaluated by orthopedic surgery who recommends twice a day normal saline wet-to-dry dressing changes and home health follow-up with no need for surgical intervention this time  Chronic Stage 3 sacral decubitus Patient previously had home health assistant for dressing changes - continue care as directed by [place]  Hyperlipidemia Continue Zocor as per home dosing  History of coronary artery disease Followed in the [place] area by Dr. Ubaldo Glassing - patient is asymptomatic at the present time  GI  bleed June 2016 - melena Appears to have undergone EGD in May and colonoscopy in July but presently I have been unable to locate the procedure reports - recheck hemoglobin in a.m.  HPI:  The patient is a 69 year old female with a history of diabetes and hypertension who reported to the hospital on 12/03/2014 for evaluation of fluctuating mental status.  She was ultimately found to be suffering with a R subdural hemorrhage, was transferred from [facility] regional ER to [facility], and underwent neurosurgical drainage via bur holes on 12/06/2014.  I was contacted by her neurosurgeon today who stated that she is entirely neurosurgically stable and that he plans no further intervention at this time.  He states she has been having difficulty with uncontrolled diabetes/hyperglycemia, multiple areas of skin breakdown/a decubitus ulcer  Review of Systems:  The patient is currently without specific complaints other than feeling weak in general.  She specifically denies current headache, visual changes, difficulty swallowing, difficulty speaking, shortness of breath, palpitations, chest pain, abdominal pain, nausea, vomiting, diarrhea, lower extremity pain or swelling.  Past Medical History  Diagnosis Date  . Hypertension   . Diabetes mellitus without complication   . Anemia   . Coronary artery disease   . CHF (congestive heart failure)   . Glaucoma   . Hyperlipidemia   . Chronic kidney disease   . Neuropathy   . Chronic anemia    Past Surgical History  Procedure Laterality Date  . Cardiac catheterization    . Esophagogastroduodenoscopy (egd) with propofol N/A 09/24/2014    Elliott-gastritis & normal Billroth II changes   . Colonoscopy with propofol N/A 09/24/2014    Elliott-Incomplete secondary to prep  .  Coronary angioplasty  2016    stent placed  . Bilroth ii procedure    . Colonoscopy with propofol N/A 11/12/2014    Procedure: COLONOSCOPY WITH PROPOFOL;  Surgeon: Manya Silvas, MD;   Location: [facility] ENDOSCOPY;  Service: Endoscopy;  Laterality: N/A;  Trudee Kuster hole Right 12/06/2014    Procedure: Right burr hole  for subdural hematoma;  Surgeon: Newman Pies, MD;  Location: [facility] NEURO ORS;  Service: Neurosurgery;  Laterality: Right;   Social History:  reports that she has quit smoking. She has never used smokeless tobacco. She reports that she does not drink alcohol or use illicit drugs.  Allergies  Allergen Reactions  . Codeine Other (See Comments)    Reaction:  Hallucinations   Family History  Problem Relation Age of Onset  . Diabetes Mother   . Heart disease Father   . Diabetes Sister   . Lung cancer Brother   . Cancer Brother   . Diabetes Brother   . Diabetes Sister     Prior to Admission medications   Medication Sig Start Date End Date Taking? Authorizing Provider  ciprofloxacin (CIPRO) 500 MG tablet Take 1 tablet (500 mg total) by mouth 2 (two) times daily. 12/01/14   Carrie Mew, MD  clindamycin (CLEOCIN) 150 MG capsule Take 3 capsules (450 mg total) by mouth 3 (three) times daily. 12/01/14   Carrie Mew, MD  docusate sodium (COLACE) 100 MG capsule Take 100 mg by mouth 2 (two) times daily.    Historical Provider, MD  feeding supplement, GLUCERNA SHAKE, (GLUCERNA SHAKE) LIQD Take 237 mLs by mouth 2 (two) times daily between meals. 09/20/14   Loletha Grayer, MD  ferrous sulfate 325 (65 FE) MG tablet Take 325 mg by mouth daily.    Historical Provider, MD  furosemide (LASIX) 40 MG tablet Take 1 tablet (40 mg total) by mouth 2 (two) times daily. 11/02/14   Alisa Graff, FNP  glimepiride (AMARYL) 2 MG tablet Take 1 mg by mouth daily with breakfast.    Historical Provider, MD  hydrALAZINE (APRESOLINE) 100 MG tablet Take 50 mg by mouth 3 (three) times daily.     Historical Provider, MD  isosorbide dinitrate (ISORDIL) 30 MG tablet Take 30 mg by mouth 2 (two) times daily.    Historical Provider, MD  nitroGLYCERIN (NITROSTAT) 0.4 MG SL tablet Place 0.4 mg under the  tongue every 5 (five) minutes as needed for chest pain.    Historical Provider, MD  Omega-3 Fatty Acids (FISH OIL) 1000 MG CAPS Take 1 capsule by mouth daily.    Historical Provider, MD  omeprazole (PRILOSEC) 20 MG capsule Take 20 mg by mouth daily.    Historical Provider, MD  potassium chloride SA (K-DUR,KLOR-CON) 20 MEQ tablet Take 1 tablet (20 mEq total) by mouth daily. 11/02/14   Alisa Graff, FNP  propranolol (INDERAL) 20 MG tablet Take 40 mg by mouth 3 (three) times daily.    Historical Provider, MD  ramipril (ALTACE) 5 MG capsule Take 5 mg by mouth daily.    Historical Provider, MD  simvastatin (ZOCOR) 20 MG tablet Take 20 mg by mouth at bedtime.    Historical Provider, MD  traMADol (ULTRAM) 50 MG tablet Take 25-50 mg by mouth 3 (three) times daily as needed for moderate pain.     Historical Provider, MD   Physical Exam: Blood pressure 187/57, pulse 67, temperature 99.1 F (37.3 C), temperature source Oral, resp. rate 16, height 5' 7"  (1.702 m), weight 66 kg (  145 lb 8.1 oz), SpO2 97 %. Filed Vitals:   12/11/14 1011  BP: 187/57  Pulse: 67  Temp: 99.1 F (37.3 C)  Resp: 16   General: No acute respiratory distress - alert but possibly mildly confused Neck: No JVD or thyromegaly Lungs: Clear to auscultation bilaterally without wheezes or rhonchi, with good air movement throughout all fields Cardiovascular: Regular rate and rhythm without murmur gallop or rub normal S1 and S2 Abdomen: Nontender, nondistended, soft, bowel sounds present, no organomegaly, no rebound, no ascites Extremities: No significant cyanosis, clubbing, edema bilateral lower extremities Neurologic: Alert and oriented x4, seems profoundly weak in general  Labs on Admission:  Basic Metabolic Panel:  Recent Labs Lab 12/05/14 1120 12/08/14 0434 12/09/14 0700  NA 135 132* 133*  K 4.8 5.3* 5.0  CL 106 105 107  CO2 20* 19* 20*  GLUCOSE 174* 120* 96  BUN 23* 34* 35*  CREATININE 1.16* 2.02* 1.69*  CALCIUM 7.8*  7.5* 7.6*   Liver Function Tests: No results for input(s): AST, ALT, ALKPHOS, BILITOT, PROT, ALBUMIN in the last 168 hours. No results for input(s): LIPASE, AMYLASE in the last 168 hours. No results for input(s): AMMONIA in the last 168 hours.   CBC:  Recent Labs Lab 12/05/14 1835 12/07/14 0530 12/08/14 2350  WBC 6.2 6.6 6.0  NEUTROABS  --   --  4.3  HGB 8.3* 6.9* 9.1*  HCT 24.9* 21.3* 27.0*  MCV 80.1 82.9 81.3  PLT 224 295 289   CBG:  Recent Labs Lab 12/10/14 1957 12/10/14 2346 12/11/14 0357 12/11/14 0746 12/11/14 1158  GLUCAP 114* 86 84 85 117*    Radiological Exams on Admission: Ct Head Wo Contrast  12/11/2014   CLINICAL DATA:  Subdural hematoma follow-up.  EXAM: CT HEAD WITHOUT CONTRAST  TECHNIQUE: Contiguous axial images were obtained from the base of the skull through the vertex without intravenous contrast.  COMPARISON:  12/07/2014  FINDINGS: Right frontal and right parietal burr holes are again seen. Skin staples remain in place and there is mild associated scalp soft tissue swelling. Pneumocephalus has decreased. Residual mixed density right frontal subdural hematoma has mildly increased in size, now measuring 1.5 cm in thickness (previously 1.0 cm). Leftward midline shift has slightly increased, measuring 7 mm (previously 5 mm). Right cerebral hemispheric sulcal effacement has increased. The ventricles are normal in size without evidence of hydrocephalus.  There is no evidence of acute large territory infarct, mass, or left-sided extra-axial collection. Orbits are unremarkable. Mastoid air cells are clear. Trace right sphenoid sinus fluid is unchanged.  IMPRESSION: 1. Mildly increased size of residual mixed density right-sided subdural hematoma, now 1.5 cm in thickness. 2. Slightly increased leftward midline shift, now 7 mm.   Electronically Signed   By: Logan Bores   On: 12/11/2014 09:07   Dg Chest Port 1 View  12/11/2014   CLINICAL DATA:  Right subdural hematoma with  evacuation 6 days ago. Fever.  EXAM: PORTABLE CHEST - 1 VIEW  COMPARISON:  12/05/2014 and prior radiographs  FINDINGS: Cardiomegaly, bilateral lower lung atelectasis/ consolidation, pulmonary vascular congestion and small bilateral pleural effusions are again noted. There is no evidence of pneumothorax.  No acute bony abnormalities are identified.  IMPRESSION: Little significant change with bilateral lower lung consolidations/atelectasis, small bilateral pleural effusions, pulmonary vascular congestion and cardiomegaly.   Electronically Signed   By: Margarette Canada M.D.   On: 12/11/2014 12:40    Time spent: 1+ hr  Cherene Altes, MD [facility]  Consults/Admissions - Flow Manager - [phone] Office  [phone]  Contact MD directly via text page:      [email or web address]      password [facility]  12/11/2014, 1:31 PM

## 2014-12-12 DIAGNOSIS — R509 Fever, unspecified: Secondary | ICD-10-CM

## 2014-12-12 LAB — COMPREHENSIVE METABOLIC PANEL
ALT: 9 U/L — AB (ref 14–54)
AST: 15 U/L (ref 15–41)
Albumin: 1.5 g/dL — ABNORMAL LOW (ref 3.5–5.0)
Alkaline Phosphatase: 38 U/L (ref 38–126)
Anion gap: 8 (ref 5–15)
BUN: 31 mg/dL — ABNORMAL HIGH (ref 6–20)
CALCIUM: 7.5 mg/dL — AB (ref 8.9–10.3)
CO2: 22 mmol/L (ref 22–32)
Chloride: 101 mmol/L (ref 101–111)
Creatinine, Ser: 1.42 mg/dL — ABNORMAL HIGH (ref 0.44–1.00)
GFR calc non Af Amer: 37 mL/min — ABNORMAL LOW (ref >60)
GFR, EST AFRICAN AMERICAN: 43 mL/min — AB (ref >60)
Glucose, Bld: 144 mg/dL — ABNORMAL HIGH (ref 65–99)
Potassium: 3.3 mmol/L — ABNORMAL LOW (ref 3.5–5.1)
SODIUM: 131 mmol/L — AB (ref 135–145)
TOTAL PROTEIN: 4.8 g/dL — AB (ref 6.5–8.1)
Total Bilirubin: 0.6 mg/dL (ref 0.3–1.2)

## 2014-12-12 LAB — GLUCOSE, CAPILLARY
GLUCOSE-CAPILLARY: 100 mg/dL — AB (ref 65–99)
GLUCOSE-CAPILLARY: 151 mg/dL — AB (ref 65–99)
GLUCOSE-CAPILLARY: 114 mg/dL — AB (ref 65–99)
Glucose-Capillary: 130 mg/dL — ABNORMAL HIGH (ref 65–99)

## 2014-12-12 LAB — CBC
HCT: 28.2 % — ABNORMAL LOW (ref 36.0–46.0)
Hemoglobin: 9.5 g/dL — ABNORMAL LOW (ref 12.0–15.0)
MCH: 26.6 pg (ref 26.0–34.0)
MCHC: 33.7 g/dL (ref 30.0–36.0)
MCV: 79 fL (ref 78.0–100.0)
Platelets: 345 10*3/uL (ref 150–400)
RBC: 3.57 MIL/uL — AB (ref 3.87–5.11)
RDW: 17.1 % — ABNORMAL HIGH (ref 11.5–15.5)
WBC: 7.1 10*3/uL (ref 4.0–10.5)

## 2014-12-12 LAB — AMMONIA: Ammonia: 32 umol/L (ref 9–35)

## 2014-12-12 LAB — FOLATE: Folate: 12.3 ng/mL (ref >5.9)

## 2014-12-12 LAB — PHOSPHORUS: Phosphorus: 3.5 mg/dL (ref 2.5–4.6)

## 2014-12-12 LAB — MAGNESIUM: Magnesium: 1.9 mg/dL (ref 1.7–2.4)

## 2014-12-12 LAB — TSH: TSH: 2.721 u[IU]/mL (ref 0.350–4.500)

## 2014-12-12 LAB — VITAMIN B12: Vitamin B-12: 889 pg/mL (ref 180–914)

## 2014-12-12 MED ORDER — DEXTROSE 5 % IV SOLN
1.0000 g | INTRAVENOUS | Status: DC
  Filled 2014-12-12: qty 10

## 2014-12-12 MED ORDER — ACETAMINOPHEN 500 MG PO TABS
500.0000 mg | ORAL_TABLET | Freq: Once | ORAL | Status: AC
  Administered 2014-12-12: 500 mg via ORAL
  Filled 2014-12-12: qty 1

## 2014-12-12 NOTE — Consult Note (Signed)
WOC wound consult note Reason for Consult:Asked to re-evaluate wound to right foot as possible source of fever/infection.  Wound has been deferred to ortho and orders in place.  Since not improving, may want to consider diagnostic studies to rule out osteomyelitis at this time.  If ortho in agreement, please order.  Will not follow at this time.  Please re-consult if needed.  Domenic Moras RN BSN [place] Pager [phone]

## 2014-12-12 NOTE — Progress Notes (Signed)
[facility]   AIRYN ELLZEY JQB:[id number] DOB: June 28, 1945 DOA: 12/03/2014 PCP: Morton Peters, MD   HPI:  The patient is a 69 year old female with a history of diabetes and hypertension who reported to the hospital on 12/03/2014 for evaluation of fluctuating mental status.  She was ultimately found to be suffering with a R subdural hemorrhage, was transferred from [place] regional ER to [facility], and underwent neurosurgical drainage via bur holes on 12/06/2014.   neurosurgeon today stated that she is entirely neurosurgically stable and that he plans no further intervention at this time.  He states she has been having difficulty with uncontrolled diabetes/hyperglycemia, multiple areas of skin breakdown/a decubitus ulcer   A/P: Fevers: Incentive spirometry -had been on vanc/zosyn -urine clean -x ray showed atelectasia  SDH Status post burr hole 12/06/2014 - neurosurgery to continue to follow - appears to be stable from this condition  Severe deconditioned state Will need SNF  DM 2 with profound hypoglycemia During admission CBGs have registered as low as 17 - CBGs appear to be stabilizing at present but remains on dextrose IV fluid - we will discontinue the dextrose and stop oral hypoglycemics as I suspect poor oral intake is to blame  Hyponatremia Sodium has not been checked since 12/09/14 131 this AM  HTN Poorly controlled at present time - adjust treatment regimen and follow as more strict blood pressure control currently warranted  Chronic kidney disease stage III Records indicate baseline creatinine is approximately 1.95 - currently creatinine is actually slightly better than this baseline - follow trend If I/Os correct 5.1L negative fluid balance  Right great toe diabetic wound 2 x 2 centimeter ulcer in the first plantar webspace of the right foot - this has been evaluated by orthopedic surgery who recommends twice a day normal saline wet-to-dry dressing  changes and home health follow-up with no need for surgical intervention this time Wound culture on 7/29 grew: kleb/enterobacter: had been on zosyn/vanc 8/3-8/9  Chronic Stage 3 sacral decubitus Patient previously had home health assistant for dressing changes - continue care as directed by WOC  Hyperlipidemia Continue Zocor as per home dosing  History of coronary artery disease Followed in the [place] area by Dr. Ubaldo Glassing - patient is asymptomatic at the present time      Past Medical History  Diagnosis Date  . Hypertension   . Diabetes mellitus without complication   . Anemia   . Coronary artery disease   . CHF (congestive heart failure)   . Glaucoma   . Hyperlipidemia   . Chronic kidney disease   . Neuropathy   . Chronic anemia    Past Surgical History  Procedure Laterality Date  . Cardiac catheterization    . Esophagogastroduodenoscopy (egd) with propofol N/A 09/24/2014    Elliott-gastritis & normal Billroth II changes   . Colonoscopy with propofol N/A 09/24/2014    Elliott-Incomplete secondary to prep  . Coronary angioplasty  2016    stent placed  . Bilroth ii procedure    . Colonoscopy with propofol N/A 11/12/2014    Procedure: COLONOSCOPY WITH PROPOFOL;  Surgeon: Manya Silvas, MD;  Location: [facility] ENDOSCOPY;  Service: Endoscopy;  Laterality: N/A;  Trudee Kuster hole Right 12/06/2014    Procedure: Right burr hole  for subdural hematoma;  Surgeon: Newman Pies, MD;  Location: [facility] NEURO ORS;  Service: Neurosurgery;  Laterality: Right;      Physical Exam: Blood pressure 153/51, pulse 71, temperature 99.3 F (37.4 C), temperature source Oral,  resp. rate 15, height 5\' 7"  (1.702 m), weight 66 kg (145 lb 8.1 oz), SpO2 94 %. Filed Vitals:   12/12/14 0504  BP: 153/51  Pulse: 71  Temp: 99.3 F (37.4 C)  Resp: 15   General: No acute respiratory distress - alert but possibly mildly confused Lungs: Clear to auscultation bilaterally without wheezes or rhonchi, with good air  movement throughout all fields Cardiovascular: Regular rate and rhythm without murmur gallop or rub normal S1 and S2 Abdomen: Nontender, nondistended, soft, bowel sounds present, no organomegaly, no rebound, no ascites Extremities: No significant cyanosis, clubbing, edema bilateral lower extremities   Labs on Admission:  Basic Metabolic Panel:  Recent Labs Lab 12/05/14 1120 12/08/14 0434 12/09/14 0700 12/12/14 0547  NA 135 132* 133* 131*  K 4.8 5.3* 5.0 3.3*  CL 106 105 107 101  CO2 20* 19* 20* 22  GLUCOSE 174* 120* 96 144*  BUN 23* 34* 35* 31*  CREATININE 1.16* 2.02* 1.69* 1.42*  CALCIUM 7.8* 7.5* 7.6* 7.5*  MG  --   --   --  1.9  PHOS  --   --   --  3.5   Liver Function Tests:  Recent Labs Lab 12/12/14 0547  AST 15  ALT 9*  ALKPHOS 38  BILITOT 0.6  PROT 4.8*  ALBUMIN 1.5*   No results for input(s): LIPASE, AMYLASE in the last 168 hours.  Recent Labs Lab 12/12/14 0547  AMMONIA 32     CBC:  Recent Labs Lab 12/05/14 1835 12/07/14 0530 12/08/14 2350 12/12/14 0547  WBC 6.2 6.6 6.0 7.1  NEUTROABS  --   --  4.3  --   HGB 8.3* 6.9* 9.1* 9.5*  HCT 24.9* 21.3* 27.0* 28.2*  MCV 80.1 82.9 81.3 79.0  PLT 224 295 289 345   CBG:  Recent Labs Lab 12/11/14 0746 12/11/14 1158 12/11/14 1712 12/11/14 2135 12/12/14 0623  GLUCAP 85 117* 152* 123* 130*    Radiological Exams on Admission: Ct Head Wo Contrast  12/11/2014   CLINICAL DATA:  Subdural hematoma follow-up.  EXAM: CT HEAD WITHOUT CONTRAST  TECHNIQUE: Contiguous axial images were obtained from the base of the skull through the vertex without intravenous contrast.  COMPARISON:  12/07/2014  FINDINGS: Right frontal and right parietal burr holes are again seen. Skin staples remain in place and there is mild associated scalp soft tissue swelling. Pneumocephalus has decreased. Residual mixed density right frontal subdural hematoma has mildly increased in size, now measuring 1.5 cm in thickness (previously 1.0  cm). Leftward midline shift has slightly increased, measuring 7 mm (previously 5 mm). Right cerebral hemispheric sulcal effacement has increased. The ventricles are normal in size without evidence of hydrocephalus.  There is no evidence of acute large territory infarct, mass, or left-sided extra-axial collection. Orbits are unremarkable. Mastoid air cells are clear. Trace right sphenoid sinus fluid is unchanged.  IMPRESSION: 1. Mildly increased size of residual mixed density right-sided subdural hematoma, now 1.5 cm in thickness. 2. Slightly increased leftward midline shift, now 7 mm.   Electronically Signed   By: Logan Bores   On: 12/11/2014 09:07   Dg Chest Port 1 View  12/11/2014   CLINICAL DATA:  Right subdural hematoma with evacuation 6 days ago. Fever.  EXAM: PORTABLE CHEST - 1 VIEW  COMPARISON:  12/05/2014 and prior radiographs  FINDINGS: Cardiomegaly, bilateral lower lung atelectasis/ consolidation, pulmonary vascular congestion and small bilateral pleural effusions are again noted. There is no evidence of pneumothorax.  No acute bony  abnormalities are identified.  IMPRESSION: Little significant change with bilateral lower lung consolidations/atelectasis, small bilateral pleural effusions, pulmonary vascular congestion and cardiomegaly.   Electronically Signed   By: Margarette Canada M.D.   On: 12/11/2014 12:40    Time spent: 25 min  Eulogio Bear DO [phone]   Contact MD directly via text page:      [email or web address]      password [facility]  12/12/2014, 8:10 AM

## 2014-12-12 NOTE — Progress Notes (Signed)
Occupational Therapy Treatment Patient Details Name: [name] MRN: [id number] DOB: Jul 31, 1945 Today's Date: 12/12/2014    History of present illness 69 year old female was admitted with confusion and left-sided weakness. She was found to have a right subdural hematoma; underwent right bur hole evacuation 8/4.  Also with diabetic foot ulcer R great toe, plantar surface of foot.   OT comments  Pt progressing towards acute OT goals. Focus of session was sitting EOB in preparation for ADLs and on attending to left side. Session details below. D/c plan remains appropriate.   Follow Up Recommendations  SNF    Equipment Recommendations  Other (comment) (defer to next venue)    Recommendations for Other Services      Precautions / Restrictions Precautions Precautions: Fall Precaution Comments: L sided neglect Restrictions Weight Bearing Restrictions: No       Mobility Bed Mobility Overal bed mobility: Needs Assistance;+2 for physical assistance Bed Mobility: Rolling;Sidelying to Sit;Sit to Sidelying Rolling: Max assist Sidelying to sit: Mod assist;+2 for physical assistance     Sit to sidelying: Mod assist;+2 for physical assistance General bed mobility comments: Max A to roll to left. +2 Mod A to complete sidelying to EOB with cues to push through with BUE  Transfers                      Balance Overall balance assessment: Needs assistance Sitting-balance support: Feet supported;Bilateral upper extremity supported Sitting balance-Leahy Scale: Zero Sitting balance - Comments: Pt with heavy posterior lean this session. Mod-Max +2 assist to sit EOB 1.5 minutes. Cues for trunk flexion. Positioned BUE in supported position at pt's side.  Postural control: Posterior lean                         ADL Overall ADL's : Needs assistance/impaired Eating/Feeding: Minimal assistance;Bed level;Cueing for compensatory techinques Eating/Feeding Details (indicate  cue type and reason): min assist to hold drink. Educated on using right hand to trace tray to the end on the left side to locate items on left side (cup of lemon, etc.)                                   General ADL Comments: Pt completed rolling to left side with tactile and verbal cues to grab left bed rail with right hand. Max A to roll to left. +2 Mod A to complete sidelying to EOB with cues to push through with BUE. Pt sat EOB for 1.5 minutes with mod +2 A. Pt with heavy posterior lean with cues provided to encourage trunk flexion. While sitting up in bed, educated pt on visual scanning techniques to locate items on meal tray on left side. Initially pt did not track past midline, staying in right visual field. Pt then cued to trace edge of tray with right hand all the way to the end of the left side of the tray while scanning for item. Pt able to locate item on left side this way and gave good immediate return demo on scanning technique to loacte a second item in left visual field on tray.      Vision                     Perception     Praxis      Cognition   Behavior During Therapy: [facility] for tasks assessed/performed  Overall Cognitive Status: No family/caregiver present to determine baseline cognitive functioning Area of Impairment: Attention;Following commands;Problem solving;Safety/judgement;Awareness;Memory   Current Attention Level: Focused Memory: Decreased short-term memory  Following Commands: Follows one step commands inconsistently;Follows one step commands with increased time Safety/Judgement: Decreased awareness of safety;Decreased awareness of deficits Awareness: Intellectual Problem Solving: Slow processing;Requires verbal cues;Requires tactile cues;Decreased initiation General Comments: Pt needing multimodal cues and extra time particularly to initiate tasks. Cues for tracking to the left to locate items on left side of tray.    Extremity/Trunk  Assessment               Exercises     Shoulder Instructions       General Comments      Pertinent Vitals/ Pain       Pain Assessment: Faces Faces Pain Scale: Hurts little more Pain Location: posterior neck Pain Descriptors / Indicators: Sore Pain Intervention(s): Repositioned;Monitored during session  Home Living                                          Prior Functioning/Environment              Frequency Min 2X/week     Progress Toward Goals  OT Goals(current goals can now be found in the care plan section)  Progress towards OT goals: Progressing toward goals  Acute Rehab OT Goals Patient Stated Goal: none stated OT Goal Formulation: With patient/family Time For Goal Achievement: 12/22/14 Potential to Achieve Goals: Good ADL Goals Pt Will Perform Grooming: with min assist;sitting Additional ADL Goal #1: pt will sit unsupported EOB x 3 minutes with min A in preparation for ADLs Additional ADL Goal #2: Pt will attend to LUE during adls with mod multimodal cues Additional ADL Goal #3: pt will assist with rolling at max A level in preparation for adls  Plan Discharge plan remains appropriate    Co-evaluation                 End of Session     Activity Tolerance Patient limited by fatigue;Patient tolerated treatment well   Patient Left in bed;with call bell/phone within reach;with bed alarm set;with SCD's reapplied   Nurse Communication Mobility status        Time: 8828-0034 OT Time Calculation (min): 21 min  Charges: OT General Charges $OT Visit: 1 Procedure OT Treatments $Self Care/Home Management : 8-22 mins  Hortencia Pilar 12/12/2014, 12:42 PM

## 2014-12-12 NOTE — Clinical Social Work Note (Signed)
CSW left a voice message for [name] to confirmed his acceptance of [facility]. [name] informed the CSW that he accepted the bed offer, but he reported that he had some questions that he would like to talk to [place] at [facility] about. CSW did start insurance authorization with [facility].   [place], MSW, [place]

## 2014-12-12 NOTE — Progress Notes (Signed)
Patient ID: [name], female   DOB: February 16, 1946, 69 y.o.   MRN: [id number] Subjective:  The patient is alert and pleasant. She has no complaints. She is in no apparent distress.  Objective: Vital signs in last 24 hours: Temp:  [99.3 F (37.4 C)-101.1 F (38.4 C)] 99.3 F (37.4 C) (08/10 1011) Pulse Rate:  [71-89] 71 (08/10 1011) Resp:  [15-18] 18 (08/10 1011) BP: (149-190)/(49-75) 169/60 mmHg (08/10 1011) SpO2:  [94 %-99 %] 96 % (08/10 1011)  Intake/Output from previous day: 08/09 0701 - 08/10 0700 In: -  Out: 1750 [Urine:1750] Intake/Output this shift:    Physical exam the patient is alert and oriented 3, Glasgow Coma Scale 15. Her speech is normal. She is moving all 4 extremities well. Her burr hole incisions are healing well.  Lab Results:  Recent Labs  12/12/14 0547  WBC 7.1  HGB 9.5*  HCT 28.2*  PLT 345   BMET  Recent Labs  12/12/14 0547  NA 131*  K 3.3*  CL 101  CO2 22  GLUCOSE 144*  BUN 31*  CREATININE 1.42*  CALCIUM 7.5*    Studies/Results: Ct Head Wo Contrast  12/11/2014   CLINICAL DATA:  Subdural hematoma follow-up.  EXAM: CT HEAD WITHOUT CONTRAST  TECHNIQUE: Contiguous axial images were obtained from the base of the skull through the vertex without intravenous contrast.  COMPARISON:  12/07/2014  FINDINGS: Right frontal and right parietal burr holes are again seen. Skin staples remain in place and there is mild associated scalp soft tissue swelling. Pneumocephalus has decreased. Residual mixed density right frontal subdural hematoma has mildly increased in size, now measuring 1.5 cm in thickness (previously 1.0 cm). Leftward midline shift has slightly increased, measuring 7 mm (previously 5 mm). Right cerebral hemispheric sulcal effacement has increased. The ventricles are normal in size without evidence of hydrocephalus.  There is no evidence of acute large territory infarct, mass, or left-sided extra-axial collection. Orbits are unremarkable.  Mastoid air cells are clear. Trace right sphenoid sinus fluid is unchanged.  IMPRESSION: 1. Mildly increased size of residual mixed density right-sided subdural hematoma, now 1.5 cm in thickness. 2. Slightly increased leftward midline shift, now 7 mm.   Electronically Signed   By: Logan Bores   On: 12/11/2014 09:07   Dg Chest Port 1 View  12/11/2014   CLINICAL DATA:  Right subdural hematoma with evacuation 6 days ago. Fever.  EXAM: PORTABLE CHEST - 1 VIEW  COMPARISON:  12/05/2014 and prior radiographs  FINDINGS: Cardiomegaly, bilateral lower lung atelectasis/ consolidation, pulmonary vascular congestion and small bilateral pleural effusions are again noted. There is no evidence of pneumothorax.  No acute bony abnormalities are identified.  IMPRESSION: Little significant change with bilateral lower lung consolidations/atelectasis, small bilateral pleural effusions, pulmonary vascular congestion and cardiomegaly.   Electronically Signed   By: Margarette Canada M.D.   On: 12/11/2014 12:40    Assessment/Plan: Postop day #7: The patient is doing well clinically and radiographically from a neurosurgical point of view. We will remove her staples. I appreciate the hospitalist, Dr. Eliseo Squires, taking over the care of this patient.  LOS: 9 days     Mily Malecki D 12/12/2014, 4:56 PM

## 2014-12-12 NOTE — Progress Notes (Signed)
Physical Therapy Treatment Patient Details Name: [name] MRN: [id number] DOB: December 07, 1945 Today's Date: 12/12/2014    History of Present Illness 69 year old female was admitted with confusion and left-sided weakness. She was found to have a right subdural hematoma; underwent right bur hole evacuation 8/4.  Also with diabetic foot ulcer R great toe, plantar surface of foot.    PT Comments    Patient progressing slowly towards PT goals. Pt able to attend to left side today. Continues to require assist of 2 for static sitting balance due to heavy posterior lean. Able to sit upright with manual cues for facilitation of trunk musculature and when distracted however fatigues quickly. Will continue to follow acutely to maximize independence and mobility.   Follow Up Recommendations  SNF;Supervision/Assistance - 24 hour     Equipment Recommendations  None recommended by PT    Recommendations for Other Services       Precautions / Restrictions Precautions Precautions: Fall Precaution Comments: L sided neglect Restrictions Weight Bearing Restrictions: No    Mobility  Bed Mobility Overal bed mobility: Needs Assistance;+2 for physical assistance Bed Mobility: Supine to Sit     Supine to sit: Max assist;HOB elevated     General bed mobility comments: Assist to bring BLEs to EOB, scoot bottom and elevate trunk. Increased time to initiate movements and cues for sequencing.  Transfers                    Ambulation/Gait                 Stairs            Wheelchair Mobility    Modified Rankin (Stroke Patients Only) Modified Rankin (Stroke Patients Only) Pre-Morbid Rankin Score: No significant disability Modified Rankin: Severe disability     Balance Overall balance assessment: Needs assistance Sitting-balance support: Feet supported;No upper extremity supported Sitting balance-Leahy Scale: Zero Sitting balance - Comments: Pt with heavy posterior  lean with Max A of 1 to sit EOB ~8 minutes. Cues to initiate core musculature to facilitate forward flexion. When pt distracted taking medications, less posterior lean noted. Max manual cues to faciltate thoracic and lumbar extension. Not able to initiate or maintain positioning. Postural control: Posterior lean                          Cognition Arousal/Alertness: Awake/alert Behavior During Therapy: WFL for tasks assessed/performed Overall Cognitive Status: Impaired/Different from baseline Area of Impairment: Orientation;Attention;Safety/judgement;Awareness Orientation Level: Disoriented to;Time Current Attention Level: Focused   Following Commands: Follows one step commands inconsistently;Follows one step commands with increased time Safety/Judgement: Decreased awareness of safety;Decreased awareness of deficits Awareness: Intellectual Problem Solving: Slow processing;Requires verbal cues;Requires tactile cues;Decreased initiation General Comments: Requires multimodal cues to initiate tasks/movements. ABle to track left this session.    Exercises      General Comments General comments (skin integrity, edema, etc.): Pt able to attend to the left side today with cues.       Pertinent Vitals/Pain Pain Assessment: Faces Faces Pain Scale: Hurts little more Pain Location: headache Pain Descriptors / Indicators: Headache Pain Intervention(s): Monitored during session;Repositioned;RN gave pain meds during session    Home Living                      Prior Function            PT Goals (current goals can now be found in the  care plan section) Progress towards PT goals: Progressing toward goals    Frequency  Min 3X/week    PT Plan Current plan remains appropriate    Co-evaluation             End of Session   Activity Tolerance: Patient tolerated treatment well Patient left: in bed;with call bell/phone within reach;with bed alarm set;with  nursing/sitter in room     Time: 2585-2778 PT Time Calculation (min) (ACUTE ONLY): 20 min  Charges:  $Neuromuscular Re-education: 8-22 mins                    G Codes:      Siearra Amberg A Sharisa Toves 12/12/2014, 4:46 PM Wray Kearns, [place], DPT [phone]

## 2014-12-12 NOTE — Progress Notes (Signed)
Speech Language Pathology Treatment: Cognitive-Linquistic  Patient Details Name: [name] MRN: [id number] DOB: [birth year] Today's Date: 12/12/2014 Time: 6546-5035 SLP Time Calculation (min) (ACUTE ONLY): 26 min  Assessment / Plan / Recommendation Clinical Impression  Pt seen for skilled cognitive linguistic treatment.  Pt awake in bed awaiting her husband to come to help her to eat.  Pt was oriented to self, situation but required total cues to attempt to read clock.  Large print orientation information provided on paper by SLP with pt ability to read accurately.   Pt demonstrated decreased awareness to left sided weakness, stating right side was weaker.  After planned failure- lifting both arms/legs, pt able to state left side weaker and was able to retain information within 5 minutes.  Basic problem solving skills reviewed including locating call bell to reach RN - as pt initially states she would call her son to beckon nurse.  Use of moderate tactile, visual, verbal cues to locate call bell on left side of bed effective - with pt recalling at end of session accurately.  Pt admits to memory deficits currently and states her goal is to improve memory.  SLP to modify goals based on today's session to maximize functional rehab in current environment- pt agreeable.  Recommend follow up at SNF for cognitive linguistic rehabiliation.     HPI Other Pertinent Information: 69 year old female was admitted with confusion and left-sided weakness. She was found to have a right subdural hematoma; underwent right bur hole evacuation 8/4.     Pertinent Vitals Pain Assessment: No/denies pain  SLP Plan  Continue with current plan of care    Recommendations                Follow up Recommendations: Skilled Nursing facility Plan: Continue with current plan of care    [place], [facility] SLP [phone]

## 2014-12-13 DIAGNOSIS — L8990 Pressure ulcer of unspecified site, unspecified stage: Secondary | ICD-10-CM

## 2014-12-13 DIAGNOSIS — I6200 Nontraumatic subdural hemorrhage, unspecified: Principal | ICD-10-CM

## 2014-12-13 LAB — BASIC METABOLIC PANEL
ANION GAP: 12 (ref 5–15)
BUN: 33 mg/dL — AB (ref 6–20)
CO2: 19 mmol/L — AB (ref 22–32)
CREATININE: 1.33 mg/dL — AB (ref 0.44–1.00)
Calcium: 7.6 mg/dL — ABNORMAL LOW (ref 8.9–10.3)
Chloride: 101 mmol/L (ref 101–111)
GFR calc non Af Amer: 40 mL/min — ABNORMAL LOW (ref >60)
GFR, EST AFRICAN AMERICAN: 46 mL/min — AB (ref >60)
Glucose, Bld: 115 mg/dL — ABNORMAL HIGH (ref 65–99)
Potassium: 3.4 mmol/L — ABNORMAL LOW (ref 3.5–5.1)
Sodium: 132 mmol/L — ABNORMAL LOW (ref 135–145)

## 2014-12-13 LAB — CBC
HEMATOCRIT: 28.6 % — AB (ref 36.0–46.0)
HEMOGLOBIN: 9.8 g/dL — AB (ref 12.0–15.0)
MCH: 27.1 pg (ref 26.0–34.0)
MCHC: 34.3 g/dL (ref 30.0–36.0)
MCV: 79 fL (ref 78.0–100.0)
Platelets: 324 10*3/uL (ref 150–400)
RBC: 3.62 MIL/uL — ABNORMAL LOW (ref 3.87–5.11)
RDW: 17.3 % — ABNORMAL HIGH (ref 11.5–15.5)
WBC: 6 10*3/uL (ref 4.0–10.5)

## 2014-12-13 LAB — GLUCOSE, CAPILLARY
GLUCOSE-CAPILLARY: 116 mg/dL — AB (ref 65–99)
GLUCOSE-CAPILLARY: 165 mg/dL — AB (ref 65–99)
GLUCOSE-CAPILLARY: 118 mg/dL — AB (ref 65–99)

## 2014-12-13 MED ORDER — INSULIN ASPART 100 UNIT/ML ~~LOC~~ SOLN: 0.0000 [IU] | Freq: Three times a day (TID) | SUBCUTANEOUS | Status: DC

## 2014-12-13 MED ORDER — GLUCERNA SHAKE PO LIQD: 237.0000 mL | Freq: Three times a day (TID) | ORAL | Status: DC

## 2014-12-13 MED ORDER — ACETAMINOPHEN 325 MG PO TABS: 650.0000 mg | ORAL_TABLET | ORAL | Status: AC | PRN

## 2014-12-13 MED ORDER — TRAMADOL HCL 50 MG PO TABS: 25.0000 mg | ORAL_TABLET | Freq: Three times a day (TID) | ORAL | Status: DC | PRN

## 2014-12-13 NOTE — Progress Notes (Signed)
Removed staples from right side of head; anterior and posteriorly.  Skin was clean, dry, and intact.  Patient tolerated procedure very well.

## 2014-12-13 NOTE — Discharge Summary (Signed)
Physician Discharge Summary  [name] XMI:[id number] DOB: 08/24/1945 DOA: 12/03/2014  PCP: Morton Peters, MD  Admit date: 12/03/2014 Discharge date: 12/13/2014  Time spent: greater than 30 minutes  Recommendations for Outpatient Follow-up:  1. To SNF  2. Monitor blood glucose qac and hs  follow-up with Dr. Newman Pies in 2 weeks. 3. Wet to dry dressing changes right foot wound daily 4. Marquis cell over sacral wound cover with tape, change daily 5. Decubitus precautions  Discharge Diagnoses:  Principal Problem:   SDH (subdural hematoma) Active Problems:   Pressure ulcer, sacrum, present on admission Type 2 diabetes with hypoglycemia Diabetic foot wound, right, present on admission Healthcare associated pneumonia, treated Deconditioning Hyponatremia Essential hypertension Stage III chronic kidney disease Hyperlipidemia History of coronary artery disease  Discharge Condition: Stable  Diet recommendation: Diabetic heart healthy  Filed Weights   12/03/14 2000  Weight: 66 kg (145 lb 8.1 oz)    History of present illness/Hospital Course:  69 year old female with a history of diabetes and hypertension who reported to the hospital on 12/03/2014 for evaluation of fluctuating mental status. She was ultimately found to be suffering with a R subdural hemorrhage, was transferred from [facility] to [facility], and underwent neurosurgical drainage via bur holes on 12/06/2014.  neurosurgeon stated that she is entirely neurosurgically stable and that he plans no further intervention at this time. He states she has been having difficulty with uncontrolled diabetes/hyperglycemia, multiple areas of skin breakdown/a decubitus ulcer. [facility] were consulted and assumed care.  Fevers, none further. -had been on vanc/zosyn for abnormal chest x-ray and possible healthcare associated pneumonia. Patient denies cough has clear lung sounds. Has been afebrile  for greater than 36 hours. No further antibiotics needed. -urine clean -x ray showed atelectasis  SDH Status post burr hole 12/06/2014 - neurosurgery to follow-up in 2 weeks. Staples have been removed  Severe deconditioned state Will need SNF. Cleared by neurosurgery for discharge today.  DM 2 with profound hypoglycemia During admission CBGs have registered as low as 17 -  had been on Amaryl prior to admission. This was stopped and she's had no further hypoglycemia. Recommend sliding scale insulin and CBGs  Hyponatremia 131 this AM  HTN Difficult to control. Regimen adjusted.  Chronic kidney disease stage III Records indicate baseline creatinine is approximately 1.95 - currently creatinine is actually slightly better than this baseline   Right great toe diabetic wound 2 x 2 centimeter ulcer in the first plantar webspace of the right foot - this has been evaluated by orthopedic surgery who recommends twice a day normal saline wet-to-dry dressing changes and home health follow-up with no need for surgical intervention this time Wound culture on 7/29 grew: kleb/enterobacter: had been on zosyn/vanc 8/3-8/9.  X-ray without osteomyelitis. Patient may follow-up with Dr. Maureen Ralphs in 2 weeks.  Chronic Stage 3 sacral decubitus Patient previously had home health assistant for dressing changes - continue care as directed by [facility]   Hyperlipidemia Continue Zocor as per home dosing  History of coronary artery disease Followed in the [place] area by Dr. Ubaldo Glassing - patient is asymptomatic at the present time  Procedures:  Status post right bur holes for evacuation of subdural hematoma by Dr. Earle Gell on 8/14  Consultations:  Neurology  Neurosurgery  Orthopedic surgery.  Discharge Exam: Filed Vitals:   12/13/14 1337  BP: 141/64  Pulse: 72  Temp: 98.2 F (36.8 C)  Resp: 16    General: Alert, oriented. Comfortable. HEENT:  Incision without drainage Cardiovascular: Regular rate  rhythm without murmurs gallops rubs Respiratory: Clear to auscultation bilaterally without wheezes rhonchi or rales Neurologic: Cranial nerves and sensorimotor exam are grossly intact  Discharge Instructions   Discharge Instructions    Diet - low sodium heart healthy    Complete by:  As directed      Diet Carb Modified    Complete by:  As directed      Discharge wound care:    Complete by:  As directed   Wet to dry dressing on foot wound     Walk with assistance    Complete by:  As directed           Current Discharge Medication List    START taking these medications   Details  acetaminophen (TYLENOL) 325 MG tablet Take 2 tablets (650 mg total) by mouth every 4 (four) hours as needed for mild pain (or temp > 99 F).    !! feeding supplement, GLUCERNA SHAKE, (GLUCERNA SHAKE) LIQD Take 237 mLs by mouth 3 (three) times daily between meals. Refills: 0    insulin aspart (NOVOLOG) 100 UNIT/ML injection Inject 0-9 Units into the skin 3 (three) times daily with meals. Qty: 10 mL, Refills: 11     !! - Potential duplicate medications found. Please discuss with provider.    CONTINUE these medications which have CHANGED   Details  traMADol (ULTRAM) 50 MG tablet Take 0.5-1 tablets (25-50 mg total) by mouth 3 (three) times daily as needed for moderate pain. Qty: 10 tablet, Refills: 0      CONTINUE these medications which have NOT CHANGED   Details  docusate sodium (COLACE) 100 MG capsule Take 100 mg by mouth 2 (two) times daily.    !! feeding supplement, GLUCERNA SHAKE, (GLUCERNA SHAKE) LIQD Take 237 mLs by mouth 2 (two) times daily between meals. Qty: 60 Can, Refills: 0    ferrous sulfate 325 (65 FE) MG tablet Take 325 mg by mouth daily.    furosemide (LASIX) 40 MG tablet Take 1 tablet (40 mg total) by mouth 2 (two) times daily. Qty: 60 tablet, Refills: 5    hydrALAZINE (APRESOLINE) 100 MG tablet Take 50 mg by mouth 3 (three) times daily.     isosorbide dinitrate (ISORDIL) 30  MG tablet Take 30 mg by mouth 2 (two) times daily.    nitroGLYCERIN (NITROSTAT) 0.4 MG SL tablet Place 0.4 mg under the tongue every 5 (five) minutes as needed for chest pain.    Omega-3 Fatty Acids (FISH OIL) 1000 MG CAPS Take 1 capsule by mouth daily.    omeprazole (PRILOSEC) 20 MG capsule Take 20 mg by mouth daily.    potassium chloride SA (K-DUR,KLOR-CON) 20 MEQ tablet Take 1 tablet (20 mEq total) by mouth daily. Qty: 30 tablet, Refills: 5    propranolol (INDERAL) 20 MG tablet Take 40 mg by mouth 3 (three) times daily.    ramipril (ALTACE) 5 MG capsule Take 5 mg by mouth daily.    simvastatin (ZOCOR) 20 MG tablet Take 20 mg by mouth at bedtime.     !! - Potential duplicate medications found. Please discuss with provider.    STOP taking these medications     ciprofloxacin (CIPRO) 500 MG tablet      clindamycin (CLEOCIN) 150 MG capsule      glimepiride (AMARYL) 2 MG tablet        Allergies  Allergen Reactions  . Codeine Other (See Comments)    Reaction:  Hallucinations   Follow-up Information    Follow up with Ophelia Charter, MD. Schedule an appointment as soon as possible for a visit in 2 weeks.   Specialty:  Neurosurgery   Contact information:   [address] [place] [address] [phone]        The results of significant diagnostics from this hospitalization (including imaging, microbiology, ancillary and laboratory) are listed below for reference.    Significant Diagnostic Studies: Ct Head Wo Contrast  12/11/2014   CLINICAL DATA:  Subdural hematoma follow-up.  EXAM: CT HEAD WITHOUT CONTRAST  TECHNIQUE: Contiguous axial images were obtained from the base of the skull through the vertex without intravenous contrast.  COMPARISON:  12/07/2014  FINDINGS: Right frontal and right parietal burr holes are again seen. Skin staples remain in place and there is mild associated scalp soft tissue swelling. Pneumocephalus has decreased. Residual mixed  density right frontal subdural hematoma has mildly increased in size, now measuring 1.5 cm in thickness (previously 1.0 cm). Leftward midline shift has slightly increased, measuring 7 mm (previously 5 mm). Right cerebral hemispheric sulcal effacement has increased. The ventricles are normal in size without evidence of hydrocephalus.  There is no evidence of acute large territory infarct, mass, or left-sided extra-axial collection. Orbits are unremarkable. Mastoid air cells are clear. Trace right sphenoid sinus fluid is unchanged.  IMPRESSION: 1. Mildly increased size of residual mixed density right-sided subdural hematoma, now 1.5 cm in thickness. 2. Slightly increased leftward midline shift, now 7 mm.   Electronically Signed   By: Logan Bores   On: 12/11/2014 09:07   Ct Head Wo Contrast  12/07/2014   CLINICAL DATA:  Followup subdural hematoma.  EXAM: CT HEAD WITHOUT CONTRAST  TECHNIQUE: Contiguous axial images were obtained from the base of the skull through the vertex without intravenous contrast.  COMPARISON:  MRI of the brain December 04, 2014  FINDINGS: Interval placement of 2 RIGHT frontal burr holes, with Surgicel for evacuation of subdural hematoma, residual 10 mm RIGHT frontal mixed density extra-axial fluid collection with extra-axial pneumocephalus. Residual 5 mm RIGHT to LEFT midline shift, improved. Partially re-expanded RIGHT lateral ventricle. No hydrocephalus/ventricular entrapment. Basal cisterns are patent. No acute large vascular territory infarct.  Suspected old small suboccipital craniectomy. No acute skull fracture. Small RIGHT sphenoid sinus air-fluid level. The mastoid air cells are well aerated. Included ocular globes and orbital contents are normal.  IMPRESSION: Interval partial evacuation of RIGHT subdural hematoma via two new frontal burr holes, with 9 mm residual component, new extra-axial pneumocephalus.  5 mm residual RIGHT to LEFT midline shift, improved with partially re-expanded  RIGHT lateral ventricle, no hydrocephalus/ventricular entrapment.   Electronically Signed   By: Elon Alas M.D.   On: 12/07/2014 06:36   Ct Head Wo Contrast  12/03/2014   CLINICAL DATA:  69 year old hypertensive diabetic female with increased confusion. Recent infection of right foot with fever. Initial encounter.  EXAM: CT HEAD WITHOUT CONTRAST  TECHNIQUE: Contiguous axial images were obtained from the base of the skull through the vertex without intravenous contrast.  COMPARISON:  06/25/2014.  FINDINGS: Since the prior CT, interval development of large broad-based complex right-sided subdural collection with maximal thickness frontal region of 1.6 cm causing mass effect upon the right lateral ventricle which is displaced posteriorly and towards the left with 1 cm of midline shift.  The right-sided complex subdural collection has an appearance most suggestive of acute on chronic subdural hematoma (although new from 06/25/2014).  Patient has  a history fever. An empyema is a less likely consideration for the above described findings. If this were of high clinical concern MR would prove helpful for further delineation.  No CT evidence of large acute infarct.  Vascular calcifications  Mastoid air cells, middle ear cavities and visualized paranasal sinuses are clear.  No calvarial fracture noted.  IMPRESSION: Large broad-based right-sided subdural hematoma has developed since the prior CT. Maximal thickness 1.6 cm with midline shift to left by 1 cm.  These results were called by telephone at the time of interpretation on 12/03/2014 at 4:47 pm to Dr. Lisa Roca , who verbally acknowledged these results.   Electronically Signed   By: Genia Del M.D.   On: 12/03/2014 16:55   Mr Brain Wo Contrast  12/04/2014   CLINICAL DATA:  Three-week history of intermittent altered mental status, confusion and weakness. Followup subdural hematoma. History of diabetes, hypertension, neuropathy.  EXAM: MRI HEAD WITHOUT  CONTRAST  TECHNIQUE: Multiplanar, multiecho pulse sequences of the brain and surrounding structures were obtained without intravenous contrast.  COMPARISON:  CT head December 03, 2014  FINDINGS: 18 mm RIGHT holo hemispheric T2 bright, intermediate to bright T1 subdural hematoma results in 7 mm RIGHT to LEFT midline shift, partially effaced RIGHT lateral ventricle without hydrocephalus or ventricular entrapment. A few scattered subcentimeter supratentorial white matter T2 hyperintensities compatible chronic small vessel ischemic disease, less than expected for age. No susceptibility artifact to suggest intraparenchymal blood products.  Normal major intracranial vascular flow voids noted at the skull base. Status post RIGHT ocular lens implant. Paranasal sinuses and mastoid air cells are well aerated. No abnormal sellar expansion. No cerebellar tonsillar ectopia. No suspicious calvarial bone marrow signal.  IMPRESSION: 18 mm RIGHT holohemispheric early subacute subdural hematoma resulting in 7 mm RIGHT to LEFT midline shift, stable from yesterday's CT.  No acute ischemia.   Electronically Signed   By: Elon Alas M.D.   On: 12/04/2014 01:56   US Venous Img Lower Unilateral Right  12/01/2014   CLINICAL DATA:  Right lower extremity swelling  EXAM: Right LOWER EXTREMITY VENOUS DOPPLER ULTRASOUND  TECHNIQUE: Gray-scale sonography with graded compression, as well as color Doppler and duplex ultrasound were performed to evaluate the lower extremity deep venous systems from the level of the common femoral vein and including the common femoral, femoral, profunda femoral, popliteal and calf veins including the posterior tibial, peroneal and gastrocnemius veins when visible. The superficial great saphenous vein was also interrogated. Spectral Doppler was utilized to evaluate flow at rest and with distal augmentation maneuvers in the common femoral, femoral and popliteal veins.  COMPARISON:  None.  FINDINGS: Contralateral  Common Femoral Vein: Respiratory phasicity is normal and symmetric with the symptomatic side. No evidence of thrombus. Normal compressibility.  Common Femoral Vein: No evidence of thrombus. Normal compressibility, respiratory phasicity and response to augmentation.  Saphenofemoral Junction: No evidence of thrombus. Normal compressibility and flow on color Doppler imaging.  Profunda Femoral Vein: No evidence of thrombus. Normal compressibility and flow on color Doppler imaging.  Femoral Vein: No evidence of thrombus. Normal compressibility, respiratory phasicity and response to augmentation.  Popliteal Vein: No evidence of thrombus. Normal compressibility, respiratory phasicity and response to augmentation.  Calf Veins: No evidence of thrombus. Normal compressibility and flow on color Doppler imaging.  Superficial Great Saphenous Vein: No evidence of thrombus. Normal compressibility and flow on color Doppler imaging.  Venous Reflux:  None.  Other Findings: There is a right popliteal fossa fluid collection measuring  0.8 x 2.7 x 3.7 cm.  IMPRESSION: 1. No evidence of deep venous thrombosis. 2. Right popliteal cyst.   Electronically Signed   By: Andreas Newport M.D.   On: 12/01/2014 01:29   Dg Chest Port 1 View  12/11/2014   CLINICAL DATA:  Right subdural hematoma with evacuation 6 days ago. Fever.  EXAM: PORTABLE CHEST - 1 VIEW  COMPARISON:  12/05/2014 and prior radiographs  FINDINGS: Cardiomegaly, bilateral lower lung atelectasis/ consolidation, pulmonary vascular congestion and small bilateral pleural effusions are again noted. There is no evidence of pneumothorax.  No acute bony abnormalities are identified.  IMPRESSION: Little significant change with bilateral lower lung consolidations/atelectasis, small bilateral pleural effusions, pulmonary vascular congestion and cardiomegaly.   Electronically Signed   By: Margarette Canada M.D.   On: 12/11/2014 12:40   Dg Chest Port 1 View  12/05/2014   CLINICAL DATA:  Fever.  Hypertension and diabetes. Congestive heart failure. Chronic kidney disease.  EXAM: PORTABLE CHEST - 1 VIEW  COMPARISON:  10/08/2014  FINDINGS: Increased size of small bilateral pleural effusions and bibasilar infiltrates or atelectasis seen since prior study. Cardiomegaly is stable. Multiple old right rib fracture deformities again noted.  IMPRESSION: Increased small bilateral pleural effusions, and bibasilar atelectasis versus infiltrates.  Stable cardiomegaly.   Electronically Signed   By: Earle Gell M.D.   On: 12/05/2014 20:03   Dg Foot Complete Right  11/30/2014   CLINICAL DATA:  Ulceration of the great toe.  EXAM: RIGHT FOOT COMPLETE - 3+ VIEW  COMPARISON:  None.  FINDINGS: No evidence of cortical erosion of first digit to suggest osteomyelitis.  IMPRESSION: No evidence of osteomyelitis.   Electronically Signed   By: Suzy Bouchard M.D.   On: 11/30/2014 21:22    Microbiology: Recent Results (from the past 240 hour(s))  MRSA PCR Screening     Status: None   Collection Time: 12/03/14  7:46 PM  Result Value Ref Range Status   MRSA by PCR NEGATIVE NEGATIVE Final    Comment:        The GeneXpert MRSA Assay (FDA approved for NASAL specimens only), is one component of a comprehensive MRSA colonization surveillance program. It is not intended to diagnose MRSA infection nor to guide or monitor treatment for MRSA infections.   Culture, Urine     Status: None   Collection Time: 12/08/14 10:16 PM  Result Value Ref Range Status   Specimen Description URINE, RANDOM  Final   Special Requests zosyn Normal  Final   Culture NO GROWTH 1 DAY  Final   Report Status 12/10/2014 FINAL  Final  Culture, blood (routine x 2)     Status: None (Preliminary result)   Collection Time: 12/08/14 11:42 PM  Result Value Ref Range Status   Specimen Description BLOOD LEFT ANTECUBITAL  Final   Special Requests AEROCOCCUS SPECIES 3CC  Final   Culture NO GROWTH 4 DAYS  Final   Report Status PENDING  Incomplete   Culture, blood (routine x 2)     Status: None (Preliminary result)   Collection Time: 12/08/14 11:50 PM  Result Value Ref Range Status   Specimen Description BLOOD LEFT HAND  Final   Special Requests BOTTLES DRAWN AEROBIC ONLY 3CC  Final   Culture NO GROWTH 4 DAYS  Final   Report Status PENDING  Incomplete     Labs: Basic Metabolic Panel:  Recent Labs Lab 12/08/14 0434 12/09/14 0700 12/12/14 0547 12/13/14 0525  NA 132* 133* 131* 132*  K  5.3* 5.0 3.3* 3.4*  CL 105 107 101 101  CO2 19* 20* 22 19*  GLUCOSE 120* 96 144* 115*  BUN 34* 35* 31* 33*  CREATININE 2.02* 1.69* 1.42* 1.33*  CALCIUM 7.5* 7.6* 7.5* 7.6*  MG  --   --  1.9  --   PHOS  --   --  3.5  --    Liver Function Tests:  Recent Labs Lab 12/12/14 0547  AST 15  ALT 9*  ALKPHOS 38  BILITOT 0.6  PROT 4.8*  ALBUMIN 1.5*   No results for input(s): LIPASE, AMYLASE in the last 168 hours.  Recent Labs Lab 12/12/14 0547  AMMONIA 32   CBC:  Recent Labs Lab 12/07/14 0530 12/08/14 2350 12/12/14 0547 12/13/14 0525  WBC 6.6 6.0 7.1 6.0  NEUTROABS  --  4.3  --   --   HGB 6.9* 9.1* 9.5* 9.8*  HCT 21.3* 27.0* 28.2* 28.6*  MCV 82.9 81.3 79.0 79.0  PLT 295 289 345 324   Cardiac Enzymes: No results for input(s): CKTOTAL, CKMB, CKMBINDEX, TROPONINI in the last 168 hours. BNP: BNP (last 3 results)  Recent Labs  09/17/14 1410 10/02/14 1900 11/30/14 2203  BNP 1525.0* 816.0* 2031.0*    ProBNP (last 3 results) No results for input(s): PROBNP in the last 8760 hours.  CBG:  Recent Labs Lab 12/12/14 1112 12/12/14 1617 12/12/14 2201 12/13/14 0633 12/13/14 1130  GLUCAP 100* 151* 114* 116* 165*       Signed:  Maui Ahart L  [facility] 12/13/2014, 3:30 PM

## 2014-12-13 NOTE — Clinical Social Work Placement (Signed)
   CLINICAL SOCIAL WORK PLACEMENT  NOTE  Date:  12/13/2014  Patient Details  Name: [name] MRN: [id number] Date of Birth: 01/22/1946  Clinical Social Work is seeking post-discharge placement for this patient at the [place] level of care (*CSW will initial, date and re-position this form in  chart as items are completed):  Yes   Patient/family provided with [place] Work Department's list of facilities offering this level of care within the geographic area requested by the patient (or if unable, by the patient's family).  Yes   Patient/family informed of their freedom to choose among providers that offer the needed level of care, that participate in Medicare, Medicaid or managed care program needed by the patient, have an available bed and are willing to accept the patient.  Yes   Patient/family informed of [facility]'s ownership interest in [facility] and [facility], as well as of the fact that they are under no obligation to receive care at these facilities.  PASRR submitted to EDS on 12/10/14     PASRR number received on 12/10/14     Existing PASRR number confirmed on       FL2 transmitted to all facilities in geographic area requested by pt/family on 12/10/14     FL2 transmitted to all facilities within larger geographic area on       Patient informed that his/her managed care company has contracts with or will negotiate with certain facilities, including the following:        Yes   Patient/family informed of bed offers received.  Patient chooses bed at  [facility] )     Physician recommends and patient chooses bed at      Patient to be transferred to  [facility] ) on 12/13/14.  Patient to be transferred to facility by  [facility] )     Patient family notified on 12/13/14 of transfer.  Name of family member notified:   (Pt's son, [name] )     PHYSICIAN Please prepare prescriptions,  Please prepare priority discharge summary, including medications     Additional Comment:    _______________________________________________ Glendon Axe, MSW, [place] [phone] 12/13/2014 3:08 PM

## 2014-12-13 NOTE — Clinical Social Work Note (Signed)
Clinical Social Worker facilitated patient discharge including contacting patient family and facility to confirm patient discharge plans.  Clinical information faxed to facility and family agreeable with plan.  CSW arranged ambulance transport via [facility] to [facility].  RN to call report prior to discharge.  DC packet prepared and on chart for transport with number for report.  Clinical Social Worker will sign off for now as social work intervention is no longer needed. Please consult [place] again if new need arises.  Glendon Axe, MSW, LCSWA [phone] 12/13/2014 4:51 PM

## 2014-12-13 NOTE — Care Management Important Message (Signed)
Important Message  Patient Details  Name: [name] MRN: [id number] Date of Birth: 1945-12-06   Medicare Important Message Given:  Yes-third notification given    Delorse Lek 12/13/2014, 2:53 PM

## 2014-12-13 NOTE — Progress Notes (Signed)
Patient is being d/c to a nursing facility, tried multiple times to call report to the receiving nurse to no avail. Charge nurse notified.

## 2014-12-14 LAB — CULTURE, BLOOD (ROUTINE X 2)
Culture: NO GROWTH
Culture: NO GROWTH

## 2015-01-11 ENCOUNTER — Other Ambulatory Visit: Payer: Self-pay | Admitting: Surgery

## 2015-01-11 ENCOUNTER — Ambulatory Visit
Admission: RE | Admit: 2015-01-11 | Discharge: 2015-01-11 | Disposition: A | Discharge status: Home / Self Care | Payer: Medicare HMO | Source: Ambulatory Visit | Attending: Surgery | Admitting: Surgery

## 2015-01-11 ENCOUNTER — Encounter: Payer: Medicare HMO | Attending: Surgery | Admitting: Surgery

## 2015-01-11 DIAGNOSIS — Z87891 Personal history of nicotine dependence: Secondary | ICD-10-CM | POA: Insufficient documentation

## 2015-01-11 DIAGNOSIS — I129 Hypertensive chronic kidney disease with stage 1 through stage 4 chronic kidney disease, or unspecified chronic kidney disease: Secondary | ICD-10-CM | POA: Insufficient documentation

## 2015-01-11 DIAGNOSIS — Z794 Long term (current) use of insulin: Secondary | ICD-10-CM | POA: Insufficient documentation

## 2015-01-11 DIAGNOSIS — I2510 Atherosclerotic heart disease of native coronary artery without angina pectoris: Secondary | ICD-10-CM | POA: Insufficient documentation

## 2015-01-11 DIAGNOSIS — L97512 Non-pressure chronic ulcer of other part of right foot with fat layer exposed: Secondary | ICD-10-CM | POA: Insufficient documentation

## 2015-01-11 DIAGNOSIS — E11621 Type 2 diabetes mellitus with foot ulcer: Secondary | ICD-10-CM | POA: Insufficient documentation

## 2015-01-11 DIAGNOSIS — E119 Type 2 diabetes mellitus without complications: Secondary | ICD-10-CM | POA: Insufficient documentation

## 2015-01-11 DIAGNOSIS — S91109A Unspecified open wound of unspecified toe(s) without damage to nail, initial encounter: Secondary | ICD-10-CM

## 2015-01-11 DIAGNOSIS — N183 Chronic kidney disease, stage 3 (moderate): Secondary | ICD-10-CM | POA: Diagnosis not present

## 2015-01-11 DIAGNOSIS — M1990 Unspecified osteoarthritis, unspecified site: Secondary | ICD-10-CM | POA: Insufficient documentation

## 2015-01-11 DIAGNOSIS — X58XXXA Exposure to other specified factors, initial encounter: Secondary | ICD-10-CM | POA: Diagnosis not present

## 2015-01-12 NOTE — Progress Notes (Signed)
[name], [name] ([id number]) Visit Report for 01/11/2015 Abuse/Suicide Risk Screen Details Patient Name: [name], [name]. Date of Service: 01/11/2015 1:00 PM Medical Record Number: [id number] Patient Account Number: [id number] Date of Birth/Sex: [birth year] (69 y.o. Female) Treating RN: Cornell Barman Primary Care Physician: Reed Breech Other Clinician: Referring Physician: Treating Physician/Extender: Frann Rider in Treatment: 0 Abuse/Suicide Risk Screen Items Answer ABUSE/SUICIDE RISK SCREEN: Has anyone close to you tried to hurt or harm you recentlyo No Do you feel uncomfortable with anyone in your familyo No Has anyone forced you do things that you didnot want to doo No Do you have any thoughts of harming yourselfo No Patient displays signs or symptoms of abuse and/or neglect. No Electronic Signature(s) Signed: 01/11/2015 5:06:19 PM By: Gretta Cool, RN, BSN, Kim RN, BSN Entered By: Gretta Cool, RN, BSN, Kim on 01/11/2015 13:28:00 [name] ([id number]) -------------------------------------------------------------------------------- Activities of Daily Living Details Patient Name: [name], [name]. Date of Service: 01/11/2015 1:00 PM Medical Record Number: [id number] Patient Account Number: [id number] Date of Birth/Sex: 1945-08-10 (69 y.o. Female) Treating RN: Cornell Barman Primary Care Physician: Reed Breech Other Clinician: Referring Physician: Treating Physician/Extender: Frann Rider in Treatment: 0 Activities of Daily Living Items Answer Activities of Daily Living (Please select one for each item) Drive Automobile Not Able Take Medications Need Assistance Use Telephone Need Assistance Care for Appearance Need Assistance Use Toilet Need Assistance Bath / Shower Need Assistance Dress Self Need Assistance Feed Self Need Assistance Walk Need Assistance Get In / Out Bed Need Assistance Housework Need Assistance Prepare Meals Need Assistance Handle  Money Need Assistance Shop for Self Need Assistance Electronic Signature(s) Signed: 01/11/2015 5:06:19 PM By: Gretta Cool, RN, BSN, Kim RN, BSN Entered By: Gretta Cool, RN, BSN, Kim on 01/11/2015 13:28:25 [name] ([id number]) -------------------------------------------------------------------------------- Education Assessment Details Patient Name: [name] Date of Service: 01/11/2015 1:00 PM Medical Record Number: [id number] Patient Account Number: [id number] Date of Birth/Sex: 15-Apr-1946 (69 y.o. Female) Treating RN: Cornell Barman Primary Care Physician: Reed Breech Other Clinician: Referring Physician: Treating Physician/Extender: Frann Rider in Treatment: 0 Learning Preferences/Education Level/Primary Language Learning Preference: Explanation Highest Education Level: High School Preferred Language: English Cognitive Barrier Assessment/Beliefs Language Barrier: No Translator Needed: No Memory Deficit: No Emotional Barrier: No Physical Barrier Assessment Impaired Vision: No Impaired Hearing: No Knowledge/Comprehension Assessment Knowledge Level: High Comprehension Level: High Ability to understand written High instructions: Ability to understand verbal High instructions: Motivation Assessment Anxiety Level: Calm Cooperation: Cooperative Education Importance: Acknowledges Need Interest in Health Problems: Asks Questions Perception: Coherent Willingness to Engage in Self- High Management Activities: Readiness to Engage in Self- High Management Activities: Electronic Signature(s) Signed: 01/11/2015 5:06:19 PM By: Gretta Cool, RN, BSN, Kim RN, BSN Entered By: Gretta Cool, RN, BSN, Kim on 01/11/2015 13:28:46 [name], [name] ([id number], [name] ([id number]) -------------------------------------------------------------------------------- Fall Risk Assessment Details Patient Name: [name] Date of Service: 01/11/2015 1:00 PM Medical Record  Number: [id number] Patient Account Number: [id number] Date of Birth/Sex: 1946/01/03 (69 y.o. Female) Treating RN: Cornell Barman Primary Care Physician: Reed Breech Other Clinician: Referring Physician: Treating Physician/Extender: Frann Rider in Treatment: 0 Fall Risk Assessment Items FALL RISK ASSESSMENT: History of falling - immediate or within 3 months 0 No Secondary diagnosis 0 No Ambulatory aid None/bed rest/wheelchair/nurse 0 No Crutches/cane/walker 15 Yes Furniture 0 No IV Access/Saline Lock 0 No Gait/Training Normal/bed rest/immobile 0 Yes Weak 0 No Impaired 0 No Mental Status Oriented to own ability 0 Yes Electronic Signature(s) Signed: 01/11/2015 5:06:19 PM  By: Gretta Cool, RN, BSN, Kim RN, BSN Entered By: Gretta Cool, RN, BSN, Kim on 01/11/2015 13:29:10 [name] ([id number]) -------------------------------------------------------------------------------- Foot Assessment Details Patient Name: [name], [name]. Date of Service: 01/11/2015 1:00 PM Medical Record Number: [id number] Patient Account Number: [id number] Date of Birth/Sex: 02-Oct-1945 (69 y.o. Female) Treating RN: Cornell Barman Primary Care Physician: Reed Breech Other Clinician: Referring Physician: Treating Physician/Extender: Frann Rider in Treatment: 0 Foot Assessment Items Site Locations + = Sensation present, - = Sensation absent, C = Callus, U = Ulcer R = Redness, W = Warmth, M = Maceration, PU = Pre-ulcerative lesion F = Fissure, S = Swelling, D = Dryness Assessment Right: Left: Other Deformity: No No Prior Foot Ulcer: No No Prior Amputation: No No Charcot Joint: No No Ambulatory Status: Ambulatory With Help Assistance Device: Walker GaitEnergy manager) Signed: 01/11/2015 5:06:19 PM By: Gretta Cool, RN, BSN, Kim RN, BSN Entered By: Gretta Cool, RN, BSN, Kim on 01/11/2015 13:29:58 [name]  ([id number]) -------------------------------------------------------------------------------- Nutrition Risk Assessment Details Patient Name: [name] Date of Service: 01/11/2015 1:00 PM Medical Record Number: [id number] Patient Account Number: [id number] Date of Birth/Sex: 03-Jun-1945 (69 y.o. Female) Treating RN: Cornell Barman Primary Care Physician: Reed Breech Other Clinician: Referring Physician: Treating Physician/Extender: Frann Rider in Treatment: 0 Height (in): Weight (lbs): 145 Body Mass Index (BMI): Nutrition Risk Assessment Items NUTRITION RISK SCREEN: I have an illness or condition that made me change the kind and/or 0 No amount of food I eat I eat fewer than two meals per day 0 No I eat few fruits and vegetables, or milk products 0 No I have three or more drinks of beer, liquor or wine almost every day 0 No I have tooth or mouth problems that make it hard for me to eat 0 No I don't always have enough money to buy the food I need 0 No I eat alone most of the time 0 No I take three or more different prescribed or over-the-counter drugs a 0 No day Without wanting to, I have lost or gained 10 pounds in the last six 0 No months I am not always physically able to shop, cook and/or feed myself 0 No Nutrition Protocols Good Risk Protocol Provide education on Moderate Risk Protocol 0 nutrition Electronic Signature(s) Signed: 01/11/2015 5:06:19 PM By: Gretta Cool, RN, BSN, Kim RN, BSN Entered By: Gretta Cool, RN, BSN, Kim on 01/11/2015 13:29:21

## 2015-01-12 NOTE — Progress Notes (Signed)
[name], [name] ([id number]) Visit Report for 01/11/2015 Chief Complaint Document Details Patient Name: [name], [name]. Date of Service: 01/11/2015 1:00 PM Medical Record Number: [id number] Patient Account Number: [id number] Date of Birth/Sex: [birth year] (69 y.o. Female) Treating RN: Cornell Barman Primary Care Physician: Reed Breech Other Clinician: Referring Physician: Treating Physician/Extender: Frann Rider in Treatment: 0 Information Obtained from: Patient Chief Complaint Patients presents for treatment of an open diabetic ulcer. the patient has had a nonhealing wound of the right plantar aspect of foot near the great toe for about 2 months. Electronic Signature(s) Signed: 01/11/2015 2:31:14 PM By: Christin Fudge MD, FACS Entered By: Christin Fudge on 01/11/2015 14:31:14 [name] ([id number]) -------------------------------------------------------------------------------- Debridement Details Patient Name: [name] Date of Service: 01/11/2015 1:00 PM Medical Record Number: [id number] Patient Account Number: [id number] Date of Birth/Sex: 1945/06/14 (69 y.o. Female) Treating RN: Cornell Barman Primary Care Physician: Reed Breech Other Clinician: Referring Physician: Treating Physician/Extender: Frann Rider in Treatment: 0 Debridement Performed for Wound #1 Right Metatarsal head first Assessment: Performed By: Physician Pat Patrick., MD Debridement: Debridement Pre-procedure Yes Verification/Time Out Taken: Start Time: 13:40 Pain Control: Other : lidocaine 4% Level: Skin/Subcutaneous Tissue Total Area Debrided (L x 2 (cm) x 1 (cm) = 2 (cm) W): Tissue and other Viable, Non-Viable, Fat, Fibrin/Slough, Skin, Subcutaneous material debrided: Instrument: Forceps, Scissors Bleeding: Minimum Hemostasis Achieved: Pressure End Time: 13:50 Procedural Pain: 0 Post Procedural Pain: 0 Response to Treatment: Procedure was tolerated  well Post Debridement Measurements of Total Wound Length: (cm) 2 Width: (cm) 1 Depth: (cm) 0.5 Volume: (cm) 0.785 Post Procedure Diagnosis Same as Pre-procedure Electronic Signature(s) Signed: 01/11/2015 2:30:38 PM By: Christin Fudge MD, FACS Signed: 01/11/2015 5:06:19 PM By: Gretta Cool RN, BSN, Kim RN, BSN Entered By: Christin Fudge on 01/11/2015 14:30:38 [name] ([id number]) -------------------------------------------------------------------------------- HPI Details Patient Name: [name]. Date of Service: 01/11/2015 1:00 PM Medical Record Number: [id number] Patient Account Number: [id number] Date of Birth/Sex: 09-Nov-1945 (69 y.o. Female) Treating RN: Cornell Barman Primary Care Physician: Reed Breech Other Clinician: Referring Physician: Treating Physician/Extender: Frann Rider in Treatment: 0 History of Present Illness Location: also on the right foot near the big toe Quality: Patient reports No Pain. Severity: Patient states wound are getting worse. Duration: Patient has had the wound for > 3 months prior to seeking treatment at the wound center Context: The wound appeared gradually over time Modifying Factors: Consults to this date include: was admitted to hospital and treated as an inpatient. Associated Signs and Symptoms: Patient reports having difficulty standing for long periods. HPI Description: 69 year old patient who recently had a subdural hematoma and not to have surgery in early August 2016. She is known to have diabetes hypertension and had altered mental status which required the surgery. During the admission she was noted to have pressure ulcers of the sacrum, also on the right foot and had stage III chronic kidney disease with a history of chronic data disease. over a period of time her sacral and back ulceration have all healed and the only reason she is here is to consult [place] regarding her right foot. she is not on any anti-buttocks at  the present time. Electronic Signature(s) Signed: 01/11/2015 2:35:15 PM By: Christin Fudge MD, FACS Entered By: Christin Fudge on 01/11/2015 14:35:14 [name] ([id number]) -------------------------------------------------------------------------------- Physical Exam Details Patient Name: [name] Date of Service: 01/11/2015 1:00 PM Medical Record Number: [id number] Patient Account Number: [id number] Date of Birth/Sex: Nov 28, 1945 (68  y.o. Female) Treating RN: Cornell Barman Primary Care Physician: Reed Breech Other Clinician: Referring Physician: Treating Physician/Extender: Frann Rider in Treatment: 0 Constitutional . Pulse regular. Respirations normal and unlabored. Afebrile. . Eyes Nonicteric. Reactive to light. Ears, Nose, Mouth, and Throat Lips, teeth, and gums WNL.Marland Kitchen Moist mucosa without lesions . Neck supple and nontender. No palpable supraclavicular or cervical adenopathy. Normal sized without goiter. Respiratory WNL. No retractions.. Cardiovascular Pedal Pulses WNL. ABI on the right was 0.93 and the left was 0.87. No clubbing, cyanosis or edema. Gastrointestinal (GI) Abdomen without masses or tenderness.. No liver or spleen enlargement or tenderness.. Lymphatic No adneopathy. No adenopathy. No adenopathy. Musculoskeletal Adexa without tenderness or enlargement.. Digits and nails w/o clubbing, cyanosis, infection, petechiae, ischemia, or inflammatory conditions.. Integumentary (Hair, Skin) No suspicious lesions. No crepitus or fluctuance. No peri-wound warmth or erythema. No masses.Marland Kitchen Psychiatric Judgement and insight Intact.. No evidence of depression, anxiety, or agitation.. Notes The patient had a necrotic wound on the right plantar aspect of her foot which is in between the first and second toe. There is a lot of undermining and this goes both superiorly to the dorsum and medially. Electronic Signature(s) Signed: 01/11/2015 2:41:14 PM By:  Christin Fudge MD, FACS Entered By: Christin Fudge on 01/11/2015 14:41:14 [name] ([id number]) -------------------------------------------------------------------------------- Physician Orders Details Patient Name: [name] Date of Service: 01/11/2015 1:00 PM Medical Record Number: [id number] Patient Account Number: [id number] Date of Birth/Sex: November 23, 1945 (69 y.o. Female) Treating RN: Cornell Barman Primary Care Physician: Reed Breech Other Clinician: Referring Physician: Treating Physician/Extender: Frann Rider in Treatment: 0 Verbal / Phone Orders: Yes Clinician: Cornell Barman Read Back and Verified: Yes Diagnosis Coding Wound Cleansing Wound #1 Right Metatarsal head first o Clean wound with Normal Saline. Anesthetic Wound #1 Right Metatarsal head first o Topical Lidocaine 4% cream applied to wound bed prior to debridement Primary Wound Dressing Wound #1 Right Metatarsal head first o Aquacel Ag - Calcium Alginate AG Rope packed into tunnel at 11 and 3. Secondary Dressing Wound #1 Right Metatarsal head first o Gauze and Kerlix/Conform Dressing Change Frequency Wound #1 Right Metatarsal head first o Change dressing every day. Follow-up Appointments Wound #1 Right Metatarsal head first o Return Appointment in 1 week. Edema Control Wound #1 Right Metatarsal head first o Elevate legs to the level of the heart and pump ankles as often as possible Radiology o X-ray, toes - Order sent with patient [name], [name] ([id number]) oooo Electronic Signature(s) Signed: 01/11/2015 4:37:26 PM By: Christin Fudge MD, FACS Signed: 01/11/2015 5:06:19 PM By: Gretta Cool RN, BSN, Kim RN, BSN Entered By: Gretta Cool, RN, BSN, Kim on 01/11/2015 13:54:43 [name] ([id number]) -------------------------------------------------------------------------------- Problem List Details Patient Name: JEZELLE, GULLICK. Date of Service: 01/11/2015 1:00 PM Medical Record  Number: [id number] Patient Account Number: [id number] Date of Birth/Sex: [birth year] (69 y.o. 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 Date of Service: 01/11/2015 1:00 PM  Medical Record Number: [id number] Patient Account Number: [id number] Date of Birth/Sex: Nov 03, 1945 (69 y.o. Female) Treating RN: Cornell Barman Primary Care Physician: Reed Breech Other Clinician: Referring Physician: Treating Physician/Extender: Frann Rider in Treatment: 0 Subjective Chief Complaint Information obtained from Patient Patients presents for treatment of an open diabetic ulcer. the patient has had a nonhealing wound of the right plantar aspect of foot near the great toe for about 2 months. History of Present Illness (HPI) The following HPI elements were documented for the patient's wound: Location: also on the right foot near the big toe Quality: Patient reports No Pain. Severity: Patient states wound are getting worse. Duration: Patient has had the wound for > 3 months prior to seeking treatment at the wound center Context: The wound appeared gradually over time Modifying Factors: Consults to this date include: was  admitted to hospital and treated as an inpatient. Associated Signs and Symptoms: Patient reports having difficulty standing for long periods. 69 year old patient who recently had a subdural hematoma and not to have surgery in early August 2016. She is known to have diabetes hypertension and had altered mental status which required the surgery. During the admission she was noted to have pressure ulcers of the sacrum, also on the right foot and had stage III chronic kidney disease with a history of chronic data disease. over a period of time her sacral and back ulceration have all healed and the only reason she is here is to consult [place] regarding her right foot. she is not on any anti-buttocks at the present time. Wound History Patient presents with 1 open wound that has been present for approximately 72mos. Laboratory tests have been performed in the last month. Patient reportedly has tested positive for an antibiotic resistant organism. Patient reportedly has not tested positive for osteomyelitis. Patient reportedly has not had testing performed to evaluate circulation in the legs. Patient History Information obtained from Patient. Allergies No Known Drug Allergies Family History [name], [name] ([id number]) Cancer - Mother, Father, Diabetes - Siblings, Heart Disease - Father, Hypertension - Siblings, Thyroid Problems - Siblings, No family history of Kidney Disease, Lung Disease, Seizures, Stroke. Social History Former smoker, Marital Status - Married, Alcohol Use - Daily, Drug Use - No History, Caffeine Use - Daily. Medical History Eyes Patient has history of Cataracts Hematologic/Lymphatic Patient has history of Anemia - takes iron Denies history of Hemophilia, Human Immunodeficiency Virus, Lymphedema, Sickle Cell Disease Respiratory Denies history of Aspiration, Asthma, Chronic Obstructive Pulmonary Disease (COPD), Pneumothorax, Sleep Apnea, Tuberculosis Cardiovascular Patient  has history of Hypertension Denies history of Angina, Arrhythmia, Congestive Heart Failure, Coronary Artery Disease, Deep Vein Thrombosis, Hypotension, Myocardial Infarction, Peripheral Arterial Disease, Peripheral Venous Disease, Phlebitis, Vasculitis Gastrointestinal Denies history of Cirrhosis , Colitis, Crohn s, Hepatitis A, Hepatitis B, Hepatitis C Endocrine Patient has history of Type II Diabetes Denies history of Type I Diabetes Genitourinary Denies history of End Stage Renal Disease Immunological Denies history of Lupus Erythematosus, Raynaud s, Scleroderma Integumentary (Skin) Denies history of History of Burn, History of pressure wounds Musculoskeletal Patient has history of Osteoarthritis Denies history of Gout, Rheumatoid Arthritis, Osteomyelitis Neurologic Denies history of Dementia, Neuropathy, Quadriplegia, Paraplegia, Seizure Disorder Oncologic Denies history of Received Chemotherapy, Received Radiation Psychiatric Denies history of Anorexia/bulimia, Confinement Anxiety Patient is treated with Insulin, Oral Agents. Blood sugar is tested. Hospitalization/Surgery History - 12/03/2014, [facility], subdural hematoma. Medical And Surgical History Notes Constitutional Symptoms (General Health) Type II Diabetes; HTN; Fluid pills Review of Systems (ROS) [name],  [name] ([id number]) Constitutional Symptoms (General Health) The patient has no complaints or symptoms. Eyes The patient has no complaints or symptoms. Ear/Nose/Mouth/Throat The patient has no complaints or symptoms. Hematologic/Lymphatic The patient has no complaints or symptoms. Respiratory The patient has no complaints or symptoms. Cardiovascular Complains or has symptoms of LE edema. Denies complaints or symptoms of Chest pain. Gastrointestinal The patient has no complaints or symptoms. Endocrine The patient has no complaints or symptoms. Genitourinary The patient has no complaints or  symptoms. Immunological The patient has no complaints or symptoms. Integumentary (Skin) Complains or has symptoms of Wounds. Denies complaints or symptoms of Bleeding or bruising tendency, Breakdown. Musculoskeletal Denies complaints or symptoms of Muscle Pain, Muscle Weakness. Neurologic Denies complaints or symptoms of Numbness/parasthesias, Focal/Weakness. Oncologic The patient has no complaints or symptoms. Psychiatric The patient has no complaints or symptoms. Medications acetaminophen 325 mg tablet oral 2 2 tablet oral tramadol 50 mg tablet oral tablet oral glimepiride 2 mg tablet oral tablet oral simvastatin 20 mg tablet oral 1 1 tablet oral ramipril 5 mg capsule oral 1 1 capsule oral hydralazine 100 mg tablet oral tablet oral propranolol 20 mg tablet oral 2 2 tablet oral ferrous sulfate 325 mg (65 mg iron) tablet oral 1 1 tablet oral docusate sodium 100 mg capsule oral 1 1 capsule oral clindamycin 150 mg capsule oral capsule oral omega-3 fatty acids 1,000 mg capsule oral 1 1 capsule oral furosemide 40 mg tablet oral 1 1 tablet oral Glucerna oral liquid oral liquid oral [name], Krystal C. ([id number]) potassium chloride ER 10 mEq tablet,extended release oral 1 1 tablet extended release oral omeprazole 20 mg capsule,delayed release oral 1 1 capsule,delayed release(DR/EC) oral ciprofloxacin 100 mg tablet oral tablet oral isosorbide dinitrate 30 mg tablet oral 1 1 tablet oral nitroglycerin 0.4 mg sublingual tablet sublingual 1 1 tablet, sublingual sublingual Objective Constitutional Pulse regular. Respirations normal and unlabored. Afebrile. Vitals Time Taken: 1:04 PM, Weight: 145 lbs, Temperature: 98.8 F, Pulse: 74 bpm, Respiratory Rate: 18 breaths/min, Blood Pressure: 123/43 mmHg. Eyes Nonicteric. Reactive to light. Ears, Nose, Mouth, and Throat Lips, teeth, and gums WNL.Marland Kitchen Moist mucosa without lesions . Neck supple and nontender. No palpable supraclavicular or  cervical adenopathy. Normal sized without goiter. Respiratory WNL. No retractions.. Cardiovascular Pedal Pulses WNL. ABI on the right was 0.93 and the left was 0.87. No clubbing, cyanosis or edema. Gastrointestinal (GI) Abdomen without masses or tenderness.. No liver or spleen enlargement or tenderness.. Lymphatic No adneopathy. No adenopathy. No adenopathy. Musculoskeletal Adexa without tenderness or enlargement.. Digits and nails w/o clubbing, cyanosis, infection, petechiae, ischemia, or inflammatory conditions.Marland Kitchen Psychiatric Judgement and insight Intact.. No evidence of depression, anxiety, or agitation.. General Notes: The patient had a necrotic wound on the right plantar aspect of her foot which is in between the first and second toe. There is a lot of undermining and this goes both superiorly to the dorsum and [name], Krystal C. ([id number]) medially. Integumentary (Hair, Skin) No suspicious lesions. No crepitus or fluctuance. No peri-wound warmth or erythema. No masses.. Wound #1 status is Open. Original cause of wound was Gradually Appeared. The wound is located on the Right Metatarsal head first. The wound measures 2cm length x 1cm width x 0.4cm depth; 1.571cm^2 area and 0.628cm^3 volume. The wound is limited to skin breakdown. There is tunneling at 11:00 with a maximum distance of 1cm. There is additional tunneling and at 3:00 with a maximum distance of 1.2cm. There is a small amount of serous drainage noted. The wound  margin is indistinct and nonvisible. There is small (1-33%) granulation within the wound bed. There is a large (67-100%) amount of necrotic tissue within the wound bed including Adherent Slough. The periwound skin appearance exhibited: Moist. The periwound skin appearance did not exhibit: Callus, Crepitus, Excoriation, Fluctuance, Friable, Induration, Localized Edema, Rash, Scarring, Dry/Scaly, Maceration, Atrophie Blanche, Cyanosis, Ecchymosis, Hemosiderin  Staining, Mottled, Pallor, Rubor, Erythema. Assessment Active Problems ICD-10 E11.621 - Type 2 diabetes mellitus with foot ulcer L97.512 - Non-pressure chronic ulcer of other part of right foot with fat layer exposed This elderly patient who recently had a cerebral event is now recovering and walking around with a walker. She will need packing of the wound with silver alginate rope and appropriate offloading. 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 There was an x-ray done on 29th of July which did not show any bone involvement but at this stage I'm concerned and will repeat the x-ray. The patient is also encouraged to take the load off this foot as much as possible and see as an regular basis and she is to be compliant. Procedures Wound #1 Wound #1 is a Diabetic Wound/Ulcer of the Lower Extremity located on the Right Metatarsal head first . There was a Skin/Subcutaneous Tissue Debridement ([id number]) debridement with total area of 2 sq cm performed by Eithan Beagle, Jackson Latino., MD. with the following instrument(s): Forceps and Scissors to remove Viable and Non-Viable tissue/material including Fat, Fibrin/Slough, Skin, and Subcutaneous after achieving pain control using Other (lidocaine 4%). A time out was conducted prior to the start of the procedure. A Minimum [name], [name] ([id number]) amount of bleeding was controlled with Pressure. The procedure was tolerated well with a pain level of 0 throughout and a pain level of 0 following the procedure. Post Debridement Measurements: 2cm length x 1cm width x 0.5cm depth; 0.785cm^3 volume. Post procedure Diagnosis Wound #1: Same as Pre-Procedure Plan Wound Cleansing: Wound #1 Right Metatarsal head first: Clean wound with Normal Saline. Anesthetic: Wound #1 Right Metatarsal head first: Topical Lidocaine 4% cream applied to wound bed prior to  debridement Primary Wound Dressing: Wound #1 Right Metatarsal head first: Aquacel Ag - Calcium Alginate AG Rope packed into tunnel at 11 and 3. Secondary Dressing: Wound #1 Right Metatarsal head first: Gauze and Kerlix/Conform Dressing Change Frequency: Wound #1 Right Metatarsal head first: Change dressing every day. Follow-up Appointments: Wound #1 Right Metatarsal head first: Return Appointment in 1 week. Edema Control: Wound #1 Right Metatarsal head first: Elevate legs to the level of the heart and pump ankles as often as possible Radiology ordered were: X-ray, toes - Order sent with patient This elderly patient who recently had a cerebral event is now recovering and walking around with a walker. She will need packing of the wound with silver alginate rope and appropriate offloading. 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 There was an x-ray done on 29th of July which did not show any bone involvement but at this stage I'm concerned and will repeat the x-ray. The patient is also encouraged to take the load off this foot as much as possible and see as an regular basis and she is to be compliant. [name], GUERRIERI ([id number]) Electronic Signature(s) Signed: 01/11/2015 2:44:07 PM By: Christin Fudge MD, FACS Entered  By: Christin Fudge on 01/11/2015 14:44:07 [name] ([id number]) -------------------------------------------------------------------------------- ROS/PFSH Details Patient Name: [name] Date of Service: 01/11/2015 1:00 PM Medical Record Number: [id number] Patient Account Number: [id number] Date of Birth/Sex: Mar 27, 1946 (69 y.o. Female) Treating RN: Cornell Barman Primary Care Physician: Reed Breech Other Clinician: Referring Physician: Treating Physician/Extender: Frann Rider in Treatment: 0 Information Obtained From Patient Wound  History Do you currently have one or more open woundso Yes How many open wounds do you currently haveo 1 Approximately how long have you had your woundso 23mos Has your wound(s) ever healed and then re-openedo No Have you had any lab work done in the past montho Yes Have you tested positive for osteomyelitis (bone infection)o No Have you had any tests for circulation on your legso No Cardiovascular Complaints and Symptoms: Positive for: LE edema Negative for: Chest pain Medical History: Positive for: Hypertension Negative for: Angina; Arrhythmia; Congestive Heart Failure; Coronary Artery Disease; Deep Vein Thrombosis; Hypotension; Myocardial Infarction; Peripheral Arterial Disease; Peripheral Venous Disease; Phlebitis; Vasculitis Integumentary (Skin) Complaints and Symptoms: Positive for: Wounds Negative for: Bleeding or bruising tendency; Breakdown Medical History: Negative for: History of Burn; History of pressure wounds Musculoskeletal Complaints and Symptoms: Negative for: Muscle Pain; Muscle Weakness Medical History: Positive for: Osteoarthritis Negative for: Gout; Rheumatoid Arthritis; Osteomyelitis Neurologic [name], Krystal C. ([id number]) Complaints and Symptoms: Negative for: Numbness/parasthesias; Focal/Weakness Medical History: Negative for: Dementia; Neuropathy; Quadriplegia; Paraplegia; Seizure Disorder Constitutional Symptoms (General Health) Complaints and Symptoms: No Complaints or Symptoms Medical History: Past Medical History Notes: Type II Diabetes; HTN; Fluid pills Eyes Complaints and Symptoms: No Complaints or Symptoms Medical History: Positive for: Cataracts Ear/Nose/Mouth/Throat Complaints and Symptoms: No Complaints or Symptoms Hematologic/Lymphatic Complaints and Symptoms: No Complaints or Symptoms Medical History: Positive for: Anemia - takes iron Negative for: Hemophilia; Human Immunodeficiency Virus; Lymphedema; Sickle Cell  Disease Respiratory Complaints and Symptoms: No Complaints or Symptoms Medical History: Negative for: Aspiration; Asthma; Chronic Obstructive Pulmonary Disease (COPD); Pneumothorax; Sleep Apnea; Tuberculosis Gastrointestinal Complaints and Symptoms: No Complaints or Symptoms Medical HistoryJAYLIANNA, [name] ([id number]) Negative for: Cirrhosis ; Colitis; Crohnos; Hepatitis A; Hepatitis B; Hepatitis C Endocrine Complaints and Symptoms: No Complaints or Symptoms Medical History: Positive for: Type II Diabetes Negative for: Type I Diabetes Time with diabetes: 20 years Treated with: Insulin, Oral agents Blood sugar tested every day: Yes Tested : 3 times day Genitourinary Complaints and Symptoms: No Complaints or Symptoms Medical History: Negative for: End Stage Renal Disease Immunological Complaints and Symptoms: No Complaints or Symptoms Medical History: Negative for: Lupus Erythematosus; Raynaudos; Scleroderma Oncologic Complaints and Symptoms: No Complaints or Symptoms Medical History: Negative for: Received Chemotherapy; Received Radiation Psychiatric Complaints and Symptoms: No Complaints or Symptoms Medical History: Negative for: Anorexia/bulimia; Confinement Anxiety HBO Extended History Items Eyes: Cataracts [name], [name] ([id number]) Hospitalization / Surgery History Name of Hospital Purpose of Hospitalization/Surgery Date [facility] subdural hematoma 12/03/2014 Family and Social History Cancer: Yes - Mother, Father; Diabetes: Yes - Siblings; Heart Disease: Yes - Father; Hypertension: Yes - Siblings; Kidney Disease: No; Lung Disease: No; Seizures: No; Stroke: No; Thyroid Problems: Yes - Siblings; Former smoker; Marital Status - Married; Alcohol Use: Daily; Drug Use: No History; Caffeine Use: Daily; Living Will: No; Medical Power of Attorney: No Physician Affirmation I have reviewed and agree with the above information. Electronic  Signature(s) Signed: 01/11/2015 1:31:48 PM By: Christin Fudge MD, FACS Signed: 01/11/2015 5:06:19 PM By: Gretta Cool RN, BSN, Kim RN, BSN Entered By: Christin Fudge on 01/11/2015 13:31:46 Kuper,  [name] ([id number]) -------------------------------------------------------------------------------- SuperBill Details Patient Name: DILLYN, JOAQUIN. Date of Service: 01/11/2015 Medical Record Number: [id number] Patient Account Number: [id number] Date of Birth/Sex: Jan 04, 1946 (69 y.o. Female) Treating RN: Cornell Barman Primary Care Physician: Reed Breech Other Clinician: Referring Physician: Treating Physician/Extender: Frann Rider in Treatment: 0 Diagnosis Coding ICD-10 Codes Code Description E11.621 Type 2 diabetes mellitus with foot ulcer L97.512 Non-pressure chronic ulcer of other part of right foot with fat layer exposed Facility Procedures CPT4 Code Description: 67737366 11042 - DEB SUBQ TISSUE 20 SQ CM/< ICD-10 Description Diagnosis E11.621 Type 2 diabetes mellitus with foot ulcer L97.512 Non-pressure chronic ulcer of other part of right fo Modifier: ot with fat la Quantity: 1 yer exposed Physician Procedures CPT4 Code Description: 8159470 76151 - WC PHYS LEVEL 4 - NEW PT ICD-10 Description Diagnosis E11.621 Type 2 diabetes mellitus with foot ulcer L97.512 Non-pressure chronic ulcer of other part of right fo Modifier: ot with fat lay Quantity: 1 er exposed CPT4 Code Description: 8343735 78978 - WC PHYS SUBQ TISS 20 SQ CM ICD-10 Description Diagnosis E11.621 Type 2 diabetes mellitus with foot ulcer L97.512 Non-pressure chronic ulcer of other part of right fo Modifier: ot with fat lay Quantity: 1 er exposed Electronic Signature(s) Signed: 01/11/2015 2:44:24 PM By: Christin Fudge MD, FACS Entered By: Christin Fudge on 01/11/2015 14:44:24

## 2015-01-12 NOTE — Progress Notes (Signed)
[name] ([id number]) Visit Report for 01/11/2015 Allergy List Details Patient Name: [name], [name]. Date of Service: 01/11/2015 1:00 PM Medical Record Number: [id number] Patient Account Number: [id number] Date of Birth/Sex: January 25, 1946 (69 y.o. Female) Treating RN: Cornell Barman Primary Care Physician: Reed Breech Other Clinician: Referring Physician: Treating Physician/Extender: Frann Rider in Treatment: 0 Allergies Active Allergies No Known Drug Allergies Allergy Notes Electronic Signature(s) Signed: 01/11/2015 5:06:19 PM By: Gretta Cool, RN, BSN, Kim RN, BSN Entered By: Gretta Cool, RN, BSN, Kim on 01/11/2015 13:32:18 [name] ([id number]) -------------------------------------------------------------------------------- Arrival Information Details Patient Name: [name] Date of Service: 01/11/2015 1:00 PM Medical Record Number: [id number] Patient Account Number: [id number] Date of Birth/Sex: 1945-12-08 (69 y.o. Female) Treating RN: Cornell Barman Primary Care Physician: Reed Breech Other Clinician: Referring Physician: Treating Physician/Extender: Frann Rider in Treatment: 0 Visit Information Patient Arrived: Walker Arrival Time: 13:03 Accompanied By: husband Transfer Assistance: None Patient Identification Verified: Yes Secondary Verification Process Yes Completed: Patient Has Alerts: Yes Patient Alerts: Type II Diabetic Electronic Signature(s) Signed: 01/11/2015 5:06:19 PM By: Gretta Cool, RN, BSN, Kim RN, BSN Entered By: Gretta Cool, RN, BSN, Kim on 01/11/2015 13:05:56 [name] ([id number]) -------------------------------------------------------------------------------- Encounter Discharge Information Details Patient Name: [name] Date of Service: 01/11/2015 1:00 PM Medical Record Number: [id number] Patient Account Number: [id number] Date of Birth/Sex: December 28, 1945 (69 y.o. Female) Treating RN: Cornell Barman Primary Care  Physician: Reed Breech Other Clinician: Referring Physician: Treating Physician/Extender: Frann Rider in Treatment: 0 Encounter Discharge Information Items Schedule Follow-up Appointment: No Medication Reconciliation completed No and provided to Patient/Care Jahnasia Tatum: Provided on Clinical Summary of Care: 01/11/2015 Form Type Recipient Paper Patient BB Electronic Signature(s) Signed: 01/11/2015 2:06:51 PM By: Ruthine Dose Entered By: Ruthine Dose on 01/11/2015 14:06:50 [name] ([id number]) -------------------------------------------------------------------------------- Lower Extremity Assessment Details Patient Name: [name]. Date of Service: 01/11/2015 1:00 PM Medical Record Number: [id number] Patient Account Number: [id number] Date of Birth/Sex: 1945/05/06 (69 y.o. Female) Treating RN: Cornell Barman Primary Care Physician: Reed Breech Other Clinician: Referring Physician: Treating Physician/Extender: Frann Rider in Treatment: 0 Edema Assessment Assessed: [Left: No] [Right: No] E[Left: dema] [Right: :] Calf Left: Right: Point of Measurement: 36 cm From Medial Instep 35.8 cm 35.4 cm Ankle Left: Right: Point of Measurement: 10 cm From Medial Instep 26.8 cm 26.3 cm Vascular Assessment Pulses: Posterior Tibial Palpable: [Left:Yes] [Right:Yes] Doppler: [Right:Monophasic] Dorsalis Pedis Palpable: [Left:Yes] [Right:Yes] Doppler: [Right:Monophasic] Extremity colors, hair growth, and conditions: Extremity Color: [Left:Normal] [Right:Normal] Hair Growth on Extremity: [Left:No] [Right:No] Temperature of Extremity: [Left:Warm] [Right:Warm] Capillary Refill: [Right:< 3 seconds] Blood Pressure: Brachial: [Left:138] [Right:140] Dorsalis Pedis: 120 [Left:Dorsalis Pedis: 130] Ankle: Posterior Tibial: 122 [Left:Posterior Tibial: 122 0.87] [Right:0.93] Toe Nail Assessment Left: Right: Thick: Yes Discolored: Yes Deformed:  Yes Improper Length and Hygiene: Yes AIJA, SCARFO ([id number]) Notes Pitting Edema +3 Electronic Signature(s) Signed: 01/11/2015 5:06:19 PM By: Gretta Cool, RN, BSN, Kim RN, BSN Entered By: Gretta Cool, RN, BSN, Kim on 01/11/2015 13:43:51 [name] ([id number]) -------------------------------------------------------------------------------- Multi Wound Chart Details Patient Name: [name] Date of Service: 01/11/2015 1:00 PM Medical Record Number: [id number] Patient Account Number: [id number] Date of Birth/Sex: Feb 15, 1946 (69 y.o. Female) Treating RN: Cornell Barman Primary Care Physician: Reed Breech Other Clinician: Referring Physician: Treating Physician/Extender: Frann Rider in Treatment: 0 Vital Signs Height(in): Pulse(bpm): 74 Weight(lbs): 145 Blood Pressure 123/43 (mmHg): Body Mass Index(BMI): Temperature(F): 98.8 Respiratory Rate 18 (breaths/min): Photos: [1:No Photos] [N/A:N/A] Wound Location: [1:Right Metatarsal head first N/A]  Wounding Event: [1:Gradually Appeared] [N/A:N/A] Primary Etiology: [1:Dehisced Wound] [N/A:N/A] Comorbid History: [1:Cataracts, Anemia, Hypertension, Type II Diabetes, Osteoarthritis] [N/A:N/A] Date Acquired: [1:12/03/2014] [N/A:N/A] Weeks of Treatment: [1:0] [N/A:N/A] Wound Status: [1:Open] [N/A:N/A] Measurements L x W x D 2x1x0.4 [N/A:N/A] (cm) Area (cm) : [1:1.571] [N/A:N/A] Volume (cm) : [1:0.628] [N/A:N/A] % Reduction in Area: [1:0.00%] [N/A:N/A] % Reduction in Volume: 0.00% [N/A:N/A] Position 1 (o'clock): 11 Maximum Distance 1 1 (cm): Tunneling: [1:Yes] [N/A:N/A] Classification: [1:Full Thickness Without Exposed Support Structures] [N/A:N/A] HBO Classification: [1:Grade 2] [N/A:N/A] Exudate Amount: [1:Small] [N/A:N/A] Exudate Type: [1:Serous] [N/A:N/A] Exudate Color: [1:amber] [N/A:N/A] Wound Margin: [1:Indistinct, nonvisible] [N/A:N/A] Granulation Amount: [1:Small (1-33%)] [N/A:N/A] Necrotic Amount:  [1:Large (67-100%)] [N/A:N/A] Exposed Structures: Fascia: No N/A N/A Fat: No Tendon: No Muscle: No Joint: No Bone: No Limited to Skin Breakdown Periwound Skin Texture: Edema: No N/A N/A Excoriation: No Induration: No Callus: No Crepitus: No Fluctuance: No Friable: No Rash: No Scarring: No Periwound Skin Moist: Yes N/A N/A Moisture: Maceration: No Dry/Scaly: No Periwound Skin Color: Atrophie Blanche: No N/A N/A Cyanosis: No Ecchymosis: No Erythema: No Hemosiderin Staining: No Mottled: No Pallor: No Rubor: No Tenderness on No N/A N/A Palpation: Wound Preparation: Ulcer Cleansing: N/A N/A Rinsed/Irrigated with Saline Topical Anesthetic Applied: Other: lidociane 4% Treatment Notes Electronic Signature(s) Signed: 01/11/2015 5:06:19 PM By: Gretta Cool, RN, BSN, Kim RN, BSN Entered By: Gretta Cool, RN, BSN, Kim on 01/11/2015 13:48:20 [name] ([id number]) -------------------------------------------------------------------------------- Multi-Disciplinary Care Plan Details Patient Name: BELVA, KOZIEL. Date of Service: 01/11/2015 1:00 PM Medical Record Number: [id number] Patient Account Number: [id number] Date of Birth/Sex: Nov 21, 1945 (69 y.o. Female) Treating RN: Cornell Barman Primary Care Physician: Reed Breech Other Clinician: Referring Physician: Treating Physician/Extender: Frann Rider in Treatment: 0 Active Inactive Electronic Signature(s) Signed: 01/11/2015 5:06:19 PM By: Gretta Cool, RN, BSN, Kim RN, BSN Entered By: Gretta Cool, RN, BSN, Kim on 01/11/2015 13:33:41 [name] ([id number]) -------------------------------------------------------------------------------- Pain Assessment Details Patient Name: [name] Date of Service: 01/11/2015 1:00 PM Medical Record Number: [id number] Patient Account Number: [id number] Date of Birth/Sex: 01-Mar-1946 (69 y.o. Female) Treating RN: Cornell Barman Primary Care Physician: Reed Breech Other  Clinician: Referring Physician: Treating Physician/Extender: Frann Rider in Treatment: 0 Active Problems Location of Pain Severity and Description of Pain Patient Has Paino No Site Locations Pain Management and Medication Current Pain Management: Electronic Signature(s) Signed: 01/11/2015 5:06:19 PM By: Gretta Cool, RN, BSN, Kim RN, BSN Entered By: Gretta Cool, RN, BSN, Kim on 01/11/2015 13:04:41 [name] ([id number]) -------------------------------------------------------------------------------- Wound Assessment Details Patient Name: JAVEN, RIDINGS. Date of Service: 01/11/2015 1:00 PM Medical Record Number: [id number] Patient Account Number: [id number] Date of Birth/Sex: Sep 05, 1945 (69 y.o. Female) Treating RN: Cornell Barman Primary Care Physician: Reed Breech Other Clinician: Referring Physician: Treating Physician/Extender: Frann Rider in Treatment: 0 Wound Status Wound Number: 1 Primary Diabetic Wound/Ulcer of the Lower Etiology: Extremity Wound Location: Right Metatarsal head first Wound Open Wounding Event: Gradually Appeared Status: Date Acquired: 12/03/2014 Comorbid Cataracts, Anemia, Hypertension, Weeks Of Treatment: 0 History: Type II Diabetes, Osteoarthritis Clustered Wound: No Photos Photo Uploaded By: Gretta Cool, RN, BSN, Kim on 01/11/2015 17:03:09 Wound Measurements Length: (cm) 2 % Reduction in Width: (cm) 1 % Reduction in Depth: (cm) 0.4 Tunneling: Area: (cm) 1.571 Location 1 Volume: (cm) 0.628 Position Maximum Di Location 2 Position ( Maximum Di Area: 0% Volume: 0% Yes (o'clock): 11 stance: (cm) 1 o'clock): 3 stance: (cm) 1.2 Wound Description Classification: Grade 2 Wound Margin: Indistinct, nonvisible Exudate Amount: Small Exudate Type: Serous Exudate  Color: amber Foul Odor After Cleansing: No Wound Bed Nilan, Frimy C. ([id number]) Granulation Amount: Small (1-33%) Exposed Structure Granulation Quality:  Hyper-granulation Fascia Exposed: No Necrotic Amount: Large (67-100%) Fat Layer Exposed: No Necrotic Quality: Adherent Slough Tendon Exposed: No Muscle Exposed: No Joint Exposed: No Bone Exposed: No Limited to Skin Breakdown Periwound Skin Texture Texture Color No Abnormalities Noted: No No Abnormalities Noted: No Callus: No Atrophie Blanche: No Crepitus: No Cyanosis: No Excoriation: No Ecchymosis: No Fluctuance: No Erythema: No Friable: No Hemosiderin Staining: No Induration: No Mottled: No Localized Edema: No Pallor: No Rash: No Rubor: No Scarring: No Moisture No Abnormalities Noted: No Dry / Scaly: No Maceration: No Moist: Yes Wound Preparation Ulcer Cleansing: Rinsed/Irrigated with Saline Topical Anesthetic Applied: Other: lidociane 4%, Electronic Signature(s) Signed: 01/11/2015 5:06:19 PM By: Gretta Cool, RN, BSN, Kim RN, BSN Entered By: Gretta Cool, RN, BSN, Kim on 01/11/2015 13:51:36 [name] ([id number]) -------------------------------------------------------------------------------- Vitals Details Patient Name: [name] Date of Service: 01/11/2015 1:00 PM Medical Record Number: [id number] Patient Account Number: [id number] Date of Birth/Sex: 1946-02-23 (69 y.o. Female) Treating RN: Cornell Barman Primary Care Physician: Reed Breech Other Clinician: Referring Physician: Treating Physician/Extender: Frann Rider in Treatment: 0 Vital Signs Time Taken: 13:04 Temperature (F): 98.8 Weight (lbs): 145 Pulse (bpm): 74 Respiratory Rate (breaths/min): 18 Blood Pressure (mmHg): 123/43 Reference Range: 80 - 120 mg / dl Electronic Signature(s) Signed: 01/11/2015 5:06:19 PM By: Gretta Cool, RN, BSN, Kim RN, BSN Entered By: Gretta Cool, RN, BSN, Kim on 01/11/2015 13:05:13

## 2015-01-18 ENCOUNTER — Encounter: Payer: Medicare HMO | Admitting: Surgery

## 2015-01-18 DIAGNOSIS — L97512 Non-pressure chronic ulcer of other part of right foot with fat layer exposed: Secondary | ICD-10-CM | POA: Diagnosis not present

## 2015-01-18 NOTE — Progress Notes (Addendum)
DONTASIA, MIRANDA ([id number]) Visit Report for 01/18/2015 Chief Complaint Document Details Patient Name: [name], [name]. Date of Service: 01/18/2015 2:30 PM Medical Record Patient Account Number: [id number] Number: Afful, RN, BSN, Treating RN: [birth year] [phone] y.o. [name] Date of Birth/Sex: Female) Other Clinician: Primary Care Physician: Macario Carls, CHARLES Treating Christin Fudge Referring Physician: Reed Breech Physician/Extender: Suella Grove in Treatment: 1 Information Obtained from: Patient Chief Complaint Patients presents for treatment of an open diabetic ulcer. the patient has had a nonhealing wound of the right plantar aspect of foot near the great toe for about 2 months. Electronic Signature(s) Signed: 01/18/2015 2:56:30 PM By: Christin Fudge MD, FACS Entered By: Christin Fudge on 01/18/2015 14:56:30 [name] ([id number]) -------------------------------------------------------------------------------- Debridement Details Patient Name: [name] Date of Service: 01/18/2015 2:30 PM Medical Record Patient Account Number: [id number] Number: Afful, RN, BSN, Treating RN: October 11, 1945 [phone] y.o. [name] Date of Birth/Sex: Female) Other Clinician: Primary Care Physician: Macario Carls, CHARLES Treating Britto, Errol Referring Physician: Reed Breech Physician/Extender: Weeks in Treatment: 1 Debridement Performed for Wound #1 Right Metatarsal head first Assessment: Performed By: Physician Pat Patrick., MD Debridement: Debridement Pre-procedure Yes Verification/Time Out Taken: Start Time: 14:46 Pain Control: Lidocaine 4% Topical Solution Level: Skin/Subcutaneous Tissue Total Area Debrided (L x 0.9 (cm) x 1.7 (cm) = 1.53 (cm) W): Tissue and other Non-Viable, Exudate, Fibrin/Slough, Subcutaneous material debrided: Instrument: Curette Bleeding: Minimum Hemostasis Achieved: Pressure End Time: 14:48 Procedural Pain: 0 Post  Procedural Pain: 0 Response to Treatment: Procedure was tolerated well Post Debridement Measurements of Total Wound Length: (cm) 1 Width: (cm) 2 Depth: (cm) 0.5 Volume: (cm) 0.785 Post Procedure Diagnosis Same as Pre-procedure Electronic Signature(s) Signed: 01/18/2015 2:56:23 PM By: Christin Fudge MD, FACS Signed: 01/18/2015 4:40:49 PM By: Regan Lemming BSN, RN Entered By: Christin Fudge on 01/18/2015 14:56:23 [name] ([id number]) -------------------------------------------------------------------------------- HPI Details Patient Name: [name]. Date of Service: 01/18/2015 2:30 PM Medical Record Patient Account Number: [id number] Number: Afful, RN, BSN, Treating RN: 07/21/1945 [phone] y.o. [name] Date of Birth/Sex: Female) Other Clinician: Primary Care Physician: Macario Carls, CHARLES Treating Christin Fudge Referring Physician: Reed Breech Physician/Extender: Weeks in Treatment: 1 History of Present Illness Location: also on the right foot near the big toe Quality: Patient reports No Pain. Severity: Patient states wound are getting worse. Duration: Patient has had the wound for > 3 months prior to seeking treatment at the wound center Context: The wound appeared gradually over time Modifying Factors: Consults to this date include: was admitted to hospital and treated as an inpatient. Associated Signs and Symptoms: Patient reports having difficulty standing for long periods. HPI Description: 69 year old patient who recently had a subdural hematoma and not to have surgery in early August 2016. She is known to have diabetes hypertension and had altered mental status which required the surgery. During the admission she was noted to have pressure ulcers of the sacrum, also on the right foot and had stage III chronic kidney disease with a history of chronic data disease. over a period of time her sacral and back ulceration have all healed and the only  reason she is here is to consult [place] regarding her right foot. she is not on any antibiotics at the present time. 01/18/2015 -- X-ray of the right big toe shows soft tissue wound along the lateral aspect of the great toe without underlying foreign body or evidence of osteomyelitis. Electronic Signature(s) Signed: 01/18/2015 2:56:57 PM By: Christin Fudge MD, FACS Previous  Signature: 01/18/2015 2:33:00 PM Version By: Christin Fudge MD, FACS Entered By: Christin Fudge on 01/18/2015 14:56:57 [name] ([id number]) -------------------------------------------------------------------------------- Physical Exam Details Patient Name: [name]. Date of Service: 01/18/2015 2:30 PM Medical Record Patient Account Number: [id number] Number: Afful, RN, BSN, Treating RN: 1945-07-17 [phone] y.o. [name] Date of Birth/Sex: Female) Other Clinician: Primary Care Physician: Macario Carls, CHARLES Treating Christin Fudge Referring Physician: Reed Breech Physician/Extender: Weeks in Treatment: 1 Constitutional . Pulse regular. Respirations normal and unlabored. Afebrile. . Eyes Nonicteric. Reactive to light. Ears, Nose, Mouth, and Throat Lips, teeth, and gums WNL.Marland Kitchen Moist mucosa without lesions . Neck supple and nontender. No palpable supraclavicular or cervical adenopathy. Normal sized without goiter. Respiratory WNL. No retractions.. Cardiovascular Pedal Pulses WNL. No clubbing, cyanosis or edema. Chest Breasts symmetical and no nipple discharge.. Breast tissue WNL, no masses, lumps, or tenderness.. Lymphatic No adneopathy. No adenopathy. No adenopathy. Musculoskeletal Adexa without tenderness or enlargement.. Digits and nails w/o clubbing, cyanosis, infection, petechiae, ischemia, or inflammatory conditions.. Integumentary (Hair, Skin) No suspicious lesions. No crepitus or fluctuance. No peri-wound warmth or erythema. No masses.Marland Kitchen Psychiatric Judgement and insight Intact..  No evidence of depression, anxiety, or agitation.. Notes The wound on the right base of the big toe on the plantar aspect has significant callus, maceration and also has some debris at the depth. This is sharply debrided with a curette. Electronic Signature(s) Signed: 01/18/2015 2:58:16 PM By: Christin Fudge MD, FACS Entered By: Christin Fudge on 01/18/2015 14:58:16 [name] ([id number]) -------------------------------------------------------------------------------- Physician Orders Details Patient Name: [name] Date of Service: 01/18/2015 2:30 PM Medical Record Patient Account Number: [id number] Number: Afful, RN, BSN, Treating RN: 11/18/1945 [phone] y.o. [name] Date of Birth/Sex: Female) Other Clinician: Primary Care Physician: Macario Carls, CHARLES Treating Christin Fudge Referring Physician: Reed Breech Physician/Extender: Suella Grove in Treatment: 1 Verbal / Phone Orders: Yes Clinician: Afful, RN, BSN, Rita Read Back and Verified: Yes Diagnosis Coding Wound Cleansing Wound #1 Right Metatarsal head first o Clean wound with Normal Saline. Anesthetic Wound #1 Right Metatarsal head first o Topical Lidocaine 4% cream applied to wound bed prior to debridement Primary Wound Dressing Wound #1 Right Metatarsal head first o Aquacel Ag - Calcium Alginate AG Rope packed into tunnel at 11 and 3. Secondary Dressing Wound #1 Right Metatarsal head first o Gauze and Kerlix/Conform Dressing Change Frequency Wound #1 Right Metatarsal head first o Change dressing every day. Follow-up Appointments Wound #1 Right Metatarsal head first o Return Appointment in 1 week. Edema Control Wound #1 Right Metatarsal head first o Elevate legs to the level of the heart and pump ankles as often as possible Electronic Signature(s) Signed: 01/18/2015 2:56:40 PM By: Regan Lemming BSN, RN Signed: 01/18/2015 4:45:33 PM By: Christin Fudge MD, FACS [name], [name]  ([id number]) Entered By: Regan Lemming on 01/18/2015 14:56:39 [name] ([id number]) -------------------------------------------------------------------------------- Problem List Details Patient Name: [name], LADA. Date of Service: 01/18/2015 2:30 PM Medical Record Patient Account Number: [id number] Number: Afful, RN, BSN, Treating RN: November 19, 1945 [phone] y.o. 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 Date of Service: 01/18/2015 2:30 PM Medical Record Patient Account Number: [id number] Number: Afful, RN, BSN, Treating RN: 1946/02/28 [phone] y.o. [name] Date of Birth/Sex: Female) Other Clinician: Primary Care Physician: Macario Carls, CHARLES Treating Christin Fudge Referring Physician: Reed Breech Physician/Extender: Suella Grove in Treatment: 1 Subjective Chief Complaint Information obtained from Patient Patients presents for treatment of an open diabetic ulcer. the patient has had a nonhealing wound of the right plantar aspect of foot near the great toe for about 2 months. History of Present Illness (HPI) The following HPI elements were documented for the patient's wound: Location: also on the right foot near the big toe Quality: Patient  reports No Pain. Severity: Patient states wound are getting worse. Duration: Patient has had the wound for > 3 months prior to seeking treatment at the wound center Context: The wound appeared gradually over time Modifying Factors: Consults to this date include: was admitted to hospital and treated as an inpatient. Associated Signs and Symptoms: Patient reports having difficulty standing for long periods. 69 year old patient who recently had a subdural hematoma and not to have surgery in early August 2016. She is known to have diabetes hypertension and had altered mental status which required the surgery. During the admission she was noted to have pressure ulcers of the sacrum, also on the right foot and had stage III chronic kidney disease with a history of chronic data disease. over a period of time her sacral and back ulceration have all healed and the only reason she is here is to consult [place] regarding her right foot. she is not on any antibiotics at the present time. 01/18/2015 -- X-ray of the right big toe shows soft tissue wound along the lateral aspect of the great toe without underlying foreign body or evidence of osteomyelitis. Objective Constitutional [name], Krystal C. ([id number]) Pulse regular. Respirations normal and unlabored. Afebrile. Vitals Time Taken: 2:41 PM, Weight: 145 lbs, Temperature: 98.1 F, Pulse: 66 bpm, Respiratory Rate: 18 breaths/min, Blood Pressure: 129/47 mmHg. Eyes Nonicteric. Reactive to light. Ears, Nose, Mouth, and Throat Lips, teeth, and gums WNL.Marland Kitchen Moist mucosa without lesions . Neck supple and nontender. No palpable supraclavicular or cervical adenopathy. Normal sized without goiter. Respiratory WNL. No retractions.. Cardiovascular Pedal Pulses WNL. No clubbing, cyanosis or edema. Chest Breasts symmetical and no nipple discharge.. Breast tissue WNL, no masses, lumps, or tenderness.. Lymphatic No adneopathy. No adenopathy. No  adenopathy. Musculoskeletal Adexa without tenderness or enlargement.. Digits and nails w/o clubbing, cyanosis, infection, petechiae, ischemia, or inflammatory conditions.Marland Kitchen Psychiatric Judgement and insight Intact.. No evidence of depression, anxiety, or agitation.. General Notes: The wound on the right base of the big toe on the plantar aspect has significant callus, maceration and also has some debris at the depth. This is sharply debrided with a curette. Integumentary (Hair, Skin) No suspicious lesions. No crepitus or fluctuance. No peri-wound warmth or erythema. No masses.. Wound #1 status is Open. Original cause of wound was Gradually Appeared. The wound is located on the Right Metatarsal head first. The wound measures 0.9cm length x 1.7cm width x 0.4cm depth; 1.202cm^2 area and 0.481cm^3 volume. The wound is limited to skin breakdown. There is no undermining noted, however, there is tunneling at 3:00 with a maximum distance of 1cm. There is additional tunneling and at 11:00 with a maximum distance of 0.8cm. There is a small amount of serous drainage noted. The wound  margin is indistinct and nonvisible. There is large (67-100%) granulation within the wound bed. There is a small (1-33%) amount of necrotic tissue within the wound bed including Adherent Slough. The periwound skin appearance exhibited: Maceration, Moist. The periwound skin appearance did not exhibit: Callus, Crepitus, Excoriation, Fluctuance, Friable, Induration, Localized Edema, Rash, Scarring, Dry/Scaly, Bubar, Krystal C. ([id number]) Atrophie Blanche, Cyanosis, Ecchymosis, Hemosiderin Staining, Mottled, Pallor, Rubor, Erythema. Assessment Active Problems ICD-10 E11.621 - Type 2 diabetes mellitus with foot ulcer L97.512 - Non-pressure chronic ulcer of other part of right foot with fat layer exposed After sharply debriding this wound and recommended we continue packing with silver alginate and make sure that it is tucked  into the depths of the wound. She will continue with offloading and most of the day is off her feet. She will come back and see me next week. Procedures Wound #1 Wound #1 is a Diabetic Wound/Ulcer of the Lower Extremity located on the Right Metatarsal head first . There was a Skin/Subcutaneous Tissue Debridement ([id number]) debridement with total area of 1.53 sq cm performed by Pat Patrick., MD. with the following instrument(s): Curette to remove Non-Viable tissue/material including Exudate, Fibrin/Slough, and Subcutaneous after achieving pain control using Lidocaine 4% Topical Solution. A time out was conducted prior to the start of the procedure. A Minimum amount of bleeding was controlled with Pressure. The procedure was tolerated well with a pain level of 0 throughout and a pain level of 0 following the procedure. Post Debridement Measurements: 1cm length x 2cm width x 0.5cm depth; 0.785cm^3 volume. Post procedure Diagnosis Wound #1: Same as Pre-Procedure Plan Wound Cleansing: Wound #1 Right Metatarsal head first: Clean wound with Normal Saline. Anesthetic: LATRONDA, SPINK ([id number]) Wound #1 Right Metatarsal head first: Topical Lidocaine 4% cream applied to wound bed prior to debridement Primary Wound Dressing: Wound #1 Right Metatarsal head first: Aquacel Ag - Calcium Alginate AG Rope packed into tunnel at 11 and 3. Secondary Dressing: Wound #1 Right Metatarsal head first: Gauze and Kerlix/Conform Dressing Change Frequency: Wound #1 Right Metatarsal head first: Change dressing every day. Follow-up Appointments: Wound #1 Right Metatarsal head first: Return Appointment in 1 week. Edema Control: Wound #1 Right Metatarsal head first: Elevate legs to the level of the heart and pump ankles as often as possible After sharply debriding this wound and recommended we continue packing with silver alginate and make sure that it is tucked into the depths of the wound. She will  continue with offloading and most of the day is off her feet. She will come back and see me next week. Electronic Signature(s) Signed: 01/18/2015 2:59:17 PM By: Christin Fudge MD, FACS Entered By: Christin Fudge on 01/18/2015 14:59:17 [name] ([id number]) -------------------------------------------------------------------------------- SuperBill Details Patient Name: [name] Date of Service: 01/18/2015 Medical Record Patient Account Number: [id number] Number: Afful, RN, BSN, Treating RN: 1946/03/01 [phone] y.o. 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 Facility Procedures CPT4 Code Description: [id number] - DEB SUBQ TISSUE 20 SQ CM/< ICD-10 Description Diagnosis E11.621 Type 2 diabetes mellitus with foot ulcer L97.512 Non-pressure chronic ulcer of other part of right fo Modifier: ot with fat lay Quantity: 1 er exposed Physician Procedures CPT4 Code Description: [id number] -  WC PHYS SUBQ TISS 20 SQ CM ICD-10 Description Diagnosis E11.621 Type 2 diabetes mellitus with foot ulcer L97.512 Non-pressure chronic ulcer of other part of right fo Modifier: ot with fat laye Quantity: 1 r exposed Electronic Signature(s) Signed: 01/18/2015 2:59:33 PM By: Christin Fudge MD, FACS Entered By: Christin Fudge on 01/18/2015 14:59:33

## 2015-01-19 NOTE — Progress Notes (Signed)
[name] ([id number]) Visit Report for 01/18/2015 Arrival Information Details Patient Name: [name]. Date of Service: 01/18/2015 2:30 PM Medical Record Number: [id number] Patient Account Number: [id number] Date of Birth/Sex: March 12, 1946 (69 y.o. Female) Treating RN: Afful, RN, BSN, Velva Harman Primary Care Physician: Reed Breech Other Clinician: Referring Physician: Reed Breech Treating Physician/Extender: Frann Rider in Treatment: 1 Visit Information History Since Last Visit Any new allergies or adverse reactions: No Patient Arrived: [name] Had a fall or experienced change in No Arrival Time: 14:38 activities of daily living that may affect Accompanied By: SPOUSE risk of falls: Transfer Assistance: None Signs or symptoms of abuse/neglect since last No Patient Identification Verified: Yes visito Secondary Verification Process Yes Hospitalized since last visit: No Completed: Has Dressing in Place as Prescribed: Yes Patient Has Alerts: Yes Pain Present Now: No Patient Alerts: Type II Diabetic Electronic Signature(s) Signed: 01/18/2015 4:40:49 PM By: Regan Lemming BSN, RN Entered By: Regan Lemming on 01/18/2015 14:40:00 [name] ([id number]) -------------------------------------------------------------------------------- Encounter Discharge Information Details Patient Name: [name] Date of Service: 01/18/2015 2:30 PM Medical Record Number: [id number] Patient Account Number: [id number] Date of Birth/Sex: 14-Mar-1946 (69 y.o. Female) Treating RN: Baruch Gouty, RN, BSN, Velva Harman Primary Care Physician: Reed Breech Other Clinician: Referring Physician: Reed Breech Treating Physician/Extender: Frann Rider in Treatment: 1 Encounter Discharge Information Items Discharge Pain Level: 0 Discharge Condition: Stable Ambulatory Status: Walker Discharge Destination: Nursing Home Transportation: Other Accompanied By:  spouse Schedule Follow-up Appointment: No Medication Reconciliation completed No and provided to Patient/Care Provider: Provided on Clinical Summary of Care: 01/18/2015 Form Type Recipient Paper Patient BB Electronic Signature(s) Signed: 01/18/2015 2:56:42 PM By: Ruthine Dose Entered By: Ruthine Dose on 01/18/2015 14:56:41 [name] ([id number]) -------------------------------------------------------------------------------- Lower Extremity Assessment Details Patient Name: [name]. Date of Service: 01/18/2015 2:30 PM Medical Record Number: [id number] Patient Account Number: [id number] Date of Birth/Sex: Aug 09, 1945 (69 y.o. Female) Treating RN: Afful, RN, BSN, Velva Harman Primary Care Physician: Reed Breech Other Clinician: Referring Physician: Reed Breech Treating Physician/Extender: Frann Rider in Treatment: 1 Vascular Assessment Pulses: Posterior Tibial Dorsalis Pedis Palpable: [Right:Yes] Extremity colors, hair growth, and conditions: Extremity Color: [Right:Normal] Hair Growth on Extremity: [Right:Yes] Temperature of Extremity: [Right:Warm] Capillary Refill: [Right:< 3 seconds] Toe Nail Assessment Left: Right: Thick: Yes Discolored: Yes Deformed: Yes Improper Length and Hygiene: Yes Electronic Signature(s) Signed: 01/18/2015 4:40:49 PM By: Regan Lemming BSN, RN Entered By: Regan Lemming on 01/18/2015 14:43:25 [name] ([id number]) -------------------------------------------------------------------------------- Multi Wound Chart Details Patient Name: [name] Date of Service: 01/18/2015 2:30 PM Medical Record Number: [id number] Patient Account Number: [id number] Date of Birth/Sex: 1945/07/31 (69 y.o. Female) Treating RN: Baruch Gouty, RN, BSN, Velva Harman Primary Care Physician: Reed Breech Other Clinician: Referring Physician: Reed Breech Treating Physician/Extender: Frann Rider in Treatment: 1 Vital  Signs Height(in): Pulse(bpm): 66 Weight(lbs): 145 Blood Pressure 129/47 (mmHg): Body Mass Index(BMI): Temperature(F): 98.1 Respiratory Rate 18 (breaths/min): Photos: [1:No Photos] [N/A:N/A] Wound Location: [1:Right Metatarsal head first N/A] Wounding Event: [1:Gradually Appeared] [N/A:N/A] Primary Etiology: [1:Diabetic Wound/Ulcer of N/A the Lower Extremity] Comorbid History: [1:Cataracts, Anemia, Hypertension, Type II Diabetes, Osteoarthritis] [N/A:N/A] Date Acquired: [1:12/03/2014] [N/A:N/A] Weeks of Treatment: [1:1] [N/A:N/A] Wound Status: [1:Open] [N/A:N/A] Measurements L x W x D 0.9x1.7x0.4 [N/A:N/A] (cm) Area (cm) : [1:1.202] [N/A:N/A] Volume (cm) : [1:0.481] [N/A:N/A] % Reduction in Area: [1:23.50%] [N/A:N/A] % Reduction in Volume: 23.40% [N/A:N/A] Position 1 (o'clock): 3 Maximum Distance 1 1 (cm): Position 2 (o'clock): 11 Maximum  Distance 2 0.8 (cm): Tunneling: [1:Yes] [N/A:N/A] Classification: [1:Grade 2] [N/A:N/A] Exudate Amount: [1:Small] [N/A:N/A] Exudate Type: [1:Serous] [N/A:N/A] Exudate Color: [1:amber] [N/A:N/A] Foul Odor After [1:Yes] [N/A:N/A] Cleansing: [name], [name] ([id number]) Odor Anticipated Due to No N/A N/A Product Use: Wound Margin: Indistinct, nonvisible N/A N/A Granulation Amount: Large (67-100%) N/A N/A Necrotic Amount: Small (1-33%) N/A N/A Exposed Structures: Fascia: No N/A N/A Fat: No Tendon: No Muscle: No Joint: No Bone: No Limited to Skin Breakdown Debridement: Debridement (08144- N/A N/A 11047) Time-Out Taken: Yes N/A N/A Pain Control: Lidocaine 4% Topical N/A N/A Solution Tissue Debrided: Fibrin/Slough, Exudates, N/A N/A Subcutaneous Level: Skin/Subcutaneous N/A N/A Tissue Debridement Area (sq 1.53 N/A N/A cm): Instrument: Curette N/A N/A Bleeding: Minimum N/A N/A Hemostasis Achieved: Pressure N/A N/A Procedural Pain: 0 N/A N/A Post Procedural Pain: 0 N/A N/A Debridement Treatment Procedure was tolerated  N/A N/A Response: well Post Debridement 1x2x0.5 N/A N/A Measurements L x W x D (cm) Post Debridement 0.785 N/A N/A Volume: (cm) Periwound Skin Texture: Edema: No N/A N/A Excoriation: No Induration: No Callus: No Crepitus: No Fluctuance: No Friable: No Rash: No Scarring: No Periwound Skin Maceration: Yes N/A N/A Moisture: Moist: Yes Dry/Scaly: No Periwound Skin Color: Atrophie Blanche: No N/A N/A Cyanosis: No Staffieri, Kayton C. ([id number]) Ecchymosis: No Erythema: No Hemosiderin Staining: No Mottled: No Pallor: No Rubor: No Tenderness on No N/A N/A Palpation: Wound Preparation: Ulcer Cleansing: N/A N/A Rinsed/Irrigated with Saline Topical Anesthetic Applied: Other: lidociane 4% Procedures Performed: Debridement N/A N/A Treatment Notes Electronic Signature(s) Signed: 01/18/2015 4:40:49 PM By: Regan Lemming BSN, RN Entered By: Regan Lemming on 01/18/2015 14:50:26 [name] ([id number]) -------------------------------------------------------------------------------- Multi-Disciplinary Care Plan Details Patient Name: [name] Date of Service: 01/18/2015 2:30 PM Medical Record Number: [id number] Patient Account Number: [id number] Date of Birth/Sex: 09/16/1945 (69 y.o. Female) Treating RN: Afful, RN, BSN, Velva Harman Primary Care Physician: Reed Breech Other Clinician: Referring Physician: Reed Breech Treating Physician/Extender: Frann Rider in Treatment: 1 Active Inactive Electronic Signature(s) Signed: 01/18/2015 4:40:49 PM By: Regan Lemming BSN, RN Entered By: Regan Lemming on 01/18/2015 14:47:13 [name] ([id number]) -------------------------------------------------------------------------------- Pain Assessment Details Patient Name: [name] Date of Service: 01/18/2015 2:30 PM Medical Record Number: [id number] Patient Account Number: [id number] Date of Birth/Sex: [birth year] (69 y.o. Female) Treating RN: Baruch Gouty, RN,  BSN, Velva Harman Primary Care Physician: Reed Breech Other Clinician: Referring Physician: Reed Breech Treating Physician/Extender: Frann Rider in Treatment: 1 Active Problems Location of Pain Severity and Description of Pain Patient Has Paino No Site Locations Pain Management and Medication Current Pain Management: Electronic Signature(s) Signed: 01/18/2015 4:40:49 PM By: Regan Lemming BSN, RN Entered By: Regan Lemming on 01/18/2015 14:40:07 [name] ([id number]) -------------------------------------------------------------------------------- Patient/Caregiver Education Details Patient Name: [name] Date of Service: 01/18/2015 2:30 PM Medical Record Number: [id number] Patient Account Number: [id number] Date of Birth/Gender: 07-13-1945 (69 y.o. Female) Treating RN: Afful, RN, BSN, Velva Harman Primary Care Physician: Reed Breech Other Clinician: Referring Physician: Reed Breech Treating Physician/Extender: Frann Rider in Treatment: 1 Education Assessment Education Provided To: Patient and Caregiver Education Topics Provided Wound/Skin Impairment: Methods: Explain/Verbal Responses: State content correctly Electronic Signature(s) Signed: 01/18/2015 4:40:49 PM By: Regan Lemming BSN, RN Entered By: Regan Lemming on 01/18/2015 14:51:40 [name] ([id number]) -------------------------------------------------------------------------------- Wound Assessment Details Patient Name: [name] Date of Service: 01/18/2015 2:30 PM Medical Record Number: [id number] Patient Account Number: [id number] Date of Birth/Sex: 1946/03/31 (69 y.o. Female) Treating RN: Afful, RN, BSN,  Velva Harman Primary Care Physician: Reed Breech Other Clinician: Referring Physician: Reed Breech Treating Physician/Extender: Frann Rider in Treatment: 1 Wound Status Wound Number: 1 Primary Diabetic Wound/Ulcer of the Lower Etiology:  Extremity Wound Location: Right Metatarsal head first Wound Open Wounding Event: Gradually Appeared Status: Date Acquired: 12/03/2014 Comorbid Cataracts, Anemia, Hypertension, Weeks Of Treatment: 1 History: Type II Diabetes, Osteoarthritis Clustered Wound: No Photos Photo Uploaded By: Regan Lemming on 01/18/2015 15:12:59 Wound Measurements Length: (cm) 0.9 % Reduction in Width: (cm) 1.7 % Reduction in Depth: (cm) 0.4 Tunneling: Area: (cm) 1.202 Location 1 Volume: (cm) 0.481 Position Maximum D Location 2 Position Maximum D Area: 23.5% Volume: 23.4% Yes (o'clock): 3 istance: (cm) 1 (o'clock): 11 istance: (cm) 0.8 Undermining: No Wound Description Classification: Grade 2 Wound Margin: Indistinct, nonvisible Exudate Amount: Small Exudate Type: Serous Exudate Color: amber CLOTEE, SCHLICKER ([id number]) Foul Odor After Cleansing: Yes Due to Product Use: No Wound Bed Granulation Amount: Large (67-100%) Exposed Structure Granulation Quality: Hyper-granulation Fascia Exposed: No Necrotic Amount: Small (1-33%) Fat Layer Exposed: No Necrotic Quality: Adherent Slough Tendon Exposed: No Muscle Exposed: No Joint Exposed: No Bone Exposed: No Limited to Skin Breakdown Periwound Skin Texture Texture Color No Abnormalities Noted: No No Abnormalities Noted: No Callus: No Atrophie Blanche: No Crepitus: No Cyanosis: No Excoriation: No Ecchymosis: No Fluctuance: No Erythema: No Friable: No Hemosiderin Staining: No Induration: No Mottled: No Localized Edema: No Pallor: No Rash: No Rubor: No Scarring: No Moisture No Abnormalities Noted: No Dry / Scaly: No Maceration: Yes Moist: Yes Wound Preparation Ulcer Cleansing: Rinsed/Irrigated with Saline Topical Anesthetic Applied: Other: lidociane 4%, Treatment Notes Wound #1 (Right Metatarsal head first) 1. Cleansed with: Clean wound with Normal Saline 4. Dressing Applied: Aquacel Ag 5. Secondary Dressing  Applied Gauze and Kerlix/Conform 7. Secured with Recruitment consultant) Signed: 01/18/2015 4:40:49 PM By: Regan Lemming BSN, RN MICALIZZI, Nicanor Marsh ([id number]) Entered By: Regan Lemming on 01/18/2015 14:50:16 [name] ([id number]) -------------------------------------------------------------------------------- Vitals Details Patient Name: [name] Date of Service: 01/18/2015 2:30 PM Medical Record Number: [id number] Patient Account Number: [id number] Date of Birth/Sex: [birth year] (69 y.o. Female) Treating RN: Afful, RN, BSN, Velva Harman Primary Care Physician: Reed Breech Other Clinician: Referring Physician: Reed Breech Treating Physician/Extender: Frann Rider in Treatment: 1 Vital Signs Time Taken: 14:41 Temperature (F): 98.1 Weight (lbs): 145 Pulse (bpm): 66 Respiratory Rate (breaths/min): 18 Blood Pressure (mmHg): 129/47 Reference Range: 80 - 120 mg / dl Electronic Signature(s) Signed: 01/18/2015 4:40:49 PM By: Regan Lemming BSN, RN Entered By: Regan Lemming on 01/18/2015 14:41:38

## 2015-01-25 ENCOUNTER — Encounter: Payer: Medicare HMO | Admitting: Surgery

## 2015-01-25 DIAGNOSIS — L97512 Non-pressure chronic ulcer of other part of right foot with fat layer exposed: Secondary | ICD-10-CM | POA: Diagnosis not present

## 2015-01-26 NOTE — Progress Notes (Addendum)
[name], [name] ([id number]) Visit Report for 01/25/2015 Chief Complaint Document Details Patient Name: [name], [name]. Date of Service: 01/25/2015 3:30 PM Medical Record Patient Account Number: [id number] Number: Afful, RN, BSN, Treating RN: [birth year] [phone] y.o. [name] Date of Birth/Sex: Female) Other Clinician: Primary Care Physician: Macario Carls, CHARLES Treating Christin Fudge Referring Physician: Reed Breech Physician/Extender: Suella Grove in Treatment: 2 Information Obtained from: Patient Chief Complaint Patients presents for treatment of an open diabetic ulcer. the patient has had a nonhealing wound of the right plantar aspect of foot near the great toe for about 2 months. Electronic Signature(s) Signed: 01/25/2015 4:06:36 PM By: Christin Fudge MD, FACS Entered By: Christin Fudge on 01/25/2015 16:06:36 [name] ([id number]) -------------------------------------------------------------------------------- Debridement Details Patient Name: [name] Date of Service: 01/25/2015 3:30 PM Medical Record Patient Account Number: [id number] Number: Afful, RN, BSN, Treating RN: 26-Mar-1946 [phone] y.o. [name] Date of Birth/Sex: Female) Other Clinician: Primary Care Physician: Macario Carls, CHARLES Treating Britto, Errol Referring Physician: Reed Breech Physician/Extender: Weeks in Treatment: 2 Debridement Performed for Wound #1 Right Metatarsal head first Assessment: Performed By: Physician Pat Patrick., MD Debridement: Debridement Pre-procedure Yes Verification/Time Out Taken: Start Time: 15:55 Pain Control: Other : lidocaine 4% Level: Skin/Subcutaneous Tissue Total Area Debrided (L x 1 (cm) x 4 (cm) = 4 (cm) W): Tissue and other Viable, Non-Viable, Exudate, Fibrin/Slough, Subcutaneous material debrided: Instrument: Forceps Bleeding: Minimum Hemostasis Achieved: Pressure End Time: 16:00 Procedural Pain: 0 Post Procedural  Pain: 0 Response to Treatment: Procedure was tolerated well Post Debridement Measurements of Total Wound Length: (cm) 1 Width: (cm) 4 Depth: (cm) 0.5 Volume: (cm) 1.571 Post Procedure Diagnosis Same as Pre-procedure Electronic Signature(s) Signed: 01/25/2015 4:06:29 PM By: Christin Fudge MD, FACS Signed: 01/25/2015 4:21:14 PM By: Regan Lemming BSN, RN Entered By: Christin Fudge on 01/25/2015 16:06:29 [name] ([id number]) -------------------------------------------------------------------------------- HPI Details Patient Name: [name]. Date of Service: 01/25/2015 3:30 PM Medical Record Patient Account Number: [id number] Number: Afful, RN, BSN, Treating RN: [birth year] [phone] y.o. [name] Date of Birth/Sex: Female) Other Clinician: Primary Care Physician: Macario Carls, CHARLES Treating Christin Fudge Referring Physician: Reed Breech Physician/Extender: Weeks in Treatment: 2 History of Present Illness Location: also on the right foot near the big toe Quality: Patient reports No Pain. Severity: Patient states wound are getting worse. Duration: Patient has had the wound for > 3 months prior to seeking treatment at the wound center Context: The wound appeared gradually over time Modifying Factors: Consults to this date include: was admitted to hospital and treated as an inpatient. Associated Signs and Symptoms: Patient reports having difficulty standing for long periods. HPI Description: 69 year old patient who recently had a subdural hematoma and not to have surgery in early August 2016. She is known to have diabetes hypertension and had altered mental status which required the surgery. During the admission she was noted to have pressure ulcers of the sacrum, also on the right foot and had stage III chronic kidney disease with a history of chronic data disease. over a period of time her sacral and back ulceration have all healed and the only reason she is  here is to consult [place] regarding her right foot. she is not on any antibiotics at the present time. 01/18/2015 -- X-ray of the right big toe shows soft tissue wound along the lateral aspect of the great toe without underlying foreign body or evidence of osteomyelitis. 01/25/2015 -- the patient says for the last 2 days the  toes a bit swollen but there is no purulent discharge. He also noticed that there is a opening on the dorsum of the medial part of the right toe. Electronic Signature(s) Signed: 01/25/2015 4:07:06 PM By: Christin Fudge MD, FACS Entered By: Christin Fudge on 01/25/2015 16:07:06 [name] ([id number]) -------------------------------------------------------------------------------- Physical Exam Details Patient Name: [name] Date of Service: 01/25/2015 3:30 PM Medical Record Patient Account Number: [id number] Number: Afful, RN, BSN, Treating RN: 01-16-1946 [phone] y.o. [name] Date of Birth/Sex: Female) Other Clinician: Primary Care Physician: Macario Carls, CHARLES Treating Christin Fudge Referring Physician: Reed Breech Physician/Extender: Weeks in Treatment: 2 Constitutional . Pulse regular. Respirations normal and unlabored. Afebrile. . Eyes Nonicteric. Reactive to light. Ears, Nose, Mouth, and Throat Lips, teeth, and gums WNL.Marland Kitchen Moist mucosa without lesions . Neck supple and nontender. No palpable supraclavicular or cervical adenopathy. Normal sized without goiter. Respiratory WNL. No retractions.. Cardiovascular Pedal Pulses WNL. No clubbing, cyanosis or edema. Lymphatic No adneopathy. No adenopathy. No adenopathy. Musculoskeletal Adexa without tenderness or enlargement.. Digits and nails w/o clubbing, cyanosis, infection, petechiae, ischemia, or inflammatory conditions.. Integumentary (Hair, Skin) No suspicious lesions. No crepitus or fluctuance. No peri-wound warmth or erythema. No masses.Marland Kitchen Psychiatric Judgement and insight  Intact.. No evidence of depression, anxiety, or agitation.. Notes The plantar medial wound on the right big toe now communicates with the Renato Battles and there is some debris which was sharply debrided. Irrigation of this with saline does not reveal any purulent material and there is not much undermining. There is some swelling of the toe but no gross cellulitis or pus discharge. Electronic Signature(s) Signed: 01/25/2015 4:08:02 PM By: Christin Fudge MD, FACS Entered By: Christin Fudge on 01/25/2015 16:08:02 [name] ([id number]) -------------------------------------------------------------------------------- Physician Orders Details Patient Name: [name], [name] 01/25/2015 3:30 Date of Service: PM Medical Record [id number] Number: Patient Account Number: [id number] 12-05-1945 (68 y.o. Treating RN: Cornell Barman Date of Birth/Sex: Female) Other Clinician: Primary Care Physician: Macario Carls, CHARLES Treating Christin Fudge Referring Physician: Reed Breech Physician/Extender: Suella Grove in Treatment: 2 Verbal / Phone Orders: Yes Clinician: Cornell Barman Read Back and Verified: Yes Diagnosis Coding Wound Cleansing Wound #1 Right Metatarsal head first o Clean wound with Normal Saline. Anesthetic Wound #1 Right Metatarsal head first o Topical Lidocaine 4% cream applied to wound bed prior to debridement Primary Wound Dressing Wound #1 Right Metatarsal head first o Aquacel Ag - Calcium Alginate AG Rope packed wound Secondary Dressing Wound #1 Right Metatarsal head first o Gauze and Kerlix/Conform Dressing Change Frequency Wound #1 Right Metatarsal head first o Change dressing every day. Follow-up Appointments Wound #1 Right Metatarsal head first o Return Appointment in 1 week. Edema Control Wound #1 Right Metatarsal head first o Elevate legs to the level of the heart and pump ankles as often as possible Medications-please add to medication list. Wound #1 Right  Metatarsal head first o P.O. Antibiotics - Augmentin 875/125 dispense 20; BID for 10 days [name], [name] ([id number]) Electronic Signature(s) Signed: 01/25/2015 4:18:47 PM By: Christin Fudge MD, FACS Signed: 01/25/2015 5:14:03 PM By: Gretta Cool RN, BSN, Kim RN, BSN Previous Signature: 01/25/2015 4:15:54 PM Version By: Christin Fudge MD, FACS Entered By: Gretta Cool RN, BSN, Kim on 01/25/2015 16:16:41 [name] ([id number]) -------------------------------------------------------------------------------- Problem List Details Patient Name: [name], [name]. Date of Service: 01/25/2015 3:30 PM Medical Record Patient Account Number: [id number] Number: Afful, RN, BSN, Treating RN: 1946-02-07 [phone] y.o. 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 [name] Date of Birth/Sex: Female) Other Clinician: Primary Care Physician: Macario Carls, CHARLES Treating Christin Fudge Referring Physician: Reed Breech Physician/Extender: Suella Grove in Treatment: 2 Subjective Chief Complaint Information obtained from  Patient Patients presents for treatment of an open diabetic ulcer. the patient has had a nonhealing wound of the right plantar aspect of foot near the great toe for about 2 months. History of Present Illness (HPI) The following HPI elements were documented for the patient's wound: Location: also on the right foot near the big toe Quality: Patient reports No Pain. Severity: Patient states wound are getting worse. Duration: Patient has had the wound for > 3 months prior to seeking treatment at the wound center Context: The wound appeared gradually over time Modifying Factors: Consults to this date include: was admitted to hospital and treated as an inpatient. Associated Signs and Symptoms: Patient reports having difficulty standing for long periods. 69 year old patient who recently had a subdural hematoma and not to have surgery in early August 2016. She is known to have diabetes hypertension and had altered mental status which required the surgery. During the admission she was noted to have pressure ulcers of the sacrum, also on the right foot and had stage III chronic kidney disease with a history of chronic data disease. over a period of time her sacral and back ulceration have all healed and the only reason she is here is to consult [place] regarding her right foot. she is not on any antibiotics at the present time. 01/18/2015 -- X-ray of the right big toe shows soft tissue wound along the lateral aspect of the great toe without underlying foreign body or evidence of osteomyelitis. 01/25/2015 -- the patient says for the last 2 days the toes a bit swollen but there is no purulent discharge. He also noticed that there is a opening on the dorsum of the medial part of the right toe. Objective [name], Krystal C. ([id number]) Constitutional Pulse regular. Respirations normal and unlabored. Afebrile. Vitals Time Taken: 3:32 PM, Weight: 145 lbs, Temperature: 98.7 F, Pulse: 82 bpm, Respiratory Rate:  18 breaths/min, Blood Pressure: 176/63 mmHg. Eyes Nonicteric. Reactive to light. Ears, Nose, Mouth, and Throat Lips, teeth, and gums WNL.Marland Kitchen Moist mucosa without lesions . Neck supple and nontender. No palpable supraclavicular or cervical adenopathy. Normal sized without goiter. Respiratory WNL. No retractions.. Cardiovascular Pedal Pulses WNL. No clubbing, cyanosis or edema. Lymphatic No adneopathy. No adenopathy. No adenopathy. Musculoskeletal Adexa without tenderness or enlargement.. Digits and nails w/o clubbing, cyanosis, infection, petechiae, ischemia, or inflammatory conditions.Marland Kitchen Psychiatric Judgement and insight Intact.. No evidence of depression, anxiety, or agitation.. General Notes: The plantar medial wound on the right big toe now communicates with the Renato Battles and there is some debris which was sharply debrided. Irrigation of this with saline does not reveal any purulent material and there is not much undermining. There is some swelling of the  toe but no gross cellulitis or pus discharge. Integumentary (Hair, Skin) No suspicious lesions. No crepitus or fluctuance. No peri-wound warmth or erythema. No masses.. Wound #1 status is Open. Original cause of wound was Gradually Appeared. The wound is located on the Right Metatarsal head first. The wound measures 1cm length x 4cm width x 0.5cm depth; 3.142cm^2 area and 1.571cm^3 volume. The wound is limited to skin breakdown. There is a small amount of serous drainage noted. The wound margin is indistinct and nonvisible. There is large (67-100%) granulation within the wound bed. There is a small (1-33%) amount of necrotic tissue within the wound bed including Adherent Slough. The periwound skin appearance exhibited: Maceration, Moist. The periwound skin appearance did not exhibit: Callus, Crepitus, Excoriation, Fluctuance, Friable, Induration, Localized Edema, Rash, Scarring, Dry/Scaly, Atrophie Blanche, Cyanosis, Ecchymosis,  Hemosiderin Staining, Mottled, Pallor, Rubor, Erythema. [name], [name] ([id number]) General Notes: Converted 2 wounds into 1 wound today Wound #2 status is Converted. Original cause of wound was Gradually Appeared. The wound is located on the Right,Proximal Metatarsal head first. The wound measures 0cm length x 0cm width x 0cm depth; 0cm^2 area and 0cm^3 volume. The wound is limited to skin breakdown. There is no tunneling or undermining noted. The wound margin is distinct with the outline attached to the wound base. There is large (67-100%) red granulation within the wound bed. There is a small (1-33%) amount of necrotic tissue within the wound bed including Adherent Slough. The periwound skin appearance exhibited: Moist. The periwound skin appearance did not exhibit: Callus, Crepitus, Excoriation, Fluctuance, Friable, Induration, Localized Edema, Rash, Scarring, Dry/Scaly, Maceration, Atrophie Blanche, Cyanosis, Ecchymosis, Hemosiderin Staining, Mottled, Pallor, Rubor, Erythema. Periwound temperature was noted as No Abnormality. Assessment Active Problems ICD-10 E11.621 - Type 2 diabetes mellitus with foot ulcer L97.512 - Non-pressure chronic ulcer of other part of right foot with fat layer exposed I recommend we packed this wound with silver alginate rope and I have discussed with the patient and her husband that I am going to start her on Augmentin by mouth twice a day for 10 days. If this toe becomes red has pus discharge or has any significant change she should report to the ER immediately for further evaluation x-rays and possible admission. I've also asked her to call us back on Monday to let us know how she is doing. Procedures Wound #1 Wound #1 is a Diabetic Wound/Ulcer of the Lower Extremity located on the Right Metatarsal head first . There was a Skin/Subcutaneous Tissue Debridement ([id number]) debridement with total area of 4 sq cm performed by Britto, Jackson Latino., MD. with  the following instrument(s): Forceps to remove Viable and Non- Viable tissue/material including Exudate, Fibrin/Slough, and Subcutaneous after achieving pain control using Other (lidocaine 4%). A time out was conducted prior to the start of the procedure. A Minimum amount of bleeding was controlled with Pressure. The procedure was tolerated well with a pain level of 0 throughout and a pain level of 0 following the procedure. Post Debridement Measurements: 1cm length x 4cm width x 0.5cm depth; 1.571cm^3 volume. Post procedure Diagnosis Wound #1: Same as Pre-Procedure Shinall, Krystal C. ([id number]) Plan Wound Cleansing: Wound #1 Right Metatarsal head first: Clean wound with Normal Saline. Anesthetic: Wound #1 Right Metatarsal head first: Topical Lidocaine 4% cream applied to wound bed prior to debridement Primary Wound Dressing: Wound #1 Right Metatarsal head first: Aquacel Ag - Calcium Alginate AG Rope packed wound Secondary Dressing: Wound #1 Right Metatarsal head first: Gauze and Kerlix/Conform Dressing  Change Frequency: Wound #1 Right Metatarsal head first: Change dressing every day. Follow-up Appointments: Wound #1 Right Metatarsal head first: Return Appointment in 1 week. Edema Control: Wound #1 Right Metatarsal head first: Elevate legs to the level of the heart and pump ankles as often as possible Medications-please add to medication list.: Wound #1 Right Metatarsal head first: P.O. Antibiotics - Augmentin 875/125 dispense 20; BID for 10 days I recommend we packed this wound with silver alginate rope and I have discussed with the patient and her husband that I am going to start her on Augmentin by mouth twice a day for 10 days. If this toe becomes red has pus discharge or has any significant change she should report to the ER immediately for further evaluation x-rays and possible admission. I've also asked her to call us back on Monday to let us know how she is  doing. Electronic Signature(s) Signed: 01/28/2015 4:28:24 PM By: Christin Fudge MD, FACS Previous Signature: 01/25/2015 4:09:02 PM Version By: Christin Fudge MD, FACS Entered By: Christin Fudge on 01/28/2015 16:28:24 [name], [name] ([id number], [name] ([id number]) -------------------------------------------------------------------------------- SuperBill Details Patient Name: [name] Date of Service: 01/25/2015 Medical Record Patient Account Number: [id number] Number: Afful, RN, BSN, Treating RN: 1945/05/06 [phone] y.o. 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 Facility Procedures CPT4 Code Description: [id number] 11042 - DEB SUBQ TISSUE 20 SQ CM/< ICD-10 Description Diagnosis E11.621 Type 2 diabetes mellitus with foot ulcer L97.512 Non-pressure chronic ulcer of other part of right fo Modifier: ot with fat lay Quantity: 1 er exposed Physician Procedures CPT4 Code Description: [id number] - WC PHYS SUBQ TISS 20 SQ CM ICD-10 Description Diagnosis E11.621 Type 2 diabetes mellitus with foot ulcer L97.512 Non-pressure chronic ulcer of other part of right fo Modifier: ot with fat laye Quantity: 1 r exposed Electronic Signature(s) Signed: 01/25/2015 4:09:10 PM By: Christin Fudge MD, FACS Entered By: Christin Fudge on 01/25/2015 16:09:10

## 2015-01-26 NOTE — Progress Notes (Addendum)
KELLYJO, EDGREN ([id number]) Visit Report for 01/25/2015 Arrival Information Details Patient Name: [name], [name]. Date of Service: 01/25/2015 3:30 PM Medical Record Number: [id number] Patient Account Number: [id number] Date of Birth/Sex: 1945/10/26 (69 y.o. Female) Treating RN: Afful, RN, BSN, Velva Harman Primary Care Physician: Reed Breech Other Clinician: Referring Physician: Reed Breech Treating Physician/Extender: Frann Rider in Treatment: 2 Visit Information History Since Last Visit Added or deleted any medications: No Patient Arrived: Walker Any new allergies or adverse reactions: No Arrival Time: 15:29 Had a fall or experienced change in No Accompanied By: SPOUSE activities of daily living that may affect Transfer Assistance: None risk of falls: Patient Identification Verified: Yes Signs or symptoms of abuse/neglect since last No Secondary Verification Process Yes visito Completed: Hospitalized since last visit: No Patient Has Alerts: Yes Has Dressing in Place as Prescribed: Yes Patient Alerts: Type II Pain Present Now: No Diabetic Electronic Signature(s) Signed: 01/25/2015 4:08:15 PM By: Regan Lemming BSN, RN Previous Signature: 01/25/2015 3:31:10 PM Version By: Regan Lemming BSN, RN Entered By: Regan Lemming on 01/25/2015 16:08:15 [name] ([id number]) -------------------------------------------------------------------------------- Encounter Discharge Information Details Patient Name: [name] Date of Service: 01/25/2015 3:30 PM Medical Record Number: [id number] Patient Account Number: [id number] Date of Birth/Sex: [birth year] (69 y.o. Female) Treating RN: Baruch Gouty, RN, BSN, Velva Harman Primary Care Physician: Reed Breech Other Clinician: Referring Physician: Reed Breech Treating Physician/Extender: Frann Rider in Treatment: 2 Encounter Discharge Information Items Discharge Pain Level: 0 Discharge Condition:  Stable Ambulatory Status: Walker Discharge Destination: Home Private Transportation: Auto Schedule Follow-up Appointment: No Medication Reconciliation completed and No provided to Patient/Care Olesya Wike: Clinical Summary of Care: Electronic Signature(s) Signed: 01/25/2015 4:06:42 PM By: Regan Lemming BSN, RN Entered By: Regan Lemming on 01/25/2015 16:06:41 [name] ([id number]) -------------------------------------------------------------------------------- Lower Extremity Assessment Details Patient Name: [name] Date of Service: 01/25/2015 3:30 PM Medical Record Number: [id number] Patient Account Number: [id number] Date of Birth/Sex: 1946/03/02 (69 y.o. Female) Treating RN: Afful, RN, BSN, Velva Harman Primary Care Physician: Reed Breech Other Clinician: Referring Physician: Reed Breech Treating Physician/Extender: Frann Rider in Treatment: 2 Vascular Assessment Pulses: Posterior Tibial Dorsalis Pedis Palpable: [Right:Yes] Extremity colors, hair growth, and conditions: Extremity Color: [Right:Normal] Hair Growth on Extremity: [Right:No] Temperature of Extremity: [Right:Warm] Capillary Refill: [Right:< 3 seconds] Toe Nail Assessment Left: Right: Thick: Yes Discolored: Yes Deformed: Yes Improper Length and Hygiene: Yes Electronic Signature(s) Signed: 01/25/2015 3:34:36 PM By: Regan Lemming BSN, RN Entered By: Regan Lemming on 01/25/2015 15:34:36 [name] ([id number]) -------------------------------------------------------------------------------- Multi Wound Chart Details Patient Name: [name] Date of Service: 01/25/2015 3:30 PM Medical Record Number: [id number] Patient Account Number: [id number] Date of Birth/Sex: 1945-12-12 (69 y.o. 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] [2:the Lower Extremity] Comorbid History: [1:N/A] [2:Cataracts, Anemia, Hypertension, Type II Diabetes, Osteoarthritis] [N/A:N/A] Date Acquired: [1:12/03/2014] [2:01/16/2015] [N/A:N/A] Weeks of Treatment: [1:2] [2:0] [N/A:N/A] Wound Status: [1:Open] [2:Open] [N/A:N/A] Measurements L x W x D 0.6x1.9x0.5 [2:1x1x0.5] [N/A:N/A] (cm) Area (cm) : [1:0.895] [2:0.785] [N/A:N/A] Volume (cm) : [1:0.448] [  2:0.393] [N/A:N/A] % Reduction in Area: [1:43.00%] [2:0.00%] [N/A:N/A] % Reduction in Volume: 28.70% [2:0.00%] [N/A:N/A] Classification: [1:Grade 2] [2:Grade 1] [N/A:N/A] Foul Odor After [1:N/A] [2:Yes] [N/A:N/A] Cleansing: Odor Anticipated Due to N/A [2:No] [N/A:N/A] Product Use: Wound Margin: [1:N/A] [2:Distinct, outline attached N/A] Granulation Amount: [1:N/A] [2:Large (67-100%)] [N/A:N/A] Granulation Quality: [1:N/A] [2:Red] [N/A:N/A] Necrotic Amount: [1:N/A] [2:Small (1-33%)] [N/A:N/A] Epithelialization: [1:N/A] [2:None] [N/A:N/A] Periwound Skin Texture: No Abnormalities Noted Edema: No [2:Excoriation: No] [N/A:N/A] Induration: No Callus: No Crepitus: No Fluctuance: No Friable: No Rash: No Scarring: No Periwound Skin No Abnormalities Noted Moist: Yes N/A Moisture: Maceration: No Dry/Scaly: No Periwound Skin Color: No Abnormalities Noted Atrophie Blanche: No N/A Cyanosis: No Ecchymosis: No Erythema: No Hemosiderin Staining: No Mottled: No Pallor: No Rubor: No Temperature: N/A No  Abnormality N/A Tenderness on No No N/A Palpation: Wound Preparation: N/A Ulcer Cleansing: N/A Rinsed/Irrigated with Saline Topical Anesthetic Applied: Other: lidocaine 4% Treatment Notes Electronic Signature(s) Signed: 01/25/2015 5:14:03 PM By: Gretta Cool, RN, BSN, Kim RN, BSN Entered By: Gretta Cool, RN, BSN, Kim on 01/25/2015 15:57:56 [name] ([id number]) -------------------------------------------------------------------------------- Multi-Disciplinary Care Plan Details Patient Name: [name], [name]. Date of Service: 01/25/2015 3:30 PM Medical Record Number: [id number] Patient Account Number: [id number] Date of Birth/Sex: Aug 24, 1945 (69 y.o. Female) Treating RN: Cornell Barman Primary Care Physician: Reed Breech Other Clinician: Referring Physician: Reed Breech Treating Physician/Extender: Frann Rider in Treatment: 2 Active Inactive Abuse / Safety / Falls / Self Care Management Nursing Diagnoses: Impaired physical mobility Goals: Patient will remain injury free Date Initiated: 01/25/2015 Goal Status: Active Interventions: Assess fall risk on admission and as needed Notes: Nutrition Nursing Diagnoses: Imbalanced nutrition Goals: Patient/caregiver agrees to and verbalizes understanding of need to obtain nutritional consultation Date Initiated: 01/25/2015 Goal Status: Active Patient/caregiver will maintain therapeutic glucose control Date Initiated: 01/25/2015 Goal Status: Active Interventions: Assess patient nutrition upon admission and as needed per policy Treatment Activities: Nutrition-profile labs obtained as ordered : 01/25/2015 Notes: Orientation to the Wound Care Program MERLE, [place] ([id number]) Nursing Diagnoses: Knowledge deficit related to the wound healing center program Goals: Patient/caregiver will verbalize understanding of the [facility] Date Initiated: 01/25/2015 Goal Status:  Active Interventions: Provide education on orientation to the wound center Notes: Wound/Skin Impairment Nursing Diagnoses: Impaired tissue integrity Goals: Ulcer/skin breakdown will heal within 14 weeks Date Initiated: 01/25/2015 Goal Status: Active Interventions: Assess patient/caregiver ability to obtain necessary supplies Treatment Activities: Patient referred to home care : 01/25/2015 Notes: Electronic Signature(s) Signed: 01/25/2015 5:14:03 PM By: Gretta Cool, RN, BSN, Kim RN, BSN Entered By: Gretta Cool, RN, BSN, Kim on 01/25/2015 15:57:36 [name] ([id number]) -------------------------------------------------------------------------------- Pain Assessment Details Patient Name: [name] Date of Service: 01/25/2015 3:30 PM Medical Record Number: [id number] Patient Account Number: [id number] Date of Birth/Sex: [birth year] (68 y.o. Female) Treating RN: Baruch Gouty, RN, BSN, Velva Harman Primary Care Physician: Reed Breech Other Clinician: Referring Physician: Reed Breech Treating Physician/Extender: Frann Rider in Treatment: 2 Active Problems Location of Pain Severity and Description of Pain Patient Has Paino No Site Locations Pain Management and Medication Current Pain Management: Electronic Signature(s) Signed: 01/25/2015 3:32:10 PM By: Regan Lemming BSN, RN Entered By: Regan Lemming on 01/25/2015 15:32:10 [name] ([id number]) -------------------------------------------------------------------------------- Patient/Caregiver Education Details Patient Name: [name] Date of Service: 01/25/2015 3:30 PM Medical Record Number: [id number] Patient Account Number: [id number] Date of Birth/Gender: 1945/09/09 (69 y.o. Female) Treating RN: Afful, RN, BSN, Velva Harman Primary Care Physician: Reed Breech Other Clinician: Referring Physician: Reed Breech  Treating Physician/Extender: Frann Rider in Treatment: 2 Education  Assessment Education Provided To: Patient Education Topics Provided Welcome To [facility]: Methods: Explain/Verbal Responses: State content correctly Electronic Signature(s) Signed: 01/25/2015 4:06:52 PM By: Regan Lemming BSN, RN Entered By: Regan Lemming on 01/25/2015 16:06:51 [name] ([id number]) -------------------------------------------------------------------------------- Wound Assessment Details Patient Name: [name]. Date of Service: 01/25/2015 3:30 PM Medical Record Number: [id number] Patient Account Number: [id number] Date of Birth/Sex: May 22, 1945 (69 y.o. Female) Treating RN: Cornell Barman Primary Care Physician: Reed Breech Other Clinician: Referring Physician: Reed Breech Treating Physician/Extender: Frann Rider in Treatment: 2 Wound Status Wound Number: 1 Primary Diabetic Wound/Ulcer of the Lower Etiology: Extremity Wound Location: Right Metatarsal head first Wound Open Wounding Event: Gradually Appeared Status: Date Acquired: 12/03/2014 Comorbid Cataracts, Anemia, Hypertension, Weeks Of Treatment: 2 History: Type II Diabetes, Osteoarthritis Clustered Wound: No Photos Photo Uploaded By: Regan Lemming on 01/25/2015 16:16:33 Wound Measurements Length: (cm) 1 Width: (cm) 4 Depth: (cm) 0.5 Area: (cm) 3.142 Volume: (cm) 1.571 % Reduction in Area: -100% % Reduction in Volume: -150.2% Wound Description Classification: Grade 2 Wound Margin: Indistinct, nonvisible Exudate Amount: Small Exudate Type: Serous Exudate Color: amber Foul Odor After Cleansing: Yes Due to Product Use: No Wound Bed Granulation Amount: Large (67-100%) Exposed Structure Granulation Quality: Hyper-granulation Fascia Exposed: No Necrotic Amount: Small (1-33%) Fat Layer Exposed: No Necrotic Quality: Adherent Slough Tendon Exposed: No [name], Krystal C. ([id number]) Muscle Exposed: No Joint Exposed: No Bone Exposed: No Limited to Skin  Breakdown Periwound Skin Texture Texture Color No Abnormalities Noted: No No Abnormalities Noted: No Callus: No Atrophie Blanche: No Crepitus: No Cyanosis: No Excoriation: No Ecchymosis: No Fluctuance: No Erythema: No Friable: No Hemosiderin Staining: No Induration: No Mottled: No Localized Edema: No Pallor: No Rash: No Rubor: No Scarring: No Moisture No Abnormalities Noted: No Dry / Scaly: No Maceration: Yes Moist: Yes Wound Preparation Ulcer Cleansing: Rinsed/Irrigated with Saline Topical Anesthetic Applied: Other: lidociane 4%, Assessment Notes Converted 2 wounds into 1 wound today Treatment Notes Wound #1 (Right Metatarsal head first) 1. Cleansed with: Clean wound with Normal Saline 4. Dressing Applied: Aquacel Ag 5. Secondary Dressing Applied Gauze and Kerlix/Conform 7. Secured with Recruitment consultant) Signed: 01/25/2015 5:14:03 PM By: Gretta Cool, RN, BSN, Kim RN, BSN Entered By: Gretta Cool, RN, BSN, Kim on 01/25/2015 16:00:56 [name] ([id number]) -------------------------------------------------------------------------------- Wound Assessment Details Patient Name: [name], [name]. Date of Service: 01/25/2015 3:30 PM Medical Record Number: [id number] Patient Account Number: [id number] Date of Birth/Sex: Feb 09, 1946 (69 y.o. Female) Treating RN: Cornell Barman Primary Care Physician: Reed Breech Other Clinician: Referring Physician: Reed Breech Treating Physician/Extender: Frann Rider in Treatment: 2 Wound Status Wound Number: 2 Primary Diabetic Wound/Ulcer of the Lower Etiology: Extremity Wound Location: Right, Proximal Metatarsal head first Wound Converted Status: Wounding Event: Gradually Appeared Comorbid Cataracts, Anemia, Hypertension, Date Acquired: 01/16/2015 History: Type II Diabetes, Osteoarthritis Weeks Of Treatment: 0 Clustered Wound: No Photos Photo Uploaded By: Regan Lemming on 01/25/2015 16:16:34 Wound  Measurements Length: (cm) 0 % Reduction Width: (cm) 0 % Reduction Depth: (cm) 0 Epithelializ Area: (cm) 0 Tunneling: Volume: (cm) 0 Undermining in Area: 100% in Volume: 100% ation: None No : No Wound Description Classification: Grade 1 Wound Margin: Distinct, outline attached Foul Odor After Cleansing: Yes Due to Product Use: No Wound Bed Granulation Amount: Large (67-100%) Exposed Structure Granulation Quality: Red Fascia Exposed: No Necrotic Amount: Small (1-33%) Fat Layer Exposed: No Necrotic Quality: Adherent Slough Tendon Exposed:  No Muscle Exposed: No Joint Exposed: No [name], Krystal C. ([id number]) Bone Exposed: No Limited to Skin Breakdown Periwound Skin Texture Texture Color No Abnormalities Noted: No No Abnormalities Noted: No Callus: No Atrophie Blanche: No Crepitus: No Cyanosis: No Excoriation: No Ecchymosis: No Fluctuance: No Erythema: No Friable: No Hemosiderin Staining: No Induration: No Mottled: No Localized Edema: No Pallor: No Rash: No Rubor: No Scarring: No Temperature / Pain Moisture Temperature: No Abnormality No Abnormalities Noted: No Dry / Scaly: No Maceration: No Moist: Yes Wound Preparation Ulcer Cleansing: Rinsed/Irrigated with Saline Topical Anesthetic Applied: Other: lidocaine 4%, Electronic Signature(s) Signed: 01/25/2015 5:14:03 PM By: Gretta Cool RN, BSN, Kim RN, BSN Previous Signature: 01/25/2015 3:44:34 PM Version By: Regan Lemming BSN, RN Entered By: Gretta Cool, RN, BSN, Kim on 01/25/2015 15:59:25 [name] ([id number]) -------------------------------------------------------------------------------- Vitals Details Patient Name: [name] Date of Service: 01/25/2015 3:30 PM Medical Record Number: [id number] Patient Account Number: [id number] Date of Birth/Sex: 06/15/1945 (69 y.o. Female) Treating RN: Afful, RN, BSN, Velva Harman Primary Care Physician: Reed Breech Other Clinician: Referring Physician:  Reed Breech Treating Physician/Extender: Frann Rider in Treatment: 2 Vital Signs Time Taken: 15:32 Temperature (F): 98.7 Weight (lbs): 145 Pulse (bpm): 82 Respiratory Rate (breaths/min): 18 Blood Pressure (mmHg): 176/63 Reference Range: 80 - 120 mg / dl Electronic Signature(s) Signed: 01/25/2015 3:36:26 PM By: Regan Lemming BSN, RN Previous Signature: 01/25/2015 3:34:05 PM Version By: Regan Lemming BSN, RN Entered By: Regan Lemming on 01/25/2015 15:36:26

## 2015-01-29 ENCOUNTER — Inpatient Hospital Stay
Admission: EM | Admit: 2015-01-29 | Discharge: 2015-02-02 | DRG: 629 | Disposition: A | Discharge status: Home Health | Payer: Medicare HMO | Attending: Internal Medicine | Admitting: Internal Medicine

## 2015-01-29 ENCOUNTER — Observation Stay: Payer: Medicare HMO

## 2015-01-29 ENCOUNTER — Encounter: Payer: Medicare HMO | Admitting: Surgery

## 2015-01-29 ENCOUNTER — Encounter: Payer: Self-pay | Admitting: Emergency Medicine

## 2015-01-29 ENCOUNTER — Emergency Department: Payer: Medicare HMO

## 2015-01-29 DIAGNOSIS — L97512 Non-pressure chronic ulcer of other part of right foot with fat layer exposed: Secondary | ICD-10-CM | POA: Diagnosis not present

## 2015-01-29 DIAGNOSIS — Z79891 Long term (current) use of opiate analgesic: Secondary | ICD-10-CM

## 2015-01-29 DIAGNOSIS — N183 Chronic kidney disease, stage 3 unspecified: Secondary | ICD-10-CM

## 2015-01-29 DIAGNOSIS — E785 Hyperlipidemia, unspecified: Secondary | ICD-10-CM | POA: Diagnosis present

## 2015-01-29 DIAGNOSIS — I2510 Atherosclerotic heart disease of native coronary artery without angina pectoris: Secondary | ICD-10-CM | POA: Diagnosis present

## 2015-01-29 DIAGNOSIS — D638 Anemia in other chronic diseases classified elsewhere: Secondary | ICD-10-CM | POA: Diagnosis present

## 2015-01-29 DIAGNOSIS — I130 Hypertensive heart and chronic kidney disease with heart failure and stage 1 through stage 4 chronic kidney disease, or unspecified chronic kidney disease: Secondary | ICD-10-CM | POA: Diagnosis present

## 2015-01-29 DIAGNOSIS — H409 Unspecified glaucoma: Secondary | ICD-10-CM | POA: Diagnosis present

## 2015-01-29 DIAGNOSIS — E119 Type 2 diabetes mellitus without complications: Secondary | ICD-10-CM

## 2015-01-29 DIAGNOSIS — Z87891 Personal history of nicotine dependence: Secondary | ICD-10-CM

## 2015-01-29 DIAGNOSIS — Z794 Long term (current) use of insulin: Secondary | ICD-10-CM

## 2015-01-29 DIAGNOSIS — L089 Local infection of the skin and subcutaneous tissue, unspecified: Secondary | ICD-10-CM | POA: Diagnosis present

## 2015-01-29 DIAGNOSIS — D649 Anemia, unspecified: Secondary | ICD-10-CM | POA: Diagnosis present

## 2015-01-29 DIAGNOSIS — T148XXA Other injury of unspecified body region, initial encounter: Secondary | ICD-10-CM

## 2015-01-29 DIAGNOSIS — Z789 Other specified health status: Secondary | ICD-10-CM

## 2015-01-29 DIAGNOSIS — L97509 Non-pressure chronic ulcer of other part of unspecified foot with unspecified severity: Secondary | ICD-10-CM

## 2015-01-29 DIAGNOSIS — E11621 Type 2 diabetes mellitus with foot ulcer: Secondary | ICD-10-CM

## 2015-01-29 DIAGNOSIS — B952 Enterococcus as the cause of diseases classified elsewhere: Secondary | ICD-10-CM | POA: Diagnosis present

## 2015-01-29 DIAGNOSIS — L97519 Non-pressure chronic ulcer of other part of right foot with unspecified severity: Secondary | ICD-10-CM | POA: Diagnosis present

## 2015-01-29 DIAGNOSIS — E1122 Type 2 diabetes mellitus with diabetic chronic kidney disease: Secondary | ICD-10-CM | POA: Diagnosis present

## 2015-01-29 DIAGNOSIS — G629 Polyneuropathy, unspecified: Secondary | ICD-10-CM | POA: Diagnosis present

## 2015-01-29 DIAGNOSIS — B9620 Unspecified Escherichia coli [E. coli] as the cause of diseases classified elsewhere: Secondary | ICD-10-CM | POA: Diagnosis present

## 2015-01-29 DIAGNOSIS — Z452 Encounter for adjustment and management of vascular access device: Secondary | ICD-10-CM

## 2015-01-29 DIAGNOSIS — R609 Edema, unspecified: Secondary | ICD-10-CM

## 2015-01-29 DIAGNOSIS — Z79899 Other long term (current) drug therapy: Secondary | ICD-10-CM

## 2015-01-29 DIAGNOSIS — I5032 Chronic diastolic (congestive) heart failure: Secondary | ICD-10-CM | POA: Diagnosis present

## 2015-01-29 DIAGNOSIS — Z885 Allergy status to narcotic agent status: Secondary | ICD-10-CM

## 2015-01-29 DIAGNOSIS — L03031 Cellulitis of right toe: Secondary | ICD-10-CM | POA: Diagnosis present

## 2015-01-29 DIAGNOSIS — R531 Weakness: Secondary | ICD-10-CM | POA: Diagnosis present

## 2015-01-29 DIAGNOSIS — Z8673 Personal history of transient ischemic attack (TIA), and cerebral infarction without residual deficits: Secondary | ICD-10-CM

## 2015-01-29 DIAGNOSIS — E1169 Type 2 diabetes mellitus with other specified complication: Secondary | ICD-10-CM | POA: Diagnosis present

## 2015-01-29 HISTORY — DX: Cerebral infarction, unspecified: I63.9

## 2015-01-29 LAB — CBC WITH DIFFERENTIAL/PLATELET
BASOS ABS: 0 10*3/uL (ref 0–0.1)
BASOS PCT: 0 %
Eosinophils Absolute: 0.1 10*3/uL (ref 0–0.7)
Eosinophils Relative: 3 %
HEMATOCRIT: 21.6 % — AB (ref 35.0–47.0)
HEMOGLOBIN: 7 g/dL — AB (ref 12.0–16.0)
Lymphocytes Relative: 19 %
Lymphs Abs: 0.6 10*3/uL — ABNORMAL LOW (ref 1.0–3.6)
MCH: 28.3 pg (ref 26.0–34.0)
MCHC: 32.2 g/dL (ref 32.0–36.0)
MCV: 87.8 fL (ref 80.0–100.0)
MONOS PCT: 6 %
Monocytes Absolute: 0.2 10*3/uL (ref 0.2–0.9)
NEUTROS ABS: 2.4 10*3/uL (ref 1.4–6.5)
NEUTROS PCT: 72 %
Platelets: 251 10*3/uL (ref 150–440)
RBC: 2.46 MIL/uL — AB (ref 3.80–5.20)
RDW: 21.6 % — ABNORMAL HIGH (ref 11.5–14.5)
WBC: 3.3 10*3/uL — ABNORMAL LOW (ref 3.6–11.0)

## 2015-01-29 LAB — BASIC METABOLIC PANEL
ANION GAP: 7 (ref 5–15)
BUN: 36 mg/dL — ABNORMAL HIGH (ref 6–20)
CHLORIDE: 106 mmol/L (ref 101–111)
CO2: 21 mmol/L — AB (ref 22–32)
Calcium: 7.5 mg/dL — ABNORMAL LOW (ref 8.9–10.3)
Creatinine, Ser: 1.38 mg/dL — ABNORMAL HIGH (ref 0.44–1.00)
GFR calc non Af Amer: 38 mL/min — ABNORMAL LOW (ref >60)
GFR, EST AFRICAN AMERICAN: 44 mL/min — AB (ref >60)
Glucose, Bld: 85 mg/dL (ref 65–99)
Potassium: 3 mmol/L — ABNORMAL LOW (ref 3.5–5.1)
Sodium: 134 mmol/L — ABNORMAL LOW (ref 135–145)

## 2015-01-29 LAB — GLUCOSE, CAPILLARY: Glucose-Capillary: 63 mg/dL — ABNORMAL LOW (ref 65–99)

## 2015-01-29 MED ORDER — POTASSIUM CHLORIDE CRYS ER 20 MEQ PO TBCR: 40.0000 meq | EXTENDED_RELEASE_TABLET | Freq: Once | ORAL | Status: DC

## 2015-01-29 MED ORDER — ACETAMINOPHEN 325 MG PO TABS
650.0000 mg | ORAL_TABLET | Freq: Four times a day (QID) | ORAL | Status: DC | PRN
  Administered 2015-01-30: 650 mg via ORAL
  Filled 2015-01-29 (×2): qty 2

## 2015-01-29 MED ORDER — HEPARIN SODIUM (PORCINE) 5000 UNIT/ML IJ SOLN
5000.0000 [IU] | Freq: Three times a day (TID) | INTRAMUSCULAR | Status: DC
  Administered 2015-01-29: 5000 [IU] via SUBCUTANEOUS
  Filled 2015-01-29: qty 1

## 2015-01-29 MED ORDER — FUROSEMIDE 40 MG PO TABS
40.0000 mg | ORAL_TABLET | Freq: Two times a day (BID) | ORAL | Status: DC
  Administered 2015-01-29 – 2015-02-02 (×8): 40 mg via ORAL
  Filled 2015-01-29 (×8): qty 1

## 2015-01-29 MED ORDER — PROPRANOLOL HCL 40 MG PO TABS
40.0000 mg | ORAL_TABLET | Freq: Three times a day (TID) | ORAL | Status: DC
  Administered 2015-01-29 – 2015-02-02 (×12): 40 mg via ORAL
  Filled 2015-01-29 (×13): qty 1

## 2015-01-29 MED ORDER — POTASSIUM CHLORIDE CRYS ER 20 MEQ PO TBCR
20.0000 meq | EXTENDED_RELEASE_TABLET | Freq: Every day | ORAL | Status: DC
  Administered 2015-01-29 – 2015-02-02 (×5): 20 meq via ORAL
  Filled 2015-01-29 (×5): qty 1

## 2015-01-29 MED ORDER — ACETAMINOPHEN 650 MG RE SUPP: 650.0000 mg | Freq: Four times a day (QID) | RECTAL | Status: DC | PRN

## 2015-01-29 MED ORDER — INSULIN ASPART 100 UNIT/ML ~~LOC~~ SOLN
0.0000 [IU] | Freq: Every day | SUBCUTANEOUS | Status: DC
  Filled 2015-01-29: qty 2

## 2015-01-29 MED ORDER — DOCUSATE SODIUM 100 MG PO CAPS
100.0000 mg | ORAL_CAPSULE | Freq: Two times a day (BID) | ORAL | Status: DC
  Administered 2015-01-29 – 2015-02-02 (×8): 100 mg via ORAL
  Filled 2015-01-29 (×8): qty 1

## 2015-01-29 MED ORDER — INSULIN ASPART 100 UNIT/ML ~~LOC~~ SOLN
0.0000 [IU] | Freq: Three times a day (TID) | SUBCUTANEOUS | Status: DC
  Administered 2015-01-31: 2 [IU] via SUBCUTANEOUS
  Administered 2015-02-01 (×2): 1 [IU] via SUBCUTANEOUS
  Administered 2015-02-02: 2 [IU] via SUBCUTANEOUS
  Filled 2015-01-29: qty 1
  Filled 2015-01-29: qty 2
  Filled 2015-01-29 (×2): qty 1

## 2015-01-29 MED ORDER — RAMIPRIL 5 MG PO CAPS
5.0000 mg | ORAL_CAPSULE | Freq: Every day | ORAL | Status: DC
  Administered 2015-01-29 – 2015-01-31 (×3): 5 mg via ORAL
  Filled 2015-01-29 (×3): qty 1

## 2015-01-29 MED ORDER — HYDRALAZINE HCL 50 MG PO TABS
100.0000 mg | ORAL_TABLET | Freq: Three times a day (TID) | ORAL | Status: DC
  Administered 2015-01-29 – 2015-02-01 (×10): 100 mg via ORAL
  Administered 2015-02-02: 50 mg via ORAL
  Administered 2015-02-02: 100 mg via ORAL
  Administered 2015-02-02: 50 mg via ORAL
  Filled 2015-01-29 (×12): qty 2

## 2015-01-29 MED ORDER — POTASSIUM CHLORIDE CRYS ER 20 MEQ PO TBCR
40.0000 meq | EXTENDED_RELEASE_TABLET | Freq: Once | ORAL | Status: AC
  Administered 2015-01-29: 40 meq via ORAL
  Filled 2015-01-29: qty 2

## 2015-01-29 MED ORDER — ONDANSETRON HCL 4 MG/2ML IJ SOLN: 4.0000 mg | Freq: Four times a day (QID) | INTRAMUSCULAR | Status: DC | PRN

## 2015-01-29 MED ORDER — SIMVASTATIN 20 MG PO TABS
20.0000 mg | ORAL_TABLET | Freq: Every day | ORAL | Status: DC
  Administered 2015-01-29 – 2015-02-01 (×4): 20 mg via ORAL
  Filled 2015-01-29 (×4): qty 1

## 2015-01-29 MED ORDER — VANCOMYCIN HCL IN DEXTROSE 1-5 GM/200ML-% IV SOLN
1000.0000 mg | Freq: Once | INTRAVENOUS | Status: AC
  Administered 2015-01-29: 1000 mg via INTRAVENOUS
  Filled 2015-01-29: qty 200

## 2015-01-29 MED ORDER — HYDROCODONE-ACETAMINOPHEN 5-325 MG PO TABS
1.0000 | ORAL_TABLET | ORAL | Status: DC | PRN
  Administered 2015-02-01 (×2): 1 via ORAL
  Administered 2015-02-02: 2 via ORAL
  Filled 2015-01-29: qty 1
  Filled 2015-01-29: qty 2
  Filled 2015-01-29: qty 1

## 2015-01-29 MED ORDER — NITROGLYCERIN 0.4 MG SL SUBL: 0.4000 mg | SUBLINGUAL_TABLET | SUBLINGUAL | Status: DC | PRN

## 2015-01-29 MED ORDER — ONDANSETRON HCL 4 MG PO TABS: 4.0000 mg | ORAL_TABLET | Freq: Four times a day (QID) | ORAL | Status: DC | PRN

## 2015-01-29 MED ORDER — PIPERACILLIN-TAZOBACTAM 3.375 G IVPB
3.3750 g | Freq: Three times a day (TID) | INTRAVENOUS | Status: DC
  Administered 2015-01-29 – 2015-02-01 (×8): 3.375 g via INTRAVENOUS
  Filled 2015-01-29 (×12): qty 50

## 2015-01-29 MED ORDER — PANTOPRAZOLE SODIUM 40 MG PO TBEC
40.0000 mg | DELAYED_RELEASE_TABLET | Freq: Every day | ORAL | Status: DC
  Administered 2015-01-29 – 2015-02-02 (×5): 40 mg via ORAL
  Filled 2015-01-29 (×5): qty 1

## 2015-01-29 MED ORDER — POLYETHYLENE GLYCOL 3350 17 G PO PACK
17.0000 g | PACK | Freq: Every day | ORAL | Status: DC | PRN
  Administered 2015-01-30: 17 g via ORAL
  Filled 2015-01-29: qty 1

## 2015-01-29 MED ORDER — VANCOMYCIN HCL IN DEXTROSE 1-5 GM/200ML-% IV SOLN
1000.0000 mg | INTRAVENOUS | Status: DC
  Administered 2015-01-30 – 2015-02-01 (×3): 1000 mg via INTRAVENOUS
  Filled 2015-01-29 (×4): qty 200

## 2015-01-29 MED ORDER — ISOSORBIDE DINITRATE 10 MG PO TABS
30.0000 mg | ORAL_TABLET | Freq: Two times a day (BID) | ORAL | Status: DC
  Administered 2015-01-29 – 2015-02-02 (×8): 30 mg via ORAL
  Filled 2015-01-29 (×7): qty 3
  Filled 2015-01-29: qty 1
  Filled 2015-01-29: qty 3

## 2015-01-29 MED ORDER — FERROUS SULFATE 325 (65 FE) MG PO TABS
325.0000 mg | ORAL_TABLET | Freq: Every day | ORAL | Status: DC
  Administered 2015-01-29 – 2015-02-02 (×5): 325 mg via ORAL
  Filled 2015-01-29 (×5): qty 1

## 2015-01-29 NOTE — ED Notes (Signed)
States she was sent in from wound center for eval to right great toe . Right great toe erd and swollen with open wound noted

## 2015-01-29 NOTE — ED Provider Notes (Addendum)
[facility] Emergency Department Provider Note  ____________________________________________  Time seen: Approximately 6:05 PM  I have reviewed the triage vital signs and the nursing notes.   HISTORY  Chief Complaint Toe Pain    HPI [name] is a 69 y.o. female who has a history of a diabetic foot ulcer on her right great toe, states that it became infected about one week ago. She was placed on an antibiotic but she is not sure which one and she has been taking. She has not had any fevers or feel otherwise ill but the redness is now leaving the toe and she was told to come here for admission. She has had no fever no chills no nausea no vomiting. There is increased drainage however. Dysuria no urinary frequency no chest pain or shortness of breath no other associated symptoms.The legs to feel somewhat swollen to her especially the right but this appears to be chronic. Patient is a very poor historian.  Past Medical History  Diagnosis Date  . Hypertension   . Diabetes mellitus without complication   . Anemia   . Coronary artery disease   . CHF (congestive heart failure)   . Glaucoma   . Hyperlipidemia   . Chronic kidney disease   . Neuropathy   . Chronic anemia     Patient Active Problem List   Diagnosis Date Noted  . Subdural hematoma 12/06/2014  . Pressure ulcer 12/05/2014  . SDH (subdural hematoma) 12/03/2014  . Chronic diastolic heart failure 53/61/4431  . Melena 10/02/2014  . DM (diabetes mellitus) 02/04/2010  . ANEMIA, CHRONIC 02/04/2010  . Essential hypertension 02/04/2010  . Coronary atherosclerosis 02/04/2010    Past Surgical History  Procedure Laterality Date  . Cardiac catheterization    . Esophagogastroduodenoscopy (egd) with propofol N/A 09/24/2014    Elliott-gastritis & normal Billroth II changes   . Colonoscopy with propofol N/A 09/24/2014    Elliott-Incomplete secondary to prep  . Coronary angioplasty  2016    stent  placed  . Bilroth ii procedure    . Colonoscopy with propofol N/A 11/12/2014    Procedure: COLONOSCOPY WITH PROPOFOL;  Surgeon: Manya Silvas, MD;  Location: [facility] ENDOSCOPY;  Service: Endoscopy;  Laterality: N/A;  Trudee Kuster hole Right 12/06/2014    Procedure: Right burr hole  for subdural hematoma;  Surgeon: Newman Pies, MD;  Location: [facility] NEURO ORS;  Service: Neurosurgery;  Laterality: Right;    Current Outpatient Rx  Name  Route  Sig  Dispense  Refill  . acetaminophen (TYLENOL) 325 MG tablet   Oral   Take 2 tablets (650 mg total) by mouth every 4 (four) hours as needed for mild pain (or temp > 99 F).         . docusate sodium (COLACE) 100 MG capsule   Oral   Take 100 mg by mouth 2 (two) times daily.         . feeding supplement, GLUCERNA SHAKE, (GLUCERNA SHAKE) LIQD   Oral   Take 237 mLs by mouth 2 (two) times daily between meals.   60 Can   0   . feeding supplement, GLUCERNA SHAKE, (GLUCERNA SHAKE) LIQD   Oral   Take 237 mLs by mouth 3 (three) times daily between meals.      0   . ferrous sulfate 325 (65 FE) MG tablet   Oral   Take 325 mg by mouth daily.         . furosemide (LASIX) 40  MG tablet   Oral   Take 1 tablet (40 mg total) by mouth 2 (two) times daily.   60 tablet   5   . hydrALAZINE (APRESOLINE) 100 MG tablet   Oral   Take 50 mg by mouth 3 (three) times daily.          . insulin aspart (NOVOLOG) 100 UNIT/ML injection   Subcutaneous   Inject 0-9 Units into the skin 3 (three) times daily with meals.   10 mL   11   . isosorbide dinitrate (ISORDIL) 30 MG tablet   Oral   Take 30 mg by mouth 2 (two) times daily.         . nitroGLYCERIN (NITROSTAT) 0.4 MG SL tablet   Sublingual   Place 0.4 mg under the tongue every 5 (five) minutes as needed for chest pain.         . Omega-3 Fatty Acids (FISH OIL) 1000 MG CAPS   Oral   Take 1 capsule by mouth daily.         Marland Kitchen omeprazole (PRILOSEC) 20 MG capsule   Oral   Take 20 mg by mouth daily.          . potassium chloride SA (K-DUR,KLOR-CON) 20 MEQ tablet   Oral   Take 1 tablet (20 mEq total) by mouth daily.   30 tablet   5   . propranolol (INDERAL) 20 MG tablet   Oral   Take 40 mg by mouth 3 (three) times daily.         . ramipril (ALTACE) 5 MG capsule   Oral   Take 5 mg by mouth daily.         . simvastatin (ZOCOR) 20 MG tablet   Oral   Take 20 mg by mouth at bedtime.         . traMADol (ULTRAM) 50 MG tablet   Oral   Take 0.5-1 tablets (25-50 mg total) by mouth 3 (three) times daily as needed for moderate pain.   10 tablet   0     Allergies Codeine  Family History  Problem Relation Age of Onset  . Diabetes Mother   . Heart disease Father   . Diabetes Sister   . Lung cancer Brother   . Cancer Brother   . Diabetes Brother   . Diabetes Sister     Social History Social History  Substance Use Topics  . Smoking status: Former Research scientist (life sciences)  . Smokeless tobacco: Never Used     Comment: quit many years ago  . Alcohol Use: No    Review of Systems Constitutional: No fever/chills Eyes: No visual changes. ENT: No sore throat. No stiff neck no neck pain Cardiovascular: Denies chest pain. Respiratory: Denies shortness of breath. Gastrointestinal:   no vomiting.  No diarrhea.  No constipation. Genitourinary: Negative for dysuria. Musculoskeletal: See history of present illness Skin: See history of present illness Neurological: Negative for headaches, focal weakness or numbness. 10-point ROS otherwise negative.  ____________________________________________   PHYSICAL EXAM:  VITAL SIGNS: ED Triage Vitals  Enc Vitals Group     BP 01/29/15 1537 123/47 mmHg     Pulse Rate 01/29/15 1537 70     Resp 01/29/15 1537 18     Temp 01/29/15 1537 98.2 F (36.8 C)     Temp Source 01/29/15 1537 Oral     SpO2 01/29/15 1537 95 %     Weight 01/29/15 1543 145 lb (65.772 kg)     Height  01/29/15 1543 5\' 7"  (1.702 m)     Head Cir --      Peak Flow --      Pain  Score 01/29/15 1533 8     Pain Loc --      Pain Edu? --      Excl. in [place]? --     Constitutional: Alert and oriented. Well appearing and in no acute distress. Eyes: Conjunctivae are normal. PERRL. EOMI. Head: Atraumatic. Nose: No congestion/rhinnorhea. Mouth/Throat: Mucous membranes are moist.  Oropharynx non-erythematous. Neck: No stridor.   Nontender with no meningismus Cardiovascular: Normal rate, regular rhythm. Grossly normal heart sounds.  Good peripheral circulation. Respiratory: Normal respiratory effort.  No retractions. Lungs CTAB. Gastrointestinal: Soft and nontender. No distention. No abdominal bruits.  Back:  There is no focal tenderness or step off there is no midline tenderness there are no lesions noted. there is no CVA tenderness  Musculoskeletal: No lower extremity tenderness. No joint effusions, there is an obvious sialitis involving the great toe with a ulcer on the medial aspect of the toe. The cellulitis, redness and swelling does go up past the toe itself with no significant streaking. There is no crepitus. There is a. Malodorous discharge. She has bilateral leg swelling right is somewhat greater than left. Pulses are strong and symmetric.  Neurologic:  Normal speech and language. No gross focal neurologic deficits are appreciated.  Skin:  Skin is warm, dry and intact aside from that noted above Psychiatric: Mood and affect are normal. Speech and behavior are normal.  ____________________________________________   LABS (all labs ordered are listed, but only abnormal results are displayed)  Labs Reviewed  CBC WITH DIFFERENTIAL/PLATELET - Abnormal; Notable for the following:    WBC 3.3 (*)    RBC 2.46 (*)    Hemoglobin 7.0 (*)    HCT 21.6 (*)    RDW 21.6 (*)    Lymphs Abs 0.6 (*)    All other components within normal limits  BASIC METABOLIC PANEL - Abnormal; Notable for the following:    Sodium 134 (*)    Potassium 3.0 (*)    CO2 21 (*)    BUN 36 (*)     Creatinine, Ser 1.38 (*)    Calcium 7.5 (*)    GFR calc non Af Amer 38 (*)    GFR calc Af Amer 44 (*)    All other components within normal limits  WOUND CULTURE   ____________________________________________  EKG   ____________________________________________  RADIOLOGY   ____________________________________________   PROCEDURES  Procedure(s) performed: None  Critical Care performed: None  ____________________________________________   INITIAL IMPRESSION / ASSESSMENT AND PLAN / ED COURSE  Pertinent labs & imaging results that were available during my care of the patient were reviewed by me and considered in my medical decision making (see chart for details).  Patient with a diabetic foot ulcer which is infected and feeling outpatient treatment. We are giving antibiotics. Did discuss with the hospitalist. There is some degree of asymmetry between the lower extremities as the patient feels his baseline however we'll obtain a Doppler as well for inpatient follow-up. No evidence of sepsis we obtained blood cultures and we will give vancomycin. I do not palpate any crepitus and there is no evidence on x-ray of gas gangrene. ____________________________________________   FINAL CLINICAL IMPRESSION(S) / ED DIAGNOSES  Final diagnoses:  Toe infection  Swelling      Schuyler Amor, MD 01/29/15 1808  Schuyler Amor, MD 01/29/15 [phone]

## 2015-01-29 NOTE — ED Notes (Signed)
Patient transported to X-ray 

## 2015-01-29 NOTE — H&P (Signed)
[facility] [place] NAME: [name]    MR#:  [id number]  DATE OF BIRTH:  05/16/1945  DATE OF ADMISSION:  01/29/2015  PRIMARY CARE PHYSICIAN: Morton Peters, MD   REQUESTING/REFERRING PHYSICIAN: Dr Burlene Arnt  CHIEF COMPLAINT:   Chief Complaint  Patient presents with  . Toe Pain    HISTORY OF PRESENT ILLNESS:  [name]  is a 69 y.o. 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 She reports that she has been compliant with appointments at the wound care clinic and has had multiple debridements of the great toe. The wound has not healed very well and has now developed erythema over the entire great toe and second toe. She has bilateral lower extremity dependent edema which is chronic. She denies nausea vomiting diarrhea fevers or chills. Appetite has been intact. She has no other complaints at this time.  PAST MEDICAL HISTORY:   Past Medical History  Diagnosis Date  . Hypertension   . Diabetes mellitus without complication   . Anemia   . Coronary artery disease   . CHF (congestive heart failure)   . Glaucoma   . Hyperlipidemia   . Chronic kidney disease   . Neuropathy   . Chronic anemia    Of note admission 8/1 through 8/11 at [facility] for subacute subdural hematoma. She had a bur hole drilled to relieve pressure. She was then discharged to skilled nursing facility and she has been home for 5 days. Her only residual defect is generalized weakness. Currently using a walker for mobility.  2-D echocardiogram 09/2014 shows ejection fraction 60-65% with no valvular abnormalities  PAST SURGICAL HISTORY:   Past Surgical  History  Procedure Laterality Date  . Cardiac catheterization    . Esophagogastroduodenoscopy (egd) with propofol N/A 09/24/2014    Elliott-gastritis & normal Billroth II changes   . Colonoscopy with propofol N/A 09/24/2014    Elliott-Incomplete secondary to prep  . Coronary angioplasty  2016    stent placed  . Bilroth ii procedure    . Colonoscopy with propofol N/A 11/12/2014    Procedure: COLONOSCOPY WITH PROPOFOL;  Surgeon: Manya Silvas, MD;  Location: [facility] ENDOSCOPY;  Service: Endoscopy;  Laterality: N/A;  Trudee Kuster hole Right 12/06/2014    Procedure: Right burr hole  for subdural hematoma;  Surgeon: Newman Pies, MD;  Location: [facility] NEURO ORS;  Service: Neurosurgery;  Laterality: Right;    SOCIAL HISTORY:   Social History  Substance Use Topics  . Smoking status: Former Research scientist (life sciences)  . Smokeless tobacco: Never Used     Comment: quit many years ago  . Alcohol Use: No    FAMILY HISTORY:   Family History  Problem Relation Age of Onset  . Diabetes Mother   . Heart disease Father   . Diabetes Sister   . Lung cancer Brother   . Cancer Brother   . Diabetes Brother   . Diabetes Sister     DRUG ALLERGIES:   Allergies  Allergen Reactions  . Codeine Other (See Comments)    Reaction:  Hallucinations    REVIEW OF SYSTEMS:   Review of Systems  Constitutional: Negative for fever, chills, weight loss  and malaise/fatigue.  HENT: Negative for congestion and hearing loss.   Eyes: Negative for blurred vision and pain.  Respiratory: Negative for cough, hemoptysis, sputum production, shortness of breath and stridor.   Cardiovascular: Positive for leg swelling. Negative for chest pain, palpitations and orthopnea.  Gastrointestinal: Negative for nausea, vomiting, abdominal pain, diarrhea, constipation and blood in stool.  Genitourinary: Negative for dysuria and frequency.  Musculoskeletal: Negative for myalgias, back pain, joint pain and neck pain.  Skin: Negative for rash.   Neurological: Positive for weakness. Negative for focal weakness, loss of consciousness and headaches.  Endo/Heme/Allergies: Does not bruise/bleed easily.  Psychiatric/Behavioral: Negative for depression and hallucinations. The patient is not nervous/anxious.     MEDICATIONS AT HOME:   Prior to Admission medications   Medication Sig Start Date End Date Taking? Authorizing Provider  acetaminophen (TYLENOL) 325 MG tablet Take 2 tablets (650 mg total) by mouth every 4 (four) hours as needed for mild pain (or temp > 99 F). 12/13/14  Yes Delfina Redwood, MD  amoxicillin-clavulanate (AUGMENTIN) 875-125 MG tablet Take 1 tablet by mouth 2 (two) times daily. 01/25/15 02/04/15 Yes Historical Provider, MD  docusate sodium (COLACE) 100 MG capsule Take 100 mg by mouth 2 (two) times daily.   Yes Historical Provider, MD  feeding supplement, GLUCERNA SHAKE, (GLUCERNA SHAKE) LIQD Take 237 mLs by mouth 3 (three) times daily between meals. 12/13/14  Yes Delfina Redwood, MD  ferrous sulfate 325 (65 FE) MG tablet Take 325 mg by mouth daily.   Yes Historical Provider, MD  furosemide (LASIX) 40 MG tablet Take 1 tablet (40 mg total) by mouth 2 (two) times daily. 11/02/14  Yes Alisa Graff, FNP  glimepiride (AMARYL) 2 MG tablet Take 1 mg by mouth daily.   Yes Historical Provider, MD  hydrALAZINE (APRESOLINE) 100 MG tablet Take 100 mg by mouth 3 (three) times daily.    Yes Historical Provider, MD  insulin aspart (NOVOLOG) 100 UNIT/ML injection Inject 0-9 Units into the skin 3 (three) times daily with meals. Patient taking differently: Inject 0-9 Units into the skin 3 (three) times daily with meals. Pt uses based off of a sliding scale. 12/13/14  Yes Delfina Redwood, MD  isosorbide dinitrate (ISORDIL) 30 MG tablet Take 30 mg by mouth 2 (two) times daily.   Yes Historical Provider, MD  nitroGLYCERIN (NITROSTAT) 0.4 MG SL tablet Place 0.4 mg under the tongue every 5 (five) minutes as needed for chest pain.   Yes  Historical Provider, MD  Omega-3 Fatty Acids (FISH OIL) 1000 MG CAPS Take 1,000 mg by mouth daily.    Yes Historical Provider, MD  omeprazole (PRILOSEC) 20 MG capsule Take 20 mg by mouth daily.   Yes Historical Provider, MD  potassium chloride SA (K-DUR,KLOR-CON) 20 MEQ tablet Take 1 tablet (20 mEq total) by mouth daily. 11/02/14  Yes Alisa Graff, FNP  propranolol (INDERAL) 20 MG tablet Take 40 mg by mouth 3 (three) times daily.   Yes Historical Provider, MD  ramipril (ALTACE) 5 MG capsule Take 5 mg by mouth daily.   Yes Historical Provider, MD  simvastatin (ZOCOR) 20 MG tablet Take 20 mg by mouth at bedtime.   Yes Historical Provider, MD  traMADol (ULTRAM) 50 MG tablet Take 0.5-1 tablets (25-50 mg total) by mouth 3 (three) times daily as needed for moderate pain. 12/13/14  Yes Delfina Redwood, MD  feeding supplement, GLUCERNA SHAKE, (GLUCERNA SHAKE) LIQD Take 237 mLs by mouth 2 (two) times daily between  meals. Patient not taking: Reported on 01/29/2015 09/20/14   Loletha Grayer, MD      VITAL SIGNS:  Blood pressure 152/55, pulse 65, temperature 98.2 F (36.8 C), temperature source Oral, resp. rate 19, height 5\' 7"  (1.702 m), weight 65.772 kg (145 lb), SpO2 97 %.  PHYSICAL EXAMINATION:  GENERAL:  69 y.o.-year-old patient lying in the bed with no acute distress.  EYES: Pupils equal, round, reactive to light and accommodation. No scleral icterus. Extraocular muscles intact.  HEENT: Head atraumatic, normocephalic. Oropharynx and nasopharynx clear. Oral mucous membranes are moist NECK:  Supple, no jugular venous distention. No thyroid enlargement, no tenderness.  LUNGS: Normal breath sounds bilaterally, no wheezing, rales,rhonchi or crepitation. No use of accessory muscles of respiration.  CARDIOVASCULAR: S1, S2 normal. No murmurs, rubs, or gallops.  ABDOMEN: Soft, nontender, nondistended. Bowel sounds present. No organomegaly or mass.  EXTREMITIES: 2+ pedal edema bilaterally, no cyanosis, or  clubbing.  NEUROLOGIC: Cranial nerves II through XII are grossly intact. Muscle strength 5/5 in all extremities. Sensation intact. She seems to lean to the left  PSYCHIATRIC: The patient is alert and oriented x 3.  SKIN: A full skin thickness wound on the lateral right great toe, muscle is exposed, there is an odor but no obvious drainage, cellulitis over the entire right great toe and second toe extending over the ball of the foot   LABORATORY PANEL:   CBC  Recent Labs Lab 01/29/15 1606  WBC 3.3*  HGB 7.0*  HCT 21.6*  PLT 251   ------------------------------------------------------------------------------------------------------------------  Chemistries   Recent Labs Lab 01/29/15 1606  NA 134*  K 3.0*  CL 106  CO2 21*  GLUCOSE 85  BUN 36*  CREATININE 1.38*  CALCIUM 7.5*   ------------------------------------------------------------------------------------------------------------------  Cardiac Enzymes No results for input(s): TROPONINI in the last 168 hours. ------------------------------------------------------------------------------------------------------------------  RADIOLOGY:  Dg Toe Great Right  01/29/2015   CLINICAL DATA:  Right great toe pain with open wound. History of diabetes and chronic kidney disease. Subsequent encounter.  EXAM: RIGHT GREAT TOE  COMPARISON:  01/11/2015 radiographs.  FINDINGS: Soft tissue swelling and irregularity again noted. No definite residual soft tissue emphysema identified on the current study. There is no evidence of acute bone destruction, fracture or dislocation. First metatarsal phalangeal degenerative changes and diffuse vascular calcifications are again noted.  IMPRESSION: No radiographic evidence of osteomyelitis. Persistent soft tissue swelling without definite residual emphysema.   Electronically Signed   By: Richardean Sale M.D.   On: 01/29/2015 17:43    EKG:   Orders placed or performed during the hospital encounter of  12/03/14  . ED EKG  . ED EKG  . EKG    IMPRESSION AND PLAN:   #1 nonhealing diabetic ulcer with cellulitis: X-ray shows no osteomyelitis. Wound culture and blood culture have been collected and are pending. She was discharged from [facility] in August on Augmentin. She has been started on vancomycin, I will add Zosyn. We will consult podiatry. She has palpable pulses. Elevate legs to the level of the heart to decrease edema.   #2 diabetes mellitus type 2: Check hemoglobin A1c and continue sliding scale. Hold oral hypoglycemic agents during admission.  #3 hypertension: Blood pressure slightly elevated on admission at 150/51. Continue home blood pressure medications including Lasix, hydralazine, Isordil, ramipril, propranolol  #4 chronic kidney disease stage III: Stable.  #5 history of congestive heart failure: Diastolic, last echo in May 2016 shows preserved ejection fraction. She does have bilateral lower extremity edema. Continue Lasix,  low sodium diet, daily weights, elevate legs  #6 history of subdural hematoma: She is neurologically intact, generalized weakness. Continue physical therapy. Continue blood pressure control.   All the records are reviewed and case discussed with ED provider. Management plans discussed with the patient, family and they are in agreement.  CODE STATUS: Full. The patient is married her husband would be her power of attorney.  TOTAL TIME TAKING CARE OF THIS PATIENT: 50 minutes.  Greater than 50% of time spent in coordination of care and counseling. Care plan discussed with the patient and her sister-in-law at the bedside as well as with emergency room physician.  Myrtis Ser M.D on 01/29/2015 at 6:43 PM  Between 7am to 6pm - Pager - [phone]  After 6pm go to [email or web address] - password [email or web address] [place] [facility]  Office  [phone]  CC: Primary care physician; Morton Peters, MD

## 2015-01-29 NOTE — ED Notes (Signed)
Pt to ed with c/o right foot first toe,  Pt states was sent here from wound care clinic for ? Infection in toe. Pt states copious amounts of drainage from toe.

## 2015-01-29 NOTE — ED Notes (Signed)
Patient transported to Ultrasound 

## 2015-01-29 NOTE — Progress Notes (Signed)
ANTIBIOTIC CONSULT NOTE - INITIAL  Pharmacy Consult for vancomycin & zosyn Indication: diabetic foot ulcer   Allergies  Allergen Reactions  . Codeine Other (See Comments)    Reaction:  Hallucinations    Patient Measurements: Height: 5\' 7"  (170.2 cm) Weight: 145 lb (65.772 kg) IBW/kg (Calculated) : 61.6  Vital Signs: Temp: 98.2 F (36.8 C) (09/27 1537) Temp Source: Oral (09/27 1537) BP: 179/61 mmHg (09/27 1911) Pulse Rate: 67 (09/27 1911)  Labs:  Recent Labs  01/29/15 1606  WBC 3.3*  HGB 7.0*  PLT 251  CREATININE 1.38*   Estimated Creatinine Clearance: 37.4 mL/min (by C-G formula based on Cr of 1.38). No results for input(s): VANCOTROUGH, VANCOPEAK, VANCORANDOM, GENTTROUGH, GENTPEAK, GENTRANDOM, TOBRATROUGH, TOBRAPEAK, TOBRARND, AMIKACINPEAK, AMIKACINTROU, AMIKACIN in the last 72 hours.   Microbiology: No results found for this or any previous visit (from the past 720 hour(s)).  Medical History: Past Medical History  Diagnosis Date  . Hypertension   . Diabetes mellitus without complication   . Anemia   . Coronary artery disease   . CHF (congestive heart failure)   . Glaucoma   . Hyperlipidemia   . Chronic kidney disease   . Neuropathy   . Chronic anemia   . Stroke    Assessment: Pharmacy consulted to dose vancomycin & zosyn for diabetic foot ulcer in this 69 year old female. Per MD, no evidence of osteo on xray.   PK parameters: SCr: 1.38, CrCl: 28ml/min Ke: 0.035hr^-1, t1/2: 20h, Vd: 46kg  Goal of Therapy:  Vancomycin trough level 15-20 mcg/ml  Plan:  Vancomycin 1gm IV x 1 given in ED, will follow with vancomycin 1gm IV Q24H to start 12 hours after first dose for stacked dosing. Trough ordered prior to 4th dose, which should be at steady state.   Ordered zosyn 3.375gm IV Q8H extended infusion.  Pharmacy to follow per consult  Rexene Edison, PharmD Clinical Pharmacist  01/29/2015 8:18 PM

## 2015-01-30 DIAGNOSIS — Z87891 Personal history of nicotine dependence: Secondary | ICD-10-CM | POA: Diagnosis not present

## 2015-01-30 DIAGNOSIS — Z885 Allergy status to narcotic agent status: Secondary | ICD-10-CM | POA: Diagnosis not present

## 2015-01-30 DIAGNOSIS — B952 Enterococcus as the cause of diseases classified elsewhere: Secondary | ICD-10-CM | POA: Diagnosis present

## 2015-01-30 DIAGNOSIS — E1169 Type 2 diabetes mellitus with other specified complication: Secondary | ICD-10-CM | POA: Diagnosis present

## 2015-01-30 DIAGNOSIS — Z79891 Long term (current) use of opiate analgesic: Secondary | ICD-10-CM | POA: Diagnosis not present

## 2015-01-30 DIAGNOSIS — B9620 Unspecified Escherichia coli [E. coli] as the cause of diseases classified elsewhere: Secondary | ICD-10-CM | POA: Diagnosis present

## 2015-01-30 DIAGNOSIS — R531 Weakness: Secondary | ICD-10-CM | POA: Diagnosis present

## 2015-01-30 DIAGNOSIS — H409 Unspecified glaucoma: Secondary | ICD-10-CM | POA: Diagnosis present

## 2015-01-30 DIAGNOSIS — E785 Hyperlipidemia, unspecified: Secondary | ICD-10-CM | POA: Diagnosis present

## 2015-01-30 DIAGNOSIS — I2510 Atherosclerotic heart disease of native coronary artery without angina pectoris: Secondary | ICD-10-CM | POA: Diagnosis present

## 2015-01-30 DIAGNOSIS — G629 Polyneuropathy, unspecified: Secondary | ICD-10-CM | POA: Diagnosis present

## 2015-01-30 DIAGNOSIS — I130 Hypertensive heart and chronic kidney disease with heart failure and stage 1 through stage 4 chronic kidney disease, or unspecified chronic kidney disease: Secondary | ICD-10-CM | POA: Diagnosis present

## 2015-01-30 DIAGNOSIS — Z794 Long term (current) use of insulin: Secondary | ICD-10-CM | POA: Diagnosis not present

## 2015-01-30 DIAGNOSIS — Z8673 Personal history of transient ischemic attack (TIA), and cerebral infarction without residual deficits: Secondary | ICD-10-CM | POA: Diagnosis not present

## 2015-01-30 DIAGNOSIS — D649 Anemia, unspecified: Secondary | ICD-10-CM | POA: Diagnosis present

## 2015-01-30 DIAGNOSIS — I5032 Chronic diastolic (congestive) heart failure: Secondary | ICD-10-CM | POA: Diagnosis present

## 2015-01-30 DIAGNOSIS — Z789 Other specified health status: Secondary | ICD-10-CM | POA: Diagnosis not present

## 2015-01-30 DIAGNOSIS — D638 Anemia in other chronic diseases classified elsewhere: Secondary | ICD-10-CM | POA: Diagnosis present

## 2015-01-30 DIAGNOSIS — E1122 Type 2 diabetes mellitus with diabetic chronic kidney disease: Secondary | ICD-10-CM | POA: Diagnosis present

## 2015-01-30 DIAGNOSIS — N183 Chronic kidney disease, stage 3 (moderate): Secondary | ICD-10-CM | POA: Diagnosis present

## 2015-01-30 DIAGNOSIS — Z79899 Other long term (current) drug therapy: Secondary | ICD-10-CM | POA: Diagnosis not present

## 2015-01-30 DIAGNOSIS — L97519 Non-pressure chronic ulcer of other part of right foot with unspecified severity: Secondary | ICD-10-CM | POA: Diagnosis present

## 2015-01-30 DIAGNOSIS — E11621 Type 2 diabetes mellitus with foot ulcer: Secondary | ICD-10-CM | POA: Diagnosis present

## 2015-01-30 DIAGNOSIS — L03031 Cellulitis of right toe: Secondary | ICD-10-CM | POA: Diagnosis present

## 2015-01-30 LAB — CBC
HEMATOCRIT: 22.2 % — AB (ref 35.0–47.0)
HEMOGLOBIN: 7 g/dL — AB (ref 12.0–16.0)
MCH: 27.7 pg (ref 26.0–34.0)
MCHC: 31.3 g/dL — ABNORMAL LOW (ref 32.0–36.0)
MCV: 88.3 fL (ref 80.0–100.0)
Platelets: 251 10*3/uL (ref 150–440)
RBC: 2.52 MIL/uL — ABNORMAL LOW (ref 3.80–5.20)
RDW: 21.2 % — AB (ref 11.5–14.5)
WBC: 3.5 10*3/uL — AB (ref 3.6–11.0)

## 2015-01-30 LAB — GLUCOSE, CAPILLARY
GLUCOSE-CAPILLARY: 135 mg/dL — AB (ref 65–99)
GLUCOSE-CAPILLARY: 98 mg/dL (ref 65–99)
GLUCOSE-CAPILLARY: 136 mg/dL — AB (ref 65–99)
Glucose-Capillary: 98 mg/dL (ref 65–99)

## 2015-01-30 LAB — BASIC METABOLIC PANEL
ANION GAP: 6 (ref 5–15)
BUN: 36 mg/dL — ABNORMAL HIGH (ref 6–20)
CALCIUM: 7.7 mg/dL — AB (ref 8.9–10.3)
CO2: 22 mmol/L (ref 22–32)
CREATININE: 1.33 mg/dL — AB (ref 0.44–1.00)
Chloride: 112 mmol/L — ABNORMAL HIGH (ref 101–111)
GFR calc non Af Amer: 40 mL/min — ABNORMAL LOW (ref >60)
GFR, EST AFRICAN AMERICAN: 46 mL/min — AB (ref >60)
Glucose, Bld: 132 mg/dL — ABNORMAL HIGH (ref 65–99)
Potassium: 3.8 mmol/L (ref 3.5–5.1)
SODIUM: 140 mmol/L (ref 135–145)

## 2015-01-30 LAB — HEMOGLOBIN A1C: HEMOGLOBIN A1C: 5.9 % (ref 4.0–6.0)

## 2015-01-30 LAB — PREPARE RBC (CROSSMATCH)

## 2015-01-30 MED ORDER — SODIUM CHLORIDE 0.9 % IV SOLN
Freq: Once | INTRAVENOUS | Status: AC
  Administered 2015-01-30: 23:00:00 via INTRAVENOUS

## 2015-01-30 MED ORDER — ACETAMINOPHEN 325 MG PO TABS
650.0000 mg | ORAL_TABLET | ORAL | Status: AC
  Administered 2015-01-30: 650 mg via ORAL

## 2015-01-30 MED ORDER — MENTHOL 3 MG MT LOZG
1.0000 | LOZENGE | OROMUCOSAL | Status: DC | PRN
  Filled 2015-01-30: qty 9

## 2015-01-30 NOTE — Clinical Social Work Note (Signed)
Clinical Social Work Assessment  Patient Details  Name: [name] MRN: [id number] Date of Birth: [birth year]  Date of referral:  01/30/15               Reason for consult:  Other (Comment Required) (No reason was listed for consult. )                Permission sought to share information with:    Permission granted to share information::  No  Name::        Agency::     Relationship::     Contact Information:     Housing/Transportation Living arrangements for the past 2 months:  Single Family Home Source of Information:  Patient Patient Interpreter Needed:  None Criminal Activity/Legal Involvement Pertinent to Current Situation/Hospitalization:  No - Comment as needed Significant Relationships:  Adult Children, Spouse Lives with:  Spouse Do you feel safe going back to the place where you live?  Yes Need for family participation in patient care:  No (Coment)  Care giving concerns:  Patient lives in [place] with [name].    Social Worker assessment / plan:  Holiday representative (CSW) received a consult with no reason listed. Per RN in progression there are no CSW needs identified at this time. CSW met with patient to complete assessment. Patient was alert and oriented and sitting up in the bed. Patient reported that she lives in [place] with [name]. Per patient she has 1 adult son [name] and 2 grandchildren. Patient spoke fondly of her granddaughter who graduated from [employer] and went out of state for graduate school. Patient reported that her grandson still lives in the area and is trying to figure out his career goals. Per patient her husband has 4 or 5 other children that are not hers. Patient reported that she is retired from [employer] and worked in [employer]. Patient's husband is retired from [employer] a [employer]. Per patient their social security is the only income. Patient reported that they rent a house and have  enough food. Patient reported that she has trouble paying her light bill sometimes. Patient reported that they have a working car for transportation and have enough money for mediations. Patient denied other needs. CSW provided patient with community resources including the 211 program.   Patient reported that she is hungry and thirsty and was told she can't eat or drink anything until the podiatry doctor comes. Patient requested food and water. CSW made RN aware of patient's request. Please reconsult if future social work needs arise. CSW signing off.      Employment status:  Retired (Retired from [employer]) Insurance information:  Managed Medicare PT Recommendations:  Not assessed at this time [place] / Referral to community resources:  Other (Comment Required) [facility] )  Patient/Family's Response to care:  Patient is agreeable to going home.    Patient/Family's Understanding of and Emotional Response to Diagnosis, Current Treatment, and Prognosis: Patient was pleasant throughout assessment and thanked CSW for visit.   Emotional Assessment Appearance:  Appears stated age Attitude/Demeanor/Rapport:    Affect (typically observed):  Accepting, Adaptable, Pleasant Orientation:  Oriented to Self, Oriented to Place, Oriented to  Time, Oriented to Situation Alcohol / Substance use:  Not Applicable Psych involvement (Current and /or in the community):  No (Comment)  Discharge Needs  Concerns to be addressed:  No discharge needs identified Readmission within the last 30 days:  No Current discharge risk:  None Barriers to Discharge:  Continued Medical Work up   Elwyn Reach 01/30/2015, 10:44 AM

## 2015-01-30 NOTE — Progress Notes (Signed)
Pt alert and oriented.  Denies pain. Guaze dressing applied to right foot. Ulcer between right great toe and 2nd toe bled when pt ambulated. IV antibiotic administered. Pt NPO after midnight

## 2015-01-30 NOTE — Progress Notes (Signed)
Notified Dr Ether Griffins of critical blood culture results. Gram positive cocci in aerobic bottle

## 2015-01-30 NOTE — Progress Notes (Addendum)
LOVELLA, HARDIE ([id number]) Visit Report for 01/29/2015 Arrival Information Details Patient Name: [name], PAYMENT. Date of Service: 01/29/2015 2:15 PM Medical Record Number: [id number] Patient Account Number: [id number] Date of Birth/Sex: 01/25/1946 (69 y.o. Female) Treating RN: Montey Hora Primary Care Physician: Reed Breech Other Clinician: Referring Physician: Reed Breech Treating Physician/Extender: Frann Rider in Treatment: 2 Visit Information History Since Last Visit Added or deleted any medications: No Patient Arrived: [place] Any new allergies or adverse reactions: No Arrival Time: 14:49 Had a fall or experienced change in No Accompanied By: friend activities of daily living that may affect Transfer Assistance: None risk of falls: Patient Identification Verified: Yes Signs or symptoms of abuse/neglect since last No Secondary Verification Process Yes visito Completed: Hospitalized since last visit: No Patient Has Alerts: Yes Has Dressing in Place as Prescribed: Yes Patient Alerts: Type II Pain Present Now: No Diabetic Electronic Signature(s) Signed: 01/29/2015 4:21:27 PM By: Montey Hora Entered By: Montey Hora on 01/29/2015 14:49:22 Cardy, [name] ([id number]) -------------------------------------------------------------------------------- Clinic Level of Care Assessment Details Patient Name: [name] Date of Service: 01/29/2015 2:15 PM Medical Record Number: [id number] Patient Account Number: [id number] Date of Birth/Sex: July 07, 1945 (69 y.o. Female) Treating RN: Montey Hora Primary Care Physician: Reed Breech Other Clinician: Referring Physician: Reed Breech Treating Physician/Extender: Frann Rider in Treatment: 2 Clinic Level of Care Assessment Items TOOL 4 Quantity Score []  - Use when only an EandM is performed on FOLLOW-UP visit 0 ASSESSMENTS - Nursing Assessment / Reassessment X -  Reassessment of Co-morbidities (includes updates in patient status) 1 10 X - Reassessment of Adherence to Treatment Plan 1 5 ASSESSMENTS - Wound and Skin Assessment / Reassessment X - Simple Wound Assessment / Reassessment - one wound 1 5 []  - Complex Wound Assessment / Reassessment - multiple wounds 0 []  - Dermatologic / Skin Assessment (not related to wound area) 0 ASSESSMENTS - Focused Assessment []  - Circumferential Edema Measurements - multi extremities 0 []  - Nutritional Assessment / Counseling / Intervention 0 []  - Lower Extremity Assessment (monofilament, tuning fork, pulses) 0 []  - Peripheral Arterial Disease Assessment (using hand held doppler) 0 ASSESSMENTS - Ostomy and/or Continence Assessment and Care []  - Incontinence Assessment and Management 0 []  - Ostomy Care Assessment and Management (repouching, etc.) 0 PROCESS - Coordination of Care X - Simple Patient / Family Education for ongoing care 1 15 []  - Complex (extensive) Patient / Family Education for ongoing care 0 X - Staff obtains Programmer, systems, Records, Test Results / Process Orders 1 10 []  - Staff telephones HHA, Nursing Homes / Clarify orders / etc 0 []  - Routine Transfer to another Facility (non-emergent condition) 0 Mckibbin, Gearl C. ([id number]) []  - Routine Hospital Admission (non-emergent condition) 0 []  - New Admissions / Biomedical engineer / Ordering NPWT, Apligraf, etc. 0 X - Emergency Hospital Admission (emergent condition) 1 20 X - Simple Discharge Coordination 1 10 []  - Complex (extensive) Discharge Coordination 0 PROCESS - Special Needs []  - Pediatric / Minor Patient Management 0 []  - Isolation Patient Management 0 []  - Hearing / Language / Visual special needs 0 []  - Assessment of Community assistance (transportation, D/C planning, etc.) 0 []  - Additional assistance / Altered mentation 0 []  - Support Surface(s) Assessment (bed, cushion, seat, etc.) 0 INTERVENTIONS - Wound Cleansing / Measurement X -  Simple Wound Cleansing - one wound 1 5 []  - Complex Wound Cleansing - multiple wounds 0 X - Wound Imaging (photographs - any number of  wounds) 1 5 []  - Wound Tracing (instead of photographs) 0 X - Simple Wound Measurement - one wound 1 5 []  - Complex Wound Measurement - multiple wounds 0 INTERVENTIONS - Wound Dressings X - Small Wound Dressing one or multiple wounds 1 10 []  - Medium Wound Dressing one or multiple wounds 0 []  - Large Wound Dressing one or multiple wounds 0 []  - Application of Medications - topical 0 []  - Application of Medications - injection 0 INTERVENTIONS - Miscellaneous []  - External ear exam 0 Schopf, Mikenzi C. ([id number]) []  - Specimen Collection (cultures, biopsies, blood, body fluids, etc.) 0 []  - Specimen(s) / Culture(s) sent or taken to Lab for analysis 0 []  - Patient Transfer (multiple staff / Harrel Lemon Lift / Similar devices) 0 []  - Simple Staple / Suture removal (25 or less) 0 []  - Complex Staple / Suture removal (26 or more) 0 []  - Hypo / Hyperglycemic Management (close monitor of Blood Glucose) 0 []  - Ankle / Brachial Index (ABI) - do not check if billed separately 0 []  - Vital Signs 0 Has the patient been seen at the hospital within the last three years: Yes Total Score: 100 Level Of Care: New/Established - Level 3 Electronic Signature(s) Signed: 01/29/2015 4:21:27 PM By: Montey Hora Entered By: Montey Hora on 01/29/2015 15:08:37 [name] ([id number]) -------------------------------------------------------------------------------- Encounter Discharge Information Details Patient Name: [name] Date of Service: 01/29/2015 2:15 PM Medical Record Number: [id number] Patient Account Number: [id number] Date of Birth/Sex: Nov 25, 1945 (69 y.o. Female) Treating RN: Cornell Barman Primary Care Physician: Reed Breech Other Clinician: Referring Physician: Reed Breech Treating Physician/Extender: Frann Rider in Treatment:  2 Encounter Discharge Information Items Discharge Pain Level: 0 Discharge Condition: Stable Ambulatory Status: [place] Emergency Discharge Destination: Room Transportation: Private Auto Accompanied By: sister in law Schedule Follow-up Appointment: Yes Medication Reconciliation completed Yes and provided to Patient/Care Provider: Clinical Summary of Care: Electronic Signature(s) Signed: 01/29/2015 5:26:34 PM By: Gretta Cool, RN, BSN, Kim RN, BSN Entered By: Gretta Cool, RN, BSN, Kim on 01/29/2015 15:14:00 [name] ([id number]) -------------------------------------------------------------------------------- Lower Extremity Assessment Details Patient Name: MERCADES, BAJAJ. Date of Service: 01/29/2015 2:15 PM Medical Record Number: [id number] Patient Account Number: [id number] Date of Birth/Sex: 29-Apr-1946 (69 y.o. Female) Treating RN: Montey Hora Primary Care Physician: Reed Breech Other Clinician: Referring Physician: Reed Breech Treating Physician/Extender: Frann Rider in Treatment: 2 Vascular Assessment Pulses: Posterior Tibial Dorsalis Pedis Palpable: [Right:Yes] Extremity colors, hair growth, and conditions: Extremity Color: [Right:Normal] Hair Growth on Extremity: [Right:No] Temperature of Extremity: [Right:Warm] Capillary Refill: [Right:< 3 seconds] Toe Nail Assessment Left: Right: Thick: Yes Discolored: Yes Deformed: Yes Improper Length and Hygiene: Yes Electronic Signature(s) Signed: 01/29/2015 4:21:27 PM By: Montey Hora Entered By: Montey Hora on 01/29/2015 14:53:48 Vondra, [name] ([id number]) -------------------------------------------------------------------------------- Multi Wound Chart Details Patient Name: [name] Date of Service: 01/29/2015 2:15 PM Medical Record Number: [id number] Patient Account Number: [id number] Date of Birth/Sex: 1945/09/29 (69 y.o. 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] [3:the Lower Extremity] [N/A:N/A] Comorbid History: [1:Cataracts, Anemia,  Hypertension, Type II Diabetes, Osteoarthritis Diabetes, Osteoarthritis] [3:Cataracts, Anemia, Hypertension, Type II] [N/A:N/A] Date Acquired: [1:12/03/2014] [3:01/28/2015] [N/A:N/A] Weeks of Treatment: [1:2] [3:0] [N/A:N/A] Wound Status: [1:Open] [3:Open] [N/A:N/A] Measurements L x W x D 3.5x4x0.5 [3:0.2x0.2x0.5] [N/A:N/A] (cm) Area (cm) : [1:10.996] [3:0.031] [N/A:N/A] Volume (cm) : [1:5.498] [3:0.016] [N/A:N/A] % Reduction in Area: [1:-599.90%] [3:0.00%] [N/A:N/A] % Reduction in Volume: -775.50% [3:0.00%] [N/A:N/A] Position 1 (o'clock): [3:5] Maximum Distance 1 [3:2] (cm): Tunneling: [1:No] [3:Yes] [N/A:N/A] Classification: [1:Grade 2] [3:Grade 2] [N/A:N/A] Exudate Amount: [1:Small] [3:Large] [N/A:N/A] Exudate Type: [1:Serous] [3:Purulent] [N/A:N/A] Exudate Color: [1:amber] [3:yellow, brown, Charmane Protzman] [N/A:N/A] Foul Odor After [1:Yes] [3:N/A] [N/A:N/A] Cleansing: Odor Anticipated Due to No [3:N/A] [N/A:N/A] Product Use: Wound Margin: [1:Indistinct, nonvisible] [3:Flat and Intact] [N/A:N/A] Granulation Amount: Large (67-100%) None Present (0%) N/A Granulation Quality: Red N/A N/A Necrotic Amount: Small (1-33%) None Present (0%) N/A Exposed Structures: Fascia: No Fascia: No N/A Fat: No Fat:  No Tendon: No Tendon: No Muscle: No Muscle: No Joint: No Joint: No Bone: No Bone: No Limited to Skin Limited to Skin Breakdown Breakdown Periwound Skin Texture: Edema: No Edema: No N/A Excoriation: No Excoriation: No Induration: No Induration: No Callus: No Callus: No Crepitus: No Crepitus: No Fluctuance: No Fluctuance: No Friable: No Friable: No Rash: No Rash: No Scarring: No Scarring: No Periwound Skin Maceration: Yes Maceration: No N/A Moisture: Moist: Yes Moist: No Dry/Scaly: No Dry/Scaly: No Periwound Skin Color: Ecchymosis: Yes Atrophie Blanche: No N/A Atrophie Blanche: No Cyanosis: No Cyanosis: No Ecchymosis: No Erythema: No Erythema: No Hemosiderin Staining: No Hemosiderin Staining: No Mottled: No Mottled: No Pallor: No Pallor: No Rubor: No Rubor: No Tenderness on No No N/A Palpation: Wound Preparation: Ulcer Cleansing: Ulcer Cleansing: N/A Rinsed/Irrigated with Rinsed/Irrigated with Saline Saline Topical Anesthetic Topical Anesthetic Applied: Other: lidociane Applied: Other: 4% lidocaine4% Treatment Notes Electronic Signature(s) Signed: 01/29/2015 4:21:27 PM By: Montey Hora Entered By: Montey Hora on 01/29/2015 15:04:40 [name] ([id number]) -------------------------------------------------------------------------------- Multi-Disciplinary Care Plan Details Patient Name: [name] Date of Service: 01/29/2015 2:15 PM Medical Record Number: [id number] Patient Account Number: [id number] Date of Birth/Sex: 13-Feb-1946 (69 y.o. Female) Treating RN: Montey Hora Primary Care Physician: Reed Breech Other Clinician: Referring Physician: Reed Breech Treating Physician/Extender: Frann Rider in Treatment: 2 Active Inactive Electronic Signature(s) Signed: 02/22/2015 3:34:06 PM By: Gretta Cool RN, BSN, Kim RN, BSN Signed: 02/26/2015 5:44:39 PM By: Montey Hora Previous Signature: 01/29/2015 4:21:27  PM Version By: Montey Hora Entered By: Gretta Cool RN, BSN, Kim on 02/22/2015 09:55:05 [name] ([id number]) -------------------------------------------------------------------------------- Pain Assessment Details Patient Name: ALEENA, KIRKEBY. Date of Service: 01/29/2015 2:15 PM Medical Record Number: [id number] Patient Account Number: [id number] Date of Birth/Sex: [birth year] (69 y.o. Female) Treating RN: Montey Hora Primary Care Physician: Reed Breech Other Clinician: Referring Physician: Reed Breech Treating Physician/Extender: Frann Rider in Treatment: 2 Active Problems Location of Pain Severity and Description of Pain Patient Has Paino No Site Locations Pain Management and Medication Current Pain Management: Electronic Signature(s) Signed: 01/29/2015 4:21:27 PM By: Montey Hora Entered By: Montey Hora on 01/29/2015 14:52:27 [name] ([id number]) -------------------------------------------------------------------------------- Patient/Caregiver Education Details Patient Name: [name] Date of Service: 01/29/2015 2:15 PM Medical Record Number: [id number] Patient Account Number: [id number] Date of Birth/Gender: [birth year] (69 y.o. Female) Treating RN: Cornell Barman Primary Care Physician: Reed Breech Other Clinician: Referring Physician: Reed Breech Treating Physician/Extender: Frann Rider in Treatment: 2 Education Assessment Education Provided To: Patient Education Topics Provided Wound/Skin Impairment: Handouts: Caring for Your Ulcer,  Other: Go straight to ER for admission Electronic Signature(s) Signed: 01/29/2015 5:26:34 PM By: Gretta Cool, RN, BSN, Kim RN, BSN Entered By: Gretta Cool, RN, BSN, Kim on 01/29/2015 15:14:21 [name] ([id number]) -------------------------------------------------------------------------------- Wound Assessment Details Patient Name: RAMIYA, DELAHUNTY. Date of  Service: 01/29/2015 2:15 PM Medical Record Number: [id number] Patient Account Number: [id number] Date of Birth/Sex: Sep 20, 1945 (69 y.o. Female) Treating RN: Montey Hora Primary Care Physician: Reed Breech Other Clinician: Referring Physician: Reed Breech Treating Physician/Extender: Frann Rider in Treatment: 2 Wound Status Wound Number: 1 Primary Diabetic Wound/Ulcer of the Lower Etiology: Extremity Wound Location: Right Metatarsal head first Wound Open Wounding Event: Gradually Appeared Status: Date Acquired: 12/03/2014 Comorbid Cataracts, Anemia, Hypertension, Weeks Of Treatment: 2 History: Type II Diabetes, Osteoarthritis Clustered Wound: No Photos Photo Uploaded By: Gretta Cool, RN, BSN, Kim on 01/29/2015 17:01:59 Wound Measurements Length: (cm) 3.5 Width: (cm) 4 Depth: (cm) 0.5 Area: (cm) 10.996 Volume: (cm) 5.498 % Reduction in Area: -599.9% % Reduction in Volume: -775.5% Tunneling: No Wound Description Classification: Grade 2 Wound Margin: Indistinct, nonvisible Exudate Amount: Small Exudate Type: Serous Exudate Color: amber Foul Odor After Cleansing: Yes Due to Product Use: No Wound Bed Granulation Amount: Large (67-100%) Exposed Structure Granulation Quality: Red Fascia Exposed: No Necrotic Amount: Small (1-33%) Fat Layer Exposed: No Necrotic Quality: Adherent Slough Tendon Exposed: No Caine, Mykelle C. ([id number]) Muscle Exposed: No Joint Exposed: No Bone Exposed: No Limited to Skin Breakdown Periwound Skin Texture Texture Color No Abnormalities Noted: No No Abnormalities Noted: No Callus: No Atrophie Blanche: No Crepitus: No Cyanosis: No Excoriation: No Ecchymosis: Yes Fluctuance: No Erythema: No Friable: No Hemosiderin Staining: No Induration: No Mottled: No Localized Edema: No Pallor: No Rash: No Rubor: No Scarring: No Moisture No Abnormalities Noted: No Dry / Scaly: No Maceration: Yes Moist: Yes Wound  Preparation Ulcer Cleansing: Rinsed/Irrigated with Saline Topical Anesthetic Applied: Other: lidociane 4%, Electronic Signature(s) Signed: 01/29/2015 4:21:27 PM By: Montey Hora Entered By: Montey Hora on 01/29/2015 14:58:55 [name] ([id number]) -------------------------------------------------------------------------------- Wound Assessment Details Patient Name: [name]. Date of Service: 01/29/2015 2:15 PM Medical Record Number: [id number] Patient Account Number: [id number] Date of Birth/Sex: Aug 20, 1945 (69 y.o. Female) Treating RN: Montey Hora Primary Care Physician: Reed Breech Other Clinician: Referring Physician: Reed Breech Treating Physician/Extender: Frann Rider in Treatment: 2 Wound Status Wound Number: 3 Primary Diabetic Wound/Ulcer of the Lower Etiology: Extremity Wound Location: Right Toe Great - Plantar Wound Open Wounding Event: Gradually Appeared Status: Date Acquired: 01/28/2015 Comorbid Cataracts, Anemia, Hypertension, Weeks Of Treatment: 0 History: Type II Diabetes, Osteoarthritis Clustered Wound: No Photos Photo Uploaded By: Gretta Cool, RN, BSN, Kim on 01/29/2015 17:02:00 Wound Measurements Length: (cm) 0.2 % Reduction in Width: (cm) 0.2 % Reduction in Depth: (cm) 0.5 Tunneling: Area: (cm) 0.031 Position (o Volume: (cm) 0.016 Maximum Dis Area: 0% Volume: 0% Yes 'clock): 5 tance: (cm) 2 Wound Description Classification: Grade 2 Wound Margin: Flat and Intact Exudate Amount: Large Exudate Type: Purulent Exudate Color: yellow, brown, Tiearra Colwell Wound Bed Granulation Amount: None Present (0%) Exposed Structure Necrotic Amount: None Present (0%) Fascia Exposed: No Fat Layer Exposed: No Zamarron, Lianah C. ([id number]) Tendon Exposed: No Muscle Exposed: No Joint Exposed: No Bone Exposed: No Limited to Skin Breakdown Periwound Skin Texture Texture Color No Abnormalities Noted: No No Abnormalities  Noted: No Callus: No Atrophie Blanche: No Crepitus: No Cyanosis: No Excoriation: No Ecchymosis: No Fluctuance: No Erythema: No Friable: No Hemosiderin Staining: No Induration: No Mottled: No Localized Edema: No  Pallor: No Rash: No Rubor: No Scarring: No Moisture No Abnormalities Noted: No Dry / Scaly: No Maceration: No Moist: No Wound Preparation Ulcer Cleansing: Rinsed/Irrigated with Saline Topical Anesthetic Applied: Other: lidocaine4%, Electronic Signature(s) Signed: 01/29/2015 4:21:27 PM By: Montey Hora Entered By: Montey Hora on 01/29/2015 15:01:25 [name] ([id number]) -------------------------------------------------------------------------------- Vitals Details Patient Name: [name] Date of Service: 01/29/2015 2:15 PM Medical Record Number: [id number] Patient Account Number: [id number] Date of Birth/Sex: 1946-01-23 (69 y.o. Female) Treating RN: Montey Hora Primary Care Physician: Reed Breech Other Clinician: Referring Physician: Reed Breech Treating Physician/Extender: Frann Rider in Treatment: 2 Vital Signs Time Taken: 04:52 Temperature (F): 98.2 Weight (lbs): 145 Pulse (bpm): 69 Respiratory Rate (breaths/min): 18 Blood Pressure (mmHg): 136/42 Reference Range: 80 - 120 mg / dl Electronic Signature(s) Signed: 01/29/2015 4:21:27 PM By: Montey Hora Entered By: Montey Hora on 01/29/2015 14:52:53

## 2015-01-30 NOTE — Progress Notes (Signed)
Pt temp elevated 101.1 orally ,  Blood ready spoke with Shelly in blood bank about giving blood to pt with temp, Shelly said to hold blood premed pt . For temp, Dr Bridgett Larsson paged

## 2015-01-30 NOTE — Progress Notes (Signed)
   01/30/15 1415  Clinical Encounter Type  Visited With Patient  Visit Type Initial  Consult/Referral To Chaplain  Stress Factors  Patient Stress Factors Health changes  Chaplain rounded in unit and offered pastoral care to patient.   [employer] [phone]

## 2015-01-30 NOTE — Progress Notes (Signed)
Dr Bridgett Larsson notified of temp 100.7;  Orders received for repeat 650 mg tylenol po

## 2015-01-30 NOTE — Consult Note (Signed)
ORTHOPAEDIC CONSULTATION  REQUESTING PHYSICIAN: Demetrios Loll, MD  Chief Complaint: Right great toe ulceration  HPI: [name] is a 69 y.o. 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 She reports that she has been compliant with appointments at the wound care clinic and has had multiple debridements of the great toe. The wound has not healed very well and has now developed erythema over the entire great toe and second toe. She has bilateral lower extremity dependent edema which is chronic. She denies nausea vomiting diarrhea fevers or chills. Appetite has been intact. She has no other complaints at this time.   States she's been going to the wound care center for routine debridements recently. Her home health nurse noticed foul odor and discoloration to the toe and ups equally she has been referred from the wound center to the ER and admitted with worsening cellulitis and infection.  Past Medical History  Diagnosis Date  . Hypertension   . Diabetes mellitus without complication   . Anemia   . Coronary artery disease   . CHF (congestive heart failure)   . Glaucoma   . Hyperlipidemia   . Chronic kidney disease   . Neuropathy   . Chronic anemia   . Stroke    Past Surgical History  Procedure Laterality Date  . Cardiac catheterization    . Esophagogastroduodenoscopy (egd) with propofol N/A 09/24/2014    Elliott-gastritis & normal Billroth II changes   . Colonoscopy with propofol N/A 09/24/2014    Elliott-Incomplete secondary to prep  . Coronary angioplasty  2016    stent placed  . Bilroth ii procedure    . Colonoscopy with propofol N/A 11/12/2014    Procedure: COLONOSCOPY WITH PROPOFOL;   Surgeon: Manya Silvas, MD;  Location: [facility] ENDOSCOPY;  Service: Endoscopy;  Laterality: N/A;  Trudee Kuster hole Right 12/06/2014    Procedure: Right burr hole  for subdural hematoma;  Surgeon: Newman Pies, MD;  Location: [facility] NEURO ORS;  Service: Neurosurgery;  Laterality: Right;   Social History   Social History  . Marital Status: Married    Spouse Name: N/A  . Number of Children: 2  . Years of Education: N/A   Occupational History  . retired Actuary     Social History Main Topics  . Smoking status: Former Research scientist (life sciences)  . Smokeless tobacco: Never Used     Comment: quit many years ago  . Alcohol Use: No  . Drug Use: No  . Sexual Activity: Yes    Birth Control/ Protection: Post-menopausal   Other Topics Concern  . None   Social History Narrative   Lives with her husband   Family History  Problem Relation Age of Onset  . Diabetes Mother   . Heart disease Father   . Diabetes Sister   . Lung cancer Brother   . Cancer Brother   . Diabetes Brother   . Diabetes Sister    Allergies  Allergen Reactions  . Codeine Other (See Comments)    Reaction:  Hallucinations   Prior to Admission medications   Medication Sig Start Date End Date Taking? Authorizing Provider  acetaminophen (TYLENOL) 325 MG tablet Take 2 tablets (650 mg total) by mouth every 4 (four) hours  as needed for mild pain (or temp > 99 F). 12/13/14  Yes Delfina Redwood, MD  amoxicillin-clavulanate (AUGMENTIN) 875-125 MG tablet Take 1 tablet by mouth 2 (two) times daily. 01/25/15 02/04/15 Yes Historical Provider, MD  docusate sodium (COLACE) 100 MG capsule Take 100 mg by mouth 2 (two) times daily.   Yes Historical Provider, MD  feeding supplement, GLUCERNA SHAKE, (GLUCERNA SHAKE) LIQD Take 237 mLs by mouth 3 (three) times daily between meals. 12/13/14  Yes Delfina Redwood, MD  ferrous sulfate 325 (65 FE) MG tablet Take 325 mg by mouth daily.   Yes Historical Provider, MD  furosemide (LASIX) 40 MG tablet Take 1  tablet (40 mg total) by mouth 2 (two) times daily. 11/02/14  Yes Alisa Graff, FNP  glimepiride (AMARYL) 2 MG tablet Take 1 mg by mouth daily.   Yes Historical Provider, MD  hydrALAZINE (APRESOLINE) 100 MG tablet Take 100 mg by mouth 3 (three) times daily.    Yes Historical Provider, MD  insulin aspart (NOVOLOG) 100 UNIT/ML injection Inject 0-9 Units into the skin 3 (three) times daily with meals. Patient taking differently: Inject 0-9 Units into the skin 3 (three) times daily with meals. Pt uses based off of a sliding scale. 12/13/14  Yes Delfina Redwood, MD  isosorbide dinitrate (ISORDIL) 30 MG tablet Take 30 mg by mouth 2 (two) times daily.   Yes Historical Provider, MD  nitroGLYCERIN (NITROSTAT) 0.4 MG SL tablet Place 0.4 mg under the tongue every 5 (five) minutes as needed for chest pain.   Yes Historical Provider, MD  Omega-3 Fatty Acids (FISH OIL) 1000 MG CAPS Take 1,000 mg by mouth daily.    Yes Historical Provider, MD  omeprazole (PRILOSEC) 20 MG capsule Take 20 mg by mouth daily.   Yes Historical Provider, MD  potassium chloride SA (K-DUR,KLOR-CON) 20 MEQ tablet Take 1 tablet (20 mEq total) by mouth daily. 11/02/14  Yes Alisa Graff, FNP  propranolol (INDERAL) 20 MG tablet Take 40 mg by mouth 3 (three) times daily.   Yes Historical Provider, MD  ramipril (ALTACE) 5 MG capsule Take 5 mg by mouth daily.   Yes Historical Provider, MD  simvastatin (ZOCOR) 20 MG tablet Take 20 mg by mouth at bedtime.   Yes Historical Provider, MD  traMADol (ULTRAM) 50 MG tablet Take 0.5-1 tablets (25-50 mg total) by mouth 3 (three) times daily as needed for moderate pain. 12/13/14  Yes Delfina Redwood, MD  feeding supplement, GLUCERNA SHAKE, (GLUCERNA SHAKE) LIQD Take 237 mLs by mouth 2 (two) times daily between meals. Patient not taking: Reported on 01/29/2015 09/20/14   Loletha Grayer, MD   US Venous Img Lower Unilateral Right  01/29/2015   CLINICAL DATA:  Right lower extremity swelling. Swelling for less  than 1 month.  EXAM: RIGHT LOWER EXTREMITY VENOUS DOPPLER ULTRASOUND  TECHNIQUE: Gray-scale sonography with graded compression, as well as color Doppler and duplex ultrasound were performed to evaluate the lower extremity deep venous systems from the level of the common femoral vein and including the common femoral, femoral, profunda femoral, popliteal and calf veins including the posterior tibial, peroneal and gastrocnemius veins when visible. The superficial great saphenous vein was also interrogated. Spectral Doppler was utilized to evaluate flow at rest and with distal augmentation maneuvers in the common femoral, femoral and popliteal veins.  COMPARISON:  Lower extremity venous Doppler 12/01/2014  FINDINGS: Contralateral Common Femoral Vein: Respiratory phasicity is normal and symmetric with the symptomatic side. No evidence of  thrombus. Normal compressibility.  Common Femoral Vein: No evidence of thrombus. Normal compressibility, respiratory phasicity and response to augmentation.  Saphenofemoral Junction: No evidence of thrombus. Normal compressibility and flow on color Doppler imaging.  Profunda Femoral Vein: No evidence of thrombus. Normal compressibility and flow on color Doppler imaging.  Femoral Vein: No evidence of thrombus. Normal compressibility, respiratory phasicity and response to augmentation.  Popliteal Vein: No evidence of thrombus. Normal compressibility, respiratory phasicity and response to augmentation.  Calf Veins: No evidence of thrombus. Normal compressibility and flow on color Doppler imaging.  Superficial Great Saphenous Vein: No evidence of thrombus. Normal compressibility and flow on color Doppler imaging.  Venous Reflux:  None.  Other Findings: Small avascular fluid collection in the popliteal fossa measures 4.1 x 0.8 x 2.9 cm, not significantly changed from prior. Prominent lymph nodes are noted in the right groin, retaining normal reniform morphology.  IMPRESSION: No evidence of  right lower extremity deep venous thrombosis.   Electronically Signed   By: Jeb Levering M.D.   On: 01/29/2015 19:21   Dg Toe Great Right  01/29/2015   CLINICAL DATA:  Right great toe pain with open wound. History of diabetes and chronic kidney disease. Subsequent encounter.  EXAM: RIGHT GREAT TOE  COMPARISON:  01/11/2015 radiographs.  FINDINGS: Soft tissue swelling and irregularity again noted. No definite residual soft tissue emphysema identified on the current study. There is no evidence of acute bone destruction, fracture or dislocation. First metatarsal phalangeal degenerative changes and diffuse vascular calcifications are again noted.  IMPRESSION: No radiographic evidence of osteomyelitis. Persistent soft tissue swelling without definite residual emphysema.   Electronically Signed   By: Richardean Sale M.D.   On: 01/29/2015 17:43    Positive ROS: All other systems have been reviewed and were otherwise negative with the exception of those mentioned in the HPI and as above.  12 point ROS was performed.  Physical Exam: General: Alert and oriented.  No apparent distress.  Vascular:  Left foot:Dorsalis Pedis:  present Posterior Tibial:  present  Right foot: Dorsalis Pedis:  present Posterior Tibial:  present  Neuro:absent protective sensation  Derm: Left foot is ulceration free without.  Right foot has a large ulcer on the lateral aspect of the right great toe from the interphalangeal joint to the webspace. The interphalangeal joint is exposed at the capsule area with well some exposure of the own on the distal and proximal phalanx. There is a fair amount of necrotic tissue. The toe Itself is quite dusky in appearance there is mild medial erythema. The proximal lymphangitis or cellulitis.  No other ulcerative lesions at this time.  Ortho/MS: Diffuse edema to bilateral lower extremities.   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 Likely  osteomyelitis  Plan: I will order an MRI of the right forefoot to evaluate for osteomyelitis. She'll need debridement of the necrotic tissue. She has palpable pulses surprisingly and the remaining toes are intact with good cap fill time. She likely had an infectious process that caused an ischemic event to the lateral aspect of the right great toe. If there is osteomyelitis present on the MRI will undergo amputation in the near future. If there is no signs of osteomyelitis will undergo debridement and we'll consider skin grafting to the area in the future. A culture the wound was taken by myself today.    Elesa Hacker, DPM Cell [phone]   01/30/2015 1:51 PM

## 2015-01-30 NOTE — Progress Notes (Signed)
[facility] [place] NAME: [name]    MR#:  [id number]  DATE OF BIRTH:  1946-01-15  SUBJECTIVE:  CHIEF COMPLAINT:   Chief Complaint  Patient presents with  . Toe Pain   Right foot pain. REVIEW OF SYSTEMS:  CONSTITUTIONAL: No fever, fatigue or weakness.  EYES: No blurred or double vision.  EARS, NOSE, AND THROAT: No tinnitus or ear pain.  RESPIRATORY: No cough, shortness of breath, wheezing or hemoptysis.  CARDIOVASCULAR: No chest pain, orthopnea, edema.  GASTROINTESTINAL: No nausea, vomiting, diarrhea or abdominal pain.  GENITOURINARY: No dysuria, hematuria.  ENDOCRINE: No polyuria, nocturia,  HEMATOLOGY: No anemia, easy bruising or bleeding SKIN: No rash or lesion. MUSCULOSKELETAL: No joint pain or arthritis.  Right foot pain and swelling. NEUROLOGIC: No tingling, numbness, weakness.  PSYCHIATRY: No anxiety or depression.   DRUG ALLERGIES:   Allergies  Allergen Reactions  . Codeine Other (See Comments)    Reaction:  Hallucinations    VITALS:  Blood pressure 168/52, pulse 73, temperature 101.1 F (38.4 C), temperature source Oral, resp. rate 18, height 5\' 7"  (1.702 m), weight 65.872 kg (145 lb 3.5 oz), SpO2 97 %.  PHYSICAL EXAMINATION:  GENERAL:  69 y.o.-year-old patient lying in the bed with no acute distress.  EYES: Pupils equal, round, reactive to light and accommodation. No scleral icterus. Extraocular muscles intact.  HEENT: Head atraumatic, normocephalic. Oropharynx and nasopharynx clear.  NECK:  Supple, no jugular venous distention. No thyroid enlargement, no tenderness.  LUNGS: Normal breath sounds bilaterally, no wheezing, rales,rhonchi or crepitation. No use of accessory muscles of respiration.  CARDIOVASCULAR: S1, S2 normal. No murmurs, rubs, or gallops.  ABDOMEN: Soft, nontender, nondistended. Bowel sounds present. No organomegaly or mass.  EXTREMITIES: Bilateral lower extremity edema 1+, no  cyanosis, or clubbing. Right foot in dressing with tenderness. NEUROLOGIC: Cranial nerves II through XII are intact. Muscle strength 5/5 in all extremities. Sensation intact. Gait not checked.  PSYCHIATRIC: The patient is alert and oriented x 3.  SKIN: No obvious rash, lesion, or ulcer.    LABORATORY PANEL:   CBC  Recent Labs Lab 01/30/15 0413  WBC 3.5*  HGB 7.0*  HCT 22.2*  PLT 251   ------------------------------------------------------------------------------------------------------------------  Chemistries   Recent Labs Lab 01/30/15 0413  NA 140  K 3.8  CL 112*  CO2 22  GLUCOSE 132*  BUN 36*  CREATININE 1.33*  CALCIUM 7.7*   ------------------------------------------------------------------------------------------------------------------  Cardiac Enzymes No results for input(s): TROPONINI in the last 168 hours. ------------------------------------------------------------------------------------------------------------------  RADIOLOGY:  US Venous Img Lower Unilateral Right  01/29/2015   CLINICAL DATA:  Right lower extremity swelling. Swelling for less than 1 month.  EXAM: RIGHT LOWER EXTREMITY VENOUS DOPPLER ULTRASOUND  TECHNIQUE: Gray-scale sonography with graded compression, as well as color Doppler and duplex ultrasound were performed to evaluate the lower extremity deep venous systems from the level of the common femoral vein and including the common femoral, femoral, profunda femoral, popliteal and calf veins including the posterior tibial, peroneal and gastrocnemius veins when visible. The superficial great saphenous vein was also interrogated. Spectral Doppler was utilized to evaluate flow at rest and with distal augmentation maneuvers in the common femoral, femoral and popliteal veins.  COMPARISON:  Lower extremity venous Doppler 12/01/2014  FINDINGS: Contralateral Common Femoral Vein: Respiratory phasicity is normal and symmetric with the symptomatic side. No  evidence of thrombus. Normal compressibility.  Common Femoral Vein: No evidence of thrombus. Normal compressibility, respiratory phasicity and response to  augmentation.  Saphenofemoral Junction: No evidence of thrombus. Normal compressibility and flow on color Doppler imaging.  Profunda Femoral Vein: No evidence of thrombus. Normal compressibility and flow on color Doppler imaging.  Femoral Vein: No evidence of thrombus. Normal compressibility, respiratory phasicity and response to augmentation.  Popliteal Vein: No evidence of thrombus. Normal compressibility, respiratory phasicity and response to augmentation.  Calf Veins: No evidence of thrombus. Normal compressibility and flow on color Doppler imaging.  Superficial Great Saphenous Vein: No evidence of thrombus. Normal compressibility and flow on color Doppler imaging.  Venous Reflux:  None.  Other Findings: Small avascular fluid collection in the popliteal fossa measures 4.1 x 0.8 x 2.9 cm, not significantly changed from prior. Prominent lymph nodes are noted in the right groin, retaining normal reniform morphology.  IMPRESSION: No evidence of right lower extremity deep venous thrombosis.   Electronically Signed   By: Jeb Levering M.D.   On: 01/29/2015 19:21   Dg Toe Great Right  01/29/2015   CLINICAL DATA:  Right great toe pain with open wound. History of diabetes and chronic kidney disease. Subsequent encounter.  EXAM: RIGHT GREAT TOE  COMPARISON:  01/11/2015 radiographs.  FINDINGS: Soft tissue swelling and irregularity again noted. No definite residual soft tissue emphysema identified on the current study. There is no evidence of acute bone destruction, fracture or dislocation. First metatarsal phalangeal degenerative changes and diffuse vascular calcifications are again noted.  IMPRESSION: No radiographic evidence of osteomyelitis. Persistent soft tissue swelling without definite residual emphysema.   Electronically Signed   By: Richardean Sale M.D.    On: 01/29/2015 17:43    EKG:   Orders placed or performed during the hospital encounter of 12/03/14  . ED EKG  . ED EKG  . EKG    ASSESSMENT AND PLAN:   #1 nonhealing diabetic ulcer with cellulitis:  Per Dr. Vickki Muff,  MRI of the foot to rule out osteomyelitis. Follow-up Wound culture and blood culture continue vancomycin and Zosyn. Follow up with Dr. Vickki Muff for debridement or amputation depending on MRI of the foot.   #2 diabetes mellitus type 2: hemoglobin A1c 5.9. continue sliding scale. Hold oral hypoglycemic agents during admission.  #3 hypertension:  Continue home blood pressure medications including Lasix, hydralazine, Isordil, ramipril, propranolol.  #4 chronic kidney disease stage III: Stable.  #5 history of diastolic congestive heart failure: Stable. Continue Lasix.   Anemia. Hemoglobin decreased from 9.2 to 7.0. She will get 1 unit PRBC transfusion today. Follow-up hemoglobin in a.m.  All the records are reviewed and case discussed with Care Management/Social Workerr. Management plans discussed with the patient, her son and they are in agreement.  CODE STATUS: Full code  TOTAL TIME TAKING CARE OF THIS PATIENT: 35 minutes.   POSSIBLE D/C IN 2 DAYS, DEPENDING ON CLINICAL CONDITION.   Demetrios Loll M.D on 01/30/2015 at 2:14 PM  Between 7am to 6pm - Pager - [phone]  After 6pm go to [email or web address] - password [email or web address]  Office  [phone]  CC: Primary care physician; Morton Peters, MD

## 2015-01-30 NOTE — Progress Notes (Signed)
Spoke with Dr Bridgett Larsson concerning pt having order for subcutaneous heparin and pt having recent subdural hematoma and for procedure possibly Friday , also notified Dr Bridgett Larsson  of delay in pt receiving blood because of temp 101, orders received to d/c heparin and give tylenol for temp

## 2015-01-30 NOTE — Progress Notes (Signed)
Krystal, Marsh ([id number]) Visit Report for 01/29/2015 Chief Complaint Document Details Patient Name: [name], [name] 01/29/2015 2:15 Date of Service: PM Medical Record [id number] Number: Patient Account Number: [id number] 26-May-1945 (69 y.o. Treating RN: Cornell Barman Date of Birth/Sex: Female) Other Clinician: Primary Care Physician: Macario Carls, CHARLES Treating Christin Fudge Referring Physician: Reed Breech Physician/Extender: Suella Grove in Treatment: 2 Information Obtained from: Patient Chief Complaint Patients presents for treatment of an open diabetic ulcer. the patient has had a nonhealing wound of the right plantar aspect of foot near the great toe for about 2 months. Electronic Signature(s) Signed: 01/29/2015 3:12:35 PM By: Christin Fudge MD, FACS Entered By: Christin Fudge on 01/29/2015 15:12:35 [name] ([id number]) -------------------------------------------------------------------------------- HPI Details Patient Name: [name], [name] 01/29/2015 2:15 Date of Service: PM Medical Record [id number] Number: Patient Account Number: [id number] 14-Dec-1945 (68 y.o. Treating RN: Cornell Barman Date of Birth/Sex: Female) Other Clinician: Primary Care Physician: Macario Carls, CHARLES Treating Christin Fudge Referring Physician: Reed Breech Physician/Extender: Suella Grove in Treatment: 2 History of Present Illness Location: also on the right foot near the big toe Quality: Patient reports No Pain. Severity: Patient states wound are getting worse. Duration: Patient has had the wound for > 3 months prior to seeking treatment at the wound center Context: The wound appeared gradually over time Modifying Factors: Consults to this date include: was admitted to hospital and treated as an inpatient. Associated Signs and Symptoms: Patient reports having difficulty standing for long periods. HPI Description: 69 year old patient who recently had a subdural hematoma and  not to have surgery in early August 2016. She is known to have diabetes hypertension and had altered mental status which required the surgery. During the admission she was noted to have pressure ulcers of the sacrum, also on the right foot and had stage III chronic kidney disease with a history of chronic data disease. over a period of time her sacral and back ulceration have all healed and the only reason she is here is to consult [place] regarding her right foot. she is not on any antibiotics at the present time. 01/18/2015 -- X-ray of the right big toe shows soft tissue wound along the lateral aspect of the great toe without underlying foreign body or evidence of osteomyelitis. 01/25/2015 -- the patient says for the last 2 days the toes a bit swollen but there is no purulent discharge. He also noticed that there is a opening on the dorsum of the medial part of the right toe. 01/29/2015 -- since I saw her on Friday that was got progressively worse and is now having purulent discharge. She was asked to go to the ER but she came here to review her wound. Electronic Signature(s) Signed: 01/29/2015 3:13:22 PM By: Christin Fudge MD, FACS Entered By: Christin Fudge on 01/29/2015 15:13:22 [name], [name] ([id number]) -------------------------------------------------------------------------------- Physical Exam Details Patient Name: [name], [name] 01/29/2015 2:15 Date of Service: PM Medical Record [id number] Number: Patient Account Number: [id number] 04/04/1946 (68 y.o. Treating RN: Cornell Barman Date of Birth/Sex: Female) Other Clinician: Primary Care Physician: Macario Carls, CHARLES Treating Christin Fudge Referring Physician: Reed Breech Physician/Extender: Suella Grove in Treatment: 2 Constitutional . Pulse regular. Respirations normal and unlabored. Afebrile. . Eyes Nonicteric. Reactive to light. Ears, Nose, Mouth, and Throat Lips, teeth, and gums WNL.Marland Kitchen Moist mucosa without lesions  . Neck supple and nontender. No palpable supraclavicular or cervical adenopathy. Normal sized without goiter. Respiratory WNL. No retractions.. Cardiovascular Pedal Pulses WNL. No clubbing, cyanosis or edema. Lymphatic  No adneopathy. No adenopathy. No adenopathy. Musculoskeletal Adexa without tenderness or enlargement.. Digits and nails w/o clubbing, cyanosis, infection, petechiae, ischemia, or inflammatory conditions.. Integumentary (Hair, Skin) No suspicious lesions. No crepitus or fluctuance. No peri-wound warmth or erythema. No masses.Marland Kitchen Psychiatric Judgement and insight Intact.. No evidence of depression, anxiety, or agitation.. Notes The right toe has gotten progressively worse and there is wet gangrene in the medial side of her right big toe going on to the plantar aspect and has purulent discharge. Electronic Signature(s) Signed: 01/29/2015 3:16:38 PM By: Christin Fudge MD, FACS Entered By: Christin Fudge on 01/29/2015 15:16:37 [name] ([id number]) -------------------------------------------------------------------------------- Physician Orders Details Patient Name: [name], [name] 01/29/2015 2:15 Date of Service: PM Medical Record [id number] Number: Patient Account Number: [id number] 04-25-1946 (68 y.o. Treating RN: Montey Hora Date of Birth/Sex: Female) Other Clinician: Primary Care Physician: Macario Carls, CHARLES Treating Christin Fudge Referring Physician: Reed Breech Physician/Extender: Suella Grove in Treatment: 2 Verbal / Phone Orders: Yes Clinician: Montey Hora Read Back and Verified: No Diagnosis Coding Wound Cleansing Wound #1 Right Metatarsal head first o Clean wound with Normal Saline. Wound #3 Right,Plantar Toe Great o Clean wound with Normal Saline. Anesthetic Wound #1 Right Metatarsal head first o Topical Lidocaine 4% cream applied to wound bed prior to debridement Wound #3 Right,Plantar Toe Great o Topical Lidocaine 4%  cream applied to wound bed prior to debridement Primary Wound Dressing o Dry Gauze Wound #3 Right,Plantar Toe Great o Dry Gauze Secondary Dressing Wound #1 Right Metatarsal head first o Gauze and Kerlix/Conform Wound #3 Right,Plantar Toe Great o Gauze and Kerlix/Conform Dressing Change Frequency Wound #1 Right Metatarsal head first o Change dressing every day. Wound #3 Right,Plantar Toe Great o Change dressing every day. Follow-up Appointments [name], [name] ([id number]) Wound #1 Right Metatarsal head first o Return Appointment in 1 week. Edema Control Wound #1 Right Metatarsal head first o Elevate legs to the level of the heart and pump ankles as often as possible Wound #3 Right,Plantar Toe Great o Elevate legs to the level of the heart and pump ankles as often as possible Medications-please add to medication list. Wound #1 Right Metatarsal head first o P.O. Antibiotics - Augmentin 875/125 dispense 20; BID for 10 days Wound #3 Right,Plantar Toe Great o P.O. Antibiotics - Augmentin 875/125 dispense 20; BID for 10 days Electronic Signature(s) Signed: 01/29/2015 3:53:46 PM By: Christin Fudge MD, FACS Signed: 01/29/2015 4:21:27 PM By: Montey Hora Entered By: Montey Hora on 01/29/2015 15:09:16 [name] ([id number]) -------------------------------------------------------------------------------- Problem List Details Patient Name: [name], [name] 01/29/2015 2:15 Date of Service: PM Medical Record [id number] Number: Patient Account Number: [id number] 10/03/1945 (68 y.o. 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 Treating RN: Cornell Barman Date of Birth/Sex: Female) Other Clinician: Primary Care Physician: Macario Carls, CHARLES Treating Christin Fudge Referring Physician: Reed Breech Physician/Extender: Suella Grove in  Treatment: 2 Subjective Chief Complaint Information obtained from Patient Patients presents for treatment of an open diabetic ulcer. the patient has had a nonhealing wound of the right plantar aspect of foot near the great toe for about 2 months. History of Present Illness (HPI) The following HPI elements were documented for the patient's wound: Location: also on the right foot near the big toe Quality: Patient reports No Pain. Severity: Patient states wound are getting worse. Duration: Patient has had the wound for > 3 months prior to seeking treatment at the wound center Context: The wound appeared gradually over time Modifying Factors: Consults to this date include: was admitted to hospital and treated as an inpatient. Associated Signs and Symptoms: Patient reports having difficulty standing for long periods. 69 year old patient who recently had a subdural hematoma and not to have surgery in early August 2016. She is known to have diabetes hypertension and had altered mental status which required the surgery. During the admission she was noted to have pressure ulcers of the sacrum, also on the right foot and had stage III chronic kidney  disease with a history of chronic data disease. over a period of time her sacral and back ulceration have all healed and the only reason she is here is to consult [place] regarding her right foot. she is not on any antibiotics at the present time. 01/18/2015 -- X-ray of the right big toe shows soft tissue wound along the lateral aspect of the great toe without underlying foreign body or evidence of osteomyelitis. 01/25/2015 -- the patient says for the last 2 days the toes a bit swollen but there is no purulent discharge. He also noticed that there is a opening on the dorsum of the medial part of the right toe. 01/29/2015 -- since I saw her on Friday that was got progressively worse and is now having purulent discharge. She was asked to go to the ER but she came here to review her wound. Objective [name], Krystal C. ([id number]) Constitutional Pulse regular. Respirations normal and unlabored. Afebrile. Vitals Time Taken: 4:52 AM, Weight: 145 lbs, Temperature: 98.2 F, Pulse: 69 bpm, Respiratory Rate: 18 breaths/min, Blood Pressure: 136/42 mmHg. Eyes Nonicteric. Reactive to light. Ears, Nose, Mouth, and Throat Lips, teeth, and gums WNL.Marland Kitchen Moist mucosa without lesions . Neck supple and nontender. No palpable supraclavicular or cervical adenopathy. Normal sized without goiter. Respiratory WNL. No retractions.. Cardiovascular Pedal Pulses WNL. No clubbing, cyanosis or edema. Lymphatic No adneopathy. No adenopathy. No adenopathy. Musculoskeletal Adexa without tenderness or enlargement.. Digits and nails w/o clubbing, cyanosis, infection, petechiae, ischemia, or inflammatory conditions.Marland Kitchen Psychiatric Judgement and insight Intact.. No evidence of depression, anxiety, or agitation.. General Notes: The right toe has gotten progressively worse and there is wet gangrene in the medial side of her right big toe going on to the plantar aspect and has purulent discharge. Integumentary (Hair, Skin) No  suspicious lesions. No crepitus or fluctuance. No peri-wound warmth or erythema. No masses.. Wound #1 status is Open. Original cause of wound was Gradually Appeared. The wound is located on the Right Metatarsal head first. The wound measures 3.5cm length x 4cm width x 0.5cm depth; 10.996cm^2 area and 5.498cm^3 volume. The wound is limited to skin breakdown. There is no tunneling noted. There is a small amount of serous drainage noted. The wound margin is indistinct and nonvisible. There is large (67- 100%) red granulation within the wound bed. There is a small (1-33%) amount of necrotic tissue within the wound bed including Adherent Slough.  The periwound skin appearance exhibited: Maceration, Moist, Ecchymosis. The periwound skin appearance did not exhibit: Callus, Crepitus, Excoriation, Fluctuance, Friable, Induration, Localized Edema, Rash, Scarring, Dry/Scaly, Atrophie Blanche, Cyanosis, Hemosiderin Staining, Mottled, Pallor, Rubor, Erythema. Wound #3 status is Open. Original cause of wound was Gradually Appeared. The wound is located on the [name], [name]. ([id number]) Right,Plantar Toe Great. The wound measures 0.2cm length x 0.2cm width x 0.5cm depth; 0.031cm^2 area and 0.016cm^3 volume. The wound is limited to skin breakdown. There is tunneling at 5:00 with a maximum distance of 2cm. There is a large amount of purulent drainage noted. The wound margin is flat and intact. There is no granulation within the wound bed. There is no necrotic tissue within the wound bed. The periwound skin appearance did not exhibit: Callus, Crepitus, Excoriation, Fluctuance, Friable, Induration, Localized Edema, Rash, Scarring, Dry/Scaly, Maceration, Moist, Atrophie Blanche, Cyanosis, Ecchymosis, Hemosiderin Staining, Mottled, Pallor, Rubor, Erythema. Assessment Active Problems ICD-10 E11.621 - Type 2 diabetes mellitus with foot ulcer L97.512 - Non-pressure chronic ulcer of other part of right foot with fat  layer exposed The patient's right big toe has gotten progressively worse and in spite of oral antibiotics has now progressed to wet gangrene. I have spoken to the ER physician Dr. Marcelene Butte, who has kindly agreed to admit her for IV antibiotics, a podiatry consult and possible amputation of the right big toe. the patient and her family have been given instructions and they will go straight to the ER. Plan Wound Cleansing: Wound #1 Right Metatarsal head first: Clean wound with Normal Saline. Wound #3 Right,Plantar Toe Great: Clean wound with Normal Saline. Anesthetic: Wound #1 Right Metatarsal head first: Topical Lidocaine 4% cream applied to wound bed prior to debridement Wound #3 Right,Plantar Toe Great: Topical Lidocaine 4% cream applied to wound bed prior to debridement Primary Wound Dressing: Dry Gauze Wound #3 Right,Plantar Toe Great: Dry Gauze Secondary Dressing: Wound #1 Right Metatarsal head first: Gauze and Kerlix/Conform [name], Krystal C. ([id number]) Wound #3 Right,Plantar Toe Great: Gauze and Kerlix/Conform Dressing Change Frequency: Wound #1 Right Metatarsal head first: Change dressing every day. Wound #3 Right,Plantar Toe Great: Change dressing every day. Follow-up Appointments: Wound #1 Right Metatarsal head first: Return Appointment in 1 week. Edema Control: Wound #1 Right Metatarsal head first: Elevate legs to the level of the heart and pump ankles as often as possible Wound #3 Right,Plantar Toe Great: Elevate legs to the level of the heart and pump ankles as often as possible Medications-please add to medication list.: Wound #1 Right Metatarsal head first: P.O. Antibiotics - Augmentin 875/125 dispense 20; BID for 10 days Wound #3 Right,Plantar Toe Great: P.O. Antibiotics - Augmentin 875/125 dispense 20; BID for 10 days The patient's right big toe has gotten progressively worse and in spite of oral antibiotics has now progressed to wet gangrene. I have  spoken to the ER physician Dr. Marcelene Butte, who has kindly agreed to admit her for IV antibiotics, a podiatry consult and possible amputation of the right big toe. the patient and her family have been given instructions and they will go straight to the ER. Electronic Signature(s) Signed: 01/29/2015 3:18:27 PM By: Christin Fudge MD, FACS Entered By: Christin Fudge on 01/29/2015 15:18:27 [name] ([id number]) -------------------------------------------------------------------------------- SuperBill Details Patient Name: [name] Date of Service: 01/29/2015 Medical Record Number: [id number] Patient Account Number: [id number] Date of Birth/Sex: 1945/09/03 (69 y.o. 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 gangrene Facility Procedures CPT4 Code: [id number] Description: 99213 - WOUND CARE VISIT-LEV 3 EST PT Modifier: Quantity: 1 Physician Procedures CPT4: Description Modifier Quantity Code [id number] 99213 - WC PHYS LEVEL 3 - EST PT 1 ICD-10 Description Diagnosis E11.621 Type 2 diabetes mellitus with foot ulcer L97.512 Non-pressure chronic ulcer of other part of right foot with fat layer exposed E08.52  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

## 2015-01-31 ENCOUNTER — Inpatient Hospital Stay: Payer: Medicare HMO

## 2015-01-31 LAB — CBC
HEMATOCRIT: 26.4 % — AB (ref 35.0–47.0)
HEMOGLOBIN: 8.7 g/dL — AB (ref 12.0–16.0)
MCH: 28.3 pg (ref 26.0–34.0)
MCHC: 32.9 g/dL (ref 32.0–36.0)
MCV: 86 fL (ref 80.0–100.0)
Platelets: 285 10*3/uL (ref 150–440)
RBC: 3.07 MIL/uL — ABNORMAL LOW (ref 3.80–5.20)
RDW: 19 % — AB (ref 11.5–14.5)
WBC: 4.1 10*3/uL (ref 3.6–11.0)

## 2015-01-31 LAB — GLUCOSE, CAPILLARY
GLUCOSE-CAPILLARY: 106 mg/dL — AB (ref 65–99)
GLUCOSE-CAPILLARY: 110 mg/dL — AB (ref 65–99)
GLUCOSE-CAPILLARY: 116 mg/dL — AB (ref 65–99)
Glucose-Capillary: 166 mg/dL — ABNORMAL HIGH (ref 65–99)
Glucose-Capillary: 133 mg/dL — ABNORMAL HIGH (ref 65–99)

## 2015-01-31 LAB — TYPE AND SCREEN
ABO/RH(D): O POS
ANTIBODY SCREEN: NEGATIVE
Unit division: 0

## 2015-01-31 MED ORDER — RAMIPRIL 2.5 MG PO CAPS
2.5000 mg | ORAL_CAPSULE | Freq: Once | ORAL | Status: DC
  Filled 2015-01-31: qty 1

## 2015-01-31 MED ORDER — RAMIPRIL 5 MG PO CAPS
10.0000 mg | ORAL_CAPSULE | Freq: Every day | ORAL | Status: DC
  Administered 2015-02-01 – 2015-02-02 (×2): 10 mg via ORAL
  Filled 2015-01-31 (×2): qty 2

## 2015-01-31 NOTE — Progress Notes (Signed)
[place] [facility] [place] NAME: [name]    MR#:  [id number]  DATE OF BIRTH:  1945/12/14  SUBJECTIVE:  CHIEF COMPLAINT:   Chief Complaint  Patient presents with  . Toe Pain   No complaint. REVIEW OF SYSTEMS:  CONSTITUTIONAL: No fever, fatigue or weakness.  EYES: No blurred or double vision.  EARS, NOSE, AND THROAT: No tinnitus or ear pain.  RESPIRATORY: No cough, shortness of breath, wheezing or hemoptysis.  CARDIOVASCULAR: No chest pain, orthopnea, edema.  GASTROINTESTINAL: No nausea, vomiting, diarrhea or abdominal pain.  GENITOURINARY: No dysuria, hematuria.  ENDOCRINE: No polyuria, nocturia,  HEMATOLOGY: No anemia, easy bruising or bleeding SKIN: No rash or lesion. MUSCULOSKELETAL: No joint pain or arthritis.  Right foot pain and swelling. NEUROLOGIC: No tingling, numbness, weakness.  PSYCHIATRY: No anxiety or depression.   DRUG ALLERGIES:   Allergies  Allergen Reactions  . Codeine Other (See Comments)    Reaction:  Hallucinations    VITALS:  Blood pressure 180/60, pulse 69, temperature 98.5 F (36.9 C), temperature source Oral, resp. rate 16, height 5\' 7"  (1.702 m), weight 64.774 kg (142 lb 12.8 oz), SpO2 98 %.  PHYSICAL EXAMINATION:  GENERAL:  69 y.o.-year-old patient lying in the bed with no acute distress.  EYES: Pupils equal, round, reactive to light and accommodation. No scleral icterus. Extraocular muscles intact.  HEENT: Head atraumatic, normocephalic. Oropharynx and nasopharynx clear.  NECK:  Supple, no jugular venous distention. No thyroid enlargement, no tenderness.  LUNGS: Normal breath sounds bilaterally, no wheezing, rales,rhonchi or crepitation. No use of accessory muscles of respiration.  CARDIOVASCULAR: S1, S2 normal. No murmurs, rubs, or gallops.  ABDOMEN: Soft, nontender, nondistended. Bowel sounds present. No organomegaly or mass.  EXTREMITIES: Bilateral lower extremity edema 1+, no cyanosis,  or clubbing. Right foot in dressing with tenderness and mild bleeding from the wound. NEUROLOGIC: Cranial nerves II through XII are intact. Muscle strength 5/5 in all extremities. Sensation intact. Gait not checked.  PSYCHIATRIC: The patient is alert and oriented x 3.  SKIN: No obvious rash, lesion, or ulcer.    LABORATORY PANEL:   CBC  Recent Labs Lab 01/31/15 0608  WBC 4.1  HGB 8.7*  HCT 26.4*  PLT 285   ------------------------------------------------------------------------------------------------------------------  Chemistries   Recent Labs Lab 01/30/15 0413  NA 140  K 3.8  CL 112*  CO2 22  GLUCOSE 132*  BUN 36*  CREATININE 1.33*  CALCIUM 7.7*   ------------------------------------------------------------------------------------------------------------------  Cardiac Enzymes No results for input(s): TROPONINI in the last 168 hours. ------------------------------------------------------------------------------------------------------------------  RADIOLOGY:  US Venous Img Lower Unilateral Right  01/29/2015   CLINICAL DATA:  Right lower extremity swelling. Swelling for less than 1 month.  EXAM: RIGHT LOWER EXTREMITY VENOUS DOPPLER ULTRASOUND  TECHNIQUE: Gray-scale sonography with graded compression, as well as color Doppler and duplex ultrasound were performed to evaluate the lower extremity deep venous systems from the level of the common femoral vein and including the common femoral, femoral, profunda femoral, popliteal and calf veins including the posterior tibial, peroneal and gastrocnemius veins when visible. The superficial great saphenous vein was also interrogated. Spectral Doppler was utilized to evaluate flow at rest and with distal augmentation maneuvers in the common femoral, femoral and popliteal veins.  COMPARISON:  Lower extremity venous Doppler 12/01/2014  FINDINGS: Contralateral Common Femoral Vein: Respiratory phasicity is normal and symmetric with the  symptomatic side. No evidence of thrombus. Normal compressibility.  Common Femoral Vein: No evidence of thrombus. Normal compressibility,  respiratory phasicity and response to augmentation.  Saphenofemoral Junction: No evidence of thrombus. Normal compressibility and flow on color Doppler imaging.  Profunda Femoral Vein: No evidence of thrombus. Normal compressibility and flow on color Doppler imaging.  Femoral Vein: No evidence of thrombus. Normal compressibility, respiratory phasicity and response to augmentation.  Popliteal Vein: No evidence of thrombus. Normal compressibility, respiratory phasicity and response to augmentation.  Calf Veins: No evidence of thrombus. Normal compressibility and flow on color Doppler imaging.  Superficial Great Saphenous Vein: No evidence of thrombus. Normal compressibility and flow on color Doppler imaging.  Venous Reflux:  None.  Other Findings: Small avascular fluid collection in the popliteal fossa measures 4.1 x 0.8 x 2.9 cm, not significantly changed from prior. Prominent lymph nodes are noted in the right groin, retaining normal reniform morphology.  IMPRESSION: No evidence of right lower extremity deep venous thrombosis.   Electronically Signed   By: Jeb Levering M.D.   On: 01/29/2015 19:21   Dg Toe Great Right  01/29/2015   CLINICAL DATA:  Right great toe pain with open wound. History of diabetes and chronic kidney disease. Subsequent encounter.  EXAM: RIGHT GREAT TOE  COMPARISON:  01/11/2015 radiographs.  FINDINGS: Soft tissue swelling and irregularity again noted. No definite residual soft tissue emphysema identified on the current study. There is no evidence of acute bone destruction, fracture or dislocation. First metatarsal phalangeal degenerative changes and diffuse vascular calcifications are again noted.  IMPRESSION: No radiographic evidence of osteomyelitis. Persistent soft tissue swelling without definite residual emphysema.   Electronically Signed   By:  Richardean Sale M.D.   On: 01/29/2015 17:43    EKG:   Orders placed or performed during the hospital encounter of 12/03/14  . ED EKG  . ED EKG  . EKG    ASSESSMENT AND PLAN:   #1 nonhealing diabetic ulcer with cellulitis:  Per Dr. Vickki Muff,  MRI of the foot to rule out osteomyelitis. Follow-up Wound culture and blood culture continue vancomycin and Zosyn. Follow up with Dr. Vickki Muff for debridement or amputation depending on MRI of the foot.   #2 diabetes mellitus type 2: hemoglobin A1c 5.9. continue sliding scale. Hold oral hypoglycemic agents during admission.  #3 hypertension:  Continue home blood pressure medications including Lasix, hydralazine, Isordil, ramipril, propranolol.  #4 chronic kidney disease stage III: Stable.  #5 history of diastolic congestive heart failure: Stable. Continue Lasix.   Anemia. Hemoglobin decreased from 9.2 to 7.0. She will get 1 unit PRBC transfusion yesterday and hemoglobin is up to 8.7 today.  I discussed with Dr. Vickki Muff. All the records are reviewed and case discussed with Care Management/Social Workerr. Management plans discussed with the patient, her son and they are in agreement.  CODE STATUS: Full code  TOTAL TIME TAKING CARE OF THIS PATIENT: 36 minutes.   POSSIBLE D/C IN 2 DAYS, DEPENDING ON CLINICAL CONDITION.   Demetrios Loll M.D on 01/31/2015 at 2:04 PM  Between 7am to 6pm - Pager - [phone]  After 6pm go to [email or web address] - password [email or web address]  Office  [phone]  CC: Primary care physician; Morton Peters, MD

## 2015-01-31 NOTE — Progress Notes (Signed)
Pt transported to MRI 

## 2015-01-31 NOTE — Progress Notes (Signed)
ANTIBIOTIC CONSULT NOTE - Follow Up  Pharmacy Consult for vancomycin & zosyn Indication: diabetic foot ulcer   Allergies  Allergen Reactions  . Codeine Other (See Comments)    Reaction:  Hallucinations    Patient Measurements: Height: 5\' 7"  (170.2 cm) Weight: 142 lb 12.8 oz (64.774 kg) IBW/kg (Calculated) : 61.6  Vital Signs: Temp: 98.5 F (36.9 C) (09/29 0722) Temp Source: Oral (09/29 0722) BP: 180/60 mmHg (09/29 0859) Pulse Rate: 69 (09/29 0722)  Labs:  Recent Labs  01/29/15 1606 01/30/15 0413 01/31/15 0608  WBC 3.3* 3.5* 4.1  HGB 7.0* 7.0* 8.7*  PLT 251 251 285  CREATININE 1.38* 1.33*  --    Estimated Creatinine Clearance: 38.8 mL/min (by C-G formula based on Cr of 1.33). No results for input(s): VANCOTROUGH, VANCOPEAK, VANCORANDOM, GENTTROUGH, GENTPEAK, GENTRANDOM, TOBRATROUGH, TOBRAPEAK, TOBRARND, AMIKACINPEAK, AMIKACINTROU, AMIKACIN in the last 72 hours.   Microbiology: Recent Results (from the past 720 hour(s))  Blood culture (routine x 2)     Status: None (Preliminary result)   Collection Time: 01/29/15  4:06 PM  Result Value Ref Range Status   Specimen Description BLOOD LEFT ARM  Final   Special Requests BOTTLES DRAWN AEROBIC AND ANAEROBIC 2CC  Final   Culture  Setup Time   Final    GRAM POSITIVE COCCI AEROBIC BOTTLE ONLY CRITICAL RESULT CALLED TO, READ BACK BY AND VERIFIED WITH: TERESA COBLE AT 2150 01/30/15 SDR CONFIRMED BY AJO    Culture   Final    COAGULASE NEGATIVE STAPHYLOCOCCUS AEROBIC BOTTLE ONLY Results consistent with contamination.    Report Status PENDING  Incomplete  Wound culture     Status: None (Preliminary result)   Collection Time: 01/29/15  5:15 PM  Result Value Ref Range Status   Specimen Description TOE  RIGHT  Final   Special Requests NONE  Final   Gram Stain   Final    FEW WBC SEEN RARE GRAM POSITIVE COCCI IN PAIRS RARE GRAM NEGATIVE RODS    Culture HOLDING FOR POSSIBLE PATHOGEN  Final   Report Status PENDING   Incomplete  Blood culture (routine x 2)     Status: None (Preliminary result)   Collection Time: 01/29/15  6:10 PM  Result Value Ref Range Status   Specimen Description BLOOD RIGHT HAND  Final   Special Requests   Final    BOTTLES DRAWN AEROBIC AND ANAEROBIC  AER 1CC ANA 5CC   Culture NO GROWTH 2 DAYS  Final   Report Status PENDING  Incomplete    Medical History: Past Medical History  Diagnosis Date  . Hypertension   . Diabetes mellitus without complication   . Anemia   . Coronary artery disease   . CHF (congestive heart failure)   . Glaucoma   . Hyperlipidemia   . Chronic kidney disease   . Neuropathy   . Chronic anemia   . Stroke    Assessment: Pharmacy consulted to dose vancomycin & zosyn for diabetic foot ulcer in this 69 year old female. Per MD, no evidence of osteo on xray.   PK parameters: SCr: 1.33, CrCl: 38.8 ml/min Ke: 0.037hr^-1, t1/2: 18.7h, Vd: 44.1L  Goal of Therapy:  Vancomycin trough level 15-20 mcg/ml  Plan:  Continue Vancomycin 1 gm IV q24h.   Trough ordered prior to 4th dose, which should be at steady state.   Continue zosyn 3.375gm IV Q8H extended infusion.  Pharmacy to follow per consult  Murrell Converse, PharmD Clinical Pharmacist 01/31/2015

## 2015-01-31 NOTE — Progress Notes (Signed)
introduced myself to patient.  She asked that I pray for her--- I prayed and offered support.  Chaplain Lewanda Rife [phone]

## 2015-01-31 NOTE — Clinical Documentation Improvement (Signed)
Internal Medicine  Can the diagnosis of anemia be further specified?   Iron deficiency Anemia  Nutritional anemia, including the nutrition or mineral deficits  Chronic Anemia, including the suspected or known cause  Anemia of chronic disease, including the associated chronic disease state  Other  Clinically Undetermined  Document any associated diagnoses/conditions.   Supporting Information: Component     Latest Ref Rng 01/29/2015 01/30/2015 01/31/2015            RBC     3.80 - 5.20 MIL/uL 2.46 (L) 2.52 (L) 3.07 (L)  Hemoglobin     12.0 - 16.0 g/dL 7.0 (L) 7.0 (L) 8.7 (L)  HCT     35.0 - 47.0 % 21.6 (L) 22.2 (L) 26.4 (L)   ferrous sulfate 325 (65 FE) MG tablet Take 325 mg by mouth daily         Please exercise your independent, professional judgment when responding. A specific answer is not anticipated or expected.   Thank You,  [place] [phone]

## 2015-01-31 NOTE — Progress Notes (Signed)
Pt off floor for MRI.  Will plan to go to OR tomorrow.  Depending on MRI will decide if can perform debridement only or if will need toe amputation.  Have previously d/w pt and she expressed understanding. Will discuss with pt in AM after MRI results seen. NPO p midnight.

## 2015-01-31 NOTE — Progress Notes (Signed)
Pt was mildly confused during the night. She pulled out her IV and attempted to get out of bed several times.

## 2015-02-01 ENCOUNTER — Ambulatory Visit: Payer: Medicare HMO | Admitting: Surgery

## 2015-02-01 ENCOUNTER — Encounter
Admission: EM | Disposition: A | Discharge status: Home Health | Payer: Self-pay | Source: Home / Self Care | Attending: Internal Medicine

## 2015-02-01 ENCOUNTER — Inpatient Hospital Stay: Payer: Medicare HMO | Admitting: Anesthesiology

## 2015-02-01 ENCOUNTER — Encounter: Payer: Self-pay | Admitting: Podiatry

## 2015-02-01 HISTORY — PX: IRRIGATION AND DEBRIDEMENT FOOT: SHX6602

## 2015-02-01 LAB — GLUCOSE, CAPILLARY
GLUCOSE-CAPILLARY: 157 mg/dL — AB (ref 65–99)
Glucose-Capillary: 144 mg/dL — ABNORMAL HIGH (ref 65–99)
Glucose-Capillary: 106 mg/dL — ABNORMAL HIGH (ref 65–99)
Glucose-Capillary: 132 mg/dL — ABNORMAL HIGH (ref 65–99)

## 2015-02-01 LAB — GRAM STAIN

## 2015-02-01 LAB — CREATININE, SERUM
Creatinine, Ser: 1.35 mg/dL — ABNORMAL HIGH (ref 0.44–1.00)
GFR calc non Af Amer: 39 mL/min — ABNORMAL LOW (ref >60)
GFR, EST AFRICAN AMERICAN: 45 mL/min — AB (ref >60)

## 2015-02-01 LAB — SURGICAL PCR SCREEN
MRSA, PCR: NEGATIVE
Staphylococcus aureus: NEGATIVE

## 2015-02-01 LAB — WOUND CULTURE

## 2015-02-01 SURGERY — IRRIGATION AND DEBRIDEMENT FOOT
Anesthesia: Monitor Anesthesia Care | Site: Toe | Laterality: Right | Wound class: Dirty or Infected

## 2015-02-01 MED ORDER — FENTANYL CITRATE (PF) 100 MCG/2ML IJ SOLN
INTRAMUSCULAR | Status: DC | PRN
  Administered 2015-02-01 (×2): 50 ug via INTRAVENOUS

## 2015-02-01 MED ORDER — LACTATED RINGERS IV SOLN
INTRAVENOUS | Status: DC | PRN
  Administered 2015-02-01: 10:00:00 via INTRAVENOUS

## 2015-02-01 MED ORDER — HYDRALAZINE HCL 20 MG/ML IJ SOLN: 10.0000 mg | Freq: Four times a day (QID) | INTRAMUSCULAR | Status: DC | PRN

## 2015-02-01 MED ORDER — MIDAZOLAM HCL 2 MG/2ML IJ SOLN
INTRAMUSCULAR | Status: DC | PRN
  Administered 2015-02-01 (×2): 1 mg via INTRAVENOUS

## 2015-02-01 MED ORDER — PROMETHAZINE HCL 25 MG/ML IJ SOLN: 6.2500 mg | INTRAMUSCULAR | Status: DC | PRN

## 2015-02-01 MED ORDER — SODIUM CHLORIDE 0.9 % IR SOLN
Status: DC | PRN
  Administered 2015-02-01: 400 mL

## 2015-02-01 MED ORDER — SODIUM CHLORIDE 0.9 % IV SOLN
1.0000 g | Freq: Two times a day (BID) | INTRAVENOUS | Status: DC
  Administered 2015-02-01 – 2015-02-02 (×2): 1 g via INTRAVENOUS
  Filled 2015-02-01 (×4): qty 1

## 2015-02-01 MED ORDER — SODIUM CHLORIDE 0.9 % IV SOLN
500.0000 mg | Freq: Three times a day (TID) | INTRAVENOUS | Status: DC
  Administered 2015-02-01: 500 mg via INTRAVENOUS
  Filled 2015-02-01 (×4): qty 500

## 2015-02-01 MED ORDER — BUPIVACAINE HCL 0.5 % IJ SOLN
INTRAMUSCULAR | Status: DC | PRN
  Administered 2015-02-01: 4 mL

## 2015-02-01 MED ORDER — LIDOCAINE HCL (PF) 1 % IJ SOLN
INTRAMUSCULAR | Status: DC | PRN
  Administered 2015-02-01: 4 mL

## 2015-02-01 MED ORDER — FENTANYL CITRATE (PF) 100 MCG/2ML IJ SOLN: 25.0000 ug | INTRAMUSCULAR | Status: DC | PRN

## 2015-02-01 SURGICAL SUPPLY — 68 items
BANDAGE ELASTIC 4 CLIP NS LF (GAUZE/BANDAGES/DRESSINGS) ×3 IMPLANT
BANDAGE ELASTIC 4 CLIP ST LF (GAUZE/BANDAGES/DRESSINGS) ×1 IMPLANT
BANDAGE STRETCH 3X4.1 STRL (GAUZE/BANDAGES/DRESSINGS) ×6 IMPLANT
BLADE OSC/SAGITTAL MD 5.5X18 (BLADE) ×1 IMPLANT
BLADE OSCILLATING/SAGITTAL (BLADE)
BLADE SURG 15 STRL LF DISP TIS (BLADE) ×1 IMPLANT
BLADE SURG 15 STRL SS (BLADE)
BLADE SURG MINI STRL (BLADE) ×1 IMPLANT
BLADE SW THK.38XMED LNG THN (BLADE) ×1 IMPLANT
BNDG CMPR 75X21 PLY HI ABS (MISCELLANEOUS) ×2
BNDG COHESIVE 4X5 TAN STRL (GAUZE/BANDAGES/DRESSINGS) ×1 IMPLANT
BNDG COHESIVE 6X5 TAN STRL LF (GAUZE/BANDAGES/DRESSINGS) ×1 IMPLANT
BNDG ESMARK 4X12 TAN STRL LF (GAUZE/BANDAGES/DRESSINGS) ×1 IMPLANT
BNDG GAUZE 4.5X4.1 6PLY STRL (MISCELLANEOUS) ×3 IMPLANT
CANISTER SUCT 1200ML W/VALVE (MISCELLANEOUS) ×3 IMPLANT
CANISTER SUCT 3000ML (MISCELLANEOUS) ×3 IMPLANT
CUFF TOURN 18 STER (MISCELLANEOUS) ×1 IMPLANT
CUFF TOURN DUAL PL 12 NO SLV (MISCELLANEOUS) ×3 IMPLANT
DRAPE FLUOR MINI C-ARM 54X84 (DRAPES) ×1 IMPLANT
DRAPE XRAY CASSETTE 23X24 (DRAPES) ×3 IMPLANT
DURAPREP 26ML APPLICATOR (WOUND CARE) ×3 IMPLANT
GAUZE IODOFORM PACK 1/2 7832 (GAUZE/BANDAGES/DRESSINGS) ×1 IMPLANT
GAUZE PACKING 1/4X5YD (GAUZE/BANDAGES/DRESSINGS) ×1 IMPLANT
GAUZE PACKING IODOFORM 1X5 (MISCELLANEOUS) ×3 IMPLANT
GAUZE PETRO XEROFOAM 1X8 (MISCELLANEOUS) ×3 IMPLANT
GAUZE SPONGE 4X4 12PLY STRL (GAUZE/BANDAGES/DRESSINGS) ×3 IMPLANT
GAUZE STRETCH 2X75IN STRL (MISCELLANEOUS) ×3 IMPLANT
GLOVE BIO SURGEON STRL SZ7.5 (GLOVE) ×3 IMPLANT
GLOVE INDICATOR 8.0 STRL GRN (GLOVE) ×3 IMPLANT
GOWN STRL REUS W/ TWL LRG LVL3 (GOWN DISPOSABLE) ×4 IMPLANT
GOWN STRL REUS W/TWL LRG LVL3 (GOWN DISPOSABLE) ×6
GOWN STRL REUS W/TWL MED LVL3 (GOWN DISPOSABLE) ×6 IMPLANT
HANDPIECE SUCTION TUBG SURGILV (MISCELLANEOUS) ×3 IMPLANT
HANDPIECE VERSAJET DEBRIDEMENT (MISCELLANEOUS) ×3 IMPLANT
IV NS 1000ML (IV SOLUTION) ×3
IV NS 1000ML BAXH (IV SOLUTION) ×2 IMPLANT
KIT RM TURNOVER STRD PROC AR (KITS) ×3 IMPLANT
LABEL OR SOLS (LABEL) ×3 IMPLANT
NDL FILTER BLUNT 18X1 1/2 (NEEDLE) ×1 IMPLANT
NDL HYPO 25X1 1.5 SAFETY (NEEDLE) ×1 IMPLANT
NEEDLE FILTER BLUNT 18X 1/2SAF (NEEDLE) ×1
NEEDLE FILTER BLUNT 18X1 1/2 (NEEDLE) ×2 IMPLANT
NEEDLE HYPO 25X1 1.5 SAFETY (NEEDLE) ×3 IMPLANT
NS IRRIG 500ML POUR BTL (IV SOLUTION) ×3 IMPLANT
PACK EXTREMITY ARMC (MISCELLANEOUS) ×3 IMPLANT
PAD ABD DERMACEA PRESS 5X9 (GAUZE/BANDAGES/DRESSINGS) ×6 IMPLANT
PAD GROUND ADULT SPLIT (MISCELLANEOUS) ×3 IMPLANT
PENCIL ELECTRO HAND CTR (MISCELLANEOUS) ×3 IMPLANT
RASP SM TEAR CROSS CUT (RASP) ×1 IMPLANT
SOL .9 NS 3000ML IRR  AL (IV SOLUTION) ×1
SOL .9 NS 3000ML IRR AL (IV SOLUTION) ×2
SOL .9 NS 3000ML IRR UROMATIC (IV SOLUTION) ×2 IMPLANT
STOCKINETTE IMPERVIOUS 9X36 MD (GAUZE/BANDAGES/DRESSINGS) ×3 IMPLANT
STOCKINETTE M/LG 89821 (MISCELLANEOUS) ×1 IMPLANT
STRAP SAFETY BODY (MISCELLANEOUS) ×3 IMPLANT
SUT ETHILON 2 0 FS 18 (SUTURE) ×2 IMPLANT
SUT ETHILON 3-0 FS-10 30 BLK (SUTURE) ×3
SUT ETHILON 4-0 (SUTURE) ×3
SUT ETHILON 4-0 FS2 18XMFL BLK (SUTURE) ×2
SUT ETHILON 5-0 FS-2 18 BLK (SUTURE) ×3 IMPLANT
SUT VIC AB 3-0 SH 27 (SUTURE)
SUT VIC AB 3-0 SH 27X BRD (SUTURE) ×1 IMPLANT
SUT VIC AB 4-0 FS2 27 (SUTURE) ×3 IMPLANT
SUTURE EHLN 3-0 FS-10 30 BLK (SUTURE) ×2 IMPLANT
SUTURE ETHLN 4-0 FS2 18XMF BLK (SUTURE) ×2 IMPLANT
SWAB CULTURE AMIES ANAERIB BLU (MISCELLANEOUS) ×3 IMPLANT
SYR 3ML LL SCALE MARK (SYRINGE) ×3 IMPLANT
SYRINGE 10CC LL (SYRINGE) ×9 IMPLANT

## 2015-02-01 NOTE — Progress Notes (Signed)
Lab called to advise positive ESBL results from foot culture.  Contact precautions initiated.  Dr. Bridgett Larsson notified.

## 2015-02-01 NOTE — Progress Notes (Signed)
Patient cooperative with care.  Had debridement of right foot ulcer by Dr. Vickki Muff.  Patient denies pain that requires medication.  Patient found to have ESBL in foot.

## 2015-02-01 NOTE — Op Note (Signed)
Operative note   Surgeon:Navpreet Szczygiel Lawyer: None    Preop diagnosis: Diabetic foot ulcer right great toe to bone    Postop diagnosis: Same    Procedure: Debridement right great toe to bone    EBL: Minimal    Anesthesia:local and IV sedation    Hemostasis: None    Specimen: Wound culture    Complications: None    Operative indications: 69 year old female admitted with a diabetic right great toe ulceration. Foul odor with surrounding necrotic tissue was noted. Patient versus debridement. I was negative for osteomyelitis and we've elected to undergo admitted for attempted limb salvage the risks benefits alternatives and competitions associated with surgery were discussed with the patient for an consent has been given    Procedure:  Patient was brought into the OR and placed on the operating table in thesupine position. After anesthesia was obtained theright lower extremity was prepped and draped in usual sterile fashion.  Attention was directed to the lateral aspect of the right great toe where a large full-thickness ulceration was noted from the tip of the toe to the webspace. This encompassed the one third of the right great toe. At this time with a versa jet I was able to full-thickness excisionally debride the necrotic and infected soft tissue down to his of bone. No purulent drainage was noted and no purulence to the webspace were noted. This was taken down to good healthy bleeding tissue. At this time a bulky sterile dressing was applied to the right great toe.    Patient tolerated the procedure and anesthesia well.  Was transported from the OR to the PACU with all vital signs stable and vascular status intact. To be discharged per routine protocol.  Will follow up in approximately 1 week in the outpatient clinic.

## 2015-02-01 NOTE — Progress Notes (Signed)
Patient underwent surgical debridement of right great toe today. We'll plan to change dressing tomorrow and patient can likely be discharged thereafter. We'll have patient follow up in outpatient clinic early next week.

## 2015-02-01 NOTE — Anesthesia Postprocedure Evaluation (Signed)
  Anesthesia Post-op Note  Patient: [name]  Procedure(s) Performed: Procedure(s): IRRIGATION AND DEBRIDEMENT FOOT/RIGHT GREAT TOE (Right)  Anesthesia type:MAC  Patient location: PACU  Post pain: Pain level controlled  Post assessment: Post-op Vital signs reviewed, Patient's Cardiovascular Status Stable, Respiratory Function Stable, Patent Airway and No signs of Nausea or vomiting  Post vital signs: Reviewed and stable  Last Vitals:  Filed Vitals:   02/01/15 1130  BP: 182/51  Pulse: 60  Temp: 36.7 C  Resp: 20    Level of consciousness: awake, alert  and patient cooperative  Complications: No apparent anesthesia complications

## 2015-02-01 NOTE — Transfer of Care (Signed)
Immediate Anesthesia Transfer of Care Note  Patient: [name]  Procedure(s) Performed: Procedure(s): IRRIGATION AND DEBRIDEMENT FOOT/RIGHT GREAT TOE (Right)  Patient Location: PACU  Anesthesia Type:MAC  Level of Consciousness: awake, alert  and oriented  Airway & Oxygen Therapy: Patient Spontanous Breathing and Patient connected to nasal cannula oxygen  Post-op Assessment: Report given to RN and Post -op Vital signs reviewed and stable  Post vital signs: Reviewed and stable  Last Vitals:  Filed Vitals:   02/01/15 1030  BP: 181/56  Pulse: 62  Temp: 37.4 C  Resp: 18    Complications: No apparent anesthesia complications

## 2015-02-01 NOTE — Progress Notes (Signed)
[place] [facility] [place] NAME: Tatum Corl    MR#:  [id number]  DATE OF BIRTH:  [birth year]  SUBJECTIVE:  CHIEF COMPLAINT:   Chief Complaint  Patient presents with  . Toe Pain   No complaint.s. S/p right great toe debridement just now. REVIEW OF SYSTEMS:  CONSTITUTIONAL: No fever, fatigue or weakness.  EYES: No blurred or double vision.  EARS, NOSE, AND THROAT: No tinnitus or ear pain.  RESPIRATORY: No cough, shortness of breath, wheezing or hemoptysis.  CARDIOVASCULAR: No chest pain, orthopnea, edema.  GASTROINTESTINAL: No nausea, vomiting, diarrhea or abdominal pain.  GENITOURINARY: No dysuria, hematuria.  ENDOCRINE: No polyuria, nocturia,  HEMATOLOGY: No anemia, easy bruising or bleeding SKIN: No rash or lesion. MUSCULOSKELETAL: No joint pain or arthritis. NEUROLOGIC: No tingling, numbness, weakness.  PSYCHIATRY: No anxiety or depression.   DRUG ALLERGIES:   Allergies  Allergen Reactions  . Codeine Other (See Comments)    Reaction:  Hallucinations    VITALS:  Blood pressure 182/51, pulse 60, temperature 98 F (36.7 C), temperature source Tympanic, resp. rate 20, height 5\' 7"  (1.702 m), weight 66.543 kg (146 lb 11.2 oz), SpO2 100 %.  PHYSICAL EXAMINATION:  GENERAL:  69 y.o.-year-old patient lying in the bed with no acute distress.  EYES: Pupils equal, round, reactive to light and accommodation. No scleral icterus. Extraocular muscles intact.  HEENT: Head atraumatic, normocephalic. Oropharynx and nasopharynx clear.  NECK:  Supple, no jugular venous distention. No thyroid enlargement, no tenderness.  LUNGS: Normal breath sounds bilaterally, no wheezing, rales,rhonchi or crepitation. No use of accessory muscles of respiration.  CARDIOVASCULAR: S1, S2 normal. No murmurs, rubs, or gallops.  ABDOMEN: Soft, nontender, nondistended. Bowel sounds present. No organomegaly or mass.  EXTREMITIES: Bilateral lower extremity edema  1+, no cyanosis, or clubbing. Right foot in dressing. NEUROLOGIC: Cranial nerves II through XII are intact. Muscle strength 5/5 in all extremities. Sensation intact. Gait not checked.  PSYCHIATRIC: The patient is alert and oriented x 3.  SKIN: No obvious rash, lesion, or ulcer.    LABORATORY PANEL:   CBC  Recent Labs Lab 01/31/15 0608  WBC 4.1  HGB 8.7*  HCT 26.4*  PLT 285   ------------------------------------------------------------------------------------------------------------------  Chemistries   Recent Labs Lab 01/30/15 0413 02/01/15 0509  NA 140  --   K 3.8  --   CL 112*  --   CO2 22  --   GLUCOSE 132*  --   BUN 36*  --   CREATININE 1.33* 1.35*  CALCIUM 7.7*  --    ------------------------------------------------------------------------------------------------------------------  Cardiac Enzymes No results for input(s): TROPONINI in the last 168 hours. ------------------------------------------------------------------------------------------------------------------  RADIOLOGY:  Mr Foot Right Wo Contrast  01/31/2015   CLINICAL DATA:  Diabetic with open wound over the great toe. Pre surgical planning. Question osteomyelitis. Initial encounter.  EXAM: MRI OF THE RIGHT FOREFOOT WITHOUT CONTRAST  TECHNIQUE: Multiplanar, multisequence MR imaging was performed. No intravenous contrast was administered.  COMPARISON:  Radiographs 01/29/2015 and 01/11/2015.  FINDINGS: Study is mildly motion degraded. No cortical destruction or suspicious marrow T2 signal demonstrated. There are mild degenerative changes of the first metatarsal phalangeal joint. There are no significant joint effusions.  There is soft tissue ulceration along the medial and plantar aspect of the proximal great toe. There are small linear foci of low signal suspicious for emphysema or small foreign bodies. These are best seen on images 24 and 25 of series 5. There are inflammatory changes within the underlying  subcutaneous fat. No focal fluid collection identified on noncontrast imaging. There is no significant tenosynovitis.  Mild atrophy of the forefoot musculature noted, attributed to diabetes. There are midfoot degenerative changes, greatest at the articulation between the middle cuneiform and second metatarsal base. There is no significant subluxation at the Lisfranc joint.  IMPRESSION: 1. Inflammatory changes in the great toe with soft tissue ulceration and possible soft tissue emphysema or small foreign bodies. No drainable abscess. 2. No evidence of osteomyelitis. 3. Midfoot degenerative changes.   Electronically Signed   By: Richardean Sale M.D.   On: 01/31/2015 18:59    EKG:   Orders placed or performed during the hospital encounter of 12/03/14  . ED EKG  . ED EKG  . EKG    ASSESSMENT AND PLAN:   #1 nonhealing diabetic ulcer with cellulitis with right great toe infection:  S/p Debridement right great toe to bone just now. MRI does not show osteomyelitis. Wound culture and blood culture: ESBL etc. discontinue vancomycin and Zosyn.  Change to imipenem and ID consult. Contact isolation.  #2 diabetes mellitus type 2: hemoglobin A1c 5.9. continue sliding scale. Hold oral hypoglycemic agents during admission.  #3 hypertension:  Continue home blood pressure medications including Lasix, hydralazine, Isordil, ramipril, propranolol.  #4 chronic kidney disease stage III: Stable.  #5 history of diastolic congestive heart failure: Stable. Continue Lasix.   Anemia of chronic disease. Hemoglobin decreased from 9.2 to 7.0. S/p 1 unit PRBC transfusion and hemoglobin is up to 8.7. No active bleeding.  I discussed with Dr. Vickki Muff. All the records are reviewed and case discussed with Care Management/Social Workerr. Management plans discussed with the patient, her son and they are in agreement.  CODE STATUS: Full code  TOTAL TIME TAKING CARE OF THIS PATIENT: 36 minutes.   POSSIBLE D/C IN 1-2  DAYS, DEPENDING ON CLINICAL CONDITION.   Demetrios Loll M.D on 02/01/2015 at 1:04 PM  Between 7am to 6pm - Pager - [phone]  After 6pm go to [email or web address] - password [email or web address] [facility]  Office  [phone]  CC: Primary care physician; Morton Peters, MD

## 2015-02-01 NOTE — Anesthesia Preprocedure Evaluation (Addendum)
Anesthesia Evaluation  Patient identified by MRN, date of birth, ID band Patient awake    Reviewed: Allergy & Precautions, NPO status , Patient's Chart, lab work & pertinent test results  Airway Mallampati: II  TM Distance: >3 FB Neck ROM: Limited    Dental  (+) Edentulous Upper, Edentulous Lower   Pulmonary former smoker,    Pulmonary exam normal breath sounds clear to auscultation       Cardiovascular Exercise Tolerance: Poor hypertension, Pt. on medications and Pt. on home beta blockers + CAD and +CHF  Normal cardiovascular exam Rhythm:Regular Rate:Normal  Angioplasty. Recent echo: EF >60%.   Neuro/Psych Sub-dural and burr hole 12/06/14. CVA    GI/Hepatic   Endo/Other  diabetes, Poorly Controlled, Type 2BG 144.  Renal/GU CRFRenal diseaseCRF stage III     Musculoskeletal   Abdominal (+)  Abdomen: soft.    Peds  Hematology  (+) anemia , Hb 8.7   Anesthesia Other Findings   Reproductive/Obstetrics                            Anesthesia Physical Anesthesia Plan  ASA: IV  Anesthesia Plan: MAC   Post-op Pain Management:    Induction: Intravenous  Airway Management Planned:   Additional Equipment:   Intra-op Plan:   Post-operative Plan:   Informed Consent: I have reviewed the patients History and Physical, chart, labs and discussed the procedure including the risks, benefits and alternatives for the proposed anesthesia with the patient or authorized representative who has indicated his/her understanding and acceptance.     Plan Discussed with: CRNA  Anesthesia Plan Comments: (MAC or GA, LMA, depending on extent of surgery. Fit for either.)       Anesthesia Quick Evaluation

## 2015-02-01 NOTE — Progress Notes (Signed)
Notified Dr Reece Levy of pt elevated BP. Per MD give am dose of BP med.

## 2015-02-01 NOTE — Progress Notes (Signed)
ANTIBIOTIC CONSULT NOTE - INITIAL  Pharmacy Consult for Imipenem Indication: ESBL Wound Infection  Allergies  Allergen Reactions  . Codeine Other (See Comments)    Reaction:  Hallucinations    Patient Measurements: Height: 5\' 7"  (170.2 cm) Weight: 146 lb 11.2 oz (66.543 kg) IBW/kg (Calculated) : 61.6   Vital Signs: Temp: 97.8 F (36.6 C) (09/30 1325) Temp Source: Tympanic (09/30 1030) BP: 134/64 mmHg (09/30 1325) Pulse Rate: 62 (09/30 1325) Intake/Output from previous day: 09/29 0701 - 09/30 0700 In: -  Out: 450 [Urine:450] Intake/Output from this shift: Total I/O In: 1200 [I.V.:200; IV Piggyback:1000] Out: 250 [Urine:250]  Labs:  Recent Labs  01/29/15 1606 01/30/15 0413 01/31/15 0608 02/01/15 0509  WBC 3.3* 3.5* 4.1  --   HGB 7.0* 7.0* 8.7*  --   PLT 251 251 285  --   CREATININE 1.38* 1.33*  --  1.35*   Estimated Creatinine Clearance: 38.2 mL/min (by C-G formula based on Cr of 1.35). No results for input(s): VANCOTROUGH, VANCOPEAK, VANCORANDOM, GENTTROUGH, GENTPEAK, GENTRANDOM, TOBRATROUGH, TOBRAPEAK, TOBRARND, AMIKACINPEAK, AMIKACINTROU, AMIKACIN in the last 72 hours.   Microbiology: Recent Results (from the past 720 hour(s))  Blood culture (routine x 2)     Status: None (Preliminary result)   Collection Time: 01/29/15  4:06 PM  Result Value Ref Range Status   Specimen Description BLOOD LEFT ARM  Final   Special Requests BOTTLES DRAWN AEROBIC AND ANAEROBIC 2CC  Final   Culture  Setup Time   Final    GRAM POSITIVE COCCI AEROBIC BOTTLE ONLY CRITICAL RESULT CALLED TO, READ BACK BY AND VERIFIED WITH: TERESA COBLE AT 2150 01/30/15 SDR CONFIRMED BY AJO    Culture   Final    COAGULASE NEGATIVE STAPHYLOCOCCUS AEROBIC BOTTLE ONLY Results consistent with contamination.    Report Status PENDING  Incomplete  Wound culture     Status: None (Preliminary result)   Collection Time: 01/29/15  5:15 PM  Result Value Ref Range Status   Specimen Description TOE   RIGHT  Final   Special Requests NONE  Final   Gram Stain   Final    FEW WBC SEEN RARE GRAM POSITIVE COCCI IN PAIRS RARE GRAM NEGATIVE RODS    Culture   Final    HEAVY GROWTH ESCHERICHIA COLI MODERATE GROWTH PROVIDENCIA STUARTII LIGHT GROWTH MORGANELLA MORGANII MODERATE GROWTH ENTEROCOCCUS FAECALIS ESBL-EXTENDED SPECTRUM BETA LACTAMASE-THE ORGANISM IS RESISTANT TO PENICILLINS, CEPHALOSPORINS AND AZTREONAM ACCORDING TO CLSI M100-S15 VOL.[place].    Report Status PENDING  Incomplete   Organism ID, Bacteria ESCHERICHIA COLI  Final   Organism ID, Bacteria PROVIDENCIA STUARTII  Final   Organism ID, Bacteria ENTEROCOCCUS FAECALIS  Final   Organism ID, Bacteria MORGANELLA MORGANII  Final      Susceptibility   Escherichia coli - MIC*    AMPICILLIN >=32 RESISTANT Resistant     CEFTAZIDIME 4 RESISTANT Resistant     CEFAZOLIN >=64 RESISTANT Resistant     CEFTRIAXONE >=64 RESISTANT Resistant     CIPROFLOXACIN >=4 RESISTANT Resistant     GENTAMICIN <=1 SENSITIVE Sensitive     IMIPENEM <=0.25 SENSITIVE Sensitive     TRIMETH/SULFA >=320 RESISTANT Resistant     Extended ESBL POSITIVE Resistant     PIP/TAZO Value in next row Sensitive      SENSITIVE8    * HEAVY GROWTH ESCHERICHIA COLI   Morganella morganii - MIC*    AMPICILLIN Value in next row Resistant      RESISTANT>=32    CEFTAZIDIME  Value in next row Resistant      RESISTANT>=64    CEFEPIME Value in next row Sensitive      SENSITIVE<=1    CEFAZOLIN Value in next row Resistant      RESISTANT>=64    CEFTRIAXONE Value in next row Sensitive      SENSITIVE8    CIPROFLOXACIN Value in next row Resistant      RESISTANT>=4    GENTAMICIN Value in next row Resistant      RESISTANT>=16    IMIPENEM Value in next row Sensitive      SENSITIVE1    TRIMETH/SULFA Value in next row Resistant      RESISTANT>=320    PIP/TAZO Value in next row Sensitive      SENSITIVE8    * LIGHT GROWTH MORGANELLA MORGANII   Enterococcus faecalis - MIC*     AMPICILLIN Value in next row Sensitive      SENSITIVE8    LINEZOLID Value in next row Sensitive      SENSITIVE8    * MODERATE GROWTH ENTEROCOCCUS FAECALIS   Providencia stuartii - MIC*    AMPICILLIN Value in next row Resistant      SENSITIVE8    CEFTAZIDIME Value in next row Sensitive      SENSITIVE8    CEFAZOLIN Value in next row Resistant      SENSITIVE8    CEFTRIAXONE Value in next row Sensitive      SENSITIVE8    CIPROFLOXACIN Value in next row Resistant      SENSITIVE8    GENTAMICIN Value in next row Resistant      SENSITIVE8    IMIPENEM Value in next row Sensitive      SENSITIVE8    TRIMETH/SULFA Value in next row Resistant      SENSITIVE8    PIP/TAZO Value in next row Sensitive      SENSITIVE<=4    * MODERATE GROWTH PROVIDENCIA STUARTII  Blood culture (routine x 2)     Status: None (Preliminary result)   Collection Time: 01/29/15  6:10 PM  Result Value Ref Range Status   Specimen Description BLOOD RIGHT HAND  Final   Special Requests   Final    BOTTLES DRAWN AEROBIC AND ANAEROBIC  AER 1CC ANA 5CC   Culture NO GROWTH 2 DAYS  Final   Report Status PENDING  Incomplete  Surgical pcr screen     Status: None   Collection Time: 01/31/15 10:50 PM  Result Value Ref Range Status   MRSA, PCR NEGATIVE NEGATIVE Final   Staphylococcus aureus NEGATIVE NEGATIVE Final    Comment:        The Xpert SA Assay (FDA approved for NASAL specimens in patients over 21 years of age), is one component of a comprehensive surveillance program.  Test performance has been validated by [facility] for patients greater than or equal to 78 year old. It is not intended to diagnose infection nor to guide or monitor treatment.     Medical History: Past Medical History  Diagnosis Date  . Hypertension   . Diabetes mellitus without complication   . Anemia   . Coronary artery disease   . CHF (congestive heart failure)   . Glaucoma   . Hyperlipidemia   . Chronic kidney disease   .  Neuropathy   . Chronic anemia   . Stroke     Medications:  Scheduled:  . docusate sodium  100 mg Oral BID  . ferrous sulfate  325 mg  Oral Daily  . furosemide  40 mg Oral BID  . hydrALAZINE  100 mg Oral TID  . imipenem-cilastatin  500 mg Intravenous 3 times per day  . insulin aspart  0-5 Units Subcutaneous QHS  . insulin aspart  0-9 Units Subcutaneous TID WC  . isosorbide dinitrate  30 mg Oral BID  . pantoprazole  40 mg Oral Daily  . potassium chloride SA  20 mEq Oral Daily  . propranolol  40 mg Oral TID  . ramipril  10 mg Oral Daily  . ramipril  2.5 mg Oral Once  . simvastatin  20 mg Oral QHS   Assessment: Pharmacy consulted to dose Imipenem in a 69 yo female with infected right great toe.  Patient went for irrigation and debridement today and intraop cultures obtained.  WCx: growing multiple organisms (See above) including ESBL E. Coli.  SCr: 1.35, est CrCl~38.2 mL/min   Goal of Therapy:  Resolution of infection  Plan:  Will order Imipenem/cilastatin 500 mg IV q8h based on weight and renal function (Adjustment from 1 gm IV q8h).  Follow up culture results  Pharmacy will continue to follow.  Murrell Converse, PharmD Clinical Pharmacist 02/01/2015

## 2015-02-01 NOTE — Progress Notes (Signed)
ANTIBIOTIC CONSULT NOTE - INITIAL  Pharmacy Consult for Meropenem Indication: ESBL Wound Infection  Allergies  Allergen Reactions  . Codeine Other (See Comments)    Reaction:  Hallucinations    Patient Measurements: Height: 5\' 7"  (170.2 cm) Weight: 146 lb 11.2 oz (66.543 kg) IBW/kg (Calculated) : 61.6   Vital Signs: Temp: 98.2 F (36.8 C) (09/30 1421) Temp Source: Tympanic (09/30 1030) BP: 122/58 mmHg (09/30 1421) Pulse Rate: 60 (09/30 1421) Intake/Output from previous day: 09/29 0701 - 09/30 0700 In: -  Out: 450 [Urine:450] Intake/Output from this shift: Total I/O In: 1200 [I.V.:200; IV Piggyback:1000] Out: 250 [Urine:250]  Labs:  Recent Labs  01/29/15 1606 01/30/15 0413 01/31/15 0608 02/01/15 0509  WBC 3.3* 3.5* 4.1  --   HGB 7.0* 7.0* 8.7*  --   PLT 251 251 285  --   CREATININE 1.38* 1.33*  --  1.35*   Estimated Creatinine Clearance: 38.2 mL/min (by C-G formula based on Cr of 1.35). No results for input(s): VANCOTROUGH, VANCOPEAK, VANCORANDOM, GENTTROUGH, GENTPEAK, GENTRANDOM, TOBRATROUGH, TOBRAPEAK, TOBRARND, AMIKACINPEAK, AMIKACINTROU, AMIKACIN in the last 72 hours.   Microbiology: Recent Results (from the past 720 hour(s))  Blood culture (routine x 2)     Status: None (Preliminary result)   Collection Time: 01/29/15  4:06 PM  Result Value Ref Range Status   Specimen Description BLOOD LEFT ARM  Final   Special Requests BOTTLES DRAWN AEROBIC AND ANAEROBIC 2CC  Final   Culture  Setup Time   Final    GRAM POSITIVE COCCI AEROBIC BOTTLE ONLY CRITICAL RESULT CALLED TO, READ BACK BY AND VERIFIED WITH: TERESA COBLE AT 2150 01/30/15 SDR CONFIRMED BY AJO    Culture   Final    COAGULASE NEGATIVE STAPHYLOCOCCUS AEROBIC BOTTLE ONLY Results consistent with contamination.    Report Status PENDING  Incomplete  Wound culture     Status: None (Preliminary result)   Collection Time: 01/29/15  5:15 PM  Result Value Ref Range Status   Specimen Description TOE   RIGHT  Final   Special Requests NONE  Final   Gram Stain   Final    FEW WBC SEEN RARE GRAM POSITIVE COCCI IN PAIRS RARE GRAM NEGATIVE RODS    Culture   Final    HEAVY GROWTH ESCHERICHIA COLI MODERATE GROWTH PROVIDENCIA STUARTII LIGHT GROWTH MORGANELLA MORGANII MODERATE GROWTH ENTEROCOCCUS FAECALIS ESBL-EXTENDED SPECTRUM BETA LACTAMASE-THE ORGANISM IS RESISTANT TO PENICILLINS, CEPHALOSPORINS AND AZTREONAM ACCORDING TO CLSI M100-S15 VOL.[place].    Report Status PENDING  Incomplete   Organism ID, Bacteria ESCHERICHIA COLI  Final   Organism ID, Bacteria PROVIDENCIA STUARTII  Final   Organism ID, Bacteria ENTEROCOCCUS FAECALIS  Final   Organism ID, Bacteria MORGANELLA MORGANII  Final      Susceptibility   Escherichia coli - MIC*    AMPICILLIN >=32 RESISTANT Resistant     CEFTAZIDIME 4 RESISTANT Resistant     CEFAZOLIN >=64 RESISTANT Resistant     CEFTRIAXONE >=64 RESISTANT Resistant     CIPROFLOXACIN >=4 RESISTANT Resistant     GENTAMICIN <=1 SENSITIVE Sensitive     IMIPENEM <=0.25 SENSITIVE Sensitive     TRIMETH/SULFA >=320 RESISTANT Resistant     Extended ESBL POSITIVE Resistant     PIP/TAZO Value in next row Sensitive      SENSITIVE8    * HEAVY GROWTH ESCHERICHIA COLI   Morganella morganii - MIC*    AMPICILLIN Value in next row Resistant      RESISTANT>=32    CEFTAZIDIME  Value in next row Resistant      RESISTANT>=64    CEFEPIME Value in next row Sensitive      SENSITIVE<=1    CEFAZOLIN Value in next row Resistant      RESISTANT>=64    CEFTRIAXONE Value in next row Sensitive      SENSITIVE8    CIPROFLOXACIN Value in next row Resistant      RESISTANT>=4    GENTAMICIN Value in next row Resistant      RESISTANT>=16    IMIPENEM Value in next row Sensitive      SENSITIVE1    TRIMETH/SULFA Value in next row Resistant      RESISTANT>=320    PIP/TAZO Value in next row Sensitive      SENSITIVE8    * LIGHT GROWTH MORGANELLA MORGANII   Enterococcus faecalis - MIC*     AMPICILLIN Value in next row Sensitive      SENSITIVE8    LINEZOLID Value in next row Sensitive      SENSITIVE8    * MODERATE GROWTH ENTEROCOCCUS FAECALIS   Providencia stuartii - MIC*    AMPICILLIN Value in next row Resistant      SENSITIVE8    CEFTAZIDIME Value in next row Sensitive      SENSITIVE8    CEFAZOLIN Value in next row Resistant      SENSITIVE8    CEFTRIAXONE Value in next row Sensitive      SENSITIVE8    CIPROFLOXACIN Value in next row Resistant      SENSITIVE8    GENTAMICIN Value in next row Resistant      SENSITIVE8    IMIPENEM Value in next row Sensitive      SENSITIVE8    TRIMETH/SULFA Value in next row Resistant      SENSITIVE8    PIP/TAZO Value in next row Sensitive      SENSITIVE<=4    * MODERATE GROWTH PROVIDENCIA STUARTII  Blood culture (routine x 2)     Status: None (Preliminary result)   Collection Time: 01/29/15  6:10 PM  Result Value Ref Range Status   Specimen Description BLOOD RIGHT HAND  Final   Special Requests   Final    BOTTLES DRAWN AEROBIC AND ANAEROBIC  AER 1CC ANA 5CC   Culture NO GROWTH 2 DAYS  Final   Report Status PENDING  Incomplete  Surgical pcr screen     Status: None   Collection Time: 01/31/15 10:50 PM  Result Value Ref Range Status   MRSA, PCR NEGATIVE NEGATIVE Final   Staphylococcus aureus NEGATIVE NEGATIVE Final    Comment:        The Xpert SA Assay (FDA approved for NASAL specimens in patients over 47 years of age), is one component of a comprehensive surveillance program.  Test performance has been validated by [facility] for patients greater than or equal to 31 year old. It is not intended to diagnose infection nor to guide or monitor treatment.   Gram stain     Status: None   Collection Time: 02/01/15 10:00 AM  Result Value Ref Range Status   Specimen Description TOE  Final   Special Requests NONE  Final   Report Status 02/01/2015 FINAL  Final  Wound culture     Status: None   Collection Time: 02/01/15  10:00 AM  Result Value Ref Range Status   Specimen Description TOE  Final   Special Requests NONE  Final   Report Status 02/01/2015 FINAL  Final  Medical History: Past Medical History  Diagnosis Date  . Hypertension   . Diabetes mellitus without complication   . Anemia   . Coronary artery disease   . CHF (congestive heart failure)   . Glaucoma   . Hyperlipidemia   . Chronic kidney disease   . Neuropathy   . Chronic anemia   . Stroke     Medications:  Scheduled:  . docusate sodium  100 mg Oral BID  . ferrous sulfate  325 mg Oral Daily  . furosemide  40 mg Oral BID  . hydrALAZINE  100 mg Oral TID  . insulin aspart  0-5 Units Subcutaneous QHS  . insulin aspart  0-9 Units Subcutaneous TID WC  . isosorbide dinitrate  30 mg Oral BID  . meropenem (MERREM) IV  1 g Intravenous Q12H  . pantoprazole  40 mg Oral Daily  . potassium chloride SA  20 mEq Oral Daily  . propranolol  40 mg Oral TID  . ramipril  10 mg Oral Daily  . ramipril  2.5 mg Oral Once  . simvastatin  20 mg Oral QHS   Assessment: Pharmacy consulted to dose Meropenem in a 69 yo female with infected right great toe.  Patient went for irrigation and debridement today and intraop cultures obtained.  WCx: growing multiple organisms (See above) including ESBL E. Coli.  SCr: 1.35, est CrCl~38.2 mL/min  Patient has received 1 dose of imipenem at 1536  Goal of Therapy:  Resolution of infection  Plan:  Will order Meropenem 1 gm IV q12h  renal function. (Adjustment from 1 gm IV q8h).  Follow up culture results  Pharmacy will continue to follow.  Murrell Converse, PharmD Clinical Pharmacist 02/01/2015

## 2015-02-02 ENCOUNTER — Inpatient Hospital Stay: Payer: Medicare HMO

## 2015-02-02 DIAGNOSIS — N183 Chronic kidney disease, stage 3 unspecified: Secondary | ICD-10-CM

## 2015-02-02 DIAGNOSIS — D638 Anemia in other chronic diseases classified elsewhere: Secondary | ICD-10-CM

## 2015-02-02 DIAGNOSIS — L03031 Cellulitis of right toe: Secondary | ICD-10-CM

## 2015-02-02 DIAGNOSIS — E119 Type 2 diabetes mellitus without complications: Secondary | ICD-10-CM

## 2015-02-02 LAB — CBC
HEMATOCRIT: 27.4 % — AB (ref 35.0–47.0)
HEMOGLOBIN: 9.1 g/dL — AB (ref 12.0–16.0)
MCH: 28.8 pg (ref 26.0–34.0)
MCHC: 33.2 g/dL (ref 32.0–36.0)
MCV: 86.8 fL (ref 80.0–100.0)
Platelets: 296 10*3/uL (ref 150–440)
RBC: 3.16 MIL/uL — AB (ref 3.80–5.20)
RDW: 18.6 % — ABNORMAL HIGH (ref 11.5–14.5)
WBC: 3.2 10*3/uL — ABNORMAL LOW (ref 3.6–11.0)

## 2015-02-02 LAB — CULTURE, BLOOD (ROUTINE X 2)

## 2015-02-02 LAB — BASIC METABOLIC PANEL
ANION GAP: 6 (ref 5–15)
BUN: 32 mg/dL — ABNORMAL HIGH (ref 6–20)
CALCIUM: 7.7 mg/dL — AB (ref 8.9–10.3)
CO2: 23 mmol/L (ref 22–32)
Chloride: 110 mmol/L (ref 101–111)
Creatinine, Ser: 1.37 mg/dL — ABNORMAL HIGH (ref 0.44–1.00)
GFR, EST AFRICAN AMERICAN: 44 mL/min — AB (ref >60)
GFR, EST NON AFRICAN AMERICAN: 38 mL/min — AB (ref >60)
Glucose, Bld: 180 mg/dL — ABNORMAL HIGH (ref 65–99)
POTASSIUM: 3.2 mmol/L — AB (ref 3.5–5.1)
Sodium: 139 mmol/L (ref 135–145)

## 2015-02-02 LAB — WOUND CULTURE

## 2015-02-02 LAB — GLUCOSE, CAPILLARY
GLUCOSE-CAPILLARY: 125 mg/dL — AB (ref 65–99)
Glucose-Capillary: 150 mg/dL — ABNORMAL HIGH (ref 65–99)
Glucose-Capillary: 152 mg/dL — ABNORMAL HIGH (ref 65–99)

## 2015-02-02 LAB — IRON AND TIBC: IRON: 29 ug/dL (ref 28–170)

## 2015-02-02 LAB — FERRITIN: FERRITIN: 624 ng/mL — AB (ref 11–307)

## 2015-02-02 MED ORDER — AMOXICILLIN 500 MG PO CAPS: 500.0000 mg | ORAL_CAPSULE | Freq: Three times a day (TID) | ORAL | Status: DC

## 2015-02-02 MED ORDER — AMOXICILLIN 500 MG PO CAPS
500.0000 mg | ORAL_CAPSULE | Freq: Three times a day (TID) | ORAL | Status: DC
  Administered 2015-02-02: 500 mg via ORAL
  Filled 2015-02-02: qty 1

## 2015-02-02 MED ORDER — SODIUM CHLORIDE 0.9 % IV SOLN: 1.0000 g | Freq: Two times a day (BID) | INTRAVENOUS | Status: DC

## 2015-02-02 MED ORDER — HYDROCODONE-ACETAMINOPHEN 5-325 MG PO TABS: 1.0000 | ORAL_TABLET | ORAL | Status: DC | PRN

## 2015-02-02 MED ORDER — AMPICILLIN 500 MG PO CAPS: 500.0000 mg | ORAL_CAPSULE | Freq: Three times a day (TID) | ORAL | Status: DC

## 2015-02-02 NOTE — Plan of Care (Signed)
Problem: Phase II Progression Outcomes Goal: Progress activity as tolerated unless otherwise ordered Outcome: Progressing Up to bedside commode with assistance Goal: IV changed to normal saline lock Outcome: Completed/Met Date Met:  02/02/15 No iv fluids running at this time. Goal: Obtain order to discontinue catheter if appropriate Outcome: Completed/Met Date Met:  02/02/15 Does not require foley catheter.  Problem: Phase III Progression Outcomes Goal: Pain controlled on oral analgesia Outcome: Completed/Met Date Met:  02/02/15 Pain control with oral pain medication Goal: Foley discontinued Outcome: Completed/Met Date Met:  02/02/15 No Foley catheter at this time

## 2015-02-02 NOTE — Discharge Summary (Signed)
[facility] NAME: [name]    MR#:  [id number]  DATE OF BIRTH:  Apr 22, 1946  DATE OF ADMISSION:  01/29/2015 ADMITTING PHYSICIAN: Aldean Jewett, MD  DATE OF DISCHARGE: No discharge date for patient encounter.  PRIMARY CARE PHYSICIAN: Morton Peters, MD     ADMISSION DIAGNOSIS:  Swelling [R60.9] Toe infection [L08.9]  DISCHARGE DIAGNOSIS:  Principal Problem:   Cellulitis of great toe of right foot Active Problems:   Infected wound   Anemia   Anemia of chronic disease   Diabetes mellitus ([place])   CKD (chronic kidney disease), stage III   SECONDARY DIAGNOSIS:   Past Medical History  Diagnosis Date  . Hypertension   . Diabetes mellitus without complication   . Anemia   . Coronary artery disease   . CHF (congestive heart failure)   . Glaucoma   . Hyperlipidemia   . Chronic kidney disease   . Neuropathy   . Chronic anemia   . Stroke     .pro HOSPITAL COURSE:   Patient is a 69 year old female with history of diabetic foot ulcer, who presents to the hospital on 01/29/2015 with right great toe pain. Apparently patient has been following up with wound care clinic and had multiple debridements of great toe, however, wound did not heal well and developed erythema, edema, so she presented emergency room for further evaluation. On arrival to the hospital patient was noted to be anemic with hemoglobin level of 7.0, however, cell count was also low at creatinine was 1.33. Patient was transfused 1 unit of packed red blood cells after which hemoglobin level improved. No bleeding was noted. She was seen by Dr. Vickki Muff,  podiatrist, and patient underwent surgical debridement of right great toe on 02/01/2015. Debridement was obtained down to the bone. No purulent discharge was noted. Of note, patient did have an MRI of right foot without contrast 29th of September 2016 which revealed inflammatory changes and great  toe with soft tissue ulceration and possible soft tissue emphysema with small foreign bodies but no drainable abscesses or evidence of osteomyelitis. Patient was continued on antibiotic therapy. Her wound cultures came back positive for multiple bacteria including Escherichia coli, ESBL,  Providencia stuartii, and Enterococcus faecalis and Morganella Morganii. PICC line was placed and patient was initiated on meropenem intravenously for all bacteria but Enterococcus faecalis, for which patient was initiated on ampicillin orally. Patient is to follow-up with primary care physician as well as podiatrist, Dr. Vickki Muff and Dr. Ola Spurr infectious disease specialist in the next 1 week after discharge. Home health services will be obtained for patient upon discharge. Patient was evaluated by physical therapist and recommended home health physical therapy.   Discussion by problem  #1 nonhealing diabetic ulcer with cellulitis:   MRI of the foot to rule out osteomyelitis. Patient underwent debridement of the wound to the bone by Dr. Vickki Muff.   Blood culture 1 out of 4 collected 27th of September 2016 revealed coagulase-negative Staphylococcus, likely contamination , patient was managed on vancomycin and Zosyn while in the hospital. However, now since cultures are known , now with multiple bacterial agents,  We will be placing a PICC line and initiate patient on meropenem intravenously,  discussed with Dr. Ola Spurr via text messaging,  very likely patient will need just meropenem, no need for ampicillin orally.  Patient is to follow-up with Dr. Ola Spurr in the next few days after discharge.   #2 diabetes mellitus  type 2: hemoglobin A1c 5.9. continue sliding scale. Resume oral hypoglycemic agents after discharge  #3 hypertension: Continue home blood pressure medications including Lasix, hydralazine, Isordil, ramipril, propranolol.  #4 chronic kidney disease stage III: Stable.  #5 history of diastolic  congestive heart failure: Stable. Continue Lasix.   #6 Anemia of chronic disease, although drop is significant.Hemoglobin decreased from 9.2 to 7.0. She was transfused 1 unit PRBC and hemoglobin level has improved. No bleeding was noted, but aren't studies will be unreliable since patient was transfused. Patient would benefit from gastroenterologist evaluation as outpatient  DISCHARGE CONDITIONS:   Stable  CONSULTS OBTAINED:  Treatment Team:  [name], DPM  DRUG ALLERGIES:   Allergies  Allergen Reactions  . Codeine Other (See Comments)    Reaction:  Hallucinations    DISCHARGE MEDICATIONS:   Current Discharge Medication List    START taking these medications   Details  amoxicillin (AMOXIL) 500 MG capsule Take 1 capsule (500 mg total) by mouth every 8 (eight) hours. Qty: 42 capsule, Refills: 1    HYDROcodone-acetaminophen (NORCO/VICODIN) 5-325 MG tablet Take 1-2 tablets by mouth every 4 (four) hours as needed for moderate pain. Qty: 30 tablet, Refills: 0    meropenem 1 g in sodium chloride 0.9 % 100 mL Inject 1 g into the vein every 12 (twelve) hours. Qty: 28 Dose, Refills: 1      CONTINUE these medications which have NOT CHANGED   Details  acetaminophen (TYLENOL) 325 MG tablet Take 2 tablets (650 mg total) by mouth every 4 (four) hours as needed for mild pain (or temp > 99 F).    docusate sodium (COLACE) 100 MG capsule Take 100 mg by mouth 2 (two) times daily.    !! feeding supplement, GLUCERNA SHAKE, (GLUCERNA SHAKE) LIQD Take 237 mLs by mouth 3 (three) times daily between meals. Refills: 0    ferrous sulfate 325 (65 FE) MG tablet Take 325 mg by mouth daily.    furosemide (LASIX) 40 MG tablet Take 1 tablet (40 mg total) by mouth 2 (two) times daily. Qty: 60 tablet, Refills: 5    glimepiride (AMARYL) 2 MG tablet Take 1 mg by mouth daily.    hydrALAZINE (APRESOLINE) 100 MG tablet Take 100 mg by mouth 3 (three) times daily.     insulin aspart (NOVOLOG) 100  UNIT/ML injection Inject 0-9 Units into the skin 3 (three) times daily with meals. Qty: 10 mL, Refills: 11    isosorbide dinitrate (ISORDIL) 30 MG tablet Take 30 mg by mouth 2 (two) times daily.    nitroGLYCERIN (NITROSTAT) 0.4 MG SL tablet Place 0.4 mg under the tongue every 5 (five) minutes as needed for chest pain.    Omega-3 Fatty Acids (FISH OIL) 1000 MG CAPS Take 1,000 mg by mouth daily.     omeprazole (PRILOSEC) 20 MG capsule Take 20 mg by mouth daily.    potassium chloride SA (K-DUR,KLOR-CON) 20 MEQ tablet Take 1 tablet (20 mEq total) by mouth daily. Qty: 30 tablet, Refills: 5    propranolol (INDERAL) 20 MG tablet Take 40 mg by mouth 3 (three) times daily.    ramipril (ALTACE) 5 MG capsule Take 5 mg by mouth daily.    simvastatin (ZOCOR) 20 MG tablet Take 20 mg by mouth at bedtime.    traMADol (ULTRAM) 50 MG tablet Take 0.5-1 tablets (25-50 mg total) by mouth 3 (three) times daily as needed for moderate pain. Qty: 10 tablet, Refills: 0    !! feeding supplement, GLUCERNA SHAKE, (  GLUCERNA SHAKE) LIQD Take 237 mLs by mouth 2 (two) times daily between meals. Qty: 60 Can, Refills: 0     !! - Potential duplicate medications found. Please discuss with provider.    STOP taking these medications     amoxicillin-clavulanate (AUGMENTIN) 875-125 MG tablet          DISCHARGE INSTRUCTIONS:    Patient is to follow-up with multiple physicians including primary care physician, Dr. Hardin Negus, podiatrist, Dr. Vickki Muff, infectious disease specialist, Dr. Ola Spurr, home health nurse as well as physical therapy  If you experience worsening of your admission symptoms, develop shortness of breath, life threatening emergency, suicidal or homicidal thoughts you must seek medical attention immediately by calling 911 or calling your MD immediately  if symptoms less severe.  You Must read complete instructions/literature along with all the possible adverse reactions/side effects for all the  Medicines you take and that have been prescribed to you. Take any new Medicines after you have completely understood and accept all the possible adverse reactions/side effects.   Please note  You were cared for by a hospitalist during your hospital stay. If you have any questions about your discharge medications or the care you received while you were in the hospital after you are discharged, you can call the unit and asked to speak with the hospitalist on call if the hospitalist that took care of you is not available. Once you are discharged, your primary care physician will handle any further medical issues. Please note that NO REFILLS for any discharge medications will be authorized once you are discharged, as it is imperative that you return to your primary care physician (or establish a relationship with a primary care physician if you do not have one) for your aftercare needs so that they can reassess your need for medications and monitor your lab values.    Today   CHIEF COMPLAINT:   Chief Complaint  Patient presents with  . Toe Pain    HISTORY OF PRESENT ILLNESS:  [name]  is a 69 y.o. female with a known history of diabetes. CK D CHF, hypertension who presents to  the hospital on 01/29/2015 with right great toe pain. Apparently patient has been following up with wound care clinic and had multiple debridements of great toe, however, wound did not heal well and developed erythema, edema, so she presented emergency room for further evaluation. On arrival to the hospital patient was noted to be anemic with hemoglobin level of 7.0, however, cell count was also low at creatinine was 1.33. Patient was transfused 1 unit of packed red blood cells after which hemoglobin level improved. No bleeding was noted. She was seen by Dr. Vickki Muff,  podiatrist, and patient underwent surgical debridement of right great toe on 02/01/2015. Debridement was obtained down to the bone. No purulent discharge was  noted. Of note, patient did have an MRI of right foot without contrast 29th of September 2016 which revealed inflammatory changes and great toe with soft tissue ulceration and possible soft tissue emphysema with small foreign bodies but no drainable abscesses or evidence of osteomyelitis. Patient was continued on antibiotic therapy. Her wound cultures came back positive for multiple bacteria including Escherichia coli, ESBL,  Providencia stuartii, and Enterococcus faecalis and Morganella Morganii. PICC line was placed and patient was initiated on meropenem intravenously for all bacteria but Enterococcus faecalis, for which patient was initiated on ampicillin orally. Patient is to follow-up with primary care physician as well as podiatrist, Dr. Vickki Muff and Dr.  Fitzgerald infectious disease specialist in the next 1 week after discharge. Home health services will be obtained for patient upon discharge. Patient was evaluated by physical therapist and recommended home health physical therapy.   Discussion by problem  #1 nonhealing diabetic ulcer with cellulitis:   MRI of the foot to rule out osteomyelitis. Patient underwent debridement of the wound to the bone by Dr. Vickki Muff.   Blood culture 1 out of 4 collected 27th of September 2016 revealed coagulase-negative Staphylococcus, likely contamination , patient was managed on vancomycin and Zosyn while in the hospital. However, now since cultures are known , now with multiple bacterial agents,  We will be placing a PICC line and initiate patient on meropenem intravenously,  discussed with Dr. Ola Spurr via text messaging,  very likely patient will need just meropenem, no need for ampicillin orally.  Patient is to follow-up with Dr. Ola Spurr in the next few days after discharge.   #2 diabetes mellitus type 2: hemoglobin A1c 5.9. continue sliding scale. Resume oral hypoglycemic agents after discharge  #3 hypertension: Continue home blood pressure medications  including Lasix, hydralazine, Isordil, ramipril, propranolol.  #4 chronic kidney disease stage III: Stable.  #5 history of diastolic congestive heart failure: Stable. Continue Lasix.   #6 Anemia of chronic disease, although drop is significant.Hemoglobin decreased from 9.2 to 7.0. She was transfused 1 unit PRBC and hemoglobin level has improved. No bleeding was noted, but aren't studies will be unreliable since patient was transfused. Patient would benefit from gastroenterologist evaluation as outpatient     VITAL SIGNS:  Blood pressure 192/58, pulse 63, temperature 98.6 F (37 C), temperature source Oral, resp. rate 18, height 5\' 7"  (1.702 m), weight 67.132 kg (148 lb), SpO2 97 %.  I/O:   Intake/Output Summary (Last 24 hours) at 02/02/15 1321 Last data filed at 02/02/15 1132  Gross per 24 hour  Intake    100 ml  Output   1350 ml  Net  -1250 ml    PHYSICAL EXAMINATION:  GENERAL:  69 y.o.-year-old patient lying in the bed with no acute distress.  EYES: Pupils equal, round, reactive to light and accommodation. No scleral icterus. Extraocular muscles intact.  HEENT: Head atraumatic, normocephalic. Oropharynx and nasopharynx clear.  NECK:  Supple, no jugular venous distention. No thyroid enlargement, no tenderness.  LUNGS: Normal breath sounds bilaterally, no wheezing, rales,rhonchi or crepitation. No use of accessory muscles of respiration.  CARDIOVASCULAR: S1, S2 normal. No murmurs, rubs, or gallops.  ABDOMEN: Soft, non-tender, non-distended. Bowel sounds present. No organomegaly or mass.  EXTREMITIES: No pedal edema, cyanosis, or clubbing.  NEUROLOGIC: Cranial nerves II through XII are intact. Muscle strength 5/5 in all extremities. Sensation intact. Gait not checked.  PSYCHIATRIC: The patient is alert and oriented x 3.  SKIN: No obvious rash, lesion, or ulcer.   DATA REVIEW:   CBC  Recent Labs Lab 02/02/15 0431  WBC 3.2*  HGB 9.1*  HCT 27.4*  PLT 296    Chemistries    Recent Labs Lab 02/02/15 0431  NA 139  K 3.2*  CL 110  CO2 23  GLUCOSE 180*  BUN 32*  CREATININE 1.37*  CALCIUM 7.7*    Cardiac Enzymes No results for input(s): TROPONINI in the last 168 hours.  Microbiology Results  Results for orders placed or performed during the hospital encounter of 01/29/15  Blood culture (routine x 2)     Status: None   Collection Time: 01/29/15  4:06 PM  Result Value Ref Range Status  Specimen Description BLOOD LEFT ARM  Final   Special Requests BOTTLES DRAWN AEROBIC AND ANAEROBIC 2CC  Final   Culture  Setup Time   Final    GRAM POSITIVE COCCI AEROBIC BOTTLE ONLY CRITICAL RESULT CALLED TO, READ BACK BY AND VERIFIED WITH: TERESA COBLE AT 2150 01/30/15 SDR CONFIRMED BY AJO    Culture   Final    COAGULASE NEGATIVE STAPHYLOCOCCUS AEROBIC BOTTLE ONLY Results consistent with contamination.    Report Status 02/02/2015 FINAL  Final  Wound culture     Status: None   Collection Time: 01/29/15  5:15 PM  Result Value Ref Range Status   Specimen Description TOE  RIGHT  Final   Special Requests NONE  Final   Gram Stain   Final    FEW WBC SEEN RARE GRAM POSITIVE COCCI IN PAIRS RARE GRAM NEGATIVE RODS    Culture   Final    HEAVY GROWTH ESCHERICHIA COLI MODERATE GROWTH PROVIDENCIA STUARTII LIGHT GROWTH MORGANELLA MORGANII MODERATE GROWTH ENTEROCOCCUS FAECALIS ESBL-EXTENDED SPECTRUM BETA LACTAMASE-THE ORGANISM IS RESISTANT TO PENICILLINS, CEPHALOSPORINS AND AZTREONAM ACCORDING TO CLSI M100-S15 [email or web address].    Report Status 02/02/2015 FINAL  Final   Organism ID, Bacteria ESCHERICHIA COLI  Final   Organism ID, Bacteria PROVIDENCIA STUARTII  Final   Organism ID, Bacteria ENTEROCOCCUS FAECALIS  Final   Organism ID, Bacteria MORGANELLA MORGANII  Final      Susceptibility   Escherichia coli - MIC*    AMPICILLIN >=32 RESISTANT Resistant     CEFTAZIDIME 4 RESISTANT Resistant     CEFAZOLIN >=64 RESISTANT Resistant     CEFTRIAXONE >=64 RESISTANT  Resistant     CIPROFLOXACIN >=4 RESISTANT Resistant     GENTAMICIN <=1 SENSITIVE Sensitive     IMIPENEM <=0.25 SENSITIVE Sensitive     TRIMETH/SULFA >=320 RESISTANT Resistant     Extended ESBL POSITIVE Resistant     PIP/TAZO Value in next row Sensitive      SENSITIVE8    * HEAVY GROWTH ESCHERICHIA COLI   Morganella morganii - MIC*    AMPICILLIN Value in next row Resistant      RESISTANT>=32    CEFTAZIDIME Value in next row Resistant      RESISTANT>=64    CEFEPIME Value in next row Sensitive      SENSITIVE<=1    CEFAZOLIN Value in next row Resistant      RESISTANT>=64    CEFTRIAXONE Value in next row Sensitive      SENSITIVE8    CIPROFLOXACIN Value in next row Resistant      RESISTANT>=4    GENTAMICIN Value in next row Resistant      RESISTANT>=16    IMIPENEM Value in next row Sensitive      SENSITIVE1    TRIMETH/SULFA Value in next row Resistant      RESISTANT>=320    PIP/TAZO Value in next row Sensitive      SENSITIVE8    * LIGHT GROWTH MORGANELLA MORGANII   Enterococcus faecalis - MIC*    AMPICILLIN Value in next row Sensitive      SENSITIVE8    LINEZOLID Value in next row Sensitive      SENSITIVE8    * MODERATE GROWTH ENTEROCOCCUS FAECALIS   Providencia stuartii - MIC*    AMPICILLIN Value in next row Resistant      SENSITIVE8    CEFTAZIDIME Value in next row Sensitive      SENSITIVE8    CEFAZOLIN Value in next row Resistant  SENSITIVE8    CEFTRIAXONE Value in next row Sensitive      SENSITIVE8    CIPROFLOXACIN Value in next row Resistant      SENSITIVE8    GENTAMICIN Value in next row Resistant      SENSITIVE8    IMIPENEM Value in next row Sensitive      SENSITIVE8    TRIMETH/SULFA Value in next row Resistant      SENSITIVE8    PIP/TAZO Value in next row Sensitive      SENSITIVE<=4    * MODERATE GROWTH PROVIDENCIA STUARTII  Blood culture (routine x 2)     Status: None (Preliminary result)   Collection Time: 01/29/15  6:10 PM  Result Value Ref Range  Status   Specimen Description BLOOD RIGHT HAND  Final   Special Requests   Final    BOTTLES DRAWN AEROBIC AND ANAEROBIC  AER 1CC ANA 5CC   Culture NO GROWTH 4 DAYS  Final   Report Status PENDING  Incomplete  Surgical pcr screen     Status: None   Collection Time: 01/31/15 10:50 PM  Result Value Ref Range Status   MRSA, PCR NEGATIVE NEGATIVE Final   Staphylococcus aureus NEGATIVE NEGATIVE Final    Comment:        The Xpert SA Assay (FDA approved for NASAL specimens in patients over 80 years of age), is one component of a comprehensive surveillance program.  Test performance has been validated by [facility] for patients greater than or equal to 72 year old. It is not intended to diagnose infection nor to guide or monitor treatment.   Gram stain     Status: None   Collection Time: 02/01/15 10:00 AM  Result Value Ref Range Status   Specimen Description TOE  Final   Special Requests NONE  Final   Report Status 02/01/2015 FINAL  Final  Wound culture     Status: None   Collection Time: 02/01/15 10:00 AM  Result Value Ref Range Status   Specimen Description TOE  Final   Special Requests NONE  Final   Report Status 02/01/2015 FINAL  Final  Tissue culture     Status: None (Preliminary result)   Collection Time: 02/01/15 10:00 AM  Result Value Ref Range Status   Specimen Description TOE  Final   Special Requests NONE  Final   Gram Stain PENDING  Incomplete   Culture ISOLATING MULTIPLE PATHOGENS  Final   Report Status PENDING  Incomplete    RADIOLOGY:  Mr Foot Right Wo Contrast  01/31/2015   CLINICAL DATA:  Diabetic with open wound over the great toe. Pre surgical planning. Question osteomyelitis. Initial encounter.  EXAM: MRI OF THE RIGHT FOREFOOT WITHOUT CONTRAST  TECHNIQUE: Multiplanar, multisequence MR imaging was performed. No intravenous contrast was administered.  COMPARISON:  Radiographs 01/29/2015 and 01/11/2015.  FINDINGS: Study is mildly motion degraded. No cortical  destruction or suspicious marrow T2 signal demonstrated. There are mild degenerative changes of the first metatarsal phalangeal joint. There are no significant joint effusions.  There is soft tissue ulceration along the medial and plantar aspect of the proximal great toe. There are small linear foci of low signal suspicious for emphysema or small foreign bodies. These are best seen on images 24 and 25 of series 5. There are inflammatory changes within the underlying subcutaneous fat. No focal fluid collection identified on noncontrast imaging. There is no significant tenosynovitis.  Mild atrophy of the forefoot musculature noted, attributed to diabetes. There are  midfoot degenerative changes, greatest at the articulation between the middle cuneiform and second metatarsal base. There is no significant subluxation at the Lisfranc joint.  IMPRESSION: 1. Inflammatory changes in the great toe with soft tissue ulceration and possible soft tissue emphysema or small foreign bodies. No drainable abscess. 2. No evidence of osteomyelitis. 3. Midfoot degenerative changes.   Electronically Signed   By: Richardean Sale M.D.   On: 01/31/2015 18:59    EKG:   Orders placed or performed during the hospital encounter of 12/03/14  . ED EKG  . ED EKG  . EKG      Management plans discussed with the patient, family and they are in agreement.  CODE STATUS:     Code Status Orders        Start     Ordered   01/29/15 2045  Full code   Continuous     01/29/15 2045      TOTAL TIME TAKING CARE OF THIS PATIENT: 50 minutes.    Theodoro Grist M.D on 02/02/2015 at 1:21 PM  Between 7am to 6pm - Pager - [phone]  After 6pm go to [email or web address] - password [email or web address] [facility]  Office  [phone]  CC: Primary care physician; Morton Peters, MD

## 2015-02-02 NOTE — Progress Notes (Signed)
Pt is being discharge home, waiting on picc line, pt is alert and orient x 3 able to make needs known. Skin warm and dry. Dressing changed today.

## 2015-02-02 NOTE — Progress Notes (Signed)
[facility] NAME: [name]    MR#:  [id number]  DATE OF BIRTH:  1945/10/04  SUBJECTIVE:  CHIEF COMPLAINT:   Chief Complaint  Patient presents with  . Toe Pain   No complaint.s. S/p right great toe debridement, wound cultures are growing multiple bacteria including Escherichia coli, providencia,  Enterococcus,  Morganella, now on meropenem and ampicillin orally. PICC line placement is initiated. Podiatrist recommended to discharge home and follow-up with him as outpatient. Patient was not seen by infectious disease specialist yet REVIEW OF SYSTEMS:  CONSTITUTIONAL: No fever, fatigue or weakness.  EYES: No blurred or double vision.  EARS, NOSE, AND THROAT: No tinnitus or ear pain.  RESPIRATORY: No cough, shortness of breath, wheezing or hemoptysis.  CARDIOVASCULAR: No chest pain, orthopnea, edema.  GASTROINTESTINAL: No nausea, vomiting, diarrhea or abdominal pain.  GENITOURINARY: No dysuria, hematuria.  ENDOCRINE: No polyuria, nocturia,  HEMATOLOGY: No anemia, easy bruising or bleeding SKIN: No rash or lesion. MUSCULOSKELETAL: No joint pain or arthritis. NEUROLOGIC: No tingling, numbness, weakness.  PSYCHIATRY: No anxiety or depression.   DRUG ALLERGIES:   Allergies  Allergen Reactions  . Codeine Other (See Comments)    Reaction:  Hallucinations    VITALS:  Blood pressure 192/58, pulse 63, temperature 98.6 F (37 C), temperature source Oral, resp. rate 18, height 5\' 7"  (1.702 m), weight 67.132 kg (148 lb), SpO2 97 %.  PHYSICAL EXAMINATION:  GENERAL:  69 y.o.-year-old patient lying in the bed with no acute distress.  EYES: Pupils equal, round, reactive to light and accommodation. No scleral icterus. Extraocular muscles intact.  HEENT: Head atraumatic, normocephalic. Oropharynx and nasopharynx clear.  NECK:  Supple, no jugular venous distention. No thyroid enlargement, no tenderness.  LUNGS: Normal breath  sounds bilaterally, no wheezing, rales,rhonchi or crepitation. No use of accessory muscles of respiration.  CARDIOVASCULAR: S1, S2 normal. No murmurs, rubs, or gallops.  ABDOMEN: Soft, nontender, nondistended. Bowel sounds present. No organomegaly or mass.  EXTREMITIES: Bilateral lower extremity edema 1+, no cyanosis, or clubbing. Right foot in dressing. No bleeding noted. No drainage. No significant tenderness. Toes are warm NEUROLOGIC: Cranial nerves II through XII are intact. Muscle strength 5/5 in all extremities. Sensation intact. Gait not checked.  PSYCHIATRIC: The patient is alert and oriented x 3.  SKIN: No obvious rash, lesion, or ulcer.    LABORATORY PANEL:   CBC  Recent Labs Lab 02/02/15 0431  WBC 3.2*  HGB 9.1*  HCT 27.4*  PLT 296   ------------------------------------------------------------------------------------------------------------------  Chemistries   Recent Labs Lab 02/02/15 0431  NA 139  K 3.2*  CL 110  CO2 23  GLUCOSE 180*  BUN 32*  CREATININE 1.37*  CALCIUM 7.7*   ------------------------------------------------------------------------------------------------------------------  Cardiac Enzymes No results for input(s): TROPONINI in the last 168 hours. ------------------------------------------------------------------------------------------------------------------  RADIOLOGY:  Mr Foot Right Wo Contrast  01/31/2015   CLINICAL DATA:  Diabetic with open wound over the great toe. Pre surgical planning. Question osteomyelitis. Initial encounter.  EXAM: MRI OF THE RIGHT FOREFOOT WITHOUT CONTRAST  TECHNIQUE: Multiplanar, multisequence MR imaging was performed. No intravenous contrast was administered.  COMPARISON:  Radiographs 01/29/2015 and 01/11/2015.  FINDINGS: Study is mildly motion degraded. No cortical destruction or suspicious marrow T2 signal demonstrated. There are mild degenerative changes of the first metatarsal phalangeal joint. There are no  significant joint effusions.  There is soft tissue ulceration along the medial and plantar aspect of the proximal great toe. There are small linear foci  of low signal suspicious for emphysema or small foreign bodies. These are best seen on images 24 and 25 of series 5. There are inflammatory changes within the underlying subcutaneous fat. No focal fluid collection identified on noncontrast imaging. There is no significant tenosynovitis.  Mild atrophy of the forefoot musculature noted, attributed to diabetes. There are midfoot degenerative changes, greatest at the articulation between the middle cuneiform and second metatarsal base. There is no significant subluxation at the Lisfranc joint.  IMPRESSION: 1. Inflammatory changes in the great toe with soft tissue ulceration and possible soft tissue emphysema or small foreign bodies. No drainable abscess. 2. No evidence of osteomyelitis. 3. Midfoot degenerative changes.   Electronically Signed   By: Richardean Sale M.D.   On: 01/31/2015 18:59    EKG:   Orders placed or performed during the hospital encounter of 12/03/14  . ED EKG  . ED EKG  . 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 MRI does not show osteomyelitis. Wound culture and blood culture: Escherichia coli ESBL and multiple other bacteria , now on meropenem and ID consult is requested. Place PICC line . Initiate ampicillin orally. Contact isolation.  #2 diabetes mellitus type 2: hemoglobin A1c 5.9. continue sliding scale. Hold oral hypoglycemic agents during admission.  #3 hypertension:  Continue home blood pressure medications including Lasix, hydralazine, Isordil, ramipril, propranolol.  #4 chronic kidney disease stage III: Stable.  #5 history of diastolic congestive heart failure: Stable. Continue Lasix.   Anemia of chronic disease. Hemoglobin decreased from 9.2 to  7.0. S/p 1 unit PRBC transfusion and hemoglobin is up to 8.7. No active bleeding. Getting current studies, but unfortunately will be inaccurate due to recent blood transfusion.    All the records are reviewed and case discussed with Care Management/Social Workerr. Management plans discussed with the patient, her son and they are in agreement.  CODE STATUS: Full code  TOTAL TIME TAKING CARE OF THIS PATIENT: 45 minutes.   POSSIBLE D/C TODAY OR IN 1-2 DAYS, DEPENDING ON discharge planning. Home health services will be arranged for patient upon discharge Zahki Hoogendoorn M.D on 02/02/2015 at 11:42 AM  Between 7am to 6pm - Pager - [phone]  After 6pm go to [email or web address] - password [email or web address] [facility]  Office  [phone]  CC: Primary care physician; Morton Peters, MD

## 2015-02-02 NOTE — Evaluation (Signed)
Physical Therapy Evaluation Patient Details Name: [name] MRN: [id number] DOB: May 07, 1945 Today's Date: 02/02/2015   History of Present Illness  Pt is a 69yo black female who presents 1day s/p surgical debriodement of wound on R plantar and medial hallux. Pt was recently admitted 1 month ago for another CC, but the woudn was present at that time, and has been followed by the wound clinic.   Clinical Impression  Pt is received semirecumbent in bed upon entry, awake, alert, and willing to participate. No acute distress noted.  Pt is A&Ox3 and pleasant. Pt reports zero falls in the last 6 months, however, currently a high falls risk as it pertains to Berg score of 23/56. Pt strength is [facility] for household mobility, which is pt's baseline. Pt is able to ambulate safe household distances with RW at baseline level, but has poor tolerance to prolonged standing for balance testing. Patient presenting with impairment of balance and activity tolerance, limiting ability to perform ADL and mobility tasks at  baseline level of function. Patient will benefit from skilled intervention to address the above impairments and limitations, in order to restore to prior level of function, improve patient safety upon discharge, and to decrease falls risk.       Follow Up Recommendations Home health PT    Equipment Recommendations  None recommended by PT    Recommendations for Other Services       Precautions / Restrictions Precautions Precautions: Fall Restrictions Weight Bearing Restrictions: Yes RLE Weight Bearing: Weight bearing as tolerated (Confirmed from Dr. Vickki Muff in person. )      Mobility  Bed Mobility Overal bed mobility: Modified Independent                Transfers Overall transfer level: Needs assistance Equipment used: Rolling walker (2 wheeled) Transfers: Sit to/from Stand Sit to Stand: Supervision         General transfer comment: Requires several attempts but is able  to stand without physical assistance; sits without warning during balance testing, reported 'needs a rest.'  Ambulation/Gait Ambulation/Gait assistance: Supervision Ambulation Distance (Feet): 110 Feet Assistive device: Rolling walker (2 wheeled) Gait Pattern/deviations: WFL(Within Functional Limits);Step-to pattern   Gait velocity interpretation: <1.8 ft/sec, indicative of risk for recurrent falls General Gait Details: Pt attemptign to bear weight mostly on R heel.   Stairs            Wheelchair Mobility    Modified Rankin (Stroke Patients Only)       Balance Overall balance assessment: Modified Independent;No apparent balance deficits (not formally assessed) (Reports 0 falls in past 6 months. )                               Standardized Balance Assessment Standardized Balance Assessment : Berg Balance Test Berg Balance Test Sit to Stand: Able to stand using hands after several tries Standing Unsupported: Able to stand 2 minutes with supervision Sitting with Back Unsupported but Feet Supported on Floor or Stool: Able to sit safely and securely 2 minutes Stand to Sit: Sits independently, has uncontrolled descent Transfers: Able to transfer safely, definite need of hands Standing Unsupported with Eyes Closed: Able to stand 10 seconds with supervision Standing Ubsupported with Feet Together: Needs help to attain position and unable to hold for 15 seconds From Standing, Reach Forward with Outstretched Arm: Can reach forward >5 cm safely (2") From Standing Position, Pick up Object from Floor: Unable  to try/needs assist to keep balance (Limited by posterior weight bearing) From Standing Position, Turn to Look Behind Over each Shoulder: Turn sideways only but maintains balance Turn 360 Degrees: Able to turn 360 degrees safely but slowly Standing Unsupported, Alternately Place Feet on Step/Stool: Able to complete >2 steps/needs minimal assist Standing Unsupported,  One Foot in Front: Loses balance while stepping or standing Standing on One Leg: Unable to try or needs assist to prevent fall Total Score: 23         Pertinent Vitals/Pain Pain Assessment: No/denies pain    Home Living Family/patient expects to be discharged to:: Private residence Living Arrangements: Spouse/significant other Available Help at Discharge: Family Type of Home: House Home Access: Stairs to enter Entrance Stairs-Rails: Can reach both Entrance Stairs-Number of Steps: 2 Home Layout: One level Home Equipment: [place] - 2 wheels;Cane - single point      Prior Function Level of Independence: Independent with assistive device(s)         Comments: Uses RW for all mobility. Does not perform community distance ambualtion due to self reports fo low energy and acttivity tolerance.      Hand Dominance        Extremity/Trunk Assessment   Upper Extremity Assessment: Overall WFL for tasks assessed           Lower Extremity Assessment: Overall WFL for tasks assessed      Cervical / Trunk Assessment: Normal  Communication   Communication: No difficulties  Cognition Arousal/Alertness: Awake/alert Behavior During Therapy: WFL for tasks assessed/performed Overall Cognitive Status: Within Functional Limits for tasks assessed                      General Comments      Exercises        Assessment/Plan    PT Assessment Patient needs continued PT services  PT Diagnosis Generalized weakness;Difficulty walking;Abnormality of gait   PT Problem List Decreased activity tolerance;Decreased balance;Decreased safety awareness  PT Treatment Interventions DME instruction;Gait training;Stair training;Functional mobility training;Balance training   PT Goals (Current goals can be found in the Care Plan section) Acute Rehab PT Goals Patient Stated Goal: Pt wants to return home and continue to improve ability to walk longer distances.  PT Goal Formulation: With  patient Time For Goal Achievement: 02/16/15 Potential to Achieve Goals: Fair    Frequency 7X/week   Barriers to discharge Inaccessible home environment 2 stairs     Co-evaluation               End of Session Equipment Utilized During Treatment: Gait belt Activity Tolerance: Patient tolerated treatment well;No increased pain;Patient limited by fatigue (requires several sitting rest breaks. ) Patient left: in bed;with call bell/phone within reach;with bed alarm set Nurse Communication: Mobility status         Time: 2130-8657 PT Time Calculation (min) (ACUTE ONLY): 18 min   Charges:   PT Evaluation $Initial PT Evaluation Tier I: 1 Procedure     PT G Codes:        Merryn Thaker C Feb 03, 2015, 9:45 AM  9:48 AM  Etta Grandchild, PT, DPT [place] License # [id number]

## 2015-02-02 NOTE — Care Management Note (Addendum)
Case Management Note  Patient Details  Name: MANASVI DICKARD MRN: [id number] Date of Birth: Dec 28, 1945  Subjective/Objective:     Discharge plan is for Ms Loeb to receive a PICC Line this evening, and to receive her second dose of IV Meropenem before she is discharged home tonight. Discussed second dose of Meropenem with Kathrynn Speed. Referral for home health RN and PT was faxed and called to [facility]. This Probation officer spoke with the pharmacist at [facility] and requested the first home health RN dose of IV Meropenem BID be given on 02/03/15 between 8am to [place] that the home health nurse should call before going to Ms Ratledge's home on Sunday morning because there is always an outside chance that Ms Oats will not be discharged until Sunday morning.  The [facility] pharmacist mentioned delivering the IV Meropenem to Ms Shor at the hospital this afternoon and this writer advised against this to avoid confusion or losing the medication during transport home from the hospital.                Action/Plan:   Expected Discharge Date:                  Expected Discharge Plan:     In-House Referral:     Discharge planning Services     Post Acute Care Choice:    Choice offered to:     DME Arranged:    DME Agency:     HH Arranged:    [place] Agency:     Status of Service:     Medicare Important Message Given:  Yes-second notification given Date Medicare IM Given:    Medicare IM give by:    Date Additional Medicare IM Given:    Additional Medicare Important Message give by:     If discussed at [place] of Stay Meetings, dates discussed:    Additional Comments:  Brave Dack A, RN 02/02/2015, 3:12 PM

## 2015-02-02 NOTE — Progress Notes (Addendum)
F/u right great toe debridement.  No complaints.  Dressing removed and wound is dry and healthy.  No purulence or foul odor today.  OK for pt to be d/c'd from podiatry standpoint.    F/U with me this week in outpt clinic.  OK for ambulation as tolerated.  Dressing changesby home health: Begin Acticoat dressings to wound and change 3 x's per week.    Pt states she has home health set up already.

## 2015-02-02 NOTE — Plan of Care (Signed)
Problem: Acute Rehab PT Goals(only PT should resolve) Goal: Patient Will Transfer Sit To/From Stand Pt will transfer sit to/from-stand with RW at [place] without loss-of-balance to demonstrate good safety awareness for independent mobility in home.     Goal: Pt Will Ambulate Pt will ambulate with RW at Supervision using a step-through pattern and equal step length for a distances greater than 281ft to demonstrate the ability to perform safe limited community distance ambulation at discharge.    Goal: Pt Will Go Up/Down Stairs Pt will ascend/descend 2 stairs with LRAD and 1 HR at Supervision to demonstrate safe entry/exit of home.

## 2015-02-03 LAB — CULTURE, BLOOD (ROUTINE X 2): Culture: NO GROWTH

## 2015-02-04 ENCOUNTER — Ambulatory Visit: Payer: Medicare HMO | Admitting: Family

## 2015-02-05 ENCOUNTER — Other Ambulatory Visit: Payer: Self-pay

## 2015-02-05 DIAGNOSIS — K5900 Constipation, unspecified: Secondary | ICD-10-CM

## 2015-02-05 MED ORDER — DOCUSATE SODIUM 100 MG PO CAPS: 100.0000 mg | ORAL_CAPSULE | Freq: Two times a day (BID) | ORAL | Status: AC

## 2015-02-06 LAB — ANAEROBIC CULTURE

## 2015-02-21 ENCOUNTER — Inpatient Hospital Stay: Payer: Medicare HMO | Attending: Hematology and Oncology | Admitting: Hematology and Oncology

## 2015-02-21 ENCOUNTER — Inpatient Hospital Stay: Payer: Medicare HMO

## 2015-02-21 VITALS — BP 147/76 | HR 69 | Temp 98.6°F | Wt 135.6 lb

## 2015-02-21 DIAGNOSIS — Z79899 Other long term (current) drug therapy: Secondary | ICD-10-CM | POA: Insufficient documentation

## 2015-02-21 DIAGNOSIS — E119 Type 2 diabetes mellitus without complications: Secondary | ICD-10-CM | POA: Insufficient documentation

## 2015-02-21 DIAGNOSIS — N183 Chronic kidney disease, stage 3 unspecified: Secondary | ICD-10-CM

## 2015-02-21 DIAGNOSIS — D631 Anemia in chronic kidney disease: Secondary | ICD-10-CM | POA: Diagnosis not present

## 2015-02-21 DIAGNOSIS — R634 Abnormal weight loss: Secondary | ICD-10-CM | POA: Insufficient documentation

## 2015-02-21 DIAGNOSIS — Z8673 Personal history of transient ischemic attack (TIA), and cerebral infarction without residual deficits: Secondary | ICD-10-CM | POA: Insufficient documentation

## 2015-02-21 DIAGNOSIS — E538 Deficiency of other specified B group vitamins: Secondary | ICD-10-CM | POA: Insufficient documentation

## 2015-02-21 DIAGNOSIS — E785 Hyperlipidemia, unspecified: Secondary | ICD-10-CM | POA: Diagnosis not present

## 2015-02-21 DIAGNOSIS — I129 Hypertensive chronic kidney disease with stage 1 through stage 4 chronic kidney disease, or unspecified chronic kidney disease: Secondary | ICD-10-CM | POA: Diagnosis not present

## 2015-02-21 DIAGNOSIS — D539 Nutritional anemia, unspecified: Secondary | ICD-10-CM

## 2015-02-21 DIAGNOSIS — Z87891 Personal history of nicotine dependence: Secondary | ICD-10-CM | POA: Diagnosis not present

## 2015-02-21 DIAGNOSIS — I509 Heart failure, unspecified: Secondary | ICD-10-CM | POA: Diagnosis not present

## 2015-02-21 DIAGNOSIS — I2510 Atherosclerotic heart disease of native coronary artery without angina pectoris: Secondary | ICD-10-CM | POA: Diagnosis not present

## 2015-02-21 LAB — TSH: TSH: 2.97 u[IU]/mL (ref 0.350–4.500)

## 2015-02-21 LAB — COMPREHENSIVE METABOLIC PANEL
ALT: 7 U/L — ABNORMAL LOW (ref 14–54)
AST: 18 U/L (ref 15–41)
Albumin: 2.3 g/dL — ABNORMAL LOW (ref 3.5–5.0)
Alkaline Phosphatase: 81 U/L (ref 38–126)
Anion gap: 6 (ref 5–15)
BUN: 27 mg/dL — ABNORMAL HIGH (ref 6–20)
CO2: 26 mmol/L (ref 22–32)
Calcium: 7.8 mg/dL — ABNORMAL LOW (ref 8.9–10.3)
Chloride: 102 mmol/L (ref 101–111)
Creatinine, Ser: 1.33 mg/dL — ABNORMAL HIGH (ref 0.44–1.00)
GFR calc Af Amer: 46 mL/min — ABNORMAL LOW (ref >60)
GFR calc non Af Amer: 40 mL/min — ABNORMAL LOW (ref >60)
Glucose, Bld: 55 mg/dL — ABNORMAL LOW (ref 65–99)
Potassium: 2.7 mmol/L — CL (ref 3.5–5.1)
Sodium: 134 mmol/L — ABNORMAL LOW (ref 135–145)
Total Bilirubin: 0.8 mg/dL (ref 0.3–1.2)
Total Protein: 6.4 g/dL — ABNORMAL LOW (ref 6.5–8.1)

## 2015-02-21 LAB — CBC WITH DIFFERENTIAL/PLATELET
Basophils Absolute: 0 10*3/uL (ref 0–0.1)
Basophils Relative: 1 %
Eosinophils Absolute: 0.5 10*3/uL (ref 0–0.7)
Eosinophils Relative: 17 %
HCT: 27.2 % — ABNORMAL LOW (ref 35.0–47.0)
Hemoglobin: 9.1 g/dL — ABNORMAL LOW (ref 12.0–16.0)
Lymphocytes Relative: 26 %
Lymphs Abs: 0.8 10*3/uL — ABNORMAL LOW (ref 1.0–3.6)
MCH: 28.7 pg (ref 26.0–34.0)
MCHC: 33.4 g/dL (ref 32.0–36.0)
MCV: 86 fL (ref 80.0–100.0)
Monocytes Absolute: 0.1 10*3/uL — ABNORMAL LOW (ref 0.2–0.9)
Monocytes Relative: 4 %
Neutro Abs: 1.5 10*3/uL (ref 1.4–6.5)
Neutrophils Relative %: 52 %
Platelets: 213 10*3/uL (ref 150–440)
RBC: 3.16 MIL/uL — ABNORMAL LOW (ref 3.80–5.20)
RDW: 17.2 % — ABNORMAL HIGH (ref 11.5–14.5)
WBC: 2.9 10*3/uL — ABNORMAL LOW (ref 3.6–11.0)

## 2015-02-21 LAB — FOLATE: Folate: 4.4 ng/mL — ABNORMAL LOW (ref >5.9)

## 2015-02-21 LAB — VITAMIN B12: Vitamin B-12: >7500 pg/mL — ABNORMAL HIGH (ref 180–914)

## 2015-02-21 LAB — FERRITIN: Ferritin: 808 ng/mL — ABNORMAL HIGH (ref 11–307)

## 2015-02-21 LAB — IRON AND TIBC: Iron: 21 ug/dL — ABNORMAL LOW (ref 28–170)

## 2015-02-21 NOTE — Progress Notes (Signed)
Patient here for initial visit, referral from Dr. Ouida Sills

## 2015-02-21 NOTE — Progress Notes (Signed)
[facility] day:  02/21/2015  Chief Complaint: ABBEE Marsh is a 69 y.o. female with anemia who is referred in consultation by Dr. Frazier Richards.  HPI:  The patient to have anemia back to at least 2015. She states that she has had pica. She received IV iron in the past.  She has been followed by Dr. Ouida Sills in the [facility] over the past year. She states that previously she took iron pills. EGD and colonoscopy x 2 (1 or 2 this year per patient's report) were negative. She was noted to have blood in her stool once.   EGD and colonoscopy by Dr Laurian Brim on 09/24/2014 revealed a normal esophagus, gastritis, and normal jejunum.  Colonoscopy noted a poor prep and internal hemorrhoids.  Colonoscopy on 11/12/2014 by Dr. [name] revealed only internal and external hemorrhoids.  She notes a history of subdural hematoma in 12/2014 requiring burr holes and drainage.  She has had no further bleeding.    She was admitted to [facility] from 01/29/2015 until 02/02/2015. She was admitted with cellulitis of the right great toe. During that admission, she was noted to have stage III chronic kidney disease.  Hemoglobin was 7. She was transfused with 1 unit of packed red blood cells.  She had no evidence of GI bleeding.  However, she was felt to benefit from a GI evaluation in the outpatient department.  Labs on 10/17/2014 revealed a hemoglobin of 8.1, platelets 397,000, white count 6300 and normal differential.  Creatinine was 1.6 with an estimated GFR of 39 ml/minute. Calcium was 8.1.  Regarding her diet, she states that she doesn't eat bread. She eats a lot of vegetables. She eats eggs, sausage, and beans. She seldom eats pork chops. She likes chicken. She may have meat 2-3 times a week. She denies any pica.  Past Medical History  Diagnosis Date  . Hypertension   . Diabetes mellitus without complication   . Anemia   .  Coronary artery disease   . CHF (congestive heart failure)   . Glaucoma   . Hyperlipidemia   . Chronic kidney disease   . Neuropathy   . Chronic anemia   . Stroke     Past Surgical History  Procedure Laterality Date  . Cardiac catheterization    . Esophagogastroduodenoscopy (egd) with propofol N/A 09/24/2014    Elliott-gastritis & normal Billroth II changes   . Colonoscopy with propofol N/A 09/24/2014    Elliott-Incomplete secondary to prep  . Coronary angioplasty  2016    stent placed  . Bilroth ii procedure    . Colonoscopy with propofol N/A 11/12/2014    Procedure: COLONOSCOPY WITH PROPOFOL;  Surgeon: Manya Silvas, MD;  Location: [facility] ENDOSCOPY;  Service: Endoscopy;  Laterality: N/A;  Trudee Kuster hole Right 12/06/2014    Procedure: Right burr hole  for subdural hematoma;  Surgeon: Newman Pies, MD;  Location: [facility] NEURO ORS;  Service: Neurosurgery;  Laterality: Right;  . Irrigation and debridement foot Right 02/01/2015    Procedure: IRRIGATION AND DEBRIDEMENT FOOT/RIGHT GREAT TOE;  Surgeon: [name], DPM;  Location: [facility] ORS;  Service: Podiatry;  Laterality: Right;    Family History  Problem Relation Age of Onset  . Diabetes Mother   . Heart disease Father   . Diabetes Sister   . Lung cancer Brother   . Cancer Brother   . Diabetes Brother   . Diabetes Sister     Social  History:  reports that she has quit smoking. She has never used smokeless tobacco. She reports that she does not drink alcohol or use illicit drugs.  The patient is accompanied by her husband, [name], today.  Allergies:  Allergies  Allergen Reactions  . Codeine Other (See Comments)    Reaction:  Hallucinations    Current Medications: Current Outpatient Prescriptions  Medication Sig Dispense Refill  . acetaminophen (TYLENOL) 325 MG tablet Take 2 tablets (650 mg total) by mouth every 4 (four) hours as needed for mild pain (or temp > 99 F).    . docusate sodium (COLACE) 100 MG capsule Take 1 capsule  (100 mg total) by mouth 2 (two) times daily. 60 capsule 11  . feeding supplement, GLUCERNA SHAKE, (GLUCERNA SHAKE) LIQD Take 237 mLs by mouth 2 (two) times daily between meals. 60 Can 0  . ferrous sulfate 325 (65 FE) MG tablet Take 325 mg by mouth daily.    . furosemide (LASIX) 40 MG tablet Take 1 tablet (40 mg total) by mouth 2 (two) times daily. 60 tablet 5  . glimepiride (AMARYL) 2 MG tablet Take 1 mg by mouth daily.    . hydrALAZINE (APRESOLINE) 100 MG tablet Take 100 mg by mouth 3 (three) times daily.     Marland Kitchen HYDROcodone-acetaminophen (NORCO/VICODIN) 5-325 MG tablet Take 1-2 tablets by mouth every 4 (four) hours as needed for moderate pain. 30 tablet 0  . insulin aspart (NOVOLOG) 100 UNIT/ML injection Inject 0-9 Units into the skin 3 (three) times daily with meals. (Patient taking differently: Inject 0-9 Units into the skin 3 (three) times daily with meals. Pt uses based off of a sliding scale.) 10 mL 11  . isosorbide dinitrate (ISORDIL) 30 MG tablet Take 30 mg by mouth 2 (two) times daily.    . meropenem 1 g in sodium chloride 0.9 % 100 mL Inject 1 g into the vein every 12 (twelve) hours. 28 Dose 1  . nitroGLYCERIN (NITROSTAT) 0.4 MG SL tablet Place 0.4 mg under the tongue every 5 (five) minutes as needed for chest pain.    . Omega-3 Fatty Acids (FISH OIL) 1000 MG CAPS Take 1,000 mg by mouth daily.     Marland Kitchen omeprazole (PRILOSEC) 20 MG capsule Take 20 mg by mouth daily.    . potassium chloride SA (K-DUR,KLOR-CON) 20 MEQ tablet Take 1 tablet (20 mEq total) by mouth daily. 30 tablet 5  . propranolol (INDERAL) 20 MG tablet Take 40 mg by mouth 3 (three) times daily.    . ramipril (ALTACE) 5 MG capsule Take 5 mg by mouth daily.    . simvastatin (ZOCOR) 20 MG tablet Take 20 mg by mouth at bedtime.    . traMADol (ULTRAM) 50 MG tablet Take 0.5-1 tablets (25-50 mg total) by mouth 3 (three) times daily as needed for moderate pain. 10 tablet 0   No current facility-administered medications for this visit.     Review of Systems:  GENERAL:  Feels good.  Active.  No fevers or sweats.  Weight loss (previously 220# before got "sick").  One month ago weighed 160-165#. PERFORMANCE STATUS (ECOG):  1-2 HEENT:  No visual changes, runny nose, sore throat, mouth sores or tenderness. Lungs: No shortness of breath or cough.  No hemoptysis. Cardiac:  No chest pain, palpitations, orthopnea, or PND. GI:  No nausea, vomiting, diarrhea, constipation, melena or hematochezia. GU:  No urgency, frequency, dysuria, or hematuria. Musculoskeletal:  Arthritis.  Right foot sore.  No back pain. No muscle tenderness.  Extremities:  No pain or swelling. Skin:  No rashes or skin changes. Neuro:  No headache, numbness or weakness, balance or coordination issues. Endocrine:  No diabetes, thyroid issues, hot flashes or night sweats. Psych:  No mood changes, depression or anxiety. Pain:  No focal pain. Review of systems:  All other systems reviewed and found to be negative.  Physical Exam: Blood pressure 147/76, pulse 69, temperature 98.6 F (37 C), temperature source Oral, weight 135 lb 9.3 oz (61.5 kg). GENERAL:  Well developed, well nourished, sitting comfortably in a wheelchair in the exam room in no acute distress. MENTAL STATUS:  Alert and oriented to person, place and time. HEAD:  Short graying hair.  Temporal wasting.  Normocephalic, atraumatic, face symmetric, no Cushingoid features. EYES:  Brown eyes.  Pupils equal round and reactive to light and accomodation.  No conjunctivitis or scleral icterus. ENT:  Oropharynx clear without lesion.  Dentures.  Tongue normal. Mucous membranes moist.  RESPIRATORY:  Decreased breath sounds at the bases.  Clear to auscultation without rales, wheezes or rhonchi. CARDIOVASCULAR:  Regular rate and rhythm without murmur, rub or gallop. ABDOMEN:  Soft, non-tender, with active bowel sounds, and no hepatosplenomegaly.  No masses. SKIN:  No rashes, ulcers or lesions. EXTREMITIES:  Bilateral 2-3+ edema.  No skin discoloration or tenderness.  No palpable cords. LYMPH NODES: No palpable cervical, supraclavicular, axillary or inguinal adenopathy  NEUROLOGICAL: Unremarkable. PSYCH:  Appropriate.  No visits with results within 3 Day(s) from this visit. Latest known visit with results is:  Admission on 01/29/2015, Discharged on 02/02/2015  Component Date Value Ref Range Status  . WBC 01/29/2015 3.3* 3.6 - 11.0 K/uL Final  . RBC 01/29/2015 2.46* 3.80 - 5.20 MIL/uL Final  . Hemoglobin 01/29/2015 7.0* 12.0 - 16.0 g/dL Final  . HCT 01/29/2015 21.6* 35.0 - 47.0 % Final  . MCV 01/29/2015 87.8  80.0 - 100.0 fL Final  . MCH 01/29/2015 28.3  26.0 - 34.0 pg Final  . MCHC 01/29/2015 32.2  32.0 - 36.0 g/dL Final  . RDW 01/29/2015 21.6* 11.5 - 14.5 % Final  . Platelets 01/29/2015 251  150 - 440 K/uL Final  . Neutrophils Relative % 01/29/2015 72   Final  . Neutro Abs 01/29/2015 2.4  1.4 - 6.5 K/uL Final  . Lymphocytes Relative 01/29/2015 19   Final  . Lymphs Abs 01/29/2015 0.6* 1.0 - 3.6 K/uL Final  . Monocytes Relative 01/29/2015 6   Final  . Monocytes Absolute 01/29/2015 0.2  0.2 - 0.9 K/uL Final  . Eosinophils Relative 01/29/2015 3   Final  . Eosinophils Absolute 01/29/2015 0.1  0 - 0.7 K/uL Final  . Basophils Relative 01/29/2015 0   Final  . Basophils Absolute 01/29/2015 0.0  0 - 0.1 K/uL Final  . Sodium 01/29/2015 134* 135 - 145 mmol/L Final  . Potassium 01/29/2015 3.0* 3.5 - 5.1 mmol/L Final  . Chloride 01/29/2015 106  101 - 111 mmol/L Final  . CO2 01/29/2015 21* 22 - 32 mmol/L Final  . Glucose, Bld 01/29/2015 85  65 - 99 mg/dL Final  . BUN 01/29/2015 36* 6 - 20 mg/dL Final  . Creatinine, Ser 01/29/2015 1.38* 0.44 - 1.00 mg/dL Final  . Calcium 01/29/2015 7.5* 8.9 - 10.3 mg/dL Final  . GFR calc non Af Amer 01/29/2015 38* >60 mL/min Final  . GFR calc Af Amer 01/29/2015 44* >60 mL/min Final   Comment: (NOTE) The eGFR has been calculated using the CKD EPI equation. This  calculation has  not been validated in all clinical situations. eGFR's persistently <60 mL/min signify possible Chronic Kidney Disease.   . Anion gap 01/29/2015 7  5 - 15 Final  . Specimen Description 01/29/2015 TOE  RIGHT   Final  . Special Requests 01/29/2015 NONE   Final  . Gram Stain 01/29/2015    Final                   Value:FEW WBC SEEN RARE GRAM POSITIVE COCCI IN PAIRS RARE GRAM NEGATIVE RODS   . Culture 01/29/2015    Final                   Value:HEAVY GROWTH ESCHERICHIA COLI MODERATE GROWTH PROVIDENCIA STUARTII LIGHT GROWTH MORGANELLA MORGANII MODERATE GROWTH ENTEROCOCCUS FAECALIS ESBL-EXTENDED SPECTRUM BETA LACTAMASE-THE ORGANISM IS RESISTANT TO PENICILLINS, CEPHALOSPORINS AND AZTREONAM ACCORDING TO CLSI M100-S15 VOL.Oto.   Marland Kitchen Report Status 01/29/2015 02/02/2015 FINAL   Final  . Organism ID, Bacteria 01/29/2015 ESCHERICHIA COLI   Final  . Organism ID, Bacteria 01/29/2015 PROVIDENCIA STUARTII   Final  . Organism ID, Bacteria 01/29/2015 ENTEROCOCCUS FAECALIS   Final  . Organism ID, Bacteria 01/29/2015 MORGANELLA MORGANII   Final  . Specimen Description 01/29/2015 BLOOD LEFT ARM   Final  . Special Requests 01/29/2015 BOTTLES DRAWN AEROBIC AND ANAEROBIC 2CC   Final  . Culture  Setup Time 01/29/2015    Final                   Value:GRAM POSITIVE COCCI AEROBIC BOTTLE ONLY CRITICAL RESULT CALLED TO, READ BACK BY AND VERIFIED WITH: TERESA COBLE AT 2150 01/30/15 SDR CONFIRMED BY AJO   . Culture 01/29/2015    Final                   Value:COAGULASE NEGATIVE STAPHYLOCOCCUS AEROBIC BOTTLE ONLY Results consistent with contamination.   . Report Status 01/29/2015 02/02/2015 FINAL   Final  . Specimen Description 01/29/2015 BLOOD RIGHT HAND   Final  . Special Requests 01/29/2015 BOTTLES DRAWN AEROBIC AND ANAEROBIC  AER 1CC ANA 5CC   Final  . Culture 01/29/2015 NO GROWTH 5 DAYS   Final  . Report Status 01/29/2015 02/03/2015 FINAL   Final  . Hgb A1c MFr Bld 01/29/2015 5.9   4.0 - 6.0 % Final  . WBC 01/30/2015 3.5* 3.6 - 11.0 K/uL Final  . RBC 01/30/2015 2.52* 3.80 - 5.20 MIL/uL Final  . Hemoglobin 01/30/2015 7.0* 12.0 - 16.0 g/dL Final  . HCT 01/30/2015 22.2* 35.0 - 47.0 % Final  . MCV 01/30/2015 88.3  80.0 - 100.0 fL Final  . MCH 01/30/2015 27.7  26.0 - 34.0 pg Final  . MCHC 01/30/2015 31.3* 32.0 - 36.0 g/dL Final  . RDW 01/30/2015 21.2* 11.5 - 14.5 % Final  . Platelets 01/30/2015 251  150 - 440 K/uL Final  . Sodium 01/30/2015 140  135 - 145 mmol/L Final  . Potassium 01/30/2015 3.8  3.5 - 5.1 mmol/L Final  . Chloride 01/30/2015 112* 101 - 111 mmol/L Final  . CO2 01/30/2015 22  22 - 32 mmol/L Final  . Glucose, Bld 01/30/2015 132* 65 - 99 mg/dL Final  . BUN 01/30/2015 36* 6 - 20 mg/dL Final  . Creatinine, Ser 01/30/2015 1.33* 0.44 - 1.00 mg/dL Final  . Calcium 01/30/2015 7.7* 8.9 - 10.3 mg/dL Final  . GFR calc non Af Amer 01/30/2015 40* >60 mL/min Final  . GFR calc Af Amer 01/30/2015 46* >60 mL/min Final  Comment: (NOTE) The eGFR has been calculated using the CKD EPI equation. This calculation has not been validated in all clinical situations. eGFR's persistently <60 mL/min signify possible Chronic Kidney Disease.   . Anion gap 01/30/2015 6  5 - 15 Final  . Glucose-Capillary 01/29/2015 63* 65 - 99 mg/dL Final  . Comment 1 01/29/2015 Notify RN   Final  . Glucose-Capillary 01/30/2015 135* 65 - 99 mg/dL Final  . Comment 1 01/30/2015 Notify RN   Final  . ABO/RH(D) 01/30/2015 O POS   Final  . Antibody Screen 01/30/2015 NEG   Final  . Sample Expiration 01/30/2015 02/02/2015   Final  . Unit Number 01/30/2015 [id number]   Final  . Blood Component Type 01/30/2015 RED CELLS,LR   Final  . Unit division 01/30/2015 00   Final  . Status of Unit 01/30/2015 ISSUED,FINAL   Final  . Transfusion Status 01/30/2015 OK TO TRANSFUSE   Final  . Crossmatch Result 01/30/2015 Compatible   Final  . Order Confirmation 01/30/2015 ORDER PROCESSED BY BLOOD BANK   Final  .  Glucose-Capillary 01/30/2015 98  65 - 99 mg/dL Final  . Comment 1 01/30/2015 Notify RN   Final  . Glucose-Capillary 01/30/2015 98  65 - 99 mg/dL Final  . Comment 1 01/30/2015 Notify RN   Final  . WBC 01/31/2015 4.1  3.6 - 11.0 K/uL Final  . RBC 01/31/2015 3.07* 3.80 - 5.20 MIL/uL Final  . Hemoglobin 01/31/2015 8.7* 12.0 - 16.0 g/dL Final  . HCT 01/31/2015 26.4* 35.0 - 47.0 % Final  . MCV 01/31/2015 86.0  80.0 - 100.0 fL Final  . MCH 01/31/2015 28.3  26.0 - 34.0 pg Final  . MCHC 01/31/2015 32.9  32.0 - 36.0 g/dL Final  . RDW 01/31/2015 19.0* 11.5 - 14.5 % Final  . Platelets 01/31/2015 285  150 - 440 K/uL Final  . Glucose-Capillary 01/30/2015 136* 65 - 99 mg/dL Final  . Comment 1 01/30/2015 Notify RN   Final  . Glucose-Capillary 01/31/2015 166* 65 - 99 mg/dL Final  . Glucose-Capillary 01/31/2015 106* 65 - 99 mg/dL Final  . Glucose-Capillary 01/31/2015 110* 65 - 99 mg/dL Final  . Comment 1 01/31/2015 Notify RN   Final  . Glucose-Capillary 01/31/2015 116* 65 - 99 mg/dL Final  . Comment 1 01/31/2015 Notify RN   Final  . Creatinine, Ser 02/01/2015 1.35* 0.44 - 1.00 mg/dL Final  . GFR calc non Af Amer 02/01/2015 39* >60 mL/min Final  . GFR calc Af Amer 02/01/2015 45* >60 mL/min Final   Comment: (NOTE) The eGFR has been calculated using the CKD EPI equation. This calculation has not been validated in all clinical situations. eGFR's persistently <60 mL/min signify possible Chronic Kidney Disease.   . Glucose-Capillary 01/31/2015 133* 65 - 99 mg/dL Final  . Comment 1 01/31/2015 Notify RN   Final  . MRSA, PCR 01/31/2015 NEGATIVE  NEGATIVE Final  . Staphylococcus aureus 01/31/2015 NEGATIVE  NEGATIVE Final   Comment:        The Xpert SA Assay (FDA approved for NASAL specimens in patients over 60 years of age), is one component of a comprehensive surveillance program.  Test performance has been validated by [facility] for patients greater than or equal to 54 year old. It is not  intended to diagnose infection nor to guide or monitor treatment.   . Glucose-Capillary 02/01/2015 144* 65 - 99 mg/dL Final  . Comment 1 02/01/2015 Notify RN   Final  . Specimen Description 02/01/2015 TOE  Final  . Special Requests 02/01/2015 NONE   Final  . Culture 02/01/2015    Final                   Value:MODERATE GROWTH ANAEROBIC GRAM NEGATIVE ROD UNABLE TO ID FURTHER BETA LACTAMASE NEGATIVE MODERATE GROWTH BACTEROIDES THETAIOTAOMICRON BETA LACTAMASE POSITIVE   . Report Status 02/01/2015 02/06/2015 FINAL   Final  . Specimen Description 02/01/2015 TOE   Final  . Special Requests 02/01/2015 NONE   Final  . Report Status 02/01/2015 02/01/2015 FINAL   Final  . Specimen Description 02/01/2015 TOE   Final  . Special Requests 02/01/2015 NONE   Final  . Report Status 02/01/2015 02/01/2015 FINAL   Final  . Specimen Description 02/01/2015 TOE   Final  . Special Requests 02/01/2015 NONE   Final  . Gram Stain 02/01/2015 PENDING   Incomplete  . Culture 02/01/2015    Final                   Value:HEAVY GROWTH ESCHERICHIA COLI HEAVY GROWTH PROVIDENCIA STUARTII MODERATE GROWTH MORGANELLA MORGANII MODERATE GROWTH ENTEROCOCCUS FAECALIS Susceptibility Pattern Suggests Possibility of an Extended Spectrum Beta Lactamase Producer. Contact Laboratory Within 7 Days if Confirmation Warranted.   . Report Status 02/01/2015 PENDING   Incomplete  . Organism ID, Bacteria 02/01/2015 ESCHERICHIA COLI   Final  . Organism ID, Bacteria 02/01/2015 PROVIDENCIA STUARTII   Final  . Organism ID, Bacteria 02/01/2015 MORGANELLA MORGANII   Final  . Organism ID, Bacteria 02/01/2015 ENTEROCOCCUS FAECALIS   Final  . Glucose-Capillary 02/01/2015 106* 65 - 99 mg/dL Final  . Comment 1 02/01/2015 Notify RN   Final  . Glucose-Capillary 02/01/2015 132* 65 - 99 mg/dL Final  . Comment 1 02/01/2015 Notify RN   Final  . Sodium 02/02/2015 139  135 - 145 mmol/L Final  . Potassium 02/02/2015 3.2* 3.5 - 5.1 mmol/L Final  .  Chloride 02/02/2015 110  101 - 111 mmol/L Final  . CO2 02/02/2015 23  22 - 32 mmol/L Final  . Glucose, Bld 02/02/2015 180* 65 - 99 mg/dL Final  . BUN 02/02/2015 32* 6 - 20 mg/dL Final  . Creatinine, Ser 02/02/2015 1.37* 0.44 - 1.00 mg/dL Final  . Calcium 02/02/2015 7.7* 8.9 - 10.3 mg/dL Final  . GFR calc non Af Amer 02/02/2015 38* >60 mL/min Final  . GFR calc Af Amer 02/02/2015 44* >60 mL/min Final   Comment: (NOTE) The eGFR has been calculated using the CKD EPI equation. This calculation has not been validated in all clinical situations. eGFR's persistently <60 mL/min signify possible Chronic Kidney Disease.   . Anion gap 02/02/2015 6  5 - 15 Final  . WBC 02/02/2015 3.2* 3.6 - 11.0 K/uL Final  . RBC 02/02/2015 3.16* 3.80 - 5.20 MIL/uL Final  . Hemoglobin 02/02/2015 9.1* 12.0 - 16.0 g/dL Final  . HCT 02/02/2015 27.4* 35.0 - 47.0 % Final  . MCV 02/02/2015 86.8  80.0 - 100.0 fL Final  . MCH 02/02/2015 28.8  26.0 - 34.0 pg Final  . MCHC 02/02/2015 33.2  32.0 - 36.0 g/dL Final  . RDW 02/02/2015 18.6* 11.5 - 14.5 % Final  . Platelets 02/02/2015 296  150 - 440 K/uL Final  . Glucose-Capillary 02/01/2015 157* 65 - 99 mg/dL Final  . Iron 02/02/2015 29  28 - 170 ug/dL Final  . TIBC 02/02/2015 NOT CALCULATED  250 - 450 ug/dL Final  . Saturation Ratios 02/02/2015 NOT CALCULATED  10.4 - 31.8 % Final  .  UIBC 02/02/2015 NOT CALCULATED   Final  . Ferritin 02/02/2015 624* 11 - 307 ng/mL Final  . Glucose-Capillary 02/02/2015 150* 65 - 99 mg/dL Final  . Comment 1 02/02/2015 Notify RN   Final  . Glucose-Capillary 02/02/2015 152* 65 - 99 mg/dL Final  . Comment 1 02/02/2015 Notify RN   Final  . Glucose-Capillary 02/02/2015 125* 65 - 99 mg/dL Final  . Comment 1 02/02/2015 Notify RN   Final    Assessment:  [name] is a 69 y.o. female for chronic anemia since at least 2015.  She has required IV iron in the past and has also has been on oral iron.  She received 1 unit of PRBCs for a hemoglobin on  7 during an admission on 01/29/2015 for cellulitis of the right great toe.  EGD on 09/24/2014 revealed a normal esophagus, gastritis, and normal jejunum.  Colonoscopy on 11/12/2014 revealed only internal and external hemorrhoids.  She has a history of subdural hematoma in 12/2014 requiring burr holes and drainage.  She denies any bleeding.    Diet is modest.  She has stage III chronic kidney disease.  She denies any issues with recurrent infections.  She denies any bone pain.  She notes a recent history of weight loss.  Plan: 1. Discuss differential diagnosis of anemia.  Discuss baseline blood tests.  Discuss 24 hour urine. 2. Labs today: CBC with diff, B12, folate, TSH, ferritin, iron studies, SPEP, free light chains  3. Collect 24 hour urine for UPEP and free light chains 4. RTC in 7-10 days for MD assess and review of work-up   Lequita Asal, MD  02/21/2015, 4:35 PM

## 2015-02-22 ENCOUNTER — Telehealth: Payer: Self-pay

## 2015-02-22 ENCOUNTER — Other Ambulatory Visit: Payer: Self-pay | Admitting: Hematology and Oncology

## 2015-02-22 DIAGNOSIS — E538 Deficiency of other specified B group vitamins: Secondary | ICD-10-CM

## 2015-02-22 LAB — PROTEIN ELECTROPHORESIS, SERUM
A/G Ratio: 0.7 (ref 0.7–1.7)
Albumin ELP: 2.5 g/dL — ABNORMAL LOW (ref 2.9–4.4)
Alpha-1-Globulin: 0.3 g/dL (ref 0.0–0.4)
Alpha-2-Globulin: 0.5 g/dL (ref 0.4–1.0)
Beta Globulin: 0.7 g/dL (ref 0.7–1.3)
Gamma Globulin: 2.1 g/dL — ABNORMAL HIGH (ref 0.4–1.8)
Globulin, Total: 3.6 g/dL (ref 2.2–3.9)
Total Protein ELP: 6.1 g/dL (ref 6.0–8.5)

## 2015-02-22 LAB — KAPPA/LAMBDA LIGHT CHAINS
Kappa free light chain: 157.45 mg/L — ABNORMAL HIGH (ref 3.30–19.40)
Kappa, lambda light chain ratio: 1.27 (ref 0.26–1.65)
Lambda free light chains: 123.83 mg/L — ABNORMAL HIGH (ref 5.71–26.30)

## 2015-02-22 MED ORDER — FOLIC ACID 1 MG PO TABS: 1.0000 mg | ORAL_TABLET | Freq: Every day | ORAL | Status: AC

## 2015-02-22 NOTE — Telephone Encounter (Signed)
Erroneous note encounter  

## 2015-02-22 NOTE — Telephone Encounter (Addendum)
Called pt regarding her low potassium, folic acid needing to be pick up at pharmacy, and the need to follow up with her pcp regarding someone to monitor her labs and medication.  I told pt I would be calling her primary and did so at [phone] and left message for office to return my phone.  Asked pt to also call pcp if she did not hear from them.  Pt verbalized an understanding

## 2015-02-23 DIAGNOSIS — N183 Chronic kidney disease, stage 3 (moderate): Secondary | ICD-10-CM | POA: Diagnosis not present

## 2015-02-25 ENCOUNTER — Other Ambulatory Visit: Payer: Self-pay | Admitting: *Deleted

## 2015-02-25 DIAGNOSIS — N183 Chronic kidney disease, stage 3 unspecified: Secondary | ICD-10-CM

## 2015-02-26 ENCOUNTER — Encounter
Admission: RE | Admit: 2015-02-26 | Discharge: 2015-02-26 | Disposition: A | Discharge status: Home / Self Care | Payer: Medicare HMO | Source: Ambulatory Visit | Attending: Podiatry | Admitting: Podiatry

## 2015-02-26 DIAGNOSIS — M5136 Other intervertebral disc degeneration, lumbar region: Secondary | ICD-10-CM | POA: Diagnosis not present

## 2015-02-26 DIAGNOSIS — N183 Chronic kidney disease, stage 3 (moderate): Secondary | ICD-10-CM | POA: Diagnosis not present

## 2015-02-26 DIAGNOSIS — I509 Heart failure, unspecified: Secondary | ICD-10-CM | POA: Diagnosis not present

## 2015-02-26 DIAGNOSIS — Z8249 Family history of ischemic heart disease and other diseases of the circulatory system: Secondary | ICD-10-CM | POA: Diagnosis not present

## 2015-02-26 DIAGNOSIS — I2510 Atherosclerotic heart disease of native coronary artery without angina pectoris: Secondary | ICD-10-CM | POA: Diagnosis not present

## 2015-02-26 DIAGNOSIS — L97514 Non-pressure chronic ulcer of other part of right foot with necrosis of bone: Secondary | ICD-10-CM | POA: Diagnosis not present

## 2015-02-26 DIAGNOSIS — E1122 Type 2 diabetes mellitus with diabetic chronic kidney disease: Secondary | ICD-10-CM | POA: Diagnosis not present

## 2015-02-26 DIAGNOSIS — K219 Gastro-esophageal reflux disease without esophagitis: Secondary | ICD-10-CM | POA: Diagnosis not present

## 2015-02-26 DIAGNOSIS — Z7984 Long term (current) use of oral hypoglycemic drugs: Secondary | ICD-10-CM | POA: Diagnosis not present

## 2015-02-26 DIAGNOSIS — M869 Osteomyelitis, unspecified: Secondary | ICD-10-CM | POA: Diagnosis not present

## 2015-02-26 DIAGNOSIS — Z86010 Personal history of colonic polyps: Secondary | ICD-10-CM | POA: Diagnosis not present

## 2015-02-26 DIAGNOSIS — I130 Hypertensive heart and chronic kidney disease with heart failure and stage 1 through stage 4 chronic kidney disease, or unspecified chronic kidney disease: Secondary | ICD-10-CM | POA: Diagnosis not present

## 2015-02-26 DIAGNOSIS — E7800 Pure hypercholesterolemia, unspecified: Secondary | ICD-10-CM | POA: Diagnosis not present

## 2015-02-26 DIAGNOSIS — Z885 Allergy status to narcotic agent status: Secondary | ICD-10-CM | POA: Diagnosis not present

## 2015-02-26 DIAGNOSIS — I96 Gangrene, not elsewhere classified: Secondary | ICD-10-CM | POA: Diagnosis present

## 2015-02-26 DIAGNOSIS — Z9849 Cataract extraction status, unspecified eye: Secondary | ICD-10-CM | POA: Diagnosis not present

## 2015-02-26 DIAGNOSIS — R634 Abnormal weight loss: Secondary | ICD-10-CM | POA: Diagnosis not present

## 2015-02-26 DIAGNOSIS — Z833 Family history of diabetes mellitus: Secondary | ICD-10-CM | POA: Diagnosis not present

## 2015-02-26 DIAGNOSIS — Z95818 Presence of other cardiac implants and grafts: Secondary | ICD-10-CM | POA: Diagnosis not present

## 2015-02-26 DIAGNOSIS — E11319 Type 2 diabetes mellitus with unspecified diabetic retinopathy without macular edema: Secondary | ICD-10-CM | POA: Diagnosis not present

## 2015-02-26 HISTORY — DX: Angina pectoris, unspecified: I20.9

## 2015-02-26 HISTORY — DX: Hemangioma of intra-abdominal structures: D18.03

## 2015-02-26 HISTORY — DX: Cardiomyopathy, unspecified: I42.9

## 2015-02-26 HISTORY — DX: Unspecified background retinopathy: H35.00

## 2015-02-26 HISTORY — DX: Abnormal weight loss: R63.4

## 2015-02-26 HISTORY — DX: Dysphonia: R49.0

## 2015-02-26 HISTORY — DX: Gastro-esophageal reflux disease without esophagitis: K21.9

## 2015-02-26 HISTORY — DX: Occlusion and stenosis of unspecified carotid artery: I65.29

## 2015-02-26 HISTORY — DX: Unspecified osteoarthritis, unspecified site: M19.90

## 2015-02-26 LAB — UPEP/TP, 24-HR URINE
Albumin, U: 38.5 %
Alpha 1, Urine: 4.9 %
Alpha 2, Urine: 10.7 %
Beta, Urine: 26.6 %
Gamma Globulin, Urine: 19.2 %
Total Protein, Urine-Ur/day: 514.8 mg/24 hr — ABNORMAL HIGH (ref 30.0–150.0)
Total Protein, Urine: 46.8 mg/dL
Total Volume: 1100

## 2015-02-26 LAB — POTASSIUM: POTASSIUM: 3.9 mmol/L (ref 3.5–5.1)

## 2015-02-26 NOTE — Patient Instructions (Signed)
  Your procedure is scheduled on:02/19/15 Report to Day Surgery.MEDICAL [facility]ALL To find out your arrival time please call [phone] between 1PM - 3PM on 02/18/15  Remember: Instructions that are not followed completely may result in serious medical risk, up to and including death, or upon the discretion of your surgeon and anesthesiologist your surgery may need to be rescheduled.    _X___ 1. Do not eat food or drink liquids after midnight. No gum chewing or hard candies.     _X___ 2. No Alcohol for 24 hours before or after surgery.   ____ 3. Bring all medications with you on the day of surgery if instructed.    _X___ 4. Notify your doctor if there is any change in your medical condition     (cold, fever, infections).     Do not wear jewelry, make-up, hairpins, clips or nail polish.  Do not wear lotions, powders, or perfumes. You may wear deodorant.  Do not shave 48 hours prior to surgery. Men may shave face and neck.  Do not bring valuables to the hospital.    [facility] is not responsible for any belongings or valuables.               Contacts, dentures or bridgework may not be worn into surgery.  Leave your suitcase in the car. After surgery it may be brought to your room.  For patients admitted to the hospital, discharge time is determined by your                treatment team.   Patients discharged the day of surgery will not be allowed to drive home.   Please read over the following fact sheets that you were given:   Surgical Site Infection Prevention   ____ Take these medicines the morning of surgery with A SIP OF WATER:    1. HYDRALAZINE  2. ISOSORBIDE  3. OMEPRAZOLE  4.PROPRANOLOL  5.RAMIPRIL             6. LABETALOL  6.  ____ Fleet Enema (as directed)   _X___ Use CHG Soap as directed  ____ Use inhalers on the day of surgery  ____ Stop metformin 2 days prior to surgery    ____ Take 1/2 of usual insulin dose the night before surgery and none on the morning of  surgery.   ____ Stop Coumadin/Plavix/aspirin on   ____ Stop Anti-inflammatories on   ____ Stop supplements until after surgery.    ____ Bring C-Pap to the hospital.

## 2015-02-26 NOTE — OR Nursing (Signed)
Cleared by Dr Clayborn Bigness

## 2015-03-01 ENCOUNTER — Ambulatory Visit: Payer: Medicare HMO | Admitting: Anesthesiology

## 2015-03-01 ENCOUNTER — Encounter: Payer: Self-pay | Admitting: *Deleted

## 2015-03-01 ENCOUNTER — Encounter
Admission: RE | Disposition: A | Discharge status: Home / Self Care | Payer: Self-pay | Source: Ambulatory Visit | Attending: Podiatry

## 2015-03-01 ENCOUNTER — Ambulatory Visit
Admission: RE | Admit: 2015-03-01 | Discharge: 2015-03-01 | Disposition: A | Discharge status: Home / Self Care | Payer: Medicare HMO | Source: Ambulatory Visit | Attending: Podiatry | Admitting: Podiatry

## 2015-03-01 DIAGNOSIS — Z95818 Presence of other cardiac implants and grafts: Secondary | ICD-10-CM | POA: Insufficient documentation

## 2015-03-01 DIAGNOSIS — E1122 Type 2 diabetes mellitus with diabetic chronic kidney disease: Secondary | ICD-10-CM | POA: Insufficient documentation

## 2015-03-01 DIAGNOSIS — Z885 Allergy status to narcotic agent status: Secondary | ICD-10-CM | POA: Insufficient documentation

## 2015-03-01 DIAGNOSIS — L97514 Non-pressure chronic ulcer of other part of right foot with necrosis of bone: Secondary | ICD-10-CM | POA: Insufficient documentation

## 2015-03-01 DIAGNOSIS — E7800 Pure hypercholesterolemia, unspecified: Secondary | ICD-10-CM | POA: Insufficient documentation

## 2015-03-01 DIAGNOSIS — I96 Gangrene, not elsewhere classified: Secondary | ICD-10-CM | POA: Insufficient documentation

## 2015-03-01 DIAGNOSIS — Z7984 Long term (current) use of oral hypoglycemic drugs: Secondary | ICD-10-CM | POA: Insufficient documentation

## 2015-03-01 DIAGNOSIS — Z86010 Personal history of colonic polyps: Secondary | ICD-10-CM | POA: Insufficient documentation

## 2015-03-01 DIAGNOSIS — R634 Abnormal weight loss: Secondary | ICD-10-CM | POA: Insufficient documentation

## 2015-03-01 DIAGNOSIS — K219 Gastro-esophageal reflux disease without esophagitis: Secondary | ICD-10-CM | POA: Insufficient documentation

## 2015-03-01 DIAGNOSIS — Z833 Family history of diabetes mellitus: Secondary | ICD-10-CM | POA: Insufficient documentation

## 2015-03-01 DIAGNOSIS — I2510 Atherosclerotic heart disease of native coronary artery without angina pectoris: Secondary | ICD-10-CM | POA: Insufficient documentation

## 2015-03-01 DIAGNOSIS — E11319 Type 2 diabetes mellitus with unspecified diabetic retinopathy without macular edema: Secondary | ICD-10-CM | POA: Insufficient documentation

## 2015-03-01 DIAGNOSIS — I509 Heart failure, unspecified: Secondary | ICD-10-CM | POA: Insufficient documentation

## 2015-03-01 DIAGNOSIS — M5136 Other intervertebral disc degeneration, lumbar region: Secondary | ICD-10-CM | POA: Insufficient documentation

## 2015-03-01 DIAGNOSIS — Z8249 Family history of ischemic heart disease and other diseases of the circulatory system: Secondary | ICD-10-CM | POA: Insufficient documentation

## 2015-03-01 DIAGNOSIS — N183 Chronic kidney disease, stage 3 (moderate): Secondary | ICD-10-CM | POA: Insufficient documentation

## 2015-03-01 DIAGNOSIS — I130 Hypertensive heart and chronic kidney disease with heart failure and stage 1 through stage 4 chronic kidney disease, or unspecified chronic kidney disease: Secondary | ICD-10-CM | POA: Insufficient documentation

## 2015-03-01 DIAGNOSIS — Z9849 Cataract extraction status, unspecified eye: Secondary | ICD-10-CM | POA: Insufficient documentation

## 2015-03-01 DIAGNOSIS — M869 Osteomyelitis, unspecified: Secondary | ICD-10-CM | POA: Insufficient documentation

## 2015-03-01 HISTORY — PX: AMPUTATION TOE: SHX6595

## 2015-03-01 LAB — GLUCOSE, CAPILLARY
Glucose-Capillary: 56 mg/dL — ABNORMAL LOW (ref 65–99)
Glucose-Capillary: 107 mg/dL — ABNORMAL HIGH (ref 65–99)
Glucose-Capillary: 64 mg/dL — ABNORMAL LOW (ref 65–99)
Glucose-Capillary: 73 mg/dL (ref 65–99)
Glucose-Capillary: 84 mg/dL (ref 65–99)

## 2015-03-01 SURGERY — AMPUTATION, TOE
Anesthesia: Monitor Anesthesia Care | Laterality: Right | Wound class: Dirty or Infected

## 2015-03-01 MED ORDER — HYDROCODONE-ACETAMINOPHEN 5-325 MG PO TABS: 1.0000 | ORAL_TABLET | ORAL | Status: DC | PRN

## 2015-03-01 MED ORDER — PROPOFOL 10 MG/ML IV BOLUS
INTRAVENOUS | Status: DC | PRN
  Administered 2015-03-01: 40 mg via INTRAVENOUS

## 2015-03-01 MED ORDER — ONDANSETRON HCL 4 MG PO TABS: 4.0000 mg | ORAL_TABLET | Freq: Four times a day (QID) | ORAL | Status: DC | PRN

## 2015-03-01 MED ORDER — DEXTROSE 50 % IV SOLN
12.5000 g | Freq: Once | INTRAVENOUS | Status: AC
  Administered 2015-03-01: 12.5 g via INTRAVENOUS

## 2015-03-01 MED ORDER — CEFAZOLIN SODIUM-DEXTROSE 2-3 GM-% IV SOLR
INTRAVENOUS | Status: AC
  Filled 2015-03-01: qty 50

## 2015-03-01 MED ORDER — HYDROCODONE-ACETAMINOPHEN 5-325 MG PO TABS: 1.0000 | ORAL_TABLET | Freq: Four times a day (QID) | ORAL | Status: DC | PRN

## 2015-03-01 MED ORDER — LIDOCAINE HCL (PF) 1 % IJ SOLN
INTRAMUSCULAR | Status: DC | PRN
  Administered 2015-03-01: 5 mL

## 2015-03-01 MED ORDER — FENTANYL CITRATE (PF) 100 MCG/2ML IJ SOLN: 25.0000 ug | INTRAMUSCULAR | Status: DC | PRN

## 2015-03-01 MED ORDER — AMOXICILLIN-POT CLAVULANATE 875-125 MG PO TABS: 1.0000 | ORAL_TABLET | Freq: Two times a day (BID) | ORAL | Status: DC

## 2015-03-01 MED ORDER — DEXTROSE 50 % IV SOLN
INTRAVENOUS | Status: AC
  Administered 2015-03-01: 12.5 g via INTRAVENOUS
  Filled 2015-03-01: qty 50

## 2015-03-01 MED ORDER — DEXTROSE-NACL 5-0.9 % IV SOLN
INTRAVENOUS | Status: DC
  Administered 2015-03-01: 07:00:00 via INTRAVENOUS

## 2015-03-01 MED ORDER — ONDANSETRON HCL 4 MG/2ML IJ SOLN: 4.0000 mg | Freq: Once | INTRAMUSCULAR | Status: DC | PRN

## 2015-03-01 MED ORDER — MIDAZOLAM HCL 2 MG/2ML IJ SOLN
INTRAMUSCULAR | Status: DC | PRN
  Administered 2015-03-01: 1 mg via INTRAVENOUS

## 2015-03-01 MED ORDER — BUPIVACAINE HCL (PF) 0.5 % IJ SOLN
INTRAMUSCULAR | Status: AC
  Filled 2015-03-01: qty 30

## 2015-03-01 MED ORDER — SODIUM CHLORIDE 0.9 % IV SOLN
INTRAVENOUS | Status: DC
  Administered 2015-03-01: 08:00:00 via INTRAVENOUS

## 2015-03-01 MED ORDER — LIDOCAINE HCL (PF) 1 % IJ SOLN
INTRAMUSCULAR | Status: AC
  Filled 2015-03-01: qty 30

## 2015-03-01 MED ORDER — FENTANYL CITRATE (PF) 100 MCG/2ML IJ SOLN
INTRAMUSCULAR | Status: DC | PRN
  Administered 2015-03-01: 50 ug via INTRAVENOUS

## 2015-03-01 MED ORDER — BUPIVACAINE HCL 0.5 % IJ SOLN
INTRAMUSCULAR | Status: DC | PRN
  Administered 2015-03-01: 5 mL

## 2015-03-01 MED ORDER — LIDOCAINE HCL 2 % EX GEL
CUTANEOUS | Status: DC | PRN
  Administered 2015-03-01: 1 via TOPICAL

## 2015-03-01 MED ORDER — CEFAZOLIN SODIUM-DEXTROSE 2-3 GM-% IV SOLR
2.0000 g | INTRAVENOUS | Status: AC
  Administered 2015-03-01: 2 g via INTRAVENOUS

## 2015-03-01 MED ORDER — ONDANSETRON HCL 4 MG/2ML IJ SOLN: 4.0000 mg | Freq: Four times a day (QID) | INTRAMUSCULAR | Status: DC | PRN

## 2015-03-01 SURGICAL SUPPLY — 47 items
BANDAGE ELASTIC 4 CLIP NS LF (GAUZE/BANDAGES/DRESSINGS) ×2 IMPLANT
BANDAGE STRETCH 3X4.1 STRL (GAUZE/BANDAGES/DRESSINGS) ×3 IMPLANT
BLADE OSC/SAGITTAL MD 5.5X18 (BLADE) ×2 IMPLANT
BLADE SURG MINI STRL (BLADE) ×2 IMPLANT
BNDG CMPR 75X21 PLY HI ABS (MISCELLANEOUS)
BNDG ESMARK 4X12 TAN STRL LF (GAUZE/BANDAGES/DRESSINGS) ×2 IMPLANT
BNDG GAUZE 4.5X4.1 6PLY STRL (MISCELLANEOUS) ×2 IMPLANT
BRUSH SCRUB 4% CHG (MISCELLANEOUS) ×1 IMPLANT
CANISTER SUCT 1200ML W/VALVE (MISCELLANEOUS) ×2 IMPLANT
CUFF TOURN 18 STER (MISCELLANEOUS) ×1 IMPLANT
DRAPE FLUOR MINI C-ARM 54X84 (DRAPES) ×2 IMPLANT
DRAPE XRAY CASSETTE 23X24 (DRAPES) ×1 IMPLANT
DURAPREP 26ML APPLICATOR (WOUND CARE) ×1 IMPLANT
GAUZE IODOFORM PACK 1/2 7832 (GAUZE/BANDAGES/DRESSINGS) ×1 IMPLANT
GAUZE PETRO XEROFOAM 1X8 (MISCELLANEOUS) ×1 IMPLANT
GAUZE SPONGE 4X4 12PLY STRL (GAUZE/BANDAGES/DRESSINGS) ×2 IMPLANT
GAUZE STRETCH 2X75IN STRL (MISCELLANEOUS) ×1 IMPLANT
GLOVE BIO SURGEON STRL SZ7.5 (GLOVE) ×3 IMPLANT
GLOVE INDICATOR 8.0 STRL GRN (GLOVE) ×3 IMPLANT
GOWN STRL REUS W/ TWL LRG LVL3 (GOWN DISPOSABLE) ×2 IMPLANT
GOWN STRL REUS W/TWL LRG LVL3 (GOWN DISPOSABLE) ×4
HANDPIECE SUCTION TUBG SURGILV (MISCELLANEOUS) ×1 IMPLANT
KIT RM TURNOVER STRD PROC AR (KITS) ×2 IMPLANT
LABEL OR SOLS (LABEL) ×1 IMPLANT
NDL FILTER BLUNT 18X1 1/2 (NEEDLE) ×1 IMPLANT
NDL HYPO 25X1 1.5 SAFETY (NEEDLE) ×1 IMPLANT
NEEDLE FILTER BLUNT 18X 1/2SAF (NEEDLE) ×1
NEEDLE FILTER BLUNT 18X1 1/2 (NEEDLE) ×1 IMPLANT
NEEDLE HYPO 25X1 1.5 SAFETY (NEEDLE) ×2 IMPLANT
NS IRRIG 500ML POUR BTL (IV SOLUTION) ×2 IMPLANT
PACK EXTREMITY ARMC (MISCELLANEOUS) ×2 IMPLANT
PAD ABD DERMACEA PRESS 5X9 (GAUZE/BANDAGES/DRESSINGS) ×2 IMPLANT
PAD GROUND ADULT SPLIT (MISCELLANEOUS) ×2 IMPLANT
PREP PVP WINGED SPONGE (MISCELLANEOUS) ×1 IMPLANT
SOL .9 NS 3000ML IRR  AL (IV SOLUTION)
SOL .9 NS 3000ML IRR AL (IV SOLUTION)
SOL .9 NS 3000ML IRR UROMATIC (IV SOLUTION) ×1 IMPLANT
SOL PREP PVP 2OZ (MISCELLANEOUS) ×2
SOLUTION PREP PVP 2OZ (MISCELLANEOUS) IMPLANT
STOCKINETTE M/LG 89821 (MISCELLANEOUS) ×2 IMPLANT
STRAP SAFETY BODY (MISCELLANEOUS) ×2 IMPLANT
SUT ETHILON 3-0 FS-10 30 BLK (SUTURE) ×2
SUT ETHILON 5-0 FS-2 18 BLK (SUTURE) ×1 IMPLANT
SUT VIC AB 4-0 FS2 27 (SUTURE) ×1 IMPLANT
SUTURE EHLN 3-0 FS-10 30 BLK (SUTURE) ×1 IMPLANT
SWAB CULTURE AMIES ANAERIB BLU (MISCELLANEOUS) ×1 IMPLANT
SYRINGE 10CC LL (SYRINGE) ×4 IMPLANT

## 2015-03-01 NOTE — H&P (Signed)
  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  The various methods of treatment have been discussed with the patient.  No guarantees were given.  After consideration of risks, benefits and other options for treatment, the patient has consented to surgery.  I have reviewed the patients' chart and labs.    Patient Vitals for the past 24 hrs:  BP Temp Temp src Pulse Resp SpO2 Height Weight  03/01/15 0610 (!) 148/60 mmHg (!) 96.9 F (36.1 C) Oral 70 14 100 % 5\' 7"  (1.702 m) 61.236 kg (135 lb)    A history and physical examination was performed in my office.  The patient was reexamined.  There have been no changes to this history and physical examination.Plan for amputation right great toe.  [name] A

## 2015-03-01 NOTE — Progress Notes (Signed)
Blood sugar at 8:44   64  12.5mg  of d50w given     Blood sugar at 9:28  73   Gave pt cup of orange juice  Okay to send pt to same day surgery area   Per dr rice  Pt doing well

## 2015-03-01 NOTE — Anesthesia Procedure Notes (Signed)
Procedure Name: MAC Date/Time: 03/01/2015 7:39 AM Performed by: Doreen Salvage Pre-anesthesia Checklist: Patient identified, Emergency Drugs available, Suction available and Patient being monitored Patient Re-evaluated:Patient Re-evaluated prior to inductionOxygen Delivery Method: Simple face mask Tube size: 9.0 mm Dental Injury: Teeth and Oropharynx as per pre-operative assessment

## 2015-03-01 NOTE — Anesthesia Preprocedure Evaluation (Signed)
Anesthesia Evaluation  Patient identified by MRN, date of birth, ID band Patient awake    Reviewed: Allergy & Precautions, NPO status , Patient's Chart, lab work & pertinent test results  Airway Mallampati: II  TM Distance: >3 FB Neck ROM: Limited    Dental  (+) Upper Dentures, Lower Dentures   Pulmonary    breath sounds clear to auscultation       Cardiovascular Exercise Tolerance: Poor hypertension, + angina with exertion + CAD, + Peripheral Vascular Disease and +CHF   Rhythm:Regular Rate:Normal     Neuro/Psych    GI/Hepatic GERD  Medicated and Controlled,  Endo/Other  diabetes, Type 2BG 107.  Renal/GU CRFRenal disease     Musculoskeletal   Abdominal (+)  Abdomen: soft.    Peds  Hematology  (+) anemia , Hb 9.1.   Anesthesia Other Findings   Reproductive/Obstetrics                             Anesthesia Physical Anesthesia Plan  ASA: IV  Anesthesia Plan: MAC   Post-op Pain Management:    Induction: Intravenous  Airway Management Planned: Simple Face Mask  Additional Equipment:   Intra-op Plan:   Post-operative Plan:   Informed Consent: I have reviewed the patients History and Physical, chart, labs and discussed the procedure including the risks, benefits and alternatives for the proposed anesthesia with the patient or authorized representative who has indicated his/her understanding and acceptance.     Plan Discussed with: CRNA  Anesthesia Plan Comments:         Anesthesia Quick Evaluation

## 2015-03-01 NOTE — Transfer of Care (Signed)
  Immediate Anesthesia Transfer of Care Note  Patient: [name]  Procedure(s) Performed: Procedure(s): AMPUTATION  RIGHT GREAT TOE (Right)  Patient Location: PACU  Anesthesia Type:General  Level of Consciousness: sedated  Airway & Oxygen Therapy: Patient Spontanous Breathing and Patient connected to face mask oxygen  Post-op Assessment: Report given to RN and Post -op Vital signs reviewed and stable  Post vital signs: Reviewed and stable  Last Vitals:  Filed Vitals:   03/01/15 0844  BP: 167/62  Pulse:   Temp: 36.6 C  Resp: 14    Complications: No apparent anesthesia complications

## 2015-03-01 NOTE — Progress Notes (Signed)
Upon arrival to same day surgery, capillary blood sugar at 0615 am was 56. Patient denies any hypoglycemic symptoms at this time. Anesthesia notified and an order was obtained. Recheck blood sugar at 0650 was 107

## 2015-03-01 NOTE — Anesthesia Postprocedure Evaluation (Signed)
  Anesthesia Post-op Note  Patient: [name]  Procedure(s) Performed: Procedure(s): AMPUTATION  RIGHT GREAT TOE (Right)  Anesthesia type:MAC  Patient location: PACU  Post pain: Pain level controlled  Post assessment: Post-op Vital signs reviewed, Patient's Cardiovascular Status Stable, Respiratory Function Stable, Patent Airway and No signs of Nausea or vomiting  Post vital signs: Reviewed and stable  Last Vitals:  Filed Vitals:   03/01/15 0930  BP:   Pulse: 65  Temp:   Resp: 16    Level of consciousness: awake, alert  and patient cooperative  Complications: No apparent anesthesia complications

## 2015-03-01 NOTE — Op Note (Signed)
Operative note   Surgeon:Allycia Pitz Lawyer: None    Preop diagnosis: Gangrene right great toe    Postop diagnosis: Gangrene right great toe    Procedure: Amputation right great toe MTPJ    EBL: 10 ML's    Anesthesia:local and IV sedation    Hemostasis: Ankle tourniquet inflated to 250 mmHg for 10 minutes    Specimen: Gangrenous toe for pathology and wound culture    Complications: None    Operative indications: 69 year old female with a worsening ulcer with gangrenous changes of her right great toe. Undergone conservative therapy and presents today for surgery. Risks benefits alternatives and competitions have been discussed and consent was given    Procedure:  Patient was brought into the OR and placed on the operating table in thesupine position. After anesthesia was obtained theright lower extremity was prepped and draped in usual sterile fashion.  A full-thickness incision was made circumferentially around the right great toe with a medial skin flap left in place. The toe was disarticulated at the MTPJ. All bleeders were Bovie cauterized. Was flushed with copious amounts of irrigation. Signs of residual necrotic tissue was noted. The metatarsal head was noted to be intact with the cartilage cap intact. At this time closure was performed with a 3-0 nylon. A medial to lateral skin flap was applied. The redundant skin was excised sharply. Good perfusion of the skin flap was noted. A large bulky sterile dressing was applied to the right foot.    Patient tolerated the procedure and anesthesia well.  Was transported from the OR to the PACU with all vital signs stable and vascular status intact. To be discharged per routine protocol.  Will follow up in approximately 1 week in the outpatient clinic.

## 2015-03-01 NOTE — Discharge Instructions (Signed)
[facility] °[facility] ° °POST OPERATIVE INSTRUCTIONS FOR DR. TROXLER AND DR. FOWLER °[facility] ° ° °1. Take your medication as prescribed.  Pain medication should be taken only as needed. ° °2. Keep the dressing clean, dry and intact. ° °3. Keep your foot elevated above the heart level for the first 48 hours. ° °4. Walking to the bathroom and brief periods of walking are acceptable, unless we have instructed you to be non-weight bearing. ° °5. Always wear your post-op shoe when walking.  Always use your crutches if you are to be non-weight bearing. ° °6. Do not take a shower. Baths are permissible as long as the foot is kept out of the water.  ° °7. Every hour you are awake:  °- Bend your knee 15 times. °- Flex foot 15 times °- Massage calf 15 times ° °8. Call [facility] ([phone]) if any of the following problems occur: °- You develop a temperature or fever. °- The bandage becomes saturated with blood. °- Medication does not stop your pain. °- Injury of the foot occurs. °- Any symptoms of infection including redness, odor, or red streaks running from wound. ° °General Anesthesia, Adult, Care After °Refer to this sheet in the next few weeks. These instructions provide you with information on caring for yourself after your procedure. Your health care provider may also give you more specific instructions. Your treatment has been planned according to current medical practices, but problems sometimes occur. Call your health care provider if you have any problems or questions after your procedure. °WHAT TO EXPECT AFTER THE PROCEDURE °After the procedure, it is typical to experience: °· Sleepiness. °· Nausea and vomiting. °HOME CARE INSTRUCTIONS °· For the first 24 hours after general anesthesia: °¨ Have a responsible person with you. °¨ Do not drive a car. If you are alone, do not take public transportation. °¨ Do not drink alcohol. °¨ Do not take  medicine that has not been prescribed by your health care provider. °¨ Do not sign important papers or make important decisions. °¨ You may resume a normal diet and activities as directed by your health care provider. °· Change bandages (dressings) as directed. °· If you have questions or problems that seem related to general anesthesia, call the hospital and ask for the anesthetist or anesthesiologist on call. °SEEK MEDICAL CARE IF: °· You have nausea and vomiting that continue the day after anesthesia. °· You develop a rash. °SEEK IMMEDIATE MEDICAL CARE IF:  °· You have difficulty breathing. °· You have chest pain. °· You have any allergic problems. °  °This information is not intended to replace advice given to you by your health care provider. Make sure you discuss any questions you have with your health care provider. °  °Document Released: 07/27/2000 Document Revised: 05/11/2014 Document Reviewed: 08/19/2011 °Elsevier Interactive Patient Education ©2016 [employer] ° °

## 2015-03-04 ENCOUNTER — Encounter: Payer: Self-pay | Admitting: Hematology and Oncology

## 2015-03-04 ENCOUNTER — Inpatient Hospital Stay: Payer: Medicare HMO

## 2015-03-04 ENCOUNTER — Inpatient Hospital Stay (HOSPITAL_BASED_OUTPATIENT_CLINIC_OR_DEPARTMENT_OTHER): Payer: Medicare HMO | Admitting: Hematology and Oncology

## 2015-03-04 VITALS — BP 181/72 | HR 71 | Temp 96.8°F | Resp 18 | Ht 67.0 in | Wt 142.0 lb

## 2015-03-04 DIAGNOSIS — N183 Chronic kidney disease, stage 3 unspecified: Secondary | ICD-10-CM

## 2015-03-04 DIAGNOSIS — Z79899 Other long term (current) drug therapy: Secondary | ICD-10-CM | POA: Diagnosis not present

## 2015-03-04 DIAGNOSIS — D631 Anemia in chronic kidney disease: Secondary | ICD-10-CM

## 2015-03-04 DIAGNOSIS — D649 Anemia, unspecified: Secondary | ICD-10-CM

## 2015-03-04 DIAGNOSIS — I129 Hypertensive chronic kidney disease with stage 1 through stage 4 chronic kidney disease, or unspecified chronic kidney disease: Secondary | ICD-10-CM

## 2015-03-04 DIAGNOSIS — E538 Deficiency of other specified B group vitamins: Secondary | ICD-10-CM

## 2015-03-04 DIAGNOSIS — D638 Anemia in other chronic diseases classified elsewhere: Secondary | ICD-10-CM

## 2015-03-04 LAB — TISSUE CULTURE

## 2015-03-04 LAB — SURGICAL PATHOLOGY

## 2015-03-04 NOTE — Progress Notes (Signed)
[facility] day:  03/04/2015  Chief Complaint: [name] is a 69 y.o. female with anemia who is seen for review of initial work-up and discussion regarding direction of therapy.  HPI:  The patient was last seen in the medical oncology clinic on 02/21/2015.  At that time, she was seen for initial consultation.  She had a history of iron deficiency in the past requiring IV iron.  She had a history of a subdural hematoma requiring drainage.  She had stage III chronic kidney disease suggesting possible anemia of chronic kidney disease.  Diet was modest.  Work-up on 02/21/2015 included a hematocrit of 27.2, hemoglobin 9.1, MCV 86, platelets 213,000, WBC 2900 with an ANC of 1500.  Differential was unremarkable.  CMP was notable for a potassium of 2.7, BUN 27, creatinine 1.33, albumen 2.3, calcium 7.8, and protein 6.4. Liver function tests were normal.  Creatinine clearance was 46 ml/min.  Ferritin was 808.  Iron studies included an iron of 21.  B12 was > 7500.  Folate was 4.4 (low).  TSH was 2.970.  SPEP revealed no monoclonal protein.  Kappa free light chains were 157.45, lambda free light chains 123.83 with a ratio of 1.27 (normal).  24 hour UPEP on 02/23/2015 revealed no monoclonal protein.  She was contacted regarding her low potassium and the need to pick up folic acid at the pharmacy.  She was admitted from 01/29/2015 - 02/02/2015 with cellulitis of the right great toe.  Hemoglobin was 7.  She was transfused with 1 unit PRBCs.  She underwent debridement on 02/01/2015.  Wound cultures grew several organisms.  She underwent amputation of the right great toe on 03/01/2015 for gangrene.  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  The soft tissue margin showed inflammation, granulation tissue and thrombosis.  The bony margins were viable.  Symptomatically, she has felt "alright".  She is on folic acid.   Past Medical  History  Diagnosis Date  . Hypertension   . Diabetes mellitus without complication ([place])   . Anemia   . Coronary artery disease   . CHF (congestive heart failure) ([place])   . Glaucoma   . Hyperlipidemia   . Chronic kidney disease   . Neuropathy ([place])   . Chronic anemia   . Stroke ([place])   . Anginal pain ([place])   . Arthritis   . Retinopathy   . Cardiomyopathy ([place])   . Carotid artery stenosis   . Hoarseness, chronic   . GERD (gastroesophageal reflux disease)   . Hemangioma of liver   . Weight loss, unintentional     Past Surgical History  Procedure Laterality Date  . Cardiac catheterization    . Esophagogastroduodenoscopy (egd) with propofol N/A 09/24/2014    Elliott-gastritis & normal Billroth II changes   . Colonoscopy with propofol N/A 09/24/2014    Elliott-Incomplete secondary to prep  . Coronary angioplasty  2016    stent placed  . Bilroth ii procedure    . Colonoscopy with propofol N/A 11/12/2014    Procedure: COLONOSCOPY WITH PROPOFOL;  Surgeon: Manya Silvas, MD;  [facility] ENDOSCOPY;  Service: Endoscopy;  Laterality: N/A;  Trudee Kuster hole Right 12/06/2014    Procedure: Right burr hole  for subdural hematoma;  Surgeon: Newman Pies, MD;  Location: [facility] NEURO ORS;  Service: Neurosurgery;  Laterality: Right;  . Irrigation and debridement foot Right 02/01/2015    Procedure: IRRIGATION AND DEBRIDEMENT FOOT/RIGHT GREAT TOE;  Surgeon: [name], DPM;  Location: [facility] ORS;  Service: Podiatry;  Laterality: Right;  . Eye surgery Right     cataract removed  . Amputation toe Right 03/01/2015    Procedure: AMPUTATION  RIGHT GREAT TOE;  Surgeon: [name], DPM;  Location: [facility] ORS;  Service: Podiatry;  Laterality: Right;    Family History  Problem Relation Age of Onset  . Diabetes Mother   . Heart disease Father   . Diabetes Sister   . Lung cancer Brother   . Cancer Brother   . Diabetes Brother   . Diabetes Sister     Social History:  reports that she has never  smoked. She has never used smokeless tobacco. She reports that she does not drink alcohol or use illicit drugs.    Allergies:  Allergies  Allergen Reactions  . Codeine Other (See Comments)    Reaction:  Hallucinations    Current Medications: Current Outpatient Prescriptions  Medication Sig Dispense Refill  . acetaminophen (TYLENOL) 325 MG tablet Take 2 tablets (650 mg total) by mouth every 4 (four) hours as needed for mild pain (or temp > 99 F).    Marland Kitchen amoxicillin-clavulanate (AUGMENTIN) 875-125 MG tablet Take 1 tablet by mouth 2 (two) times daily. 20 tablet 1  . docusate sodium (COLACE) 100 MG capsule Take 1 capsule (100 mg total) by mouth 2 (two) times daily. 60 capsule 11  . feeding supplement, GLUCERNA SHAKE, (GLUCERNA SHAKE) LIQD Take 237 mLs by mouth 2 (two) times daily between meals. 60 Can 0  . ferrous sulfate 325 (65 FE) MG tablet Take 325 mg by mouth daily.    . folic acid (FOLVITE) 1 MG tablet Take 1 tablet (1 mg total) by mouth daily. 60 tablet 1  . furosemide (LASIX) 40 MG tablet Take 1 tablet (40 mg total) by mouth 2 (two) times daily. 60 tablet 5  . glimepiride (AMARYL) 2 MG tablet Take 1 mg by mouth daily.    . hydrALAZINE (APRESOLINE) 100 MG tablet Take 100 mg by mouth 3 (three) times daily.     Marland Kitchen HYDROcodone-acetaminophen (NORCO) 5-325 MG tablet Take 1 tablet by mouth every 6 (six) hours as needed for moderate pain. 30 tablet 0  . HYDROcodone-acetaminophen (NORCO/VICODIN) 5-325 MG tablet Take 1-2 tablets by mouth every 4 (four) hours as needed for moderate pain. 30 tablet 0  . isosorbide dinitrate (ISORDIL) 30 MG tablet Take 30 mg by mouth 2 (two) times daily.    Marland Kitchen labetalol (NORMODYNE) 100 MG tablet Take 100 mg by mouth.    . nitroGLYCERIN (NITROSTAT) 0.4 MG SL tablet Place 0.4 mg under the tongue every 5 (five) minutes as needed for chest pain.    Marland Kitchen omeprazole (PRILOSEC) 20 MG capsule Take 20 mg by mouth daily.    . potassium chloride SA (K-DUR,KLOR-CON) 20 MEQ tablet  Take 1 tablet (20 mEq total) by mouth daily. 30 tablet 5  . propranolol (INDERAL) 20 MG tablet Take 40 mg by mouth 3 (three) times daily.    . ramipril (ALTACE) 5 MG capsule Take 5 mg by mouth daily.    . simvastatin (ZOCOR) 20 MG tablet Take 20 mg by mouth at bedtime.    . traMADol (ULTRAM) 50 MG tablet Take 0.5-1 tablets (25-50 mg total) by mouth 3 (three) times daily as needed for moderate pain. 10 tablet 0   No current facility-administered medications for this visit.    Review of Systems:  GENERAL:  Feels "alright".  No fevers, sweats  or weight loss. PERFORMANCE STATUS (ECOG):  3 HEENT:  No visual changes, runny nose, sore throat, mouth sores or tenderness. Lungs: No shortness of breath or cough.  No hemoptysis. Cardiac:  No chest pain, palpitations, orthopnea, or PND. GI:  No nausea, vomiting, diarrhea, constipation, melena or hematochezia. GU:  No urgency, frequency, dysuria, or hematuria. Musculoskeletal:  s/p amputation right great toe.  Arthritis.  No muscle tenderness. Extremities:  No pain or swelling. Skin:  No rashes or skin changes. Neuro:  No headache, numbness or weakness, balance or coordination issues. Endocrine:  No diabetes, thyroid issues, hot flashes or night sweats. Psych:  No mood changes, depression or anxiety. Pain:  No focal pain. Review of systems:  All other systems reviewed and found to be negative.  Physical Exam: Blood pressure 181/72, pulse 71, temperature 96.8 F (36 C), temperature source Tympanic, weight 141 lb 15.6 oz (64.4 kg). GENERAL:  Well developed, well nourished, sitting comfortably in the exam room in no acute distress.  She has a rolling walker at her side. MENTAL STATUS:  Alert and oriented to person, place and time. HEAD:  Short gray hair.  Normocephalic, atraumatic, face symmetric, no Cushingoid features. EYES:  Brown eyes.  Pupils equal round and reactive to light and accomodation.  No conjunctivitis or scleral icterus. ENT:  Hoarse.   Oropharynx clear without lesion.  Tongue normal. Mucous membranes moist.  RESPIRATORY:  Clear to auscultation without rales, wheezes or rhonchi. CARDIOVASCULAR:  Regular rate and rhythm without murmur, rub or gallop. ABDOMEN:  Soft, non-tender, with active bowel sounds, and no hepatosplenomegaly.  No masses. SKIN:  No rashes, ulcers or lesions. EXTREMITIES: Right lower extremity edema.  No skin discoloration or tenderness.  No palpable cords. LYMPH NODES: No palpable cervical, supraclavicular, axillary or inguinal adenopathy  NEUROLOGICAL: Unremarkable. PSYCH:  Appropriate.  No visits with results within 3 Day(s) from this visit. Latest known visit with results is:  Admission on 03/01/2015, Discharged on 03/01/2015  Component Date Value Ref Range Status  . Glucose-Capillary 03/01/2015 56* 65 - 99 mg/dL Final  . Glucose-Capillary 03/01/2015 107* 65 - 99 mg/dL Final  . Glucose-Capillary 03/01/2015 64* 65 - 99 mg/dL Final  . Comment 1 03/01/2015 Call MD NNP PA CNM   Final  . Specimen Description 03/01/2015 TOE   Final  . Special Requests 03/01/2015 NONE   Final  . Culture 03/01/2015 HOLDING FOR POSSIBLE ANAEROBE   Final  . Report Status 03/01/2015 PENDING   Incomplete  . SURGICAL PATHOLOGY 03/01/2015    Final                   Value:Surgical Pathology CASE: [id number] PATIENT: [name] Surgical Pathology Report     SPECIMEN SUBMITTED: A. Great toe, right  CLINICAL HISTORY: None provided  PRE-OPERATIVE DIAGNOSIS: Ulcer of left foot with necrosis of bone  POST-OPERATIVE DIAGNOSIS: Gangrene right great toe     DIAGNOSIS: A. RIGHT GREAT TOE; AMPUTATION: - SKIN AND SOFT TISSUE WITH ULCERATION AND NECROSIS. - UNDERLYING BONE WITH OSTEOMYELITIS. - THE SOFT TISSUE MARGIN SHOWS INFLAMMATION, GRANULATION TISSUE AND THROMBOSIS. - THE BONY MARGINS ARE VIABLE.   GROSS DESCRIPTION: A. Labeled: Right great toe  Size: 6.5 x 1.8 x 2 cm  Description of lesion(s): The  skin surface on the lateral aspect shows a discolored dark purple ulcerated area with areas of pale tan softening that measures 2.7 x 2.2 cm in greatest dimensions. These changes appear to extend into the underlying soft tissue and reach the  underlying bone.  Proximal margin: There is a defect in the skin and soft tissu                         e at the proximal margin measuring 1.2 cm in length where overlying skin is missing, however the visible skin and soft tissue appears grossly viable.  Bone: The bone underlying the ulcer is markedly softened as well as the bone at the proximal margin resection.  Other findings: The toenail is thickened and discolored yellow  Block summary: 1-skin and soft tissue at margin of resection, and soft tissue of questionable viability at proximal margin (blue) 2-skin and soft tissue from ulcerated area 3-bone underlying ulcer, after decalcification 4-bone at proximal margin resection, after decalcification    Final Diagnosis performed by Delorse Lek, MD.  Electronically signed 03/04/2015 12:57:35PM    The electronic signature indicates that the named Attending Pathologist has evaluated the specimen  Technical component performed at [facility], [address], [place], [place] [address] Lab: [phone] Dir: Darrick Penna. Evette Doffing, MD  Professional component performed at [facility], [facility], [place], [place], [place] [address] Lab: [phone] Dir: Dellia Nims. Rubinas, MD    . Glucose-Capillary 03/01/2015 73  65 - 99 mg/dL Final  . Specimen Description 03/01/2015 TOE   Final  . Special Requests 03/01/2015 NONE   Final  . Gram Stain 03/01/2015    Final                   Value:FEW WBC SEEN FEW YEAST FEW GRAM NEGATIVE RODS RARE GRAM POSITIVE RODS   . Culture 03/01/2015 MODERATE GROWTH STENOTROPHOMONAS MALTOPHILIA   Final  . Report Status 03/01/2015 03/04/2015 FINAL   Final  . Organism ID,  Bacteria 03/01/2015 STENOTROPHOMONAS MALTOPHILIA   Final  . Glucose-Capillary 03/01/2015 84  65 - 99 mg/dL Final    Assessment:  [name] is a 69 y.o. female with chronic anemia since at least 2015. She has required IV iron in the past and has also has been on oral iron. She received 1 unit of PRBCs for a hemoglobin on 7 during an admission on 01/29/2015 for cellulitis of the right great toe.  She folate deficiency and likely has anemia of chronic disease.  Diet is modest.  EGD on 09/24/2014 revealed a normal esophagus, gastritis, and normal jejunum. Colonoscopy on 11/12/2014 revealed only internal and external hemorrhoids. She has a history of subdural hematoma in 12/2014 requiring burr holes and drainage. She denies any bleeding.   Work-up on 02/21/2015 documented folate deficiency.  Labs included a hematocrit of 27.2, hemoglobin 9.1, MCV 86, platelets 213,000, WBC 2900 with an ANC of 1500.  Differential was unremarkable.  Creatinine was 1.33 with a creatinine clearance of 46 ml/min.  Albumen was 2.3.  Ferritin was 808 (high).  Iron studies were unable to be done.  Normal studies included a B12, TSH, SPEP, free light chain ratio, and UPEP.  She has stage III chronic kidney disease.  She underwent amputation of the right great toe on 03/01/2015 for gangrene.  Plan: 1.  Review work-up and documented folate deficiency. 2.  Continue folic acid 1 mg a day. 3.  Labs today:  CBC with diff, retic, iron studies, ESR, epo level, Coombs.. 4.  Recollect 24 hour urine for free light  chains. 5.  RTC in 2 weeks for MD assessment.   Lequita Asal, MD  03/04/2015, 2:15 PM

## 2015-03-05 ENCOUNTER — Inpatient Hospital Stay: Payer: Medicare HMO | Attending: Hematology and Oncology

## 2015-03-05 DIAGNOSIS — N183 Chronic kidney disease, stage 3 unspecified: Secondary | ICD-10-CM

## 2015-03-05 DIAGNOSIS — R634 Abnormal weight loss: Secondary | ICD-10-CM

## 2015-03-05 DIAGNOSIS — Z79899 Other long term (current) drug therapy: Secondary | ICD-10-CM | POA: Diagnosis not present

## 2015-03-05 DIAGNOSIS — D631 Anemia in chronic kidney disease: Secondary | ICD-10-CM | POA: Insufficient documentation

## 2015-03-05 DIAGNOSIS — D539 Nutritional anemia, unspecified: Secondary | ICD-10-CM

## 2015-03-05 LAB — CBC WITH DIFFERENTIAL/PLATELET
Basophils Absolute: 0 10*3/uL (ref 0–0.1)
Basophils Relative: 0 %
Eosinophils Absolute: 0 10*3/uL (ref 0–0.7)
Eosinophils Relative: 0 %
HCT: 23.4 % — ABNORMAL LOW (ref 35.0–47.0)
Hemoglobin: 7.7 g/dL — ABNORMAL LOW (ref 12.0–16.0)
Lymphocytes Relative: 29 %
Lymphs Abs: 0.7 10*3/uL — ABNORMAL LOW (ref 1.0–3.6)
MCH: 28 pg (ref 26.0–34.0)
MCHC: 32.8 g/dL (ref 32.0–36.0)
MCV: 85.1 fL (ref 80.0–100.0)
Monocytes Absolute: 0.1 10*3/uL — ABNORMAL LOW (ref 0.2–0.9)
Monocytes Relative: 6 %
Neutro Abs: 1.6 10*3/uL (ref 1.4–6.5)
Neutrophils Relative %: 65 %
Platelets: 259 10*3/uL (ref 150–440)
RBC: 2.75 MIL/uL — ABNORMAL LOW (ref 3.80–5.20)
RDW: 16.3 % — ABNORMAL HIGH (ref 11.5–14.5)
WBC: 2.5 10*3/uL — ABNORMAL LOW (ref 3.6–11.0)

## 2015-03-05 LAB — ANAEROBIC CULTURE

## 2015-03-05 LAB — DAT, POLYSPECIFIC AHG (ARMC ONLY)
DAT, IgG: POSITIVE
DAT, complement: NEGATIVE
Polyspecific AHG test: POSITIVE

## 2015-03-05 LAB — IRON AND TIBC
Iron: 43 ug/dL (ref 28–170)
Saturation Ratios: UNDETERMINED % (ref 10.4–31.8)
TIBC: UNDETERMINED ug/dL (ref 250–450)
UIBC: UNDETERMINED ug/dL

## 2015-03-05 LAB — RETICULOCYTES
RBC.: 2.75 MIL/uL — ABNORMAL LOW (ref 3.80–5.20)
Retic Count, Absolute: 55 10*3/uL (ref 19.0–183.0)
Retic Ct Pct: 2 % (ref 0.4–3.1)

## 2015-03-05 LAB — SEDIMENTATION RATE: Sed Rate: 52 mm/hr — ABNORMAL HIGH (ref 0–30)

## 2015-03-05 IMAGING — CT CT HEAD WITHOUT CONTRAST
1 of 2 series · 16 of 30 positions shown, 20 images · non-contrast
Comparison: Prior study from 05/19/2013.

CLINICAL DATA: Initial evaluation for altered mental status,
confusion.

EXAM:
CT HEAD WITHOUT CONTRAST
TECHNIQUE: Contiguous axial images were obtained from the base of the skull
through the vertex without intravenous contrast.

[Series 2: head wo · axial · 0.39mm/px · z∈[+393,+519]mm · 16 of 32 slices shown, 20 images]
[im 2/32  brain]
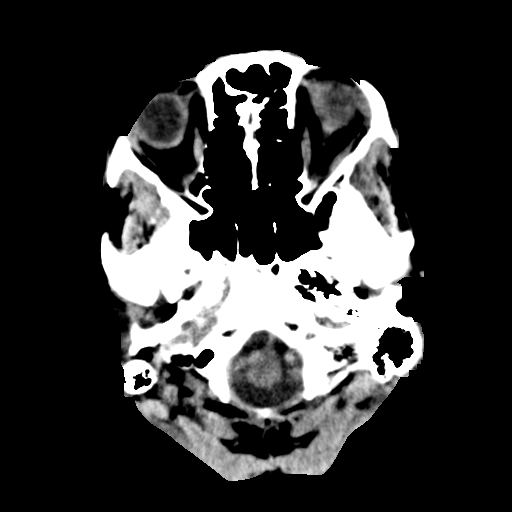
[im 2/32  bone]
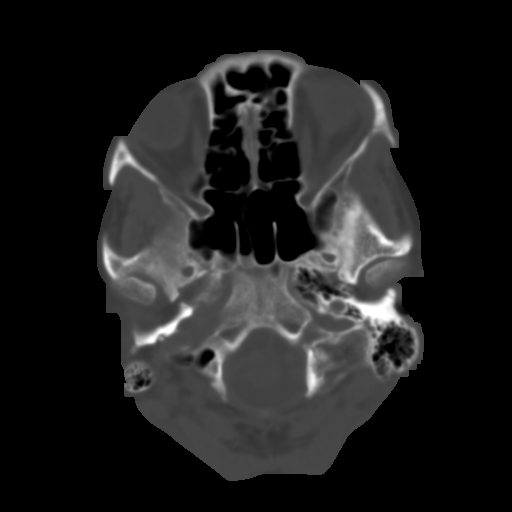
[im 5/32  brain]
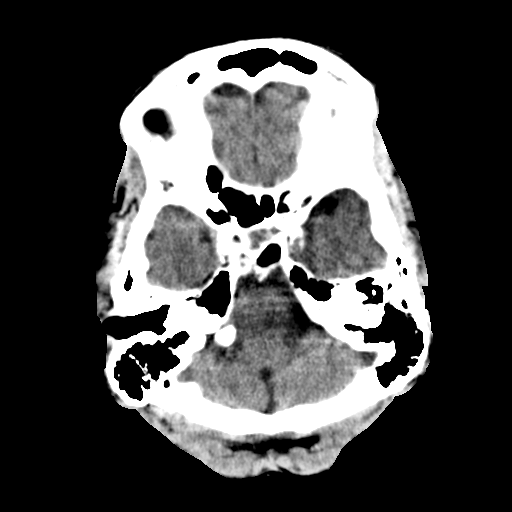
[im 6/32  brain]
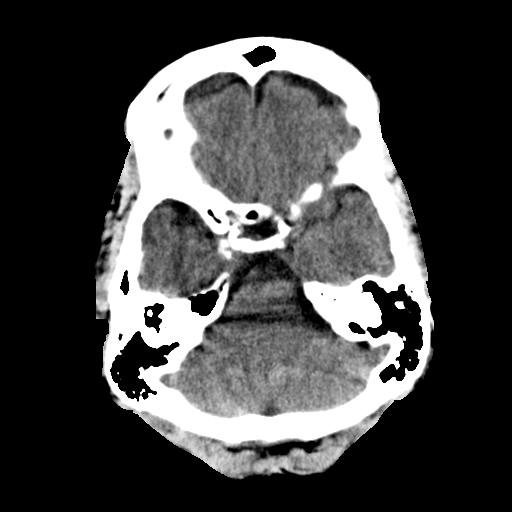
[im 7/32  brain]
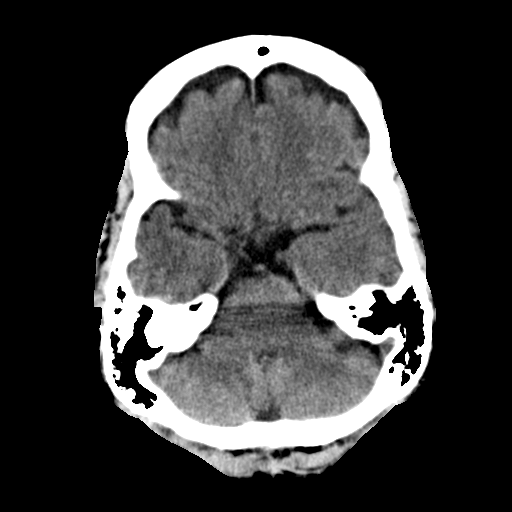
[im 10/32  brain]
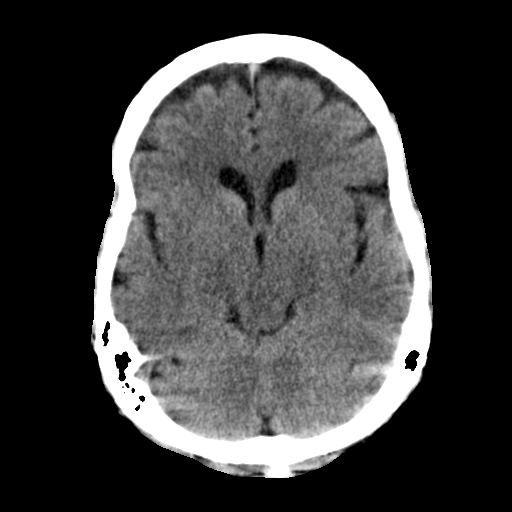
[im 10/32  bone]
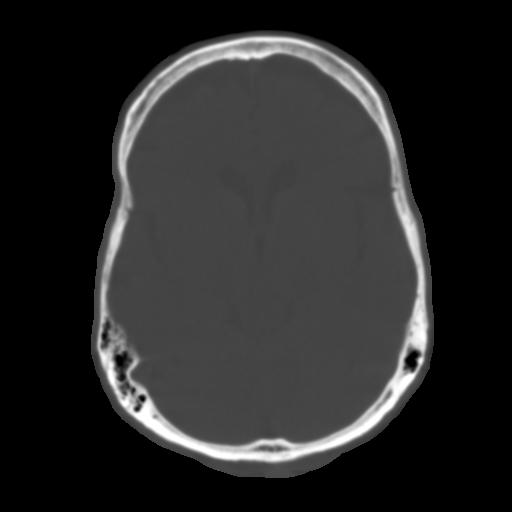
[im 11/32  brain]
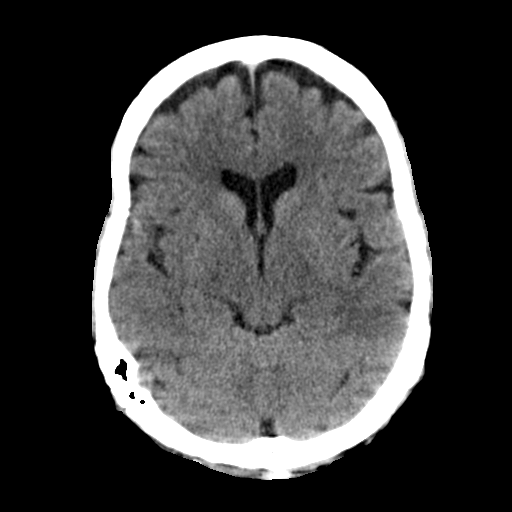
[im 13/32  brain]
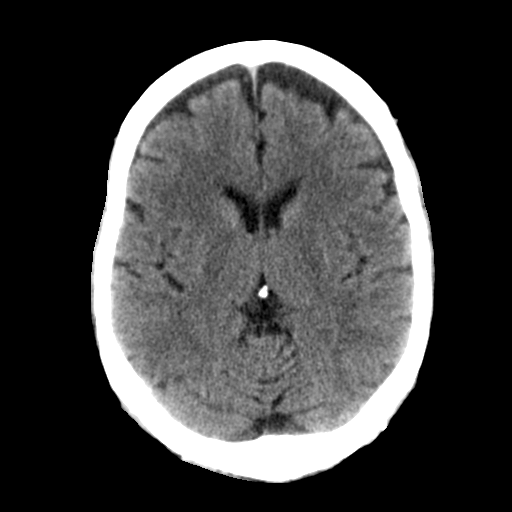
[im 15/32  brain]
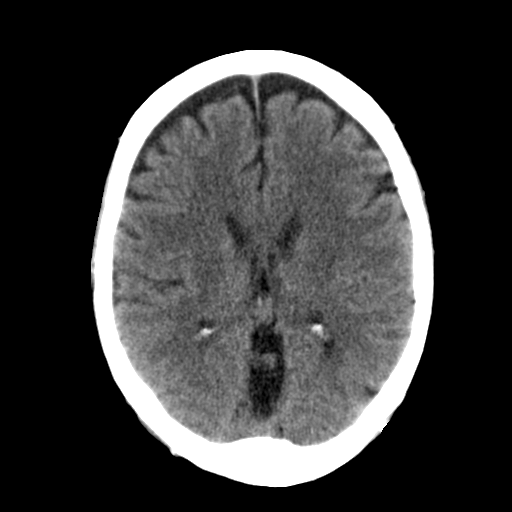
[im 17/32  brain]
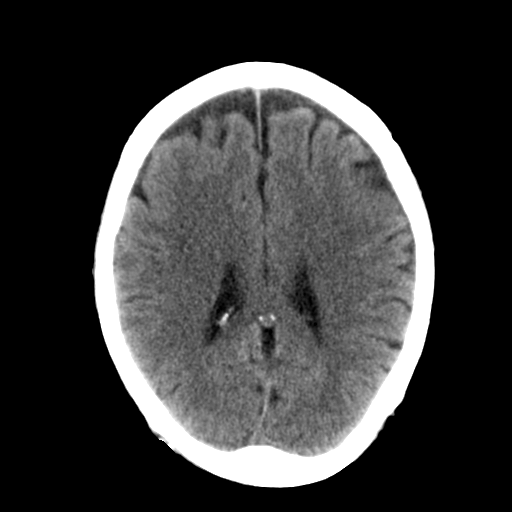
[im 17/32  bone]
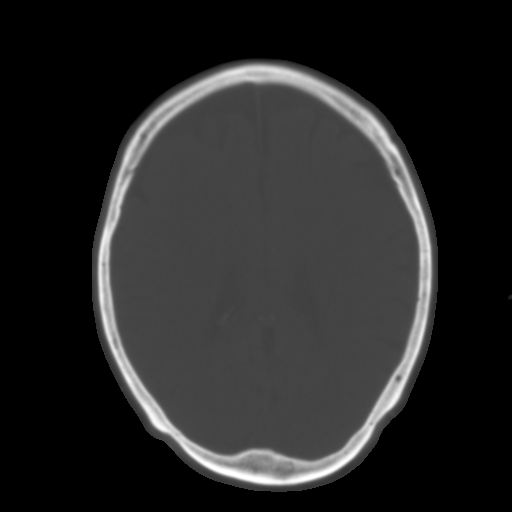
[im 19/32  brain]
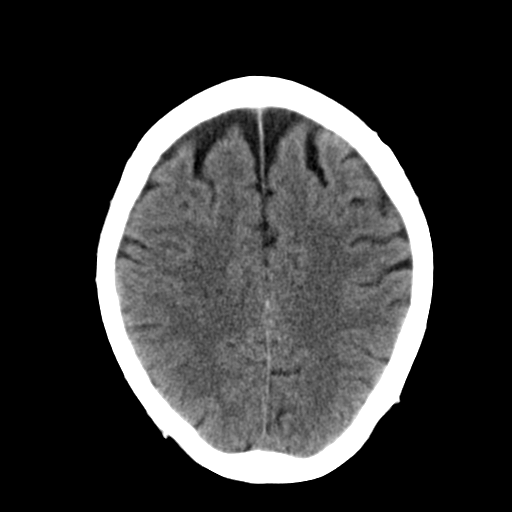
[im 21/32  brain]
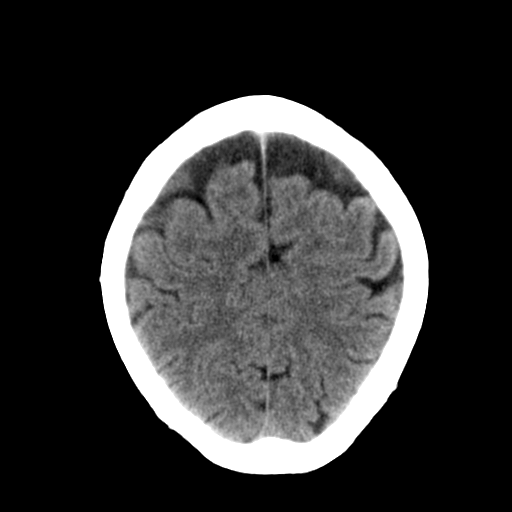
[im 22/32  brain]
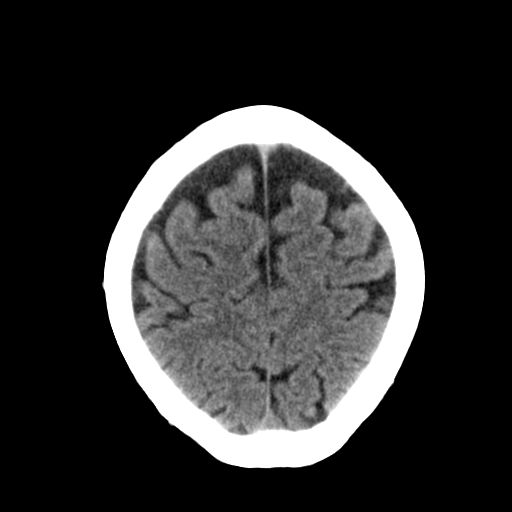
[im 25/32  brain]
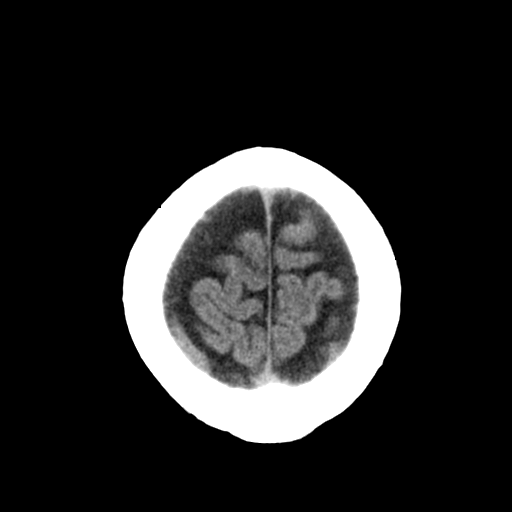
[im 25/32  bone]
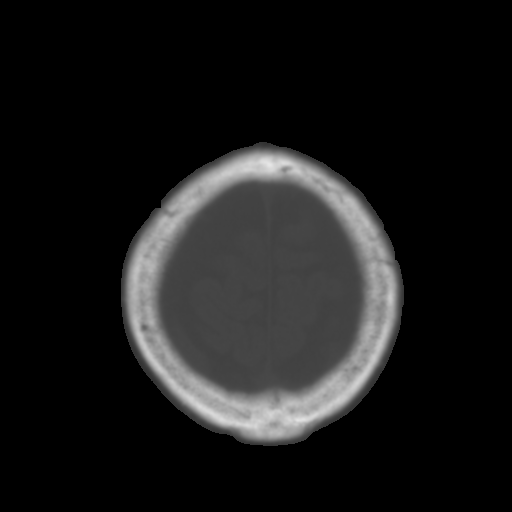
[im 26/32  brain]
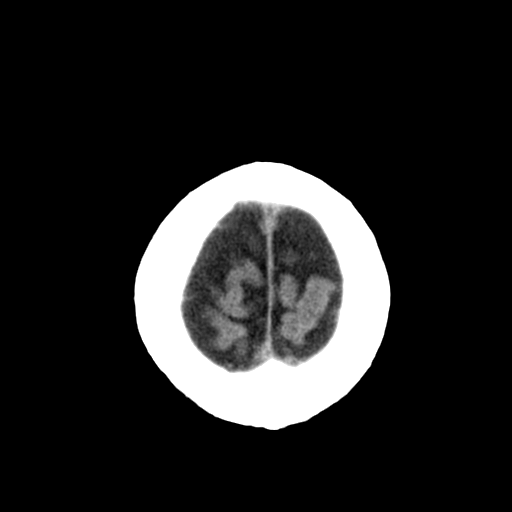
[im 27/32  brain]
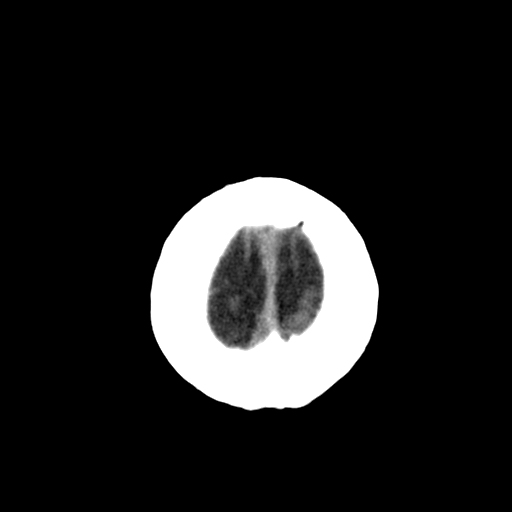
[im 30/32  brain]
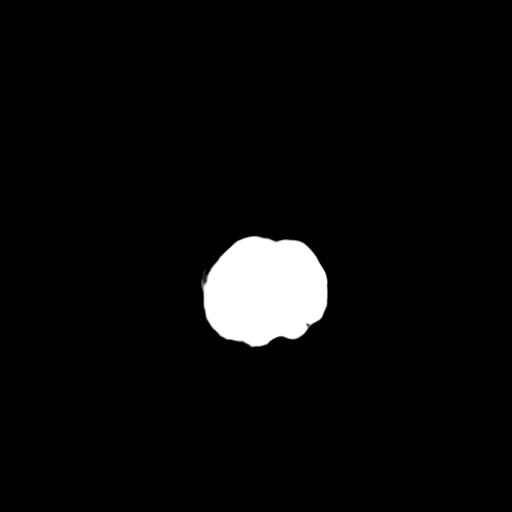

[16 of 30 positions shown; findings below may reference images not displayed]

FINDINGS: Diffuse prominence of the CSF containing spaces is compatible with
generalized age-related cerebral atrophy. Mild chronic small vessel
ischemic changes present.

No acute large vessel territory infarct. No intracranial hemorrhage.
No hydrocephalus. No extra-axial fluid collection.

Scalp soft tissues within normal limits. No acute abnormality seen
about the orbits.

Calvarium intact.

Minimal opacity noted within the right sphenoid sinus. Paranasal
sinuses are otherwise clear. No mastoid effusion.
IMPRESSION: 1. No acute intracranial process.
2. Age-related atrophy with mild chronic small vessel ischemic
disease.

## 2015-03-06 LAB — ERYTHROPOIETIN: Erythropoietin: 49.3 m[IU]/mL — ABNORMAL HIGH (ref 2.6–18.5)

## 2015-03-09 DIAGNOSIS — N183 Chronic kidney disease, stage 3 (moderate): Secondary | ICD-10-CM | POA: Diagnosis not present

## 2015-03-11 ENCOUNTER — Other Ambulatory Visit: Payer: Self-pay | Admitting: *Deleted

## 2015-03-11 DIAGNOSIS — R634 Abnormal weight loss: Secondary | ICD-10-CM

## 2015-03-11 DIAGNOSIS — D539 Nutritional anemia, unspecified: Secondary | ICD-10-CM

## 2015-03-12 LAB — FREE K+L LT CHAINS,QN,UR
Free Kappa/Lambda Ratio: 9.19 (ref 2.04–10.37)
Free Lambda Lt Chains,Ur: 40.7 mg/L — ABNORMAL HIGH (ref 0.24–6.66)
Free Lt Chn Excr Rate: 374 mg/L — ABNORMAL HIGH (ref 1.35–24.19)
Total Volume: 1050

## 2015-03-14 NOTE — Therapy (Signed)
[facility] [address] [place], [place], [address] Phone: [phone]   Fax:  [phone]  Patient Details  Name: [name] MRN: [id number] Date of Birth: 1945-08-30 Referring Provider:  No ref. provider found  Encounter Date: 03/14/2015  SPEECH THERAPY DISCHARGE SUMMARY  Visits from Start of Care: 4  Current functional level related to goals / functional outcomes: The patient made slow progress towards shifting her high-pitched voice into more WNL voicing when she stopped coming to visits.  In EP[facility], SLP saw pt needed to cease ST due to change in medical status 07-22-14. She never returned to [facility] following 07-19-14..   Remaining deficits: Unknown.   Education / Equipment: WNL voicing  Plan: Patient agrees to discharge.  Patient goals were partially met. Patient is being discharged due to a change in medical status.  ?????      [place] , MS, CCC-SLP 03/14/2015, 1:45 PM  [facility] [address] [place], [place], [address] Phone: [phone]   Fax:  [phone]

## 2015-03-24 ENCOUNTER — Encounter: Payer: Self-pay | Admitting: Hematology and Oncology

## 2015-03-26 ENCOUNTER — Other Ambulatory Visit: Payer: Self-pay | Admitting: Hematology and Oncology

## 2015-03-26 ENCOUNTER — Inpatient Hospital Stay: Payer: Medicare HMO | Admitting: Hematology and Oncology

## 2015-04-02 ENCOUNTER — Inpatient Hospital Stay
Admission: AD | Admit: 2015-04-02 | Discharge: 2015-04-08 | DRG: 617 | Disposition: A | Discharge status: Home Health | Payer: Medicare HMO | Source: Ambulatory Visit | Attending: Internal Medicine | Admitting: Internal Medicine

## 2015-04-02 DIAGNOSIS — Z8673 Personal history of transient ischemic attack (TIA), and cerebral infarction without residual deficits: Secondary | ICD-10-CM

## 2015-04-02 DIAGNOSIS — Z79899 Other long term (current) drug therapy: Secondary | ICD-10-CM

## 2015-04-02 DIAGNOSIS — E11319 Type 2 diabetes mellitus with unspecified diabetic retinopathy without macular edema: Secondary | ICD-10-CM | POA: Diagnosis present

## 2015-04-02 DIAGNOSIS — M7989 Other specified soft tissue disorders: Secondary | ICD-10-CM

## 2015-04-02 DIAGNOSIS — B9689 Other specified bacterial agents as the cause of diseases classified elsewhere: Secondary | ICD-10-CM | POA: Diagnosis present

## 2015-04-02 DIAGNOSIS — M869 Osteomyelitis, unspecified: Secondary | ICD-10-CM | POA: Diagnosis present

## 2015-04-02 DIAGNOSIS — M2040 Other hammer toe(s) (acquired), unspecified foot: Secondary | ICD-10-CM | POA: Diagnosis present

## 2015-04-02 DIAGNOSIS — Z8249 Family history of ischemic heart disease and other diseases of the circulatory system: Secondary | ICD-10-CM | POA: Diagnosis not present

## 2015-04-02 DIAGNOSIS — M86171 Other acute osteomyelitis, right ankle and foot: Secondary | ICD-10-CM | POA: Diagnosis present

## 2015-04-02 DIAGNOSIS — N179 Acute kidney failure, unspecified: Secondary | ICD-10-CM | POA: Diagnosis present

## 2015-04-02 DIAGNOSIS — D509 Iron deficiency anemia, unspecified: Secondary | ICD-10-CM | POA: Diagnosis present

## 2015-04-02 DIAGNOSIS — Z801 Family history of malignant neoplasm of trachea, bronchus and lung: Secondary | ICD-10-CM | POA: Diagnosis not present

## 2015-04-02 DIAGNOSIS — L89152 Pressure ulcer of sacral region, stage 2: Secondary | ICD-10-CM | POA: Diagnosis present

## 2015-04-02 DIAGNOSIS — I509 Heart failure, unspecified: Secondary | ICD-10-CM | POA: Diagnosis present

## 2015-04-02 DIAGNOSIS — E1169 Type 2 diabetes mellitus with other specified complication: Principal | ICD-10-CM | POA: Diagnosis present

## 2015-04-02 DIAGNOSIS — M84674A Pathological fracture in other disease, right foot, initial encounter for fracture: Secondary | ICD-10-CM | POA: Diagnosis present

## 2015-04-02 DIAGNOSIS — Z792 Long term (current) use of antibiotics: Secondary | ICD-10-CM | POA: Diagnosis not present

## 2015-04-02 DIAGNOSIS — E11621 Type 2 diabetes mellitus with foot ulcer: Secondary | ICD-10-CM | POA: Diagnosis present

## 2015-04-02 DIAGNOSIS — D638 Anemia in other chronic diseases classified elsewhere: Secondary | ICD-10-CM | POA: Diagnosis present

## 2015-04-02 DIAGNOSIS — I739 Peripheral vascular disease, unspecified: Secondary | ICD-10-CM | POA: Diagnosis present

## 2015-04-02 DIAGNOSIS — E1165 Type 2 diabetes mellitus with hyperglycemia: Secondary | ICD-10-CM | POA: Diagnosis present

## 2015-04-02 DIAGNOSIS — K219 Gastro-esophageal reflux disease without esophagitis: Secondary | ICD-10-CM | POA: Diagnosis present

## 2015-04-02 DIAGNOSIS — I130 Hypertensive heart and chronic kidney disease with heart failure and stage 1 through stage 4 chronic kidney disease, or unspecified chronic kidney disease: Secondary | ICD-10-CM | POA: Diagnosis present

## 2015-04-02 DIAGNOSIS — E785 Hyperlipidemia, unspecified: Secondary | ICD-10-CM | POA: Diagnosis present

## 2015-04-02 DIAGNOSIS — H409 Unspecified glaucoma: Secondary | ICD-10-CM | POA: Diagnosis present

## 2015-04-02 DIAGNOSIS — L97519 Non-pressure chronic ulcer of other part of right foot with unspecified severity: Secondary | ICD-10-CM | POA: Diagnosis present

## 2015-04-02 DIAGNOSIS — N184 Chronic kidney disease, stage 4 (severe): Secondary | ICD-10-CM | POA: Diagnosis present

## 2015-04-02 DIAGNOSIS — E1122 Type 2 diabetes mellitus with diabetic chronic kidney disease: Secondary | ICD-10-CM | POA: Diagnosis present

## 2015-04-02 DIAGNOSIS — I70201 Unspecified atherosclerosis of native arteries of extremities, right leg: Secondary | ICD-10-CM | POA: Diagnosis present

## 2015-04-02 DIAGNOSIS — Z833 Family history of diabetes mellitus: Secondary | ICD-10-CM | POA: Diagnosis not present

## 2015-04-02 DIAGNOSIS — E1140 Type 2 diabetes mellitus with diabetic neuropathy, unspecified: Secondary | ICD-10-CM | POA: Diagnosis present

## 2015-04-02 DIAGNOSIS — Z95828 Presence of other vascular implants and grafts: Secondary | ICD-10-CM

## 2015-04-02 DIAGNOSIS — L8990 Pressure ulcer of unspecified site, unspecified stage: Secondary | ICD-10-CM | POA: Diagnosis present

## 2015-04-02 DIAGNOSIS — Z7984 Long term (current) use of oral hypoglycemic drugs: Secondary | ICD-10-CM

## 2015-04-02 DIAGNOSIS — I2510 Atherosclerotic heart disease of native coronary artery without angina pectoris: Secondary | ICD-10-CM | POA: Diagnosis present

## 2015-04-02 LAB — COMPREHENSIVE METABOLIC PANEL
ALBUMIN: 1.7 g/dL — AB (ref 3.5–5.0)
ALK PHOS: 68 U/L (ref 38–126)
ALT: 9 U/L — ABNORMAL LOW (ref 14–54)
AST: 24 U/L (ref 15–41)
Anion gap: 4 — ABNORMAL LOW (ref 5–15)
BUN: 33 mg/dL — AB (ref 6–20)
CALCIUM: 7.4 mg/dL — AB (ref 8.9–10.3)
CO2: 20 mmol/L — ABNORMAL LOW (ref 22–32)
Chloride: 114 mmol/L — ABNORMAL HIGH (ref 101–111)
Creatinine, Ser: 1.25 mg/dL — ABNORMAL HIGH (ref 0.44–1.00)
GFR calc Af Amer: 49 mL/min — ABNORMAL LOW (ref >60)
GFR, EST NON AFRICAN AMERICAN: 43 mL/min — AB (ref >60)
GLUCOSE: 137 mg/dL — AB (ref 65–99)
Potassium: 4.1 mmol/L (ref 3.5–5.1)
Sodium: 138 mmol/L (ref 135–145)
TOTAL PROTEIN: 5.1 g/dL — AB (ref 6.5–8.1)

## 2015-04-02 LAB — CBC WITH DIFFERENTIAL/PLATELET
BASOS ABS: 0 10*3/uL (ref 0–0.1)
BASOS PCT: 0 %
EOS ABS: 0.2 10*3/uL (ref 0–0.7)
EOS PCT: 7 %
HCT: 21.4 % — ABNORMAL LOW (ref 35.0–47.0)
Hemoglobin: 6.9 g/dL — ABNORMAL LOW (ref 12.0–16.0)
Lymphocytes Relative: 25 %
Lymphs Abs: 0.7 10*3/uL — ABNORMAL LOW (ref 1.0–3.6)
MCH: 27.1 pg (ref 26.0–34.0)
MCHC: 32.2 g/dL (ref 32.0–36.0)
MCV: 84.2 fL (ref 80.0–100.0)
MONO ABS: 0.1 10*3/uL — AB (ref 0.2–0.9)
Monocytes Relative: 4 %
Neutro Abs: 1.8 10*3/uL (ref 1.4–6.5)
Neutrophils Relative %: 64 %
PLATELETS: 259 10*3/uL (ref 150–440)
RBC: 2.54 MIL/uL — ABNORMAL LOW (ref 3.80–5.20)
RDW: 16.2 % — AB (ref 11.5–14.5)
WBC: 2.8 10*3/uL — ABNORMAL LOW (ref 3.6–11.0)

## 2015-04-02 LAB — PROTIME-INR
INR: 1.33
PROTHROMBIN TIME: 16.6 s — AB (ref 11.4–15.0)

## 2015-04-02 LAB — HEMOGLOBIN AND HEMATOCRIT, BLOOD
HEMATOCRIT: 22.2 % — AB (ref 35.0–47.0)
Hemoglobin: 7.4 g/dL — ABNORMAL LOW (ref 12.0–16.0)

## 2015-04-02 LAB — SURGICAL PCR SCREEN
MRSA, PCR: NEGATIVE
Staphylococcus aureus: NEGATIVE

## 2015-04-02 LAB — RAPID HIV SCREEN (HIV 1/2 AB+AG)
HIV 1/2 ANTIBODIES: NONREACTIVE
HIV-1 P24 ANTIGEN - HIV24: NONREACTIVE

## 2015-04-02 LAB — APTT: APTT: 34 s (ref 24–36)

## 2015-04-02 LAB — PREPARE RBC (CROSSMATCH)

## 2015-04-02 MED ORDER — POLYETHYLENE GLYCOL 3350 17 G PO PACK
17.0000 g | PACK | Freq: Every day | ORAL | Status: DC | PRN
  Administered 2015-04-08: 17 g via ORAL
  Filled 2015-04-02: qty 1

## 2015-04-02 MED ORDER — SODIUM CHLORIDE 0.9 % IJ SOLN
3.0000 mL | Freq: Two times a day (BID) | INTRAMUSCULAR | Status: DC
  Administered 2015-04-02 – 2015-04-07 (×5): 3 mL via INTRAVENOUS

## 2015-04-02 MED ORDER — FUROSEMIDE 40 MG PO TABS
40.0000 mg | ORAL_TABLET | Freq: Two times a day (BID) | ORAL | Status: DC
  Administered 2015-04-02 – 2015-04-07 (×7): 40 mg via ORAL
  Filled 2015-04-02 (×9): qty 1

## 2015-04-02 MED ORDER — CLONIDINE HCL 0.1 MG PO TABS
0.2000 mg | ORAL_TABLET | ORAL | Status: AC
  Administered 2015-04-02: 0.2 mg via ORAL
  Filled 2015-04-02: qty 2

## 2015-04-02 MED ORDER — ACETAMINOPHEN 325 MG PO TABS: 650.0000 mg | ORAL_TABLET | Freq: Four times a day (QID) | ORAL | Status: DC | PRN

## 2015-04-02 MED ORDER — SODIUM CHLORIDE 0.9 % IV SOLN
Freq: Once | INTRAVENOUS | Status: AC
  Administered 2015-04-02: 17:00:00 via INTRAVENOUS

## 2015-04-02 MED ORDER — FERROUS SULFATE 325 (65 FE) MG PO TABS
325.0000 mg | ORAL_TABLET | Freq: Every day | ORAL | Status: DC
  Administered 2015-04-04 – 2015-04-08 (×5): 325 mg via ORAL
  Filled 2015-04-02 (×6): qty 1

## 2015-04-02 MED ORDER — PROPRANOLOL HCL 20 MG PO TABS
40.0000 mg | ORAL_TABLET | Freq: Three times a day (TID) | ORAL | Status: DC
Start: 2015-04-02 — End: 2015-04-08
  Administered 2015-04-02 – 2015-04-08 (×18): 40 mg via ORAL
  Filled 2015-04-02 (×21): qty 2

## 2015-04-02 MED ORDER — HYDROCODONE-ACETAMINOPHEN 5-325 MG PO TABS
1.0000 | ORAL_TABLET | ORAL | Status: DC | PRN
  Administered 2015-04-03 – 2015-04-05 (×8): 1 via ORAL
  Administered 2015-04-06: 2 via ORAL
  Administered 2015-04-06 (×2): 1 via ORAL
  Administered 2015-04-07: 2 via ORAL
  Administered 2015-04-08: 1 via ORAL
  Filled 2015-04-02 (×5): qty 1
  Filled 2015-04-02 (×2): qty 2
  Filled 2015-04-02 (×2): qty 1
  Filled 2015-04-02: qty 2
  Filled 2015-04-02 (×2): qty 1

## 2015-04-02 MED ORDER — ONDANSETRON HCL 4 MG PO TABS: 4.0000 mg | ORAL_TABLET | Freq: Four times a day (QID) | ORAL | Status: DC | PRN

## 2015-04-02 MED ORDER — SODIUM CHLORIDE 0.9 % IV SOLN
250.0000 mL | INTRAVENOUS | Status: DC | PRN
  Administered 2015-04-03 (×3): via INTRAVENOUS

## 2015-04-02 MED ORDER — VANCOMYCIN HCL IN DEXTROSE 1-5 GM/200ML-% IV SOLN
1000.0000 mg | INTRAVENOUS | Status: DC
  Administered 2015-04-03: 1000 mg via INTRAVENOUS
  Filled 2015-04-02 (×2): qty 200

## 2015-04-02 MED ORDER — ONDANSETRON HCL 4 MG/2ML IJ SOLN: 4.0000 mg | Freq: Four times a day (QID) | INTRAMUSCULAR | Status: DC | PRN

## 2015-04-02 MED ORDER — PIPERACILLIN-TAZOBACTAM 3.375 G IVPB
3.3750 g | Freq: Once | INTRAVENOUS | Status: DC
  Filled 2015-04-02: qty 50

## 2015-04-02 MED ORDER — DIPHENHYDRAMINE HCL 25 MG PO CAPS
25.0000 mg | ORAL_CAPSULE | Freq: Once | ORAL | Status: AC
  Administered 2015-04-02: 25 mg via ORAL
  Filled 2015-04-02: qty 1

## 2015-04-02 MED ORDER — RAMIPRIL 5 MG PO CAPS
5.0000 mg | ORAL_CAPSULE | Freq: Every day | ORAL | Status: DC
  Administered 2015-04-02: 5 mg via ORAL
  Filled 2015-04-02 (×2): qty 1

## 2015-04-02 MED ORDER — HYDRALAZINE HCL 50 MG PO TABS
100.0000 mg | ORAL_TABLET | Freq: Three times a day (TID) | ORAL | Status: DC
  Administered 2015-04-02 – 2015-04-08 (×17): 100 mg via ORAL
  Filled 2015-04-02 (×18): qty 2

## 2015-04-02 MED ORDER — SODIUM CHLORIDE 0.9 % IJ SOLN: 3.0000 mL | INTRAMUSCULAR | Status: DC | PRN

## 2015-04-02 MED ORDER — PIPERACILLIN-TAZOBACTAM 3.375 G IVPB
3.3750 g | Freq: Three times a day (TID) | INTRAVENOUS | Status: DC
  Administered 2015-04-02 – 2015-04-04 (×5): 3.375 g via INTRAVENOUS
  Filled 2015-04-02 (×7): qty 50

## 2015-04-02 MED ORDER — GLIMEPIRIDE 2 MG PO TABS
1.0000 mg | ORAL_TABLET | Freq: Every day | ORAL | Status: DC
  Administered 2015-04-04 – 2015-04-07 (×4): 1 mg via ORAL
  Filled 2015-04-02 (×2): qty 1
  Filled 2015-04-02 (×2): qty 2
  Filled 2015-04-02: qty 1

## 2015-04-02 MED ORDER — FOLIC ACID 1 MG PO TABS
1.0000 mg | ORAL_TABLET | Freq: Every day | ORAL | Status: DC
Start: 2015-04-03 — End: 2015-04-08
  Administered 2015-04-04 – 2015-04-08 (×5): 1 mg via ORAL
  Filled 2015-04-02 (×7): qty 1

## 2015-04-02 MED ORDER — SIMVASTATIN 20 MG PO TABS
20.0000 mg | ORAL_TABLET | Freq: Every day | ORAL | Status: DC
  Administered 2015-04-02 – 2015-04-07 (×6): 20 mg via ORAL
  Filled 2015-04-02 (×6): qty 1

## 2015-04-02 MED ORDER — ACETAMINOPHEN 325 MG PO TABS
650.0000 mg | ORAL_TABLET | Freq: Once | ORAL | Status: AC
  Administered 2015-04-02: 650 mg via ORAL
  Filled 2015-04-02: qty 2

## 2015-04-02 MED ORDER — FUROSEMIDE 10 MG/ML IJ SOLN
20.0000 mg | Freq: Once | INTRAMUSCULAR | Status: AC
  Administered 2015-04-02: 20 mg via INTRAVENOUS
  Filled 2015-04-02: qty 2

## 2015-04-02 MED ORDER — ACETAMINOPHEN 650 MG RE SUPP: 650.0000 mg | Freq: Four times a day (QID) | RECTAL | Status: DC | PRN

## 2015-04-02 MED ORDER — ALBUTEROL SULFATE (2.5 MG/3ML) 0.083% IN NEBU: 2.5000 mg | INHALATION_SOLUTION | RESPIRATORY_TRACT | Status: DC | PRN

## 2015-04-02 MED ORDER — VANCOMYCIN HCL 10 G IV SOLR
1500.0000 mg | Freq: Once | INTRAVENOUS | Status: AC
  Administered 2015-04-02: 1500 mg via INTRAVENOUS
  Filled 2015-04-02: qty 1500

## 2015-04-02 MED ORDER — ISOSORBIDE DINITRATE 20 MG PO TABS
30.0000 mg | ORAL_TABLET | Freq: Two times a day (BID) | ORAL | Status: DC
Start: 2015-04-02 — End: 2015-04-08
  Administered 2015-04-02 – 2015-04-08 (×10): 30 mg via ORAL
  Filled 2015-04-02: qty 2
  Filled 2015-04-02: qty 1
  Filled 2015-04-02: qty 2
  Filled 2015-04-02: qty 1.5
  Filled 2015-04-02 (×3): qty 2
  Filled 2015-04-02: qty 1
  Filled 2015-04-02 (×2): qty 2

## 2015-04-02 MED ORDER — BISACODYL 10 MG RE SUPP: 10.0000 mg | Freq: Every day | RECTAL | Status: DC | PRN

## 2015-04-02 MED ORDER — DOCUSATE SODIUM 100 MG PO CAPS
100.0000 mg | ORAL_CAPSULE | Freq: Two times a day (BID) | ORAL | Status: DC
  Filled 2015-04-02: qty 1

## 2015-04-02 MED ORDER — PANTOPRAZOLE SODIUM 40 MG PO TBEC
40.0000 mg | DELAYED_RELEASE_TABLET | Freq: Every day | ORAL | Status: DC
  Administered 2015-04-04 – 2015-04-08 (×5): 40 mg via ORAL
  Filled 2015-04-02 (×6): qty 1

## 2015-04-02 MED ORDER — ENOXAPARIN SODIUM 30 MG/0.3ML ~~LOC~~ SOLN: 30.0000 mg | SUBCUTANEOUS | Status: DC

## 2015-04-02 MED ORDER — NITROGLYCERIN 0.4 MG SL SUBL: 0.4000 mg | SUBLINGUAL_TABLET | SUBLINGUAL | Status: DC | PRN

## 2015-04-02 MED ORDER — DOCUSATE SODIUM 100 MG PO CAPS
100.0000 mg | ORAL_CAPSULE | Freq: Two times a day (BID) | ORAL | Status: DC
  Administered 2015-04-02 – 2015-04-04 (×3): 100 mg via ORAL
  Filled 2015-04-02 (×3): qty 1

## 2015-04-02 MED ORDER — POTASSIUM CHLORIDE CRYS ER 20 MEQ PO TBCR
20.0000 meq | EXTENDED_RELEASE_TABLET | Freq: Every day | ORAL | Status: DC
  Administered 2015-04-02 – 2015-04-08 (×6): 20 meq via ORAL
  Filled 2015-04-02 (×7): qty 1

## 2015-04-02 NOTE — Consult Note (Signed)
[facility] Infectious Disease     Reason for Consult:Foot ulcer    Referring Physician: osteomyelitis Date of Admission:  04/02/2015   Active Problems:   Diabetic osteomyelitis ([place])   Osteomyelitis of ankle or foot ([place])   HPI: [name] is a 69 y.o. female with hx of recurrent ulceration of R foot who has been following with podiatry admitted with a new ulcer and findings of possible osteomyelitis of the 1st MT head.  She was being followed after amputation of great toe done 10/28.  That site was healing but she developed a wound over medial MT head.  There is some draiange from both wound sites. Denies fevers or chills. Has been on augmentin  Prior cxs of her toe which has since been debrided have grown multiple organisms including Stentotrophomonas, ESBL E coli, Providencie, morganell, enterococcus.     Past Medical History  Diagnosis Date  . Hypertension   . Diabetes mellitus without complication ([place])   . Anemia   . Coronary artery disease   . CHF (congestive heart failure) ([place])   . Glaucoma   . Hyperlipidemia   . Chronic kidney disease   . Neuropathy ([place])   . Chronic anemia   . Stroke ([place])   . Anginal pain ([place])   . Arthritis   . Retinopathy   . Cardiomyopathy ([place])   . Carotid artery stenosis   . Hoarseness, chronic   . GERD (gastroesophageal reflux disease)   . Hemangioma of liver   . Weight loss, unintentional    Past Surgical History  Procedure Laterality Date  . Cardiac catheterization    . Esophagogastroduodenoscopy (egd) with propofol N/A 09/24/2014    Elliott-gastritis & normal Billroth II changes   . Colonoscopy with propofol N/A 09/24/2014    Elliott-Incomplete secondary to prep  . Coronary angioplasty  2016    stent placed  . Bilroth ii procedure    . Colonoscopy with propofol N/A 11/12/2014    Procedure: COLONOSCOPY WITH PROPOFOL;  Surgeon: Manya Silvas, MD;  Location: [facility] ENDOSCOPY;  Service: Endoscopy;  Laterality: N/A;  Trudee Kuster  hole Right 12/06/2014    Procedure: Right burr hole  for subdural hematoma;  Surgeon: Newman Pies, MD;  Location: [facility] NEURO ORS;  Service: Neurosurgery;  Laterality: Right;  . Irrigation and debridement foot Right 02/01/2015    Procedure: IRRIGATION AND DEBRIDEMENT FOOT/RIGHT GREAT TOE;  Surgeon: [name], DPM;  Location: [facility] ORS;  Service: Podiatry;  Laterality: Right;  . Eye surgery Right     cataract removed  . Amputation toe Right 03/01/2015    Procedure: AMPUTATION  RIGHT GREAT TOE;  Surgeon: [name], DPM;  Location: [facility] ORS;  Service: Podiatry;  Laterality: Right;   Social History  Substance Use Topics  . Smoking status: Never Smoker   . Smokeless tobacco: Never Used     Comment: quit many years ago  . Alcohol Use: No   Family History  Problem Relation Age of Onset  . Diabetes Mother   . Heart disease Father   . Diabetes Sister   . Lung cancer Brother   . Cancer Brother   . Diabetes Brother   . Diabetes Sister     Allergies:  Allergies  Allergen Reactions  . Codeine Other (See Comments)    Reaction:  Hallucinations    Current antibiotics: Antibiotics Given (last 72 hours)    None      MEDICATIONS: . docusate sodium  100 mg Oral BID  .  enoxaparin (LOVENOX) injection  30 mg Subcutaneous Q24H  . piperacillin-tazobactam (ZOSYN)  IV  3.375 g Intravenous Once  . sodium chloride  3 mL Intravenous Q12H  . vancomycin  1,500 mg Intravenous Once    Review of Systems - 11 systems reviewed and negative per HPI   OBJECTIVE: Temp:  [98.9 F (37.2 C)] 98.9 F (37.2 C) (11/29 1228) Pulse Rate:  [84] 84 (11/29 1228) Resp:  [16] 16 (11/29 1228) BP: (184-194)/(63-76) 184/63 mmHg (11/29 1228) SpO2:  [100 %] 100 % (11/29 1228) Weight:  [67.405 kg (148 lb 9.6 oz)] 67.405 kg (148 lb 9.6 oz) (11/29 1228) Physical Exam  Constitutional:  oriented to person, place, and time. appears well-developed and well-nourished. No distress.  HENT: [place]/AT, PERRLA, no scleral  icterus Mouth/Throat: Oropharynx is clear and moist. No oropharyngeal exudate.  Cardiovascular: Normal rate, regular rhythm and normal heart sounds. Pulmonary/Chest: Effort normal and breath sounds normal. No respiratory distress.  has no wheezes.  Abdominal: Soft. Bowel sounds are normal.  exhibits no distension. There is no tenderness.  Lymphadenopathy: no cervical adenopathy. No axillary adenopathy Neurological: alert and oriented to person, place, and time.  Skin: R foot with great toe amputation site well appox with suture sin place but able to express ss drainge- mod amt R medial over MT head has a necrotic appearing ulcer with ss drainage  LABS: No results found for this or any previous visit (from the past 48 hour(s)). No components found for: ESR, C REACTIVE PROTEIN MICRO: 03/01/15    29moago    Specimen Description TOE   Special Requests NONE   Gram Stain FEW WBC SEEN  FEW YEAST  FEW GRAM NEGATIVE RODS  RARE GRAM POSITIVE RODS       Culture MODERATE GROWTH STENOTROPHOMONAS [place]  Report Status 03/04/2015 FINAL   Organism ID, Bacteria STENOTROPHOMONAS MALTOPHILIA   Resulting Agency [facility]    Culture & Susceptibility      STENOTROPHOMONAS MALTOPHILIA     Antibiotic Sensitivity Microscan Status    LEVOFLOXACIN Sensitive 0.5 SENSITIVE Final    Method: MIC    TRIMETH/SULFA Sensitive <=20 SENSITIVE Final    Method: MIC          Culture HEAVY GROWTH ESCHERICHIA COLI  HEAVY GROWTH PROVIDENCIA STUARTII  MODERATE GROWTH MORGANELLA MORGANII  MODERATE GROWTH ENTEROCOCCUS FAECALIS  Susceptibility Pattern Suggests Possibility of an Extended Spectrum Beta Lactamase Producer. Contact Laboratory Within 7 Days if Confirmation Warranted.       Report Status PENDING   Organism ID, Bacteria ESCHERICHIA COLI   Organism ID, Bacteria PROVIDENCIA STUARTII   Organism ID, Bacteria MORGANELLA MORGANII   Organism ID, Bacteria ENTEROCOCCUS FAECALIS    Resulting Agency [facility]    Culture & Susceptibility      PROVIDENCIA STUARTII     Antibiotic Sensitivity Microscan Status    AMPICILLIN Resistant >=32 RESISTANT Final    Method: MIC    CEFAZOLIN Resistant >=64 RESISTANT Final    Method: MIC    CEFTAZIDIME Sensitive <=1 SENSITIVE Final    Method: MIC    CEFTRIAXONE Sensitive <=1 SENSITIVE Final    Method: MIC    CIPROFLOXACIN Resistant >=4 RESISTANT Final    Method: MIC    GENTAMICIN Resistant 2 RESISTANT Final    Method: MIC    IMIPENEM Sensitive 2 SENSITIVE Final    Method: MIC    TRIMETH/SULFA Resistant >=320 RESISTANT Final    Method: MIC    Comments PROVIDENCIA STUARTII (MIC)    HEAVY  GROWTH PROVIDENCIA STUARTII             ESCHERICHIA COLI     Antibiotic Sensitivity Microscan Status    AMPICILLIN Resistant >=32 RESISTANT Final    Method: MIC    CEFAZOLIN Resistant >=64 RESISTANT Final    Method: MIC    CEFTAZIDIME Resistant 16 RESISTANT Final    Method: MIC    CEFTRIAXONE Resistant >=64 RESISTANT Final    Method: MIC    CIPROFLOXACIN Resistant >=4 RESISTANT Final    Method: MIC    Extended ESBL Resistant POSITIVE Final    Method: MIC    GENTAMICIN Sensitive <=1 SENSITIVE Final    Method: MIC    IMIPENEM Sensitive <=0.25 SENSITIVE Final    Method: MIC    TRIMETH/SULFA Resistant >=320 RESISTANT Final    Method: MIC    Comments ESCHERICHIA COLI (MIC)    HEAVY GROWTH ESCHERICHIA COLI             MORGANELLA MORGANII     Antibiotic Sensitivity Microscan Status    AMPICILLIN Resistant RESISTANT Final    Method: MIC    CEFAZOLIN Resistant RESISTANT Final    Method: MIC    CEFEPIME Sensitive SENSITIVE Final    Method: MIC    CEFTAZIDIME Resistant RESISTANT Final    Method: MIC    CEFTRIAXONE Sensitive SENSITIVE Final    Method: MIC    CIPROFLOXACIN Resistant RESISTANT Final     Method: MIC    GENTAMICIN Resistant RESISTANT Final    Method: MIC    IMIPENEM Sensitive SENSITIVE Final    Method: MIC    TRIMETH/SULFA Resistant RESISTANT Final    Method: MIC    Comments MORGANELLA MORGANII (MIC)    MODERATE GROWTH MORGANELLA MORGANII             ENTEROCOCCUS FAECALIS     Antibiotic Sensitivity Microscan Status    AMPICILLIN Sensitive SENSITIVE Final    Method: MIC    LINEZOLID Sensitive SENSITIVE Final    Method: MIC    Comments ENTEROCOCCUS FAECALIS (MIC)    MODERATE GROWTH ENTEROCOCCUS FAECALIS               IMAGING:  X-ray: 04/02/15 Right foot x-rays are questionable for osteomyelitis of the 1st metatarsal head. No obvious erosive changes but there is concern especially on the oblique view. She does have noted diffuse osteopenia.  Assessment:   [name] is a 69 y.o. female with recent amputation of R great toe 10/28 now with new medial ulcer and possible underlying osteomyelitis. She has abundant thin ss drainage from the site of amputation and the medial wound.  Labs are pending. Prior cxs reviewed.  Recommendations I have cultured the drainage from the medial wound Cont vanco and zosyn. Check ESR CRP Will need further wu and surgical management per podiatry. Thank you very much for allowing me to participate in the care of this patient. Please call with questions.   Cheral Marker. Ola Spurr, MD

## 2015-04-02 NOTE — Consult Note (Signed)
Reason for Consult: Nonhealing surgical wound right foot with recent development of osteomyelitis in the first metatarsal Referring Physician: [facility]  [name] is an 69 y.o. female.  HPI: The patient has a couple month history of ulceration and infection in her right great toe. Previously has had a debridement of the area which was subsequently followed later by an amputation of the right great toe. This was performed by Dr. Vickki Muff. Continued to have nonhealing of the wound and the incision was cleaned out and then resutured primarily. Recently over the last couple of weeks has developed a new ulceration on the side of her first metatarsal. It has deepened down to the level of exposed bone where some cartilaginous fragments were taken out in the office today. Serial x-rays revealed destruction of the first metatarsal head consistent with osteomyelitis and the patient was admitted for IV antibiotics and re-debridement.  Past Medical History  Diagnosis Date  . Hypertension   . Diabetes mellitus without complication ([place])   . Anemia   . Coronary artery disease   . CHF (congestive heart failure) ([place])   . Glaucoma   . Hyperlipidemia   . Chronic kidney disease   . Neuropathy ([place])   . Chronic anemia   . Stroke ([place])   . Anginal pain ([place])   . Arthritis   . Retinopathy   . Cardiomyopathy ([place])   . Carotid artery stenosis   . Hoarseness, chronic   . GERD (gastroesophageal reflux disease)   . Hemangioma of liver   . Weight loss, unintentional     Past Surgical History  Procedure Laterality Date  . Cardiac catheterization    . Esophagogastroduodenoscopy (egd) with propofol N/A 09/24/2014    Elliott-gastritis & normal Billroth II changes   . Colonoscopy with propofol N/A 09/24/2014    Elliott-Incomplete secondary to prep  . Coronary angioplasty  2016    stent placed  . Bilroth ii procedure    . Colonoscopy with propofol N/A 11/12/2014    Procedure: COLONOSCOPY  WITH PROPOFOL;  Surgeon: Manya Silvas, MD;  Location: [facility] ENDOSCOPY;  Service: Endoscopy;  Laterality: N/A;  Trudee Kuster hole Right 12/06/2014    Procedure: Right burr hole  for subdural hematoma;  Surgeon: Newman Pies, MD;  Location: [facility] NEURO ORS;  Service: Neurosurgery;  Laterality: Right;  . Irrigation and debridement foot Right 02/01/2015    Procedure: IRRIGATION AND DEBRIDEMENT FOOT/RIGHT GREAT TOE;  Surgeon: [name], DPM;  Location: [facility] ORS;  Service: Podiatry;  Laterality: Right;  . Eye surgery Right     cataract removed  . Amputation toe Right 03/01/2015    Procedure: AMPUTATION  RIGHT GREAT TOE;  Surgeon: [name], DPM;  Location: [facility] ORS;  Service: Podiatry;  Laterality: Right;    Family History  Problem Relation Age of Onset  . Diabetes Mother   . Heart disease Father   . Diabetes Sister   . Lung cancer Brother   . Cancer Brother   . Diabetes Brother   . Diabetes Sister     Social History:  reports that she has never smoked. She has never used smokeless tobacco. She reports that she does not drink alcohol or use illicit drugs.  Allergies:  Allergies  Allergen Reactions  . Codeine Other (See Comments)    Reaction:  Hallucinations    Medications: I have reviewed the patient's current medications.  Results for orders placed or performed during the hospital encounter of 04/02/15 (from the past 48  hour(s))  Surgical pcr screen     Status: None   Collection Time: 04/02/15 12:34 PM  Result Value Ref Range   MRSA, PCR NEGATIVE NEGATIVE   Staphylococcus aureus NEGATIVE NEGATIVE    Comment:        The Xpert SA Assay (FDA approved for NASAL specimens in patients over 62 years of age), is one component of a comprehensive surveillance program.  Test performance has been validated by [facility] for patients greater than or equal to 72 year old. It is not intended to diagnose infection nor to guide or monitor treatment.   Comprehensive metabolic panel      Status: Abnormal   Collection Time: 04/02/15  1:50 PM  Result Value Ref Range   Sodium 138 135 - 145 mmol/L   Potassium 4.1 3.5 - 5.1 mmol/L   Chloride 114 (H) 101 - 111 mmol/L   CO2 20 (L) 22 - 32 mmol/L   Glucose, Bld 137 (H) 65 - 99 mg/dL   BUN 33 (H) 6 - 20 mg/dL   Creatinine, Ser 1.25 (H) 0.44 - 1.00 mg/dL   Calcium 7.4 (L) 8.9 - 10.3 mg/dL   Total Protein 5.1 (L) 6.5 - 8.1 g/dL   Albumin 1.7 (L) 3.5 - 5.0 g/dL   AST 24 15 - 41 U/L   ALT 9 (L) 14 - 54 U/L   Alkaline Phosphatase 68 38 - 126 U/L   Total Bilirubin <0.1 (L) 0.3 - 1.2 mg/dL   GFR calc non Af Amer 43 (L) >60 mL/min   GFR calc Af Amer 49 (L) >60 mL/min    Comment: (NOTE) The eGFR has been calculated using the CKD EPI equation. This calculation has not been validated in all clinical situations. eGFR's persistently <60 mL/min signify possible Chronic Kidney Disease.    Anion gap 4 (L) 5 - 15  CBC WITH DIFFERENTIAL     Status: Abnormal   Collection Time: 04/02/15  1:50 PM  Result Value Ref Range   WBC 2.8 (L) 3.6 - 11.0 K/uL   RBC 2.54 (L) 3.80 - 5.20 MIL/uL   Hemoglobin 6.9 (L) 12.0 - 16.0 g/dL   HCT 21.4 (L) 35.0 - 47.0 %   MCV 84.2 80.0 - 100.0 fL   MCH 27.1 26.0 - 34.0 pg   MCHC 32.2 32.0 - 36.0 g/dL   RDW 16.2 (H) 11.5 - 14.5 %   Platelets 259 150 - 440 K/uL   Neutrophils Relative % 64 %   Neutro Abs 1.8 1.4 - 6.5 K/uL   Lymphocytes Relative 25 %   Lymphs Abs 0.7 (L) 1.0 - 3.6 K/uL   Monocytes Relative 4 %   Monocytes Absolute 0.1 (L) 0.2 - 0.9 K/uL   Eosinophils Relative 7 %   Eosinophils Absolute 0.2 0 - 0.7 K/uL   Basophils Relative 0 %   Basophils Absolute 0.0 0 - 0.1 K/uL  Protime-INR     Status: Abnormal   Collection Time: 04/02/15  1:50 PM  Result Value Ref Range   Prothrombin Time 16.6 (H) 11.4 - 15.0 seconds   INR 1.33   APTT     Status: None   Collection Time: 04/02/15  1:50 PM  Result Value Ref Range   aPTT 34 24 - 36 seconds  Rapid HIV screen (HIV 1/2 Ab+Ag)     Status: None    Collection Time: 04/02/15  1:50 PM  Result Value Ref Range   HIV-1 P24 Antigen - HIV24 NON REACTIVE NON REACTIVE  HIV 1/2 Antibodies NON REACTIVE NON REACTIVE   Interpretation (HIV Ag Ab)      A non reactive test result means that HIV 1 or HIV 2 antibodies and HIV 1 p24 antigen were not detected in the specimen.    No results found.  Review of Systems  Constitutional: Negative.   HENT: Negative.   Eyes: Negative.   Respiratory: Negative.   Cardiovascular: Negative.   Gastrointestinal: Negative.   Genitourinary: Negative.   Musculoskeletal: Negative.   Skin:       Open draining wound on her right foot after previous amputation site. Swelling in her right leg as compared to the left.  Neurological:       She does relate significant neuropathy in her feet related to her diabetes  Endo/Heme/Allergies: Negative.   Psychiatric/Behavioral: Negative.    Blood pressure 184/63, pulse 84, temperature 98.9 F (37.2 C), temperature source Oral, resp. rate 16, height _0  (1.702 m), weight 67.405 kg (148 lb 9.6 oz), SpO2 100 %. Physical Exam  Cardiovascular:  DP pulses palpable bilateral. PT pulse is trace bilateral and thready on the right  Musculoskeletal:  Previous amputation of the right great toe. Some hammertoe contractures are noted on the lesser toes. Muscle testing is deferred  Neurological:  Loss of protective threshold with a monofilament wire bilateral. Proprioception is impaired  Skin:  The skin is warm dry and somewhat atrophic. Absent hair growth. A full-thickness wound is present on the medial aspect of the right first metatarsal area. Fibrotic and some necrotic deep tissue is noted along the medial eminence. Probes directly down to the level of bone. The distal incision from the amputation site is incompletely healed at this point. Also noted is a full-thickness ulceration with exposed tendon on the dorsal aspect of the right second toe. Edema is noted in the second toe.    X-rays: Multiple views of the right foot taken in the office today reveal destruction of the first metatarsal head with some fragmentation noted as compared to previous x-rays. Previous amputation of the hallux. Also noted is a fracture through the second metatarsal head with some mild impaction and shortening. No clear evidence of erosive changes of the proximal phalanx on the second toe. Also noted was significant small and medium vessel calcification throughout the right foot  Assessment/Plan: Assessment: Osteomyelitis right first metatarsal. Full-thickness ulceration with exposed tendon right second toe. Fracture right second metatarsal. Diabetes with associated neuropathy.  Plan: Discussed with the patient the need for re-debridement of the first metatarsal. Also discussed amputating the second toe with a partial ray resection due to the pathologic fracture which may at this point be infected or most likely would be in the future if not addressed. Due to the nonhealing nature of the wounds over the last couple of months I would recommend and we will obtain a vascular consult for evaluation. We will obtain a consent form for debridement of bone right first metatarsal and second ray resection. Nothing by mouth after breakfast tomorrow. At this point we'll plan for surgery tomorrow evening.  Calypso Hagarty W. 04/02/2015, 3:19 PM

## 2015-04-02 NOTE — Consult Note (Signed)
ANTIBIOTIC CONSULT NOTE - INITIAL  Pharmacy Consult for vancomycin/zosyn Indication: wound infection/osteomyletis  Allergies  Allergen Reactions  . Codeine Other (See Comments)    Reaction:  Hallucinations    Patient Measurements: Height: 5\' 7"  (170.2 cm) Weight: 148 lb 9.6 oz (67.405 kg) IBW/kg (Calculated) : 61.6 Adjusted Body Weight: 67.4kg  Vital Signs: Temp: 98.9 F (37.2 C) (11/29 1228) Temp Source: Oral (11/29 1228) BP: 184/63 mmHg (11/29 1228) Pulse Rate: 84 (11/29 1228) Intake/Output from previous day:   Intake/Output from this shift:    Labs: No results for input(s): WBC, HGB, PLT, LABCREA, CREATININE in the last 72 hours. CrCl cannot be calculated (Patient has no serum creatinine result on file.). No results for input(s): VANCOTROUGH, VANCOPEAK, VANCORANDOM, GENTTROUGH, GENTPEAK, GENTRANDOM, TOBRATROUGH, TOBRAPEAK, TOBRARND, AMIKACINPEAK, AMIKACINTROU, AMIKACIN in the last 72 hours.   Microbiology: No results found for this or any previous visit (from the past 720 hour(s)).  Medical History: Past Medical History  Diagnosis Date  . Hypertension   . Diabetes mellitus without complication ([place])   . Anemia   . Coronary artery disease   . CHF (congestive heart failure) ([place])   . Glaucoma   . Hyperlipidemia   . Chronic kidney disease   . Neuropathy ([place])   . Chronic anemia   . Stroke ([place])   . Anginal pain ([place])   . Arthritis   . Retinopathy   . Cardiomyopathy ([place])   . Carotid artery stenosis   . Hoarseness, chronic   . GERD (gastroesophageal reflux disease)   . Hemangioma of liver   . Weight loss, unintentional     Medications:  Scheduled:  . sodium chloride   Intravenous Once  . acetaminophen  650 mg Oral Once  . diphenhydrAMINE  25 mg Oral Once  . docusate sodium  100 mg Oral BID  . enoxaparin (LOVENOX) injection  30 mg Subcutaneous Q24H  . furosemide  20 mg Intravenous Once  . piperacillin-tazobactam (ZOSYN)  IV  3.375 g Intravenous Once   . sodium chloride  3 mL Intravenous Q12H  . vancomycin  1,500 mg Intravenous Once   Assessment: Pt is a 69 year old female with ? osteomyletis of the 1st metatarsal head. and a wound infection post amputation of R great toe  Goal of Therapy:  Vancomycin trough level 15-20 mcg/ml  Plan:  Measure antibiotic drug levels at steady state Follow up culture results Vancomycin 1500mg  once then 1000mg  q 24 hours. Will check trough at steady state 1204 @ 1530   Zosyn 3.375 g q 8 hours EI infusion. Pharmacy to continue to monitor.   Neesa Knapik D Alexzander Dolinger 04/02/2015,1:30 PM

## 2015-04-02 NOTE — Progress Notes (Signed)
Spoke with Dr. Jannifer Franklin about pt hemoglobin. Went to 6.9 to 7.4 with 1 unit of blood pt is for procedure tomorrow evening. Dr. Jannifer Franklin does not want to transfuse more at this time.

## 2015-04-02 NOTE — Progress Notes (Signed)
Hgb 6.9, Dr Darvin Neighbours notified. States he will look into it.

## 2015-04-02 NOTE — H&P (Signed)
[facility] [employer] [facility] NAME: [name]    MR#:  [id number]  DATE OF BIRTH:  1945/07/11  DATE OF ADMISSION:  04/02/2015  PRIMARY CARE PHYSICIAN: Morton Peters, MD   REQUESTING/REFERRING PHYSICIAN: Dr. Vickki Muff  CHIEF COMPLAINT:  No chief complaint on file.   HISTORY OF PRESENT ILLNESS:  [name]  is a 69 y.o. female with a known history of right toe chronic ulcer, DM, Anemia has had right great toe ulcer for 4 months. Has had multiple debridements and 1 amputation. PICC with IV abx without complete resolution. Pt seen by Dr. Alvera Singh office and sent in as direct admission for amputation, PICC, IV abx. Afebrile. No SOB/CP/Abd pain. Has chronic LE edema  PAST MEDICAL HISTORY:   Past Medical History  Diagnosis Date  . Hypertension   . Diabetes mellitus without complication ([place])   . Anemia   . Coronary artery disease   . CHF (congestive heart failure) ([place])   . Glaucoma   . Hyperlipidemia   . Chronic kidney disease   . Neuropathy ([place])   . Chronic anemia   . Stroke ([place])   . Anginal pain ([place])   . Arthritis   . Retinopathy   . Cardiomyopathy ([place])   . Carotid artery stenosis   . Hoarseness, chronic   . GERD (gastroesophageal reflux disease)   . Hemangioma of liver   . Weight loss, unintentional     PAST SURGICAL HISTORY:   Past Surgical History  Procedure Laterality Date  . Cardiac catheterization    . Esophagogastroduodenoscopy (egd) with propofol N/A 09/24/2014    Elliott-gastritis & normal Billroth II changes   . Colonoscopy with propofol N/A 09/24/2014    Elliott-Incomplete secondary to prep  . Coronary angioplasty  2016    stent placed  . Bilroth ii procedure    . Colonoscopy with propofol N/A 11/12/2014    Procedure: COLONOSCOPY WITH PROPOFOL;  Surgeon: Manya Silvas, MD;  Location: [facility] ENDOSCOPY;  Service: Endoscopy;  Laterality: N/A;  Trudee Kuster hole Right 12/06/2014    Procedure: Right  burr hole  for subdural hematoma;  Surgeon: Newman Pies, MD;  Location: [facility] NEURO ORS;  Service: Neurosurgery;  Laterality: Right;  . Irrigation and debridement foot Right 02/01/2015    Procedure: IRRIGATION AND DEBRIDEMENT FOOT/RIGHT GREAT TOE;  Surgeon: [name], DPM;  Location: [facility] ORS;  Service: Podiatry;  Laterality: Right;  . Eye surgery Right     cataract removed  . Amputation toe Right 03/01/2015    Procedure: AMPUTATION  RIGHT GREAT TOE;  Surgeon: [name], DPM;  Location: [facility] ORS;  Service: Podiatry;  Laterality: Right;    SOCIAL HISTORY:   Social History  Substance Use Topics  . Smoking status: Never Smoker   . Smokeless tobacco: Never Used     Comment: quit many years ago  . Alcohol Use: No    FAMILY HISTORY:   Family History  Problem Relation Age of Onset  . Diabetes Mother   . Heart disease Father   . Diabetes Sister   . Lung cancer Brother   . Cancer Brother   . Diabetes Brother   . Diabetes Sister     DRUG ALLERGIES:   Allergies  Allergen Reactions  . Codeine Other (See Comments)    Reaction:  Hallucinations    REVIEW OF SYSTEMS:   Review of Systems  Constitutional: Negative for fever, chills, weight loss and malaise/fatigue.  HENT: Negative for  hearing loss and nosebleeds.   Eyes: Negative for blurred vision, double vision and pain.  Respiratory: Negative for cough, hemoptysis, sputum production, shortness of breath and wheezing.   Cardiovascular: Positive for leg swelling. Negative for chest pain, palpitations and orthopnea.  Gastrointestinal: Negative for nausea, vomiting, abdominal pain, diarrhea and constipation.  Genitourinary: Negative for dysuria and hematuria.  Musculoskeletal: Negative for myalgias, back pain and falls.  Skin: Negative for rash.  Neurological: Negative for dizziness, tremors, sensory change, speech change, focal weakness, seizures and headaches.  Endo/Heme/Allergies: Does not bruise/bleed easily.   Psychiatric/Behavioral: Negative for depression and memory loss. The patient is not nervous/anxious.     MEDICATIONS AT HOME:   Prior to Admission medications   Medication Sig Start Date End Date Taking? Authorizing Provider  acetaminophen (TYLENOL) 325 MG tablet Take 2 tablets (650 mg total) by mouth every 4 (four) hours as needed for mild pain (or temp > 99 F). Patient not taking: Reported on 03/04/2015 12/13/14   Delfina Redwood, MD  amoxicillin-clavulanate (AUGMENTIN) 875-125 MG tablet Take 1 tablet by mouth 2 (two) times daily. 03/01/15   [name], DPM  docusate sodium (COLACE) 100 MG capsule Take 1 capsule (100 mg total) by mouth 2 (two) times daily. 02/05/15   Lucilla Lame, MD  feeding supplement, GLUCERNA SHAKE, (GLUCERNA SHAKE) LIQD Take 237 mLs by mouth 2 (two) times daily between meals. 09/20/14   Loletha Grayer, MD  ferrous sulfate 325 (65 FE) MG tablet Take 325 mg by mouth daily.    Historical Provider, MD  folic acid (FOLVITE) 1 MG tablet Take 1 tablet (1 mg total) by mouth daily. 02/22/15   Lequita Asal, MD  furosemide (LASIX) 40 MG tablet Take 1 tablet (40 mg total) by mouth 2 (two) times daily. 11/02/14   Alisa Graff, FNP  glimepiride (AMARYL) 2 MG tablet Take 1 mg by mouth daily.    Historical Provider, MD  hydrALAZINE (APRESOLINE) 100 MG tablet Take 100 mg by mouth 3 (three) times daily.     Historical Provider, MD  HYDROcodone-acetaminophen (NORCO) 5-325 MG tablet Take 1 tablet by mouth every 6 (six) hours as needed for moderate pain. 03/01/15   [name], DPM  HYDROcodone-acetaminophen (NORCO/VICODIN) 5-325 MG tablet Take 1-2 tablets by mouth every 4 (four) hours as needed for moderate pain. 02/02/15   Theodoro Grist, MD  isosorbide dinitrate (ISORDIL) 30 MG tablet Take 30 mg by mouth 2 (two) times daily.    Historical Provider, MD  labetalol (NORMODYNE) 100 MG tablet Take 100 mg by mouth.    Historical Provider, MD  nitroGLYCERIN (NITROSTAT) 0.4 MG SL tablet  Place 0.4 mg under the tongue every 5 (five) minutes as needed for chest pain.    Historical Provider, MD  omeprazole (PRILOSEC) 20 MG capsule Take 20 mg by mouth daily.    Historical Provider, MD  potassium chloride SA (K-DUR,KLOR-CON) 20 MEQ tablet Take 1 tablet (20 mEq total) by mouth daily. 11/02/14   Alisa Graff, FNP  propranolol (INDERAL) 20 MG tablet Take 40 mg by mouth 3 (three) times daily.    Historical Provider, MD  ramipril (ALTACE) 5 MG capsule Take 5 mg by mouth daily.    Historical Provider, MD  simvastatin (ZOCOR) 20 MG tablet Take 20 mg by mouth at bedtime.    Historical Provider, MD  traMADol (ULTRAM) 50 MG tablet Take 0.5-1 tablets (25-50 mg total) by mouth 3 (three) times daily as needed for moderate pain. 12/13/14   Corinna  Greggory Keen, MD      VITAL SIGNS:  Blood pressure 184/63, pulse 84, temperature 98.9 F (37.2 C), temperature source Oral, resp. rate 16, height 5\' 7"  (1.702 m), weight 67.405 kg (148 lb 9.6 oz), SpO2 100 %.  PHYSICAL EXAMINATION:  Physical Exam  GENERAL:  69 y.o.-year-old patient lying in the bed with no acute distress.  EYES: Pupils equal, round, reactive to light and accommodation. No scleral icterus. Extraocular muscles intact.  HEENT: Head atraumatic, normocephalic. Oropharynx and nasopharynx clear. No oropharyngeal erythema, moist oral mucosa  NECK:  Supple, no jugular venous distention. No thyroid enlargement, no tenderness.  LUNGS: Normal breath sounds bilaterally, no wheezing, rales, rhonchi. No use of accessory muscles of respiration.  CARDIOVASCULAR: S1, S2 normal. No murmurs, rubs, or gallops.  ABDOMEN: Soft, nontender, nondistended. Bowel sounds present. No organomegaly or mass.  EXTREMITIES: No, cyanosis, or clubbing. + 2 pedal & radial pulses b/l.  Bilateral edema NEUROLOGIC: Cranial nerves II through XII are intact. No focal Motor or sensory deficits appreciated b/l PSYCHIATRIC: The patient is alert and oriented x 3. Good affect.   SKIN: dressing over right foot wound  LABORATORY PANEL:   CBC No results for input(s): WBC, HGB, HCT, PLT in the last 168 hours. ------------------------------------------------------------------------------------------------------------------  Chemistries  No results for input(s): NA, K, CL, CO2, GLUCOSE, BUN, CREATININE, CALCIUM, MG, AST, ALT, ALKPHOS, BILITOT in the last 168 hours.  Invalid input(s): GFRCGP ------------------------------------------------------------------------------------------------------------------  Cardiac Enzymes No results for input(s): TROPONINI in the last 168 hours. ------------------------------------------------------------------------------------------------------------------  RADIOLOGY:  No results found.   IMPRESSION AND PLAN:   * Right foot osteomyelitis Consult podiatry and ID. IV vanc and Zosyn. Wait for Intra op cx. PICC eventually depending on surgical findings Bcx x 2  * HTN, uncontrolled Clonidine 1 dose. Restart home meds.  * DM SSI, ADA  * DVT prophylaxis Lovenox   All the records are reviewed and case discussed with ED provider. Management plans discussed with the patient, family and they are in agreement.  CODE STATUS: FULL  TOTAL TIME TAKING CARE OF THIS PATIENT: 45 minutes.    Hillary Bow R M.D on 04/02/2015 at 1:12 PM  Between 7am to 6pm - Pager - [phone]  After 6pm go to [email or web address] - password [email or web address] [facility]  Office  [phone]  CC: Primary care physician; Morton Peters, MD     Note: This dictation was prepared with Dragon dictation along with smaller phrase technology. Any transcriptional errors that result from this process are unintentional.

## 2015-04-03 ENCOUNTER — Encounter: Payer: Self-pay | Admitting: Anesthesiology

## 2015-04-03 ENCOUNTER — Inpatient Hospital Stay: Payer: Medicare HMO

## 2015-04-03 ENCOUNTER — Inpatient Hospital Stay: Payer: Medicare HMO | Admitting: Anesthesiology

## 2015-04-03 ENCOUNTER — Encounter
Admission: AD | Disposition: A | Discharge status: Home Health | Payer: Self-pay | Source: Ambulatory Visit | Attending: Internal Medicine

## 2015-04-03 HISTORY — PX: IRRIGATION AND DEBRIDEMENT FOOT: SHX6602

## 2015-04-03 LAB — TYPE AND SCREEN
ABO/RH(D): O POS
Antibody Screen: NEGATIVE
UNIT DIVISION: 0

## 2015-04-03 LAB — SEDIMENTATION RATE: Sed Rate: 37 mm/hr — ABNORMAL HIGH (ref 0–30)

## 2015-04-03 LAB — C-REACTIVE PROTEIN: CRP: 5 mg/dL — AB (ref <1.0)

## 2015-04-03 SURGERY — IRRIGATION AND DEBRIDEMENT FOOT
Anesthesia: General | Site: Foot | Laterality: Right | Wound class: Dirty or Infected

## 2015-04-03 MED ORDER — OXYCODONE HCL 5 MG/5ML PO SOLN: 5.0000 mg | Freq: Once | ORAL | Status: DC | PRN

## 2015-04-03 MED ORDER — LIDOCAINE HCL (CARDIAC) 20 MG/ML IV SOLN
INTRAVENOUS | Status: DC | PRN
  Administered 2015-04-03: 60 mg via INTRAVENOUS

## 2015-04-03 MED ORDER — SODIUM CHLORIDE 0.9 % IJ SOLN: 3.0000 mL | INTRAMUSCULAR | Status: DC | PRN

## 2015-04-03 MED ORDER — SODIUM CHLORIDE 0.9 % IJ SOLN
3.0000 mL | Freq: Two times a day (BID) | INTRAMUSCULAR | Status: DC
  Administered 2015-04-04 – 2015-04-08 (×7): 3 mL via INTRAVENOUS

## 2015-04-03 MED ORDER — GLUCERNA SHAKE PO LIQD
237.0000 mL | Freq: Three times a day (TID) | ORAL | Status: DC
  Administered 2015-04-05 – 2015-04-08 (×9): 237 mL via ORAL

## 2015-04-03 MED ORDER — MORPHINE SULFATE (PF) 2 MG/ML IV SOLN
2.0000 mg | INTRAVENOUS | Status: DC | PRN
  Administered 2015-04-04 (×2): 2 mg via INTRAVENOUS
  Filled 2015-04-03 (×2): qty 1

## 2015-04-03 MED ORDER — BUPIVACAINE HCL (PF) 0.5 % IJ SOLN
INTRAMUSCULAR | Status: DC | PRN
  Administered 2015-04-03: 15 mL

## 2015-04-03 MED ORDER — FENTANYL CITRATE (PF) 100 MCG/2ML IJ SOLN: 25.0000 ug | INTRAMUSCULAR | Status: DC | PRN

## 2015-04-03 MED ORDER — ENOXAPARIN SODIUM 40 MG/0.4ML ~~LOC~~ SOLN
40.0000 mg | SUBCUTANEOUS | Status: DC
  Administered 2015-04-04 – 2015-04-06 (×3): 40 mg via SUBCUTANEOUS
  Filled 2015-04-03 (×4): qty 0.4

## 2015-04-03 MED ORDER — HYDRALAZINE HCL 20 MG/ML IJ SOLN
10.0000 mg | Freq: Four times a day (QID) | INTRAMUSCULAR | Status: DC | PRN
  Administered 2015-04-03: 10 mg via INTRAVENOUS
  Filled 2015-04-03: qty 1

## 2015-04-03 MED ORDER — FENTANYL CITRATE (PF) 100 MCG/2ML IJ SOLN
INTRAMUSCULAR | Status: DC | PRN
  Administered 2015-04-03 (×2): 25 ug via INTRAVENOUS

## 2015-04-03 MED ORDER — RAMIPRIL 10 MG PO CAPS
10.0000 mg | ORAL_CAPSULE | Freq: Every day | ORAL | Status: DC
  Administered 2015-04-05 – 2015-04-08 (×4): 10 mg via ORAL
  Filled 2015-04-03: qty 8
  Filled 2015-04-03 (×4): qty 1
  Filled 2015-04-03: qty 8

## 2015-04-03 MED ORDER — SODIUM CHLORIDE 0.9 % IV SOLN: 250.0000 mL | INTRAVENOUS | Status: DC | PRN

## 2015-04-03 MED ORDER — CLONIDINE HCL 0.1 MG PO TABS
0.1000 mg | ORAL_TABLET | Freq: Three times a day (TID) | ORAL | Status: DC
  Administered 2015-04-03 – 2015-04-07 (×11): 0.1 mg via ORAL
  Filled 2015-04-03 (×12): qty 1

## 2015-04-03 MED ORDER — OXYCODONE HCL 5 MG PO TABS: 5.0000 mg | ORAL_TABLET | Freq: Once | ORAL | Status: DC | PRN

## 2015-04-03 MED ORDER — PROPOFOL 10 MG/ML IV BOLUS
INTRAVENOUS | Status: DC | PRN
  Administered 2015-04-03 (×3): 10 mg via INTRAVENOUS
  Administered 2015-04-03: 40 mg via INTRAVENOUS
  Administered 2015-04-03 (×3): 10 mg via INTRAVENOUS

## 2015-04-03 SURGICAL SUPPLY — 55 items
BAG COUNTER SPONGE EZ (MISCELLANEOUS) ×1 IMPLANT
BAG SPNG 4X4 CLR HAZ (MISCELLANEOUS)
BANDAGE ELASTIC 4 LF NS (GAUZE/BANDAGES/DRESSINGS) ×3 IMPLANT
BLADE MED AGGRESSIVE (BLADE) ×2 IMPLANT
BLADE OSCILLATING/SAGITTAL (BLADE) ×3
BLADE SURG 15 STRL LF DISP TIS (BLADE) ×1 IMPLANT
BLADE SURG 15 STRL SS (BLADE) ×3
BLADE SW THK.38XMED LNG THN (BLADE) ×1 IMPLANT
BNDG CMPR 75X21 PLY HI ABS (MISCELLANEOUS) ×1
BNDG CMPR MED 5X4 ELC HKLP NS (GAUZE/BANDAGES/DRESSINGS) ×1
BNDG ESMARK 4X12 TAN STRL LF (GAUZE/BANDAGES/DRESSINGS) ×1 IMPLANT
BNDG GAUZE 4.5X4.1 6PLY STRL (MISCELLANEOUS) ×5 IMPLANT
CANISTER SUCT 1200ML W/VALVE (MISCELLANEOUS) ×3 IMPLANT
COUNTER SPONGE BAG EZ (MISCELLANEOUS)
CUFF TOURN 18 STER (MISCELLANEOUS) ×1 IMPLANT
CUFF TOURN DUAL PL 12 NO SLV (MISCELLANEOUS) ×3 IMPLANT
DRAPE FLUOR MINI C-ARM 54X84 (DRAPES) ×3 IMPLANT
DURAPREP 26ML APPLICATOR (WOUND CARE) ×3 IMPLANT
GAUZE FLUFF 18X24 1PLY STRL (GAUZE/BANDAGES/DRESSINGS) ×1 IMPLANT
GAUZE PETRO XEROFOAM 1X8 (MISCELLANEOUS) ×3 IMPLANT
GAUZE SPONGE 4X4 12PLY STRL (GAUZE/BANDAGES/DRESSINGS) ×5 IMPLANT
GAUZE STRETCH 2X75IN STRL (MISCELLANEOUS) ×3 IMPLANT
GLOVE BIO SURGEON STRL SZ7.5 (GLOVE) ×3 IMPLANT
GLOVE INDICATOR 8.0 STRL GRN (GLOVE) ×3 IMPLANT
GOWN STRL REUS W/ TWL LRG LVL3 (GOWN DISPOSABLE) ×2 IMPLANT
GOWN STRL REUS W/TWL LRG LVL3 (GOWN DISPOSABLE) ×6
HANDPIECE VERSAJET DEBRIDEMENT (MISCELLANEOUS) ×3 IMPLANT
KIT STIMULAN RAPID CURE 5CC (Orthopedic Implant) ×2 IMPLANT
LABEL OR SOLS (LABEL) ×1 IMPLANT
NDL FILTER BLUNT 18X1 1/2 (NEEDLE) ×1 IMPLANT
NDL HYPO 25X1 1.5 SAFETY (NEEDLE) ×3 IMPLANT
NEEDLE FILTER BLUNT 18X 1/2SAF (NEEDLE) ×2
NEEDLE FILTER BLUNT 18X1 1/2 (NEEDLE) ×1 IMPLANT
NEEDLE HYPO 25X1 1.5 SAFETY (NEEDLE) ×9 IMPLANT
NS IRRIG 500ML POUR BTL (IV SOLUTION) ×3 IMPLANT
PACK EXTREMITY ARMC (MISCELLANEOUS) ×3 IMPLANT
PAD ABD DERMACEA PRESS 5X9 (GAUZE/BANDAGES/DRESSINGS) ×6 IMPLANT
PAD GROUND ADULT SPLIT (MISCELLANEOUS) ×3 IMPLANT
PENCIL ELECTRO HAND CTR (MISCELLANEOUS) ×3 IMPLANT
RASP SM TEAR CROSS CUT (RASP) ×3 IMPLANT
SOL PREP PVP 2OZ (MISCELLANEOUS) ×3
SOLUTION PREP PVP 2OZ (MISCELLANEOUS) ×1 IMPLANT
STOCKINETTE 48X4 2 PLY STRL (GAUZE/BANDAGES/DRESSINGS) ×1 IMPLANT
STOCKINETTE STRL 4IN 9604848 (GAUZE/BANDAGES/DRESSINGS) IMPLANT
STOCKINETTE STRL 6IN 960660 (GAUZE/BANDAGES/DRESSINGS) ×3 IMPLANT
STRAP SAFETY BODY (MISCELLANEOUS) ×3 IMPLANT
SUT ETHIBOND 4-0 (SUTURE) IMPLANT
SUT ETHILON 4-0 (SUTURE) ×9
SUT ETHILON 4-0 FS2 18XMFL BLK (SUTURE) ×3
SUT VIC AB 3-0 SH 27 (SUTURE) ×3
SUT VIC AB 3-0 SH 27X BRD (SUTURE) ×1 IMPLANT
SUT VIC AB 4-0 FS2 27 (SUTURE) ×5 IMPLANT
SUTURE ETHLN 4-0 FS2 18XMF BLK (SUTURE) ×1 IMPLANT
SYR 3ML LL SCALE MARK (SYRINGE) ×3 IMPLANT
SYRINGE 10CC LL (SYRINGE) ×6 IMPLANT

## 2015-04-03 NOTE — Anesthesia Preprocedure Evaluation (Signed)
Anesthesia Evaluation  Patient identified by MRN, date of birth, ID band Patient awake    Reviewed: Allergy & Precautions, H&P , NPO status , Patient's Chart, lab work & pertinent test results  History of Anesthesia Complications Negative for: history of anesthetic complications  Airway Mallampati: III  TM Distance: >3 FB Neck ROM: limited    Dental  (+) Poor Dentition, Chipped, Missing, Partial Lower, Partial Upper   Pulmonary neg pulmonary ROS, neg shortness of breath,    Pulmonary exam normal breath sounds clear to auscultation       Cardiovascular Exercise Tolerance: Good hypertension, (-) angina+ CAD, + Past MI, + Peripheral Vascular Disease and +CHF  (-) DOE Normal cardiovascular exam Rhythm:regular Rate:Normal     Neuro/Psych CVA, Residual Symptoms negative psych ROS   GI/Hepatic Neg liver ROS, GERD  Controlled,  Endo/Other  diabetes, Poorly Controlled, Type 2, Insulin Dependent  Renal/GU Renal disease  negative genitourinary   Musculoskeletal  (+) Arthritis ,   Abdominal   Peds  Hematology negative hematology ROS (+)   Anesthesia Other Findings Past Medical History:   Hypertension                                                 Diabetes mellitus without complication (HCC)                 Anemia                                                       Coronary artery disease                                      CHF (congestive heart failure) (HCC)                         Glaucoma                                                     Hyperlipidemia                                               Chronic kidney disease                                       Neuropathy (HCC)                                             Chronic anemia  Stroke ([place])                                                 Anginal pain (HCC)                                           Arthritis                                                     Retinopathy                                                  Cardiomyopathy ([place])                                         Carotid artery stenosis                                      Hoarseness, chronic                                          GERD (gastroesophageal reflux disease)                       Hemangioma of liver                                          Weight loss, unintentional                                  Past Surgical History:   CARDIAC CATHETERIZATION                                       ESOPHAGOGASTRODUODENOSCOPY (EGD) WITH PROPOFOL  N/A 09/24/2014      Comment:Elliott-gastritis & normal Billroth II changes    COLONOSCOPY WITH PROPOFOL                       N/A 09/24/2014      Comment:Elliott-Incomplete secondary to prep   CORONARY ANGIOPLASTY                             2016           Comment:stent placed   BILROTH II PROCEDURE  COLONOSCOPY WITH PROPOFOL                       N/A 11/12/2014      Comment:Procedure: COLONOSCOPY WITH PROPOFOL;  Surgeon:              Manya Silvas, MD;  Location: [facility]               ENDOSCOPY;  Service: Endoscopy;  Laterality:               N/A;   BURR HOLE                                       Right 12/06/2014       Comment:Procedure: Right burr hole  for subdural               hematoma;  Surgeon: Newman Pies, MD;                Location: [facility] NEURO ORS;  Service: Neurosurgery;              Laterality: Right;   IRRIGATION AND DEBRIDEMENT FOOT                 Right 02/01/2015      Comment:Procedure: IRRIGATION AND DEBRIDEMENT               FOOT/RIGHT GREAT TOE;  Surgeon: [name],               DPM;  Location: [facility] ORS;  Service: Podiatry;                Laterality: Right;   EYE SURGERY                                     Right                Comment:cataract removed   AMPUTATION TOE                                  Right 03/01/2015      Comment:Procedure: AMPUTATION  RIGHT GREAT TOE;                Surgeon: [name], DPM;  Location: [facility]               ORS;  Service: Podiatry;  Laterality: Right;  BMI    Body Mass Index   23.48 kg/m 2      Reproductive/Obstetrics negative OB ROS                             Anesthesia Physical Anesthesia Plan  ASA: IV  Anesthesia Plan: MAC, General and Regional   Post-op Pain Management: GA combined w/ Regional for post-op pain   Induction:   Airway Management Planned:   Additional Equipment:   Intra-op Plan:   Post-operative Plan:   Informed Consent: I have reviewed the patients History and Physical, chart, labs and discussed the procedure including the risks, benefits and alternatives for the proposed anesthesia with the patient or authorized representative who has indicated his/her understanding and acceptance.   Dental Advisory Given  Plan Discussed with: Anesthesiologist, CRNA and  Surgeon  Anesthesia Plan Comments:         Anesthesia Quick Evaluation

## 2015-04-03 NOTE — Progress Notes (Signed)
Dr Anselm Jungling called back, PRN medication ordered for BP, [place] ordered for right lower leg to rule out DVT.

## 2015-04-03 NOTE — Op Note (Signed)
Date of operation: 04/03/2015.  Surgeon: Durward Fortes DPM.  Preoperative diagnosis: 1. Osteomyelitis right first metatarsal. 2. Full-thickness ulceration with hammertoe right second toe. 3. Fracture second metatarsal head right foot.  Postoperative diagnosis: Same.  Procedures: 1. Debridement infected bone right first metatarsal. 2. Second toe amputation with partial ray resection second metatarsal  Anesthesia: Local Mac.  Hemostasis: Pneumatic tourniquet right ankle 250 mmHg.  Estimated blood loss 5 cc.  Pathology: 1. Right first metatarsal. 2. Second toe with metatarsophalangeal joint.  Implants: Stimulan rapid cure antibiotic beads impregnated with vancomycin.  Drains: None.  Complications: None apparent.  Operative indications: This is a 69 year old female with a two-month history of ulcerations and chronic infection in her right great toe with previous amputation of the great toe. Has continued to have nonhealing wound with a new ulcerative area at the amputation site. Serial x-rays showed destruction of bone consistent with persistent osteomyelitis. Re-debridement of the right foot for removal of infected first metatarsal bone as well as for a second toe amputation with partial ray resection.  Operative procedure: Patient was taken to the operating room and placed on the table in the supine position. Following satisfactory sedation the right foot was anesthetized with 15 cc of 0.5% bupivacaine plain. A pneumatic tourniquet was applied at the level of the right ankle and the foot was prepped and draped in the usual sterile fashion. The foot was exsanguinated and the tourniquet inflated to 250 mmHg. Attention was then directed to the dorsal aspect of the right foot at the previous amputation site. Incision was made along the dorsal aspect of the first metatarsal from proximal to distal approximately 5 cm in length. Incision was deepened sharply down to the level of the bone which was  freed from the surrounding normal anatomy. There was noted to be pathologic fracture with significant devitalized tissue along the medial eminence at the ulceration. Using a pneumatic saw the distal third of the metatarsal was transected and removed in total. Using a ronguer some deep bone fragments were then excised. The wound was then debrided with a versa jet debrider on a setting of 6. Good healthy tissues were noted to be remaining. At this point attention was then directed to the second toe where an elliptical incision was made coursing dorsal to plantar around the base of the second toe. Incision was carried sharply down to the level of the bone which was freed from the surrounding normal anatomy. Using a bone saw the distal aspect of the second metatarsal was transected and the entire toe and metatarsophalangeal joint was removed in toto. There was noted to be a connection between the second metatarsophalangeal joint and the first metatarsophalangeal joint. This cavity was then again debrided with a versa jet  debrider on a setting of 6. The wound was then copiously irrigated with the versa jet on a setting of 3 followed by flushing with saline from a bulb syringe. At this point the medial wound at the first metatarsal ulceration was closed using 4-0 nylon simple interrupted sutures with partial closure of the first and second metatarsal incisions. The defects in both of these areas were then filled with Stimulan rapid cure beads impregnated with vancomycin. The remainder of the incision were then closed using 4-0 nylon vertical mattress and simple interrupted sutures. Xeroform and sterile bandages applied followed by Kerlix and an Ace wrap. The tourniquet was released. She tolerated the procedure and anesthesia well and was transported to the PACU with vital signs stable  and in good condition.

## 2015-04-03 NOTE — Transfer of Care (Signed)
Immediate Anesthesia Transfer of Care Note  Patient: [name]  Procedure(s) Performed: Procedure(s): IRRIGATION AND DEBRIDEMENT FOOT (Right)  Patient Location: PACU  Anesthesia Type:General and Regional  Level of Consciousness: awake, alert  and patient cooperative  Airway & Oxygen Therapy: Patient Spontanous Breathing  Post-op Assessment: Report given to RN and Post -op Vital signs reviewed and stable  Post vital signs: Reviewed and stable  Last Vitals:  Filed Vitals:   04/03/15 1256 04/03/15 1557  BP: 182/74 200/80  Pulse:  78  Temp:  37.3 C  Resp:      Complications: No apparent anesthesia complications

## 2015-04-03 NOTE — Consult Note (Signed)
WOC wound consult note Reason for Consult: Patient admitted for surgical debridement and antibiotic therapy, involving the first and second metatarsals on the right foot. Admitted with a stage 2 pressure injury to sacrum and coccyx.   Wound type:Stage 2 pressure injury to sacrum Pressure Ulcer POA: Yes Measurement:2 cm x 1 1.5 cm x 0.1 cm  Wound bed:100% pink and moist Drainage (amount, consistency, odor) scant serous drainage.  Periwound:intact Dressing procedure/placement/frequency: Cleanse sacral wound with soap and water and pat gently dry.  Apply Allevyn silicone border sacral dressing.  Change every 3 days and PRN soilage.   Will not follow at this time.  Please re-consult if needed.  Domenic Moras RN BSN [place] Pager [phone]

## 2015-04-03 NOTE — Progress Notes (Signed)
Dr Anselm Jungling paged, patient's right lower leg swollen, not painful. Patients BP is elevated. Waiting on callback

## 2015-04-03 NOTE — Progress Notes (Signed)
[facility] [place] NAME: [name]    MR#:  [id number]  DATE OF BIRTH:  1946/03/08  SUBJECTIVE:  CHIEF COMPLAINT: leg swelling and pain on right side.  REVIEW OF SYSTEMS:  CONSTITUTIONAL: No fever, fatigue or weakness.  EYES: No blurred or double vision.  EARS, NOSE, AND THROAT: No tinnitus or ear pain.  RESPIRATORY: No cough, shortness of breath, wheezing or hemoptysis.  CARDIOVASCULAR: No chest pain, orthopnea, edema.  GASTROINTESTINAL: No nausea, vomiting, diarrhea or abdominal pain.  GENITOURINARY: No dysuria, hematuria.  ENDOCRINE: No polyuria, nocturia,  HEMATOLOGY: No anemia, easy bruising or bleeding SKIN: No rash or lesion. MUSCULOSKELETAL: No joint pain or arthritis.   NEUROLOGIC: No tingling, numbness, weakness.  PSYCHIATRY: No anxiety or depression.   ROS  DRUG ALLERGIES:   Allergies  Allergen Reactions  . Codeine Other (See Comments)    Reaction:  Hallucinations    VITALS:  Blood pressure 162/54, pulse 64, temperature 97.9 F (36.6 C), temperature source Oral, resp. rate 16, height 5\' 7"  (1.702 m), weight 68.04 kg (150 lb), SpO2 97 %.  PHYSICAL EXAMINATION:  GENERAL:  69 y.o.-year-old patient lying in the bed with no acute distress.  EYES: Pupils equal, round, reactive to light and accommodation. No scleral icterus. Extraocular muscles intact.  HEENT: Head atraumatic, normocephalic. Oropharynx and nasopharynx clear.  NECK:  Supple, no jugular venous distention. No thyroid enlargement, no tenderness.  LUNGS: Normal breath sounds bilaterally, no wheezing, rales,rhonchi or crepitation. No use of accessory muscles of respiration.  CARDIOVASCULAR: S1, S2 normal. No murmurs, rubs, or gallops.  ABDOMEN: Soft, nontender, nondistended. Bowel sounds present. No organomegaly or mass.  EXTREMITIES: right leg pedal edema, no cyanosis, or clubbing. Wound present on right foot. NEUROLOGIC: Cranial nerves II through  XII are intact. Muscle strength 5/5 in all extremities. Sensation intact. Gait not checked.  PSYCHIATRIC: The patient is alert and oriented x 3.  SKIN: No obvious rash, lesion, or ulcer.   Physical Exam LABORATORY PANEL:   CBC  Recent Labs Lab 04/02/15 1350 04/02/15 2230  WBC 2.8*  --   HGB 6.9* 7.4*  HCT 21.4* 22.2*  PLT 259  --    ------------------------------------------------------------------------------------------------------------------  Chemistries   Recent Labs Lab 04/02/15 1350  NA 138  K 4.1  CL 114*  CO2 20*  GLUCOSE 137*  BUN 33*  CREATININE 1.25*  CALCIUM 7.4*  AST 24  ALT 9*  ALKPHOS 68  BILITOT <0.1*   ------------------------------------------------------------------------------------------------------------------  Cardiac Enzymes No results for input(s): TROPONINI in the last 168 hours. ------------------------------------------------------------------------------------------------------------------  RADIOLOGY:  US Venous Img Lower Unilateral Right  04/03/2015  CLINICAL DATA:  Right lower extremity swelling. EXAM: RIGHT LOWER EXTREMITY VENOUS DOPPLER ULTRASOUND TECHNIQUE: Gray-scale sonography with graded compression, as well as color Doppler and duplex ultrasound were performed to evaluate the lower extremity deep venous systems from the level of the common femoral vein and including the common femoral, femoral, profunda femoral, popliteal and calf veins including the posterior tibial, peroneal and gastrocnemius veins when visible. The superficial great saphenous vein was also interrogated. Spectral Doppler was utilized to evaluate flow at rest and with distal augmentation maneuvers in the common femoral, femoral and popliteal veins. COMPARISON:  MRI 01/31/2015.  ULTRASOUND 01/29/2015. FINDINGS: Contralateral Common Femoral Vein: Respiratory phasicity is normal and symmetric with the symptomatic side. No evidence of thrombus. Normal compressibility.  Common Femoral Vein: No evidence of thrombus. Normal compressibility, respiratory phasicity and response to augmentation. Saphenofemoral Junction: No  evidence of thrombus. Normal compressibility and flow on color Doppler imaging. Profunda Femoral Vein: No evidence of thrombus. Normal compressibility and flow on color Doppler imaging. Femoral Vein: No evidence of thrombus. Normal compressibility, respiratory phasicity and response to augmentation. Popliteal Vein: No evidence of thrombus. Normal compressibility, respiratory phasicity and response to augmentation. Calf Veins: No evidence of thrombus. Normal compressibility and flow on color Doppler imaging. Superficial Great Saphenous Vein: No evidence of thrombus. Normal compressibility and flow on color Doppler imaging. Venous Reflux:  None. Other Findings: 5.0 x 1.1 x 4.3 cm complex cystic fluid collection in the right popliteal fossa consistent with Baker cyst. 2.8 x 0.4 x 0.9 cm density noted in the right inguinal region consistent with a lymph node. Similar findings noted on prior exam. IMPRESSION: No evidence of deep venous thrombosis. 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 IV vanc and Zosyn. Going for debridement sx today. PICC eventually depending on surgical findings Bcx x 2  * HTN, uncontrolled Clonidine 1 dose. Restart home meds.  * DM SSI, ADA  * DVT prophylaxis Lovenox Get doppler study due to swelling on right leg.   All the records are reviewed and case discussed with Care Management/Social Workerr. Management plans discussed with the patient, family and they are in agreement.  CODE STATUS: full  TOTAL TIME TAKING CARE OF THIS PATIENT: 35 minutes.    POSSIBLE D/C IN 1-2 DAYS, DEPENDING ON CLINICAL  CONDITION.   Vaughan Basta M.D on 04/03/2015   Between 7am to 6pm - Pager - [phone]  After 6pm go to [email or web address] - password [email or web address] [facility]  Office  [phone]  CC: Primary care physician; Morton Peters, MD  Note: This dictation was prepared with Dragon dictation along with smaller phrase technology. Any transcriptional errors that result from this process are unintentional.

## 2015-04-03 NOTE — Interval H&P Note (Signed)
History and Physical Interval Note:  04/03/2015 6:35 PM  [name]  has presented today for surgery, with the diagnosis of osteomyelititis right foot  The various methods of treatment have been discussed with the patient and family. After consideration of risks, benefits and other options for treatment, the patient has consented to  Procedure(s): IRRIGATION AND DEBRIDEMENT FOOT (Right) as a surgical intervention .  The patient's history has been reviewed, patient examined, no change in status, stable for surgery.  I have reviewed the patient's chart and labs.  Questions were answered to the patient's satisfaction.     Yolandra Habig W.

## 2015-04-03 NOTE — Consult Note (Signed)
[facility] Consult Note  MRN : [id number]  [name] is a 69 y.o. (01-23-1946) female who presents with chief complaint of No chief complaint on file. Marland Kitchen  History of Present Illness: Patient is admitted for nonhealing ulceration and infection in the right foot. She has had an ulceration now for about 2 months. She reports no improvement initially and she had to have a right great toe amputation performed by Dr. Vickki Muff about a month ago. This has not healed well and she is readmitted for wound debridement and IV antibiotics. She reports also having had IV antibiotics at home previously. She is a fair historian. She has pain in her foot and toes. She does not really describe claudication symptoms or ischemic rest pain. She does not currently have fever or chills or signs of systemic infection, although low-grade fever was present prior to admission. She has no pain or ulcerations on the left foot. Associated with her ulceration is significant swelling of the right leg which has not improved over several weeks. She had an ultrasound that assessed for DVT which was negative.  Current Facility-Administered Medications  Medication Dose Route Frequency Provider Last Rate Last Dose  . 0.9 %  sodium chloride infusion  250 mL Intravenous PRN Hillary Bow, MD      . acetaminophen (TYLENOL) tablet 650 mg  650 mg Oral Q6H PRN Hillary Bow, MD       Or  . acetaminophen (TYLENOL) suppository 650 mg  650 mg Rectal Q6H PRN Srikar Sudini, MD      . albuterol (PROVENTIL) (2.5 MG/3ML) 0.083% nebulizer solution 2.5 mg  2.5 mg Nebulization Q2H PRN Srikar Sudini, MD      . bisacodyl (DULCOLAX) suppository 10 mg  10 mg Rectal Daily PRN Srikar Sudini, MD      . cloNIDine (CATAPRES) tablet 0.1 mg  0.1 mg Oral TID Vaughan Basta, MD   0.1 mg at 04/03/15 1553  . docusate sodium (COLACE) capsule 100 mg  100 mg Oral BID Hillary Bow, MD   100 mg at 04/02/15 2110  . enoxaparin  (LOVENOX) injection 40 mg  40 mg Subcutaneous Q24H Crystal G Scarpena, RPH      . feeding supplement (GLUCERNA SHAKE) (GLUCERNA SHAKE) liquid 237 mL  237 mL Oral TID WC Vaughan Basta, MD   237 mL at 04/03/15 1200  . ferrous sulfate tablet 325 mg  325 mg Oral Daily Hillary Bow, MD   325 mg at 04/03/15 0714  . folic acid (FOLVITE) tablet 1 mg  1 mg Oral Daily Srikar Sudini, MD   1 mg at 04/03/15 [id number]  . furosemide (LASIX) tablet 40 mg  40 mg Oral BID Hillary Bow, MD   40 mg at 04/02/15 1739  . glimepiride (AMARYL) tablet 1 mg  1 mg Oral Daily Srikar Sudini, MD   1 mg at 04/03/15 [id number]  . hydrALAZINE (APRESOLINE) injection 10 mg  10 mg Intravenous Q6H PRN Vaughan Basta, MD   10 mg at 04/03/15 1145  . hydrALAZINE (APRESOLINE) tablet 100 mg  100 mg Oral TID Hillary Bow, MD   100 mg at 04/03/15 1553  . HYDROcodone-acetaminophen (NORCO/VICODIN) 5-325 MG per tablet 1-2 tablet  1-2 tablet Oral Q4H PRN Hillary Bow, MD   1 tablet at 04/03/15 1150  . isosorbide dinitrate (ISORDIL) tablet 30 mg  30 mg Oral BID Hillary Bow, MD   30 mg at 04/02/15 2109  . nitroGLYCERIN (NITROSTAT) SL tablet 0.4 mg  0.4  mg Sublingual Q5 min PRN Hillary Bow, MD      . ondansetron (ZOFRAN) tablet 4 mg  4 mg Oral Q6H PRN Hillary Bow, MD       Or  . ondansetron (ZOFRAN) injection 4 mg  4 mg Intravenous Q6H PRN Srikar Sudini, MD      . pantoprazole (PROTONIX) EC tablet 40 mg  40 mg Oral QAC breakfast Hillary Bow, MD   40 mg at 04/03/15 0713  . piperacillin-tazobactam (ZOSYN) IVPB 3.375 g  3.375 g Intravenous 3 times per day Ramond Dial, RPH   3.375 g at 04/03/15 1331  . polyethylene glycol (MIRALAX / GLYCOLAX) packet 17 g  17 g Oral Daily PRN Srikar Sudini, MD      . potassium chloride SA (K-DUR,KLOR-CON) CR tablet 20 mEq  20 mEq Oral Daily Hillary Bow, MD   20 mEq at 04/02/15 1739  . propranolol (INDERAL) tablet 40 mg  40 mg Oral TID Hillary Bow, MD   40 mg at 04/03/15 1552  . [START ON  04/04/2015] ramipril (ALTACE) capsule 10 mg  10 mg Oral Daily Vaughan Basta, MD      . simvastatin (ZOCOR) tablet 20 mg  20 mg Oral QHS Hillary Bow, MD   20 mg at 04/02/15 2111  . sodium chloride 0.9 % injection 3 mL  3 mL Intravenous Q12H Hillary Bow, MD   3 mL at 04/02/15 2112  . sodium chloride 0.9 % injection 3 mL  3 mL Intravenous PRN Srikar Sudini, MD      . vancomycin (VANCOCIN) IVPB 1000 mg/200 mL premix  1,000 mg Intravenous Q24H Ramond Dial, [place]        Past Medical History  Diagnosis Date  . Hypertension   . Diabetes mellitus without complication ([place])   . Anemia   . Coronary artery disease   . CHF (congestive heart failure) ([place])   . Glaucoma   . Hyperlipidemia   . Chronic kidney disease   . Neuropathy ([place])   . Chronic anemia   . Stroke ([place])   . Anginal pain ([place])   . Arthritis   . Retinopathy   . Cardiomyopathy ([place])   . Carotid artery stenosis   . Hoarseness, chronic   . GERD (gastroesophageal reflux disease)   . Hemangioma of liver   . Weight loss, unintentional     Past Surgical History  Procedure Laterality Date  . Cardiac catheterization    . Esophagogastroduodenoscopy (egd) with propofol N/A 09/24/2014    Elliott-gastritis & normal Billroth II changes   . Colonoscopy with propofol N/A 09/24/2014    Elliott-Incomplete secondary to prep  . Coronary angioplasty  2016    stent placed  . Bilroth ii procedure    . Colonoscopy with propofol N/A 11/12/2014    Procedure: COLONOSCOPY WITH PROPOFOL;  Surgeon: Manya Silvas, MD;  Location: [facility] ENDOSCOPY;  Service: Endoscopy;  Laterality: N/A;  Trudee Kuster hole Right 12/06/2014    Procedure: Right burr hole  for subdural hematoma;  Surgeon: Newman Pies, MD;  Location: [facility] NEURO ORS;  Service: Neurosurgery;  Laterality: Right;  . Irrigation and debridement foot Right 02/01/2015    Procedure: IRRIGATION AND DEBRIDEMENT FOOT/RIGHT GREAT TOE;  Surgeon: [name], DPM;  Location: [facility] ORS;  Service:  Podiatry;  Laterality: Right;  . Eye surgery Right     cataract removed  . Amputation toe Right 03/01/2015    Procedure: AMPUTATION  RIGHT GREAT TOE;  Surgeon: [name], DPM;  Location:  [facility] ORS;  Service: Podiatry;  Laterality: Right;    Social History Social History  Substance Use Topics  . Smoking status: Never Smoker   . Smokeless tobacco: Never Used     Comment: quit many years ago  . Alcohol Use: No  No IVDU  Family History Family History  Problem Relation Age of Onset  . Diabetes Mother   . Heart disease Father   . Diabetes Sister   . Lung cancer Brother   . Cancer Brother   . Diabetes Brother   . Diabetes Sister     Allergies  Allergen Reactions  . Codeine Other (See Comments)    Reaction:  Hallucinations     REVIEW OF SYSTEMS (Negative unless checked)  Constitutional: [] Weight loss  [] Fever  [] Chills Cardiac: [] Chest pain   [] Chest pressure   [] Palpitations   [] Shortness of breath when laying flat   [] Shortness of breath at rest   [x] Shortness of breath with exertion. Vascular:  [] Pain in legs with walking   [] Pain in legs at rest   [] Pain in legs when laying flat   [] Claudication   [] Pain in feet when walking  [] Pain in feet at rest  [] Pain in feet when laying flat   [] History of DVT   [] Phlebitis   [] Swelling in legs   [] Varicose veins   [x] Non-healing ulcers Pulmonary:   [] Uses home oxygen   [] Productive cough   [] Hemoptysis   [] Wheeze  [] COPD   [] Asthma Neurologic:  [] Dizziness  [] Blackouts   [] Seizures   [] History of stroke   [] History of TIA  [] Aphasia   [] Temporary blindness   [] Dysphagia   [] Weakness or numbness in arms   [] Weakness or numbness in legs Musculoskeletal:  [] Arthritis   [] Joint swelling   [] Joint pain   [] Low back pain Hematologic:  [] Easy bruising  [] Easy bleeding   [] Hypercoagulable state   [x] Anemic  [] Hepatitis Gastrointestinal:  [] Blood in stool   [] Vomiting blood  [] Gastroesophageal reflux/heartburn   [] Difficulty  swallowing. Genitourinary:  [x] Chronic kidney disease   [] Difficult urination  [] Frequent urination  [] Burning with urination   [] Blood in urine Skin:  [] Rashes   [x] Ulcers   [x] Wounds Psychological:  [] History of anxiety   []  History of major depression.  Physical Examination  Filed Vitals:   04/03/15 0814 04/03/15 1150 04/03/15 1256 04/03/15 1557  BP: 191/76 194/62 182/74 200/80  Pulse: 72   78  Temp:    99.1 F (37.3 C)  TempSrc:    Oral  Resp:      Height:      Weight:      SpO2:       Body mass index is 23.49 kg/(m^2). Gen:  WD/WN, NAD Head: [place]/AT, No temporalis wasting. Prominent temp pulse not noted. Ear/Nose/Throat: Hearing grossly intact, nares w/o erythema or drainage, oropharynx w/o Erythema/Exudate Eyes: PERRLA, EOMI.  Neck: Supple, no nuchal rigidity.  No bruit or JVD.  Pulmonary:  Good air movement, clear to auscultation bilaterally.  Cardiac: RRR, normal S1, S2, no Murmurs, rubs or gallops. Vascular:  Vessel Right Left  Radial Palpable Palpable  Ulnar Palpable Palpable  Brachial Palpable Palpable  Carotid Palpable, without bruit Palpable, without bruit  Aorta Not palpable N/A  Femoral Palpable Palpable  Popliteal Palpable Palpable  PT Palpable Palpable  DP Not Palpable Not Palpable   Gastrointestinal: soft, non-tender/non-distended. No guarding/reflex. No masses, surgical incisions, or scars. Musculoskeletal: M/S 5/5 throughout.  No deformity or atrophy. 2+ RLE edema. No LLE edema. Neurologic: CN 2-12  intact. Decreased sensation in the LE bilaterally.  Symmetrical.  Speech is fluent. Motor exam as listed above. Psychiatric: Judgment intact, Mood & affect appropriate for pt's clinical situation. Dermatologic: ulceration on the right great toe from previous amputation site.  Currently dressed.  No erythema spreading up the foot. Lymph : No Cervical, Axillary, adenopathy.  Inguinal adenopathy present on the right      CBC Lab Results  Component Value  Date   WBC 2.8* 04/02/2015   HGB 7.4* 04/02/2015   HCT 22.2* 04/02/2015   MCV 84.2 04/02/2015   PLT 259 04/02/2015    BMET    Component Value Date/Time   NA 138 04/02/2015 1350   NA 146* 05/05/2014 0108   K 4.1 04/02/2015 1350   K 3.4* 05/05/2014 0108   CL 114* 04/02/2015 1350   CL 118* 05/05/2014 0108   CO2 20* 04/02/2015 1350   CO2 21 05/05/2014 0108   GLUCOSE 137* 04/02/2015 1350   GLUCOSE 52* 05/05/2014 0108   BUN 33* 04/02/2015 1350   BUN 26* 05/05/2014 0108   CREATININE 1.25* 04/02/2015 1350   CREATININE 2.02* 05/05/2014 0108   CALCIUM 7.4* 04/02/2015 1350   CALCIUM 7.7* 05/05/2014 0108   GFRNONAA 43* 04/02/2015 1350   GFRNONAA 26* 05/05/2014 0108   GFRNONAA 28* 05/22/2013 0518   GFRAA 49* 04/02/2015 1350   GFRAA 31* 05/05/2014 0108   GFRAA 33* 05/22/2013 0518   Estimated Creatinine Clearance: 40.7 mL/min (by C-G formula based on Cr of 1.25).  COAG Lab Results  Component Value Date   INR 1.33 04/02/2015   INR 1.27 12/03/2014    Radiology US Venous Img Lower Unilateral Right  04/03/2015  CLINICAL DATA:  Right lower extremity swelling. EXAM: RIGHT LOWER EXTREMITY VENOUS DOPPLER ULTRASOUND TECHNIQUE: Gray-scale sonography with graded compression, as well as color Doppler and duplex ultrasound were performed to evaluate the lower extremity deep venous systems from the level of the common femoral vein and including the common femoral, femoral, profunda femoral, popliteal and calf veins including the posterior tibial, peroneal and gastrocnemius veins when visible. The superficial great saphenous vein was also interrogated. Spectral Doppler was utilized to evaluate flow at rest and with distal augmentation maneuvers in the common femoral, femoral and popliteal veins. COMPARISON:  MRI 01/31/2015.  ULTRASOUND 01/29/2015. FINDINGS: Contralateral Common Femoral Vein: Respiratory phasicity is normal and symmetric with the symptomatic side. No evidence of thrombus. Normal  compressibility. Common Femoral Vein: No evidence of thrombus. Normal compressibility, respiratory phasicity and response to augmentation. Saphenofemoral Junction: No evidence of thrombus. Normal compressibility and flow on color Doppler imaging. Profunda Femoral Vein: No evidence of thrombus. Normal compressibility and flow on color Doppler imaging. Femoral Vein: No evidence of thrombus. Normal compressibility, respiratory phasicity and response to augmentation. Popliteal Vein: No evidence of thrombus. Normal compressibility, respiratory phasicity and response to augmentation. Calf Veins: No evidence of thrombus. Normal compressibility and flow on color Doppler imaging. Superficial Great Saphenous Vein: No evidence of thrombus. Normal compressibility and flow on color Doppler imaging. Venous Reflux:  None. Other Findings: 5.0 x 1.1 x 4.3 cm complex cystic fluid collection in the right popliteal fossa consistent with Baker cyst. 2.8 x 0.4 x 0.9 cm density noted in the right inguinal region consistent with a lymph node. Similar findings noted on prior exam. IMPRESSION: No evidence of deep venous thrombosis. Electronically Signed   By: [place]   On: 04/03/2015 13:14     Assessment/Plan 1. Right lower extremity ulceration and infection with  nonhealing amputation site. The patient has a large number of atherosclerotic risk factors including hypertension, hyperlipidemia, and diabetes. Peripheral arterial disease as suspected based off these risk factors and clinical exam as well as her clinical course with a nonhealing ulceration over several weeks and persistent nonhealing despite appropriate surgical debridement. At this point, I would recommend angiography with possible revascularization. I have discussed the risks and benefits of the procedure. I have discussed that improved perfusion may be necessary to have any chance of getting her wound to heal, and without improved perfusion limb loss is  significant risk. She voices her understanding and is agreeable to proceed with angiography tomorrow. 2. Diabetes. Represents atherosclerotic risk factor and also worsens ability to heal wound. Optimize glucose control important. 3. Hypertension. Also represents atherosclerotic risk factor. Blood pressure control important. 4. History of stroke. 5. Coronary disease and history of heart failure. Currently stable without active angina. 6. Chronic kidney disease stage IV. Continue hydration and we will try to limit our contrast use as much as possible. Renal insufficiency is discussed as one of the risk factors from the procedure.   DEW,JASON, MD  04/03/2015 4:20 PM

## 2015-04-03 NOTE — Progress Notes (Signed)
[facility] INFECTIOUS DISEASE PROGRESS NOTE Date of Admission:  04/02/2015     ID: [name] is a 69 y.o. 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 Seen by podiatry and [facility]  ROS  Eleven systems are reviewed and negative except per hpi  Medications:  Antibiotics Given (last 72 hours)    Date/Time Action Medication Dose Rate   04/02/15 2021 Given   vancomycin (VANCOCIN) 1,500 mg in sodium chloride 0.9 % 500 mL IVPB 1,500 mg 250 mL/hr   04/02/15 2052 Given   piperacillin-tazobactam (ZOSYN) IVPB 3.375 g 3.375 g 12.5 mL/hr   04/03/15 0411 Given   piperacillin-tazobactam (ZOSYN) IVPB 3.375 g 3.375 g 12.5 mL/hr   04/03/15 1331 Given   piperacillin-tazobactam (ZOSYN) IVPB 3.375 g 3.375 g 12.5 mL/hr     . cloNIDine  0.1 mg Oral TID  . docusate sodium  100 mg Oral BID  . enoxaparin (LOVENOX) injection  40 mg Subcutaneous Q24H  . feeding supplement (GLUCERNA SHAKE)  237 mL Oral TID WC  . ferrous sulfate  325 mg Oral Daily  . folic acid  1 mg Oral Daily  . furosemide  40 mg Oral BID  . glimepiride  1 mg Oral Daily  . hydrALAZINE  100 mg Oral TID  . isosorbide dinitrate  30 mg Oral BID  . pantoprazole  40 mg Oral QAC breakfast  . piperacillin-tazobactam (ZOSYN)  IV  3.375 g Intravenous 3 times per day  . potassium chloride SA  20 mEq Oral Daily  . propranolol  40 mg Oral TID  . [START ON 04/04/2015] ramipril  10 mg Oral Daily  . simvastatin  20 mg Oral QHS  . sodium chloride  3 mL Intravenous Q12H  . vancomycin  1,000 mg Intravenous Q24H    Objective: Vital signs in last 24 hours: Temp:  [97.8 F (36.6 C)-98.8 F (37.1 C)] 98.8 F (37.1 C) (11/30 0812) Pulse Rate:  [72-84] 72 (11/30 0814) Resp:  [17-18] 18 (11/30 0812) BP: (162-201)/(59-78) 182/74 mmHg (11/30 1256) SpO2:  [96 %-100 %] 96 % (11/30  0812) Weight:  [68.04 kg (150 lb)] 68.04 kg (150 lb) (11/30 0500) Constitutional: oriented to person, place, and time. appears well-developed and well-nourished. No distress.  HENT: [place] /AT, PERRLA, no scleral icterus Mouth/Throat: Oropharynx is clear and moist. No oropharyngeal exudate.  Cardiovascular: Normal rate, regular rhythm and normal heart sounds. Pulmonary/Chest: Effort normal and breath sounds normal. No respiratory distress. has no wheezes.  Abdominal: Soft. Bowel sounds are normal. exhibits no distension. There is no tenderness.  Lymphadenopathy: no cervical adenopathy. No axillary adenopathy Neurological: alert and oriented to person, place, and time.  Skin: R foot with great toe amputation site well appox with suture sin place but able to express ss drainge- mod amt R medial over MT head has a necrotic appearing ulcer with ss drainage  Lab Results  Recent Labs  04/02/15 1350 04/02/15 2230  WBC 2.8*  --   HGB 6.9* 7.4*  HCT 21.4* 22.2*  NA 138  --   K 4.1  --   CL 114*  --   CO2 20*  --   BUN 33*  --   CREATININE 1.25*  --     Microbiology:  Results for orders placed or performed during the hospital encounter of 04/02/15  Wound  culture     Status: None (Preliminary result)   Collection Time: 04/02/15  1:50 AM  Result Value Ref Range Status   Specimen Description ULCER  Final   Special Requests Normal  Final   Gram Stain   Final    MODERATE WBC SEEN RARE GRAM POSITIVE COCCI IN PAIRS RARE GRAM VARIABLE ROD    Culture   Final    LIGHT GROWTH GRAM NEGATIVE RODS IDENTIFICATION AND SUSCEPTIBILITIES TO FOLLOW    Report Status PENDING  Incomplete  Surgical pcr screen     Status: None   Collection Time: 04/02/15 12:34 PM  Result Value Ref Range Status   MRSA, PCR NEGATIVE NEGATIVE Final   Staphylococcus aureus NEGATIVE NEGATIVE Final    Comment:        The Xpert SA Assay (FDA approved for NASAL specimens in patients over 65 years of age), is one  component of a comprehensive surveillance program.  Test performance has been validated by [facility] for patients greater than or equal to 44 year old. It is not intended to diagnose infection nor to guide or monitor treatment.   Culture, blood (routine x 2)     Status: None (Preliminary result)   Collection Time: 04/02/15  1:50 PM  Result Value Ref Range Status   Specimen Description BLOOD RIGHT HAND  Final   Special Requests BOTTLES DRAWN AEROBIC AND ANAEROBIC 4CC  Final   Culture NO GROWTH < 24 HOURS  Final   Report Status PENDING  Incomplete  Culture, blood (routine x 2)     Status: None (Preliminary result)   Collection Time: 04/02/15  2:08 PM  Result Value Ref Range Status   Specimen Description BLOOD LEFT ASSIST CONTROL  Final   Special Requests BOTTLES DRAWN AEROBIC AND ANAEROBIC 4CC  Final   Culture NO GROWTH < 24 HOURS  Final   Report Status PENDING  Incomplete   03/01/15    62moago    Specimen Description TOE   Special Requests NONE   Gram Stain FEW WBC SEEN  FEW YEAST  FEW GRAM NEGATIVE RODS  RARE GRAM POSITIVE RODS       Culture MODERATE GROWTH STENOTROPHOMONAS [place]  Report Status 03/04/2015 FINAL   Organism ID, Bacteria STENOTROPHOMONAS MALTOPHILIA   Resulting Agency [facility]    Culture & Susceptibility      STENOTROPHOMONAS MALTOPHILIA     Antibiotic Sensitivity Microscan Status    LEVOFLOXACIN Sensitive 0.5 SENSITIVE Final    Method: MIC    TRIMETH/SULFA Sensitive <=20 SENSITIVE Final    Method: MIC          Culture HEAVY GROWTH ESCHERICHIA COLI  HEAVY GROWTH PROVIDENCIA STUARTII  MODERATE GROWTH MORGANELLA MORGANII  MODERATE GROWTH ENTEROCOCCUS FAECALIS  Susceptibility Pattern Suggests Possibility of an Extended Spectrum Beta Lactamase Producer. Contact Laboratory Within 7 Days if Confirmation Warranted.       Report Status PENDING    Organism ID, Bacteria ESCHERICHIA COLI   Organism ID, Bacteria PROVIDENCIA STUARTII   Organism ID, Bacteria MORGANELLA MORGANII   Organism ID, Bacteria ENTEROCOCCUS FAECALIS   Resulting Agency [facility]    Culture & Susceptibility      PROVIDENCIA STUARTII     Antibiotic Sensitivity Microscan Status    AMPICILLIN Resistant >=32 RESISTANT Final    Method: MIC    CEFAZOLIN Resistant >=64 RESISTANT Final    Method: MIC    CEFTAZIDIME Sensitive <=1 SENSITIVE Final    Method: MIC    CEFTRIAXONE Sensitive <=1  SENSITIVE Final    Method: MIC    CIPROFLOXACIN Resistant >=4 RESISTANT Final    Method: MIC    GENTAMICIN Resistant 2 RESISTANT Final    Method: MIC    IMIPENEM Sensitive 2 SENSITIVE Final    Method: MIC    TRIMETH/SULFA Resistant >=320 RESISTANT Final    Method: MIC    Comments PROVIDENCIA STUARTII (MIC)    HEAVY GROWTH PROVIDENCIA STUARTII             ESCHERICHIA COLI     Antibiotic Sensitivity Microscan Status    AMPICILLIN Resistant >=32 RESISTANT Final    Method: MIC    CEFAZOLIN Resistant >=64 RESISTANT Final    Method: MIC    CEFTAZIDIME Resistant 16 RESISTANT Final    Method: MIC    CEFTRIAXONE Resistant >=64 RESISTANT Final    Method: MIC    CIPROFLOXACIN Resistant >=4 RESISTANT Final    Method: MIC    Extended ESBL Resistant POSITIVE Final    Method: MIC    GENTAMICIN Sensitive <=1 SENSITIVE Final    Method: MIC    IMIPENEM Sensitive <=0.25 SENSITIVE Final    Method: MIC    TRIMETH/SULFA Resistant >=320 RESISTANT Final    Method: MIC    Comments ESCHERICHIA COLI (MIC)    HEAVY GROWTH ESCHERICHIA COLI             MORGANELLA  MORGANII     Antibiotic Sensitivity Microscan Status    AMPICILLIN Resistant RESISTANT Final    Method: MIC    CEFAZOLIN Resistant RESISTANT Final    Method: MIC    CEFEPIME Sensitive SENSITIVE Final    Method: MIC    CEFTAZIDIME Resistant RESISTANT Final    Method: MIC    CEFTRIAXONE Sensitive SENSITIVE Final    Method: MIC    CIPROFLOXACIN Resistant RESISTANT Final    Method: MIC    GENTAMICIN Resistant RESISTANT Final    Method: MIC    IMIPENEM Sensitive SENSITIVE Final    Method: MIC    TRIMETH/SULFA Resistant RESISTANT Final    Method: MIC    Comments MORGANELLA MORGANII (MIC)    MODERATE GROWTH MORGANELLA MORGANII             ENTEROCOCCUS FAECALIS     Antibiotic Sensitivity Microscan Status    AMPICILLIN Sensitive SENSITIVE Final    Method: MIC    LINEZOLID Sensitive SENSITIVE Final    Method: MIC    Comments ENTEROCOCCUS FAECALIS (MIC)    MODERATE GROWTH ENTEROCOCCUS FAECALIS                   Studies/Results: US Venous Img Lower Unilateral Right  04/03/2015  CLINICAL DATA:  Right lower extremity swelling. EXAM: RIGHT LOWER EXTREMITY VENOUS DOPPLER ULTRASOUND TECHNIQUE: Gray-scale sonography with graded compression, as well as color Doppler and duplex ultrasound were performed to evaluate the lower extremity deep venous systems from the level of the common femoral vein and including the common femoral, femoral, profunda femoral, popliteal and calf veins including the posterior tibial, peroneal and gastrocnemius veins when visible. The superficial great saphenous vein was also interrogated. Spectral Doppler was utilized to evaluate flow at rest and with distal augmentation maneuvers in the common femoral, femoral and popliteal veins. COMPARISON:  MRI  01/31/2015.  ULTRASOUND 01/29/2015. FINDINGS: Contralateral Common Femoral Vein: Respiratory phasicity is normal and symmetric with the symptomatic side. No evidence of thrombus. Normal compressibility. Common Femoral Vein: No evidence of thrombus. Normal compressibility, respiratory phasicity and response to  augmentation. Saphenofemoral Junction: No evidence of thrombus. Normal compressibility and flow on color Doppler imaging. Profunda Femoral Vein: No evidence of thrombus. Normal compressibility and flow on color Doppler imaging. Femoral Vein: No evidence of thrombus. Normal compressibility, respiratory phasicity and response to augmentation. Popliteal Vein: No evidence of thrombus. Normal compressibility, respiratory phasicity and response to augmentation. Calf Veins: No evidence of thrombus. Normal compressibility and flow on color Doppler imaging. Superficial Great Saphenous Vein: No evidence of thrombus. Normal compressibility and flow on color Doppler imaging. Venous Reflux:  None. Other Findings: 5.0 x 1.1 x 4.3 cm complex cystic fluid collection in the right popliteal fossa consistent with Baker cyst. 2.8 x 0.4 x 0.9 cm density noted in the right inguinal region consistent with a lymph node. Similar findings noted on prior exam. IMPRESSION: No evidence of deep venous thrombosis. Electronically Signed   By: Marcello Moores  Register   On: 04/03/2015 13:14   X-ray: 04/02/15 Right foot x-rays are questionable for osteomyelitis of the 1st metatarsal head. No obvious erosive changes but there is concern especially on the oblique view. She does have noted diffuse osteopenia.  Assessment:  DYNASTI KERMAN is a 69 y.o. female with recent amputation of R great toe 10/28 now with new medial ulcer and possible underlying osteomyelitis. She had abundant thin ss drainage from the site of amputation and the medial wound. HIV neg, ESR 37, alb very low at 1.7/  CRP pending.  Wound cx pending  Recommendations Cont vanco  and zosyn. - will need PICC and likely 4 weeks IV abx after surgery For debridement 11/20 and angiogram 12/1 Place picc if possible 12/1  Thank you very much for the consult. Will follow with you.  [place], [place]   04/03/2015, 3:54 PM

## 2015-04-03 NOTE — H&P (View-Only) (Signed)
Reason for Consult: Nonhealing surgical wound right foot with recent development of osteomyelitis in the first metatarsal Referring Physician: [facility]  [name] is an 69 y.o. female.  HPI: The patient has a couple month history of ulceration and infection in her right great toe. Previously has had a debridement of the area which was subsequently followed later by an amputation of the right great toe. This was performed by Dr. Vickki Muff. Continued to have nonhealing of the wound and the incision was cleaned out and then resutured primarily. Recently over the last couple of weeks has developed a new ulceration on the side of her first metatarsal. It has deepened down to the level of exposed bone where some cartilaginous fragments were taken out in the office today. Serial x-rays revealed destruction of the first metatarsal head consistent with osteomyelitis and the patient was admitted for IV antibiotics and re-debridement.  Past Medical History  Diagnosis Date  . Hypertension   . Diabetes mellitus without complication ([place])   . Anemia   . Coronary artery disease   . CHF (congestive heart failure) ([place])   . Glaucoma   . Hyperlipidemia   . Chronic kidney disease   . Neuropathy ([place])   . Chronic anemia   . Stroke ([place])   . Anginal pain ([place])   . Arthritis   . Retinopathy   . Cardiomyopathy ([place])   . Carotid artery stenosis   . Hoarseness, chronic   . GERD (gastroesophageal reflux disease)   . Hemangioma of liver   . Weight loss, unintentional     Past Surgical History  Procedure Laterality Date  . Cardiac catheterization    . Esophagogastroduodenoscopy (egd) with propofol N/A 09/24/2014    Elliott-gastritis & normal Billroth II changes   . Colonoscopy with propofol N/A 09/24/2014    Elliott-Incomplete secondary to prep  . Coronary angioplasty  2016    stent placed  . Bilroth ii procedure    . Colonoscopy with propofol N/A 11/12/2014    Procedure: COLONOSCOPY  WITH PROPOFOL;  Surgeon: Manya Silvas, MD;  Location: [facility] ENDOSCOPY;  Service: Endoscopy;  Laterality: N/A;  Trudee Kuster hole Right 12/06/2014    Procedure: Right burr hole  for subdural hematoma;  Surgeon: Newman Pies, MD;  Location: [facility] NEURO ORS;  Service: Neurosurgery;  Laterality: Right;  . Irrigation and debridement foot Right 02/01/2015    Procedure: IRRIGATION AND DEBRIDEMENT FOOT/RIGHT GREAT TOE;  Surgeon: [name], DPM;  Location: [facility] ORS;  Service: Podiatry;  Laterality: Right;  . Eye surgery Right     cataract removed  . Amputation toe Right 03/01/2015    Procedure: AMPUTATION  RIGHT GREAT TOE;  Surgeon: [name], DPM;  Location: [facility] ORS;  Service: Podiatry;  Laterality: Right;    Family History  Problem Relation Age of Onset  . Diabetes Mother   . Heart disease Father   . Diabetes Sister   . Lung cancer Brother   . Cancer Brother   . Diabetes Brother   . Diabetes Sister     Social History:  reports that she has never smoked. She has never used smokeless tobacco. She reports that she does not drink alcohol or use illicit drugs.  Allergies:  Allergies  Allergen Reactions  . Codeine Other (See Comments)    Reaction:  Hallucinations    Medications: I have reviewed the patient's current medications.  Results for orders placed or performed during the hospital encounter of 04/02/15 (from the past 48  hour(s))  Surgical pcr screen     Status: None   Collection Time: 04/02/15 12:34 PM  Result Value Ref Range   MRSA, PCR NEGATIVE NEGATIVE   Staphylococcus aureus NEGATIVE NEGATIVE    Comment:        The Xpert SA Assay (FDA approved for NASAL specimens in patients over 62 years of age), is one component of a comprehensive surveillance program.  Test performance has been validated by [facility] for patients greater than or equal to 72 year old. It is not intended to diagnose infection nor to guide or monitor treatment.   Comprehensive metabolic panel      Status: Abnormal   Collection Time: 04/02/15  1:50 PM  Result Value Ref Range   Sodium 138 135 - 145 mmol/L   Potassium 4.1 3.5 - 5.1 mmol/L   Chloride 114 (H) 101 - 111 mmol/L   CO2 20 (L) 22 - 32 mmol/L   Glucose, Bld 137 (H) 65 - 99 mg/dL   BUN 33 (H) 6 - 20 mg/dL   Creatinine, Ser 1.25 (H) 0.44 - 1.00 mg/dL   Calcium 7.4 (L) 8.9 - 10.3 mg/dL   Total Protein 5.1 (L) 6.5 - 8.1 g/dL   Albumin 1.7 (L) 3.5 - 5.0 g/dL   AST 24 15 - 41 U/L   ALT 9 (L) 14 - 54 U/L   Alkaline Phosphatase 68 38 - 126 U/L   Total Bilirubin <0.1 (L) 0.3 - 1.2 mg/dL   GFR calc non Af Amer 43 (L) >60 mL/min   GFR calc Af Amer 49 (L) >60 mL/min    Comment: (NOTE) The eGFR has been calculated using the CKD EPI equation. This calculation has not been validated in all clinical situations. eGFR's persistently <60 mL/min signify possible Chronic Kidney Disease.    Anion gap 4 (L) 5 - 15  CBC WITH DIFFERENTIAL     Status: Abnormal   Collection Time: 04/02/15  1:50 PM  Result Value Ref Range   WBC 2.8 (L) 3.6 - 11.0 K/uL   RBC 2.54 (L) 3.80 - 5.20 MIL/uL   Hemoglobin 6.9 (L) 12.0 - 16.0 g/dL   HCT 21.4 (L) 35.0 - 47.0 %   MCV 84.2 80.0 - 100.0 fL   MCH 27.1 26.0 - 34.0 pg   MCHC 32.2 32.0 - 36.0 g/dL   RDW 16.2 (H) 11.5 - 14.5 %   Platelets 259 150 - 440 K/uL   Neutrophils Relative % 64 %   Neutro Abs 1.8 1.4 - 6.5 K/uL   Lymphocytes Relative 25 %   Lymphs Abs 0.7 (L) 1.0 - 3.6 K/uL   Monocytes Relative 4 %   Monocytes Absolute 0.1 (L) 0.2 - 0.9 K/uL   Eosinophils Relative 7 %   Eosinophils Absolute 0.2 0 - 0.7 K/uL   Basophils Relative 0 %   Basophils Absolute 0.0 0 - 0.1 K/uL  Protime-INR     Status: Abnormal   Collection Time: 04/02/15  1:50 PM  Result Value Ref Range   Prothrombin Time 16.6 (H) 11.4 - 15.0 seconds   INR 1.33   APTT     Status: None   Collection Time: 04/02/15  1:50 PM  Result Value Ref Range   aPTT 34 24 - 36 seconds  Rapid HIV screen (HIV 1/2 Ab+Ag)     Status: None    Collection Time: 04/02/15  1:50 PM  Result Value Ref Range   HIV-1 P24 Antigen - HIV24 NON REACTIVE NON REACTIVE  HIV 1/2 Antibodies NON REACTIVE NON REACTIVE   Interpretation (HIV Ag Ab)      A non reactive test result means that HIV 1 or HIV 2 antibodies and HIV 1 p24 antigen were not detected in the specimen.    No results found.  Review of Systems  Constitutional: Negative.   HENT: Negative.   Eyes: Negative.   Respiratory: Negative.   Cardiovascular: Negative.   Gastrointestinal: Negative.   Genitourinary: Negative.   Musculoskeletal: Negative.   Skin:       Open draining wound on her right foot after previous amputation site. Swelling in her right leg as compared to the left.  Neurological:       She does relate significant neuropathy in her feet related to her diabetes  Endo/Heme/Allergies: Negative.   Psychiatric/Behavioral: Negative.    Blood pressure 184/63, pulse 84, temperature 98.9 F (37.2 C), temperature source Oral, resp. rate 16, height _0  (1.702 m), weight 67.405 kg (148 lb 9.6 oz), SpO2 100 %. Physical Exam  Cardiovascular:  DP pulses palpable bilateral. PT pulse is trace bilateral and thready on the right  Musculoskeletal:  Previous amputation of the right great toe. Some hammertoe contractures are noted on the lesser toes. Muscle testing is deferred  Neurological:  Loss of protective threshold with a monofilament wire bilateral. Proprioception is impaired  Skin:  The skin is warm dry and somewhat atrophic. Absent hair growth. A full-thickness wound is present on the medial aspect of the right first metatarsal area. Fibrotic and some necrotic deep tissue is noted along the medial eminence. Probes directly down to the level of bone. The distal incision from the amputation site is incompletely healed at this point. Also noted is a full-thickness ulceration with exposed tendon on the dorsal aspect of the right second toe. Edema is noted in the second toe.    X-rays: Multiple views of the right foot taken in the office today reveal destruction of the first metatarsal head with some fragmentation noted as compared to previous x-rays. Previous amputation of the hallux. Also noted is a fracture through the second metatarsal head with some mild impaction and shortening. No clear evidence of erosive changes of the proximal phalanx on the second toe. Also noted was significant small and medium vessel calcification throughout the right foot  Assessment/Plan: Assessment: Osteomyelitis right first metatarsal. Full-thickness ulceration with exposed tendon right second toe. Fracture right second metatarsal. Diabetes with associated neuropathy.  Plan: Discussed with the patient the need for re-debridement of the first metatarsal. Also discussed amputating the second toe with a partial ray resection due to the pathologic fracture which may at this point be infected or most likely would be in the future if not addressed. Due to the nonhealing nature of the wounds over the last couple of months I would recommend and we will obtain a vascular consult for evaluation. We will obtain a consent form for debridement of bone right first metatarsal and second ray resection. Nothing by mouth after breakfast tomorrow. At this point we'll plan for surgery tomorrow evening.  Vicenta Olds W. 04/02/2015, 3:19 PM

## 2015-04-03 NOTE — Progress Notes (Signed)
pts bp 194/62.  Pt given hydraulizine 10mg  iv. Pt taken to [place] . Will recheck bp upon return to unit.

## 2015-04-03 NOTE — Progress Notes (Signed)
Initial Nutrition Assessment     INTERVENTION:  Meals and snacks: Cater to pt preferences, once back on po diet Medical Nutrition Supplement Therapy: Recommend glucerna TID for added nutrition once back on po diet   NUTRITION DIAGNOSIS:   Inadequate oral intake related to acute illness as evidenced by NPO status.    GOAL:   Patient will meet greater than or equal to 90% of their needs    MONITOR:    (Energy intake, Glucose profile)  REASON FOR ASSESSMENT:   Consult Assessment of nutrition requirement/status  ASSESSMENT:      Pt admitted with nonhealing right foot wound, planning debridement  Past Medical History  Diagnosis Date  . Hypertension   . Diabetes mellitus without complication ([place])   . Anemia   . Coronary artery disease   . CHF (congestive heart failure) ([place])   . Glaucoma   . Hyperlipidemia   . Chronic kidney disease   . Neuropathy ([place])   . Chronic anemia   . Stroke ([place])   . Anginal pain ([place])   . Arthritis   . Retinopathy   . Cardiomyopathy ([place])   . Carotid artery stenosis   . Hoarseness, chronic   . GERD (gastroesophageal reflux disease)   . Hemangioma of liver   . Weight loss, unintentional     Current Nutrition: NPO  Food/Nutrition-Related History: typically eats bacon and eggs for breakfast, sandwich for lunch and meat and vegetables for dinner per pt.  Also drinks oral supplement at least 1 per day   Scheduled Medications:  . cloNIDine  0.1 mg Oral TID  . docusate sodium  100 mg Oral BID  . enoxaparin (LOVENOX) injection  30 mg Subcutaneous Q24H  . ferrous sulfate  325 mg Oral Daily  . folic acid  1 mg Oral Daily  . furosemide  40 mg Oral BID  . glimepiride  1 mg Oral Daily  . hydrALAZINE  100 mg Oral TID  . isosorbide dinitrate  30 mg Oral BID  . pantoprazole  40 mg Oral QAC breakfast  . piperacillin-tazobactam (ZOSYN)  IV  3.375 g Intravenous 3 times per day  . potassium chloride SA  20 mEq Oral Daily  . propranolol   40 mg Oral TID  . [START ON 04/04/2015] ramipril  10 mg Oral Daily  . simvastatin  20 mg Oral QHS  . sodium chloride  3 mL Intravenous Q12H  . vancomycin  1,000 mg Intravenous Q24H       Electrolyte/Renal Profile and Glucose Profile:   Recent Labs Lab 04/02/15 1350  NA 138  K 4.1  CL 114*  CO2 20*  BUN 33*  CREATININE 1.25*  CALCIUM 7.4*  GLUCOSE 137*   Protein Profile:   Recent Labs Lab 04/02/15 1350  ALBUMIN 1.7*    Gastrointestinal Profile: Last BM:11/28   Nutrition-Focused Physical Exam Findings: Nutrition-Focused physical exam completed. Findings are no fat depletion, all areas normal except mild/moderaet in temple region muscle depletion, and moderate edema.      Weight Change: pt reports wt loss from 200 pounds 2 months ago.  Noted wt encounters from 11/02/14 wt gain of 4 pounds, no wt loss noted    Diet Order:  Diet NPO time specified  Skin:   pressure ulcer stage II noted on coccyx, stage III on sacrum  Last BM:   11/28  Height:   Ht Readings from Last 1 Encounters:  04/02/15 5\' 7"  (1.702 m)    Weight:   Wt Readings  from Last 1 Encounters:  04/03/15 150 lb (68.04 kg)    Ideal Body Weight:     BMI:  Body mass index is 23.49 kg/(m^2).  Estimated Nutritional Needs:   Kcal:  BEE 1398 kcals (IF 1.0-1.2, AF 1.3) 1817-2155ml/d  Protein:  (1.0-1.2 g/kg) 68-82 g/d  Fluid:  (25-55ml/kg) 1700-2090ml/d  EDUCATION NEEDS:   No education needs identified at this time  [place]. Zenia Resides, [place], [place] (pager)

## 2015-04-03 NOTE — Progress Notes (Signed)
  Anticoagulation Monitoring:  Patient is a 68 yo female with orders for Lovenox 30 mg subq daily.  Patient weighs 68 kg and est CrCl~40.7 mL/min.  Per anticoagulation policy, will transition patient to Lovenox 40 mg subq q24h as CrCl > 30 mL/min.   Pharmacy will continue to follow.  Murrell Converse, PharmD Clinical Pharmacist 04/03/2015

## 2015-04-03 NOTE — Plan of Care (Signed)
Problem: Safety: Goal: Ability to remain free from injury will improve Outcome: Progressing Using call bell able to make needs known. Standby assist to bedside commode.  Problem: Pain Managment: Goal: General experience of comfort will improve Outcome: Progressing No complaints of pain this shift.

## 2015-04-03 NOTE — Anesthesia Postprocedure Evaluation (Signed)
Anesthesia Post Note  Patient: CHANNA STINNER  Procedure(s) Performed: Procedure(s) (LRB): IRRIGATION AND DEBRIDEMENT FOOT (Right)  Patient location during evaluation: PACU Anesthesia Type: General Level of consciousness: awake and alert Pain management: pain level controlled Vital Signs Assessment: post-procedure vital signs reviewed and stable Respiratory status: spontaneous breathing, nonlabored ventilation, respiratory function stable and patient connected to nasal cannula oxygen Cardiovascular status: blood pressure returned to baseline and stable Postop Assessment: no signs of nausea or vomiting Anesthetic complications: no    Last Vitals:  Filed Vitals:   04/03/15 1853 04/03/15 1908  BP: 148/55 167/54  Pulse: 63 64  Temp:  36.9 C  Resp: 22 16    Last Pain:  Filed Vitals:   04/03/15 1910  PainSc: 0-No pain                 Precious Haws Manaia Samad

## 2015-04-04 ENCOUNTER — Encounter
Admission: AD | Disposition: A | Discharge status: Home Health | Payer: Self-pay | Source: Ambulatory Visit | Attending: Internal Medicine

## 2015-04-04 HISTORY — PX: PERIPHERAL VASCULAR CATHETERIZATION: SHX172C

## 2015-04-04 LAB — GLUCOSE, CAPILLARY: GLUCOSE-CAPILLARY: 73 mg/dL (ref 65–99)

## 2015-04-04 LAB — CREATININE, SERUM
CREATININE: 1.3 mg/dL — AB (ref 0.44–1.00)
GFR calc Af Amer: 47 mL/min — ABNORMAL LOW (ref >60)
GFR calc non Af Amer: 41 mL/min — ABNORMAL LOW (ref >60)

## 2015-04-04 SURGERY — ABDOMINAL AORTOGRAM W/LOWER EXTREMITY
Wound class: Clean

## 2015-04-04 MED ORDER — MIDAZOLAM HCL 5 MG/5ML IJ SOLN
INTRAMUSCULAR | Status: AC
  Filled 2015-04-04: qty 5

## 2015-04-04 MED ORDER — DEXTROSE 5 % IV SOLN
1.0000 g | Freq: Once | INTRAVENOUS | Status: AC
  Administered 2015-04-04: 1 g via INTRAVENOUS

## 2015-04-04 MED ORDER — LABETALOL HCL 5 MG/ML IV SOLN
INTRAVENOUS | Status: AC
  Filled 2015-04-04: qty 4

## 2015-04-04 MED ORDER — HEPARIN SODIUM (PORCINE) 1000 UNIT/ML IJ SOLN
INTRAMUSCULAR | Status: AC
  Filled 2015-04-04: qty 1

## 2015-04-04 MED ORDER — IOHEXOL 300 MG/ML  SOLN
INTRAMUSCULAR | Status: DC | PRN
  Administered 2015-04-04: 70 mL via INTRA_ARTERIAL

## 2015-04-04 MED ORDER — HEPARIN (PORCINE) IN NACL 2-0.9 UNIT/ML-% IJ SOLN
INTRAMUSCULAR | Status: AC
  Filled 2015-04-04: qty 1000

## 2015-04-04 MED ORDER — CEFTAZIDIME 1 G IJ SOLR
1.0000 g | Freq: Two times a day (BID) | INTRAMUSCULAR | Status: DC
  Administered 2015-04-04 – 2015-04-08 (×10): 1 g via INTRAVENOUS
  Filled 2015-04-04 (×11): qty 1

## 2015-04-04 MED ORDER — LIDOCAINE-EPINEPHRINE (PF) 1 %-1:200000 IJ SOLN
INTRAMUSCULAR | Status: DC | PRN
  Administered 2015-04-04: 10 mL via INTRADERMAL

## 2015-04-04 MED ORDER — HEPARIN SODIUM (PORCINE) 1000 UNIT/ML IJ SOLN
INTRAMUSCULAR | Status: DC | PRN
  Administered 2015-04-04: 4000 [IU] via INTRAVENOUS

## 2015-04-04 MED ORDER — FENTANYL CITRATE (PF) 100 MCG/2ML IJ SOLN
INTRAMUSCULAR | Status: AC
  Filled 2015-04-04: qty 2

## 2015-04-04 MED ORDER — FENTANYL CITRATE (PF) 100 MCG/2ML IJ SOLN
INTRAMUSCULAR | Status: DC | PRN
  Administered 2015-04-04: 50 ug via INTRAVENOUS

## 2015-04-04 MED ORDER — MIDAZOLAM HCL 2 MG/2ML IJ SOLN
INTRAMUSCULAR | Status: DC | PRN
  Administered 2015-04-04: 2 mg via INTRAVENOUS

## 2015-04-04 MED ORDER — CHLORHEXIDINE GLUCONATE CLOTH 2 % EX PADS: 6.0000 | MEDICATED_PAD | Freq: Once | CUTANEOUS | Status: DC

## 2015-04-04 MED ORDER — LIDOCAINE-EPINEPHRINE (PF) 1 %-1:200000 IJ SOLN
INTRAMUSCULAR | Status: AC
  Filled 2015-04-04: qty 30

## 2015-04-04 MED ORDER — SODIUM CHLORIDE 0.9 % IV SOLN
INTRAVENOUS | Status: DC
  Administered 2015-04-04: 14:00:00 via INTRAVENOUS

## 2015-04-04 MED ORDER — SENNOSIDES-DOCUSATE SODIUM 8.6-50 MG PO TABS
1.0000 | ORAL_TABLET | Freq: Two times a day (BID) | ORAL | Status: DC
  Administered 2015-04-04 – 2015-04-08 (×8): 1 via ORAL
  Filled 2015-04-04 (×8): qty 1

## 2015-04-04 MED ORDER — SODIUM BICARBONATE BOLUS VIA INFUSION
INTRAVENOUS | Status: AC
  Administered 2015-04-04: 15:00:00 via INTRAVENOUS
  Filled 2015-04-04: qty 1

## 2015-04-04 MED ORDER — MINOCYCLINE HCL 100 MG PO CAPS
100.0000 mg | ORAL_CAPSULE | Freq: Two times a day (BID) | ORAL | Status: DC
  Administered 2015-04-04 – 2015-04-08 (×8): 100 mg via ORAL
  Filled 2015-04-04 (×9): qty 1

## 2015-04-04 MED ORDER — LABETALOL HCL 5 MG/ML IV SOLN
20.0000 mg | Freq: Once | INTRAVENOUS | Status: AC
  Administered 2015-04-04: 20 mg via INTRAVENOUS

## 2015-04-04 MED ORDER — SODIUM BICARBONATE 8.4 % IV SOLN
INTRAVENOUS | Status: AC
  Administered 2015-04-04: 16:00:00 via INTRAVENOUS
  Filled 2015-04-04: qty 1000

## 2015-04-04 SURGICAL SUPPLY — 19 items
BALLN ARMADA 3.0X120X150 (BALLOONS) ×4
BALLN ARMADA 3X120X150 (BALLOONS) ×3
BALLOON ARMADA 3X120X150 (BALLOONS) ×1 IMPLANT
CATH CXI SUPP ANG 4FR 135 (MICROCATHETER) ×1 IMPLANT
CATH CXI SUPP ANG 4FR 135CM (MICROCATHETER) ×4
CATH CXI SUPP ST 2.6FR 150CM (MICROCATHETER) ×2 IMPLANT
CATH PIG 70CM (CATHETERS) ×4 IMPLANT
CATH VERT 100CM (CATHETERS) ×2 IMPLANT
DEVICE PRESTO INFLATION (MISCELLANEOUS) ×2 IMPLANT
DEVICE STARCLOSE SE CLOSURE (Vascular Products) ×2 IMPLANT
GLIDEWIRE ADV .035X260CM (WIRE) ×2 IMPLANT
GUIDEWIRE PFTE-COATED .018X300 (WIRE) ×2 IMPLANT
PACK ANGIOGRAPHY (CUSTOM PROCEDURE TRAY) ×4 IMPLANT
SHEATH ANL2 6FRX45 HC (SHEATH) ×2 IMPLANT
SHEATH BRITE TIP 5FRX11 (SHEATH) ×4 IMPLANT
SYR MEDRAD MARK V 150ML (SYRINGE) ×4 IMPLANT
TUBING CONTRAST HIGH PRESS 72 (TUBING) ×4 IMPLANT
WIRE G V18X300CM (WIRE) ×4 IMPLANT
WIRE J 3MM .035X145CM (WIRE) ×4 IMPLANT

## 2015-04-04 NOTE — Progress Notes (Signed)
ID E note S/p I and D 11/30 Cx with stentotrophomonas R to bactrim and levo.  I have spoken with micro lab and asked them to send cutlure to [facility] for further sensitiivte testing. In the Meantime will change vanco and zosyn to ceftazidime and minocylcine

## 2015-04-04 NOTE — Care Management Note (Signed)
Case Management Note  Patient Details  Name: [name] MRN: [id number] Date of Birth: Apr 29, 1946  Subjective/Objective:                  Met with patient to discuss discharge planning. She plans to return home with her husband. She and her husband have administered IV ABX in the home setting and she is not concerned with that. She states she ambulates with a walker which is present in this room. She has used [facility] in the past. Needs orders for PICC, PT, RN, IV and labs. I asked patient about wound on her coccyx and she blames that on "wearing a diaper at the nursing home". She states she is mobile at home.   Action/Plan: Referral made to [facility]). RNCM will continue to follow.   Expected Discharge Date:                  Expected Discharge Plan:     In-House Referral:     Discharge planning Services  CM Consult  Post Acute Care Choice:  Home Health, Durable Medical Equipment Choice offered to:  Patient  DME Arranged:    DME Agency:  [place]:  PT, RN [facility] Agency:  [place]  Status of Service:  In process, will continue to follow  Medicare Important Message Given:  Yes Date Medicare IM Given:    Medicare IM give by:    Date Additional Medicare IM Given:    Additional Medicare Important Message give by:     If discussed at [place] of Stay Meetings, dates discussed:    Additional Comments:  Krystal Garfinkel, RN 04/04/2015, 10:01 AM

## 2015-04-04 NOTE — Progress Notes (Signed)
PT Cancellation Note  Patient Details Name: AFSHAN MERRICK MRN: [id number] DOB: 12-24-1945   Cancelled Treatment:    Reason Eval/Treat Not Completed: Other (comment). Pt currently being taken for angiogram. Will perform evaluation at later time.   Ninetta Adelstein 04/04/2015, 1:57 PM Greggory Stallion, PT, DPT [phone]

## 2015-04-04 NOTE — Care Management Important Message (Signed)
Important Message  Patient Details  Name: [name] MRN: [id number] Date of Birth: 24-Aug-1945   Medicare Important Message Given:  Yes    [name], RN 04/04/2015, 9:53 AM

## 2015-04-04 NOTE — Progress Notes (Signed)
1 Day Post-Op  Subjective: Patient seen. Some pain in the right foot over night but states it is currently not painful.  Objective: Vital signs in last 24 hours: Temp:  [97.8 F (36.6 C)-99.2 F (37.3 C)] 99.2 F (37.3 C) (12/01 0716) Pulse Rate:  [62-78] 72 (12/01 0716) Resp:  [16-26] 16 (12/01 0716) BP: (130-200)/(48-80) 162/62 mmHg (12/01 0716) SpO2:  [94 %-99 %] 96 % (12/01 0716) Weight:  [69.627 kg (153 lb 8 oz)] 69.627 kg (153 lb 8 oz) (12/01 0230) Last BM Date: 04/04/15  Intake/Output from previous day: 11/30 0701 - 12/01 0700 In: 755 [P.O.:120; I.V.:285; IV Piggyback:350] Out: 750 [Urine:750] Intake/Output this shift:    Incision/Wound: The bandages clean dry and intact. No evidence of any bleeding through the bandage.  Lab Results:   Recent Labs  04/02/15 1350 04/02/15 2230  WBC 2.8*  --   HGB 6.9* 7.4*  HCT 21.4* 22.2*  PLT 259  --    BMET  Recent Labs  04/02/15 1350 04/04/15 0619  NA 138  --   K 4.1  --   CL 114*  --   CO2 20*  --   GLUCOSE 137*  --   BUN 33*  --   CREATININE 1.25* 1.30*  CALCIUM 7.4*  --    PT/INR  Recent Labs  04/02/15 1350  LABPROT 16.6*  INR 1.33   ABG No results for input(s): PHART, HCO3 in the last 72 hours.  Invalid input(s): PCO2, PO2  Studies/Results: US Venous Img Lower Unilateral Right  04/03/2015  CLINICAL DATA:  Right lower extremity swelling. EXAM: RIGHT LOWER EXTREMITY VENOUS DOPPLER ULTRASOUND TECHNIQUE: Gray-scale sonography with graded compression, as well as color Doppler and duplex ultrasound were performed to evaluate the lower extremity deep venous systems from the level of the common femoral vein and including the common femoral, femoral, profunda femoral, popliteal and calf veins including the posterior tibial, peroneal and gastrocnemius veins when visible. The superficial great saphenous vein was also interrogated. Spectral Doppler was utilized to evaluate flow at rest and with distal  augmentation maneuvers in the common femoral, femoral and popliteal veins. COMPARISON:  MRI 01/31/2015.  ULTRASOUND 01/29/2015. FINDINGS: Contralateral Common Femoral Vein: Respiratory phasicity is normal and symmetric with the symptomatic side. No evidence of thrombus. Normal compressibility. Common Femoral Vein: No evidence of thrombus. Normal compressibility, respiratory phasicity and response to augmentation. Saphenofemoral Junction: No evidence of thrombus. Normal compressibility and flow on color Doppler imaging. Profunda Femoral Vein: No evidence of thrombus. Normal compressibility and flow on color Doppler imaging. Femoral Vein: No evidence of thrombus. Normal compressibility, respiratory phasicity and response to augmentation. Popliteal Vein: No evidence of thrombus. Normal compressibility, respiratory phasicity and response to augmentation. Calf Veins: No evidence of thrombus. Normal compressibility and flow on color Doppler imaging. Superficial Great Saphenous Vein: No evidence of thrombus. Normal compressibility and flow on color Doppler imaging. Venous Reflux:  None. Other Findings: 5.0 x 1.1 x 4.3 cm complex cystic fluid collection in the right popliteal fossa consistent with Baker cyst. 2.8 x 0.4 x 0.9 cm density noted in the right inguinal region consistent with a lymph node. Similar findings noted on prior exam. IMPRESSION: No evidence of deep venous thrombosis. Electronically Signed   By: Marcello Moores  Register   On: 04/03/2015 13:14    Anti-infectives: Anti-infectives    Start     Dose/Rate Route Frequency Ordered Stop   04/04/15 1130  minocycline (MINOCIN,DYNACIN) capsule 100 mg     100 mg Oral  2 times daily 04/04/15 1115     04/04/15 1130  cefTAZidime (FORTAZ) 1 g in dextrose 5 % 50 mL IVPB     1 g 100 mL/hr over 30 Minutes Intravenous Every 12 hours 04/04/15 1121     04/03/15 1700  vancomycin (VANCOCIN) IVPB 1000 mg/200 mL premix  Status:  Discontinued     1,000 mg 200 mL/hr over 60  Minutes Intravenous Every 24 hours 04/02/15 1529 04/04/15 1115   04/02/15 1700  piperacillin-tazobactam (ZOSYN) IVPB 3.375 g  Status:  Discontinued     3.375 g 12.5 mL/hr over 240 Minutes Intravenous 3 times per day 04/02/15 1529 04/04/15 1115   04/02/15 1345  vancomycin (VANCOCIN) 1,500 mg in sodium chloride 0.9 % 500 mL IVPB     1,500 mg 250 mL/hr over 120 Minutes Intravenous  Once 04/02/15 1337 04/02/15 2221   04/02/15 1345  piperacillin-tazobactam (ZOSYN) IVPB 3.375 g  Status:  Discontinued     3.375 g 12.5 mL/hr over 240 Minutes Intravenous  Once 04/02/15 1337 04/02/15 1529      Assessment/Plan: s/p Procedure(s): IRRIGATION AND DEBRIDEMENT FOOT (Right) Bandage was left intact. Scheduled to have a vascular procedure today. We will plan on changing the bandage tomorrow.  LOS: 2 days    [name] W. 04/04/2015

## 2015-04-04 NOTE — Op Note (Signed)
[facility] Percutaneous Study/Intervention Procedural Note   Date of Surgery: 04/04/2015  Surgeon(s):DEW,JASON   Assistants:none  Pre-operative Diagnosis: PAD with ulceration RLE  Post-operative diagnosis: Same  Procedure(s) Performed: 1. Ultrasound guidance for vascular access left femoral artery 2. Catheter placement into right anterior tibial artery, posterior tibial artery, and peroneal arteries 3. Aortogram and selective right lower extremity angiogram including selective injections of the anterior tibial artery, posterior tibial artery, and peroneal arteries 4. Percutaneous transluminal angioplasty of right tibioperoneal trunk and proximal peroneal artery with 3 mm diameter by 12 cm length angioplasty balloon 5. StarClose closure device left femoral artery  EBL: 25 cc  Indications: Patient is a 69 year old female with nonhealing ulceration of the right foot. The patient is brought in for angiography for further evaluation and potential treatment. Risks and benefits are discussed and informed consent is obtained  Procedure: The patient was identified and appropriate procedural time out was performed. The patient was then placed supine on the table and prepped and draped in the usual sterile fashion. Ultrasound was used to evaluate the left common femoral artery. It was patent . A digital ultrasound image was acquired. A Seldinger needle was used to access the left common femoral artery under direct ultrasound guidance and a permanent image was performed. A 0.035 J wire was advanced without resistance and a 5Fr sheath was placed. Pigtail catheter was placed into the aorta and an AP aortogram was performed. This demonstrated normal renal arteries and normal aorta and iliac segments without significant stenosis. I then crossed the aortic bifurcation and advanced to the right  femoral head. Selective right lower extremity angiogram was then performed. This demonstrated normal flow to the tibial trifurcation but a moderate to high-grade stenosis within the tibioperoneal trunk and proximal peroneal artery in the 70-80% range. The anterior tibial artery and posterior tibial arteries occluded shortly after their origins with distal reconstitution at the ankle. The patient was systemically heparinized and a 6 [place] Ansell sheath was then placed over the [employer] wire. I then used a Kumpe catheter and the advantage wire to navigate down into the tibial vessels. I initially tried to treat the anterior tibial artery and was able to get a CXI catheter into the mid to distal anterior tibial artery and get selective injections but despite using multiple wires including a 0.018 advantage wire, V 18 wire, and the 035 wire I was unable to get intraluminal flow distally. I then turned my attention to the posterior tibial artery. Using a CXI catheter navigated into the posterior tibial artery occlusion. Selective imaging was performed showing the distal reconstitution. Again, despite the use of multiple wires and catheters I was unable to cross the posterior tibial artery occlusion and gain intraluminal flow distally. I then turned my attention to the tibioperoneal trunk and peroneal artery stenosis. I crossed this without difficulty with the CXI catheter and a 0.018 advantage wire. I then treated the tibioperoneal trunk and proximal peroneal artery with a 3 mm diameter by 12 cm length angioplasty balloon inflated to 12 atm for 1 minute. Completion angiogram following this treatment showed a less than 20% residual stenosis in the tibioperoneal trunk and proximal peroneal artery. I elected to terminate the procedure. The sheath was removed and StarClose closure device was deployed in the left femoral artery with excellent hemostatic result. The patient was taken to the recovery room in stable  condition having tolerated the procedure well.  Findings:  Aortogram: normal renals, iliacs, and aorta Right  Lower Extremity: severe tibial disease   Disposition: Patient was taken to the recovery room in stable condition having tolerated the procedure well.  Complications: None  DEW,JASON 04/04/2015 5:22 PM

## 2015-04-04 NOTE — Progress Notes (Signed)
[place] [facility] [place] NAME: [name]    MR#:  [id number]  DATE OF BIRTH:  10-31-1945  SUBJECTIVE:  CHIEF COMPLAINT: leg swelling and pain on right side.  s/p debridement surgery 04/03/15.  REVIEW OF SYSTEMS:  CONSTITUTIONAL: No fever, fatigue or weakness.  EYES: No blurred or double vision.  EARS, NOSE, AND THROAT: No tinnitus or ear pain.  RESPIRATORY: No cough, shortness of breath, wheezing or hemoptysis.  CARDIOVASCULAR: No chest pain, orthopnea, edema.  GASTROINTESTINAL: No nausea, vomiting, diarrhea or abdominal pain.  GENITOURINARY: No dysuria, hematuria.  ENDOCRINE: No polyuria, nocturia,  HEMATOLOGY: No anemia, easy bruising or bleeding SKIN: No rash or lesion. MUSCULOSKELETAL: No joint pain or arthritis.   NEUROLOGIC: No tingling, numbness, weakness.  PSYCHIATRY: No anxiety or depression.   ROS  DRUG ALLERGIES:   Allergies  Allergen Reactions  . Codeine Other (See Comments)    Reaction:  Hallucinations    VITALS:  Blood pressure 170/61, pulse 71, temperature 100.3 F (37.9 C), temperature source Oral, resp. rate 18, height 5\' 7"  (1.702 m), weight 69.627 kg (153 lb 8 oz), SpO2 95 %.  PHYSICAL EXAMINATION:  GENERAL:  69 y.o.-year-old patient lying in the bed with no acute distress.  EYES: Pupils equal, round, reactive to light and accommodation. No scleral icterus. Extraocular muscles intact.  HEENT: Head atraumatic, normocephalic. Oropharynx and nasopharynx clear.  NECK:  Supple, no jugular venous distention. No thyroid enlargement, no tenderness.  LUNGS: Normal breath sounds bilaterally, no wheezing, rales,rhonchi or crepitation. No use of accessory muscles of respiration.  CARDIOVASCULAR: S1, S2 normal. No murmurs, rubs, or gallops.  ABDOMEN: Soft, nontender, nondistended. Bowel sounds present. No organomegaly or mass.  EXTREMITIES: right leg pedal edema, no tenderness, no cyanosis, or clubbing. dressing  present on right foot. NEUROLOGIC: Cranial nerves II through XII are intact. Muscle strength 5/5 in all extremities. Sensation intact. Gait not checked.  PSYCHIATRIC: The patient is alert and oriented x 3.  SKIN: No obvious rash, lesion, or ulcer.   Physical Exam LABORATORY PANEL:   CBC  Recent Labs Lab 04/02/15 1350 04/02/15 2230  WBC 2.8*  --   HGB 6.9* 7.4*  HCT 21.4* 22.2*  PLT 259  --    ------------------------------------------------------------------------------------------------------------------  Chemistries   Recent Labs Lab 04/02/15 1350 04/04/15 0619  NA 138  --   K 4.1  --   CL 114*  --   CO2 20*  --   GLUCOSE 137*  --   BUN 33*  --   CREATININE 1.25* 1.30*  CALCIUM 7.4*  --   AST 24  --   ALT 9*  --   ALKPHOS 68  --   BILITOT <0.1*  --    ------------------------------------------------------------------------------------------------------------------  Cardiac Enzymes No results for input(s): TROPONINI in the last 168 hours. ------------------------------------------------------------------------------------------------------------------  RADIOLOGY:  US Venous Img Lower Unilateral Right  04/03/2015  CLINICAL DATA:  Right lower extremity swelling. EXAM: RIGHT LOWER EXTREMITY VENOUS DOPPLER ULTRASOUND TECHNIQUE: Gray-scale sonography with graded compression, as well as color Doppler and duplex ultrasound were performed to evaluate the lower extremity deep venous systems from the level of the common femoral vein and including the common femoral, femoral, profunda femoral, popliteal and calf veins including the posterior tibial, peroneal and gastrocnemius veins when visible. The superficial great saphenous vein was also interrogated. Spectral Doppler was utilized to evaluate flow at rest and with distal augmentation maneuvers in the common femoral, femoral and popliteal veins. COMPARISON:  MRI 01/31/2015.  ULTRASOUND 01/29/2015. FINDINGS: Contralateral  Common Femoral Vein: Respiratory phasicity is normal and symmetric with the symptomatic side. No evidence of thrombus. Normal compressibility. Common Femoral Vein: No evidence of thrombus. Normal compressibility, respiratory phasicity and response to augmentation. Saphenofemoral Junction: No evidence of thrombus. Normal compressibility and flow on color Doppler imaging. Profunda Femoral Vein: No evidence of thrombus. Normal compressibility and flow on color Doppler imaging. Femoral Vein: No evidence of thrombus. Normal compressibility, respiratory phasicity and response to augmentation. Popliteal Vein: No evidence of thrombus. Normal compressibility, respiratory phasicity and response to augmentation. Calf Veins: No evidence of thrombus. Normal compressibility and flow on color Doppler imaging. Superficial Great Saphenous Vein: No evidence of thrombus. Normal compressibility and flow on color Doppler imaging. Venous Reflux:  None. Other Findings: 5.0 x 1.1 x 4.3 cm complex cystic fluid collection in the right popliteal fossa consistent with Baker cyst. 2.8 x 0.4 x 0.9 cm density noted in the right inguinal region consistent with a lymph node. Similar findings noted on prior exam. IMPRESSION: No evidence of deep venous thrombosis. 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 IV vanc and Zosyn. S/p debridement sx 04/03/15. PICC eventually depending on surgical findings Bcx x 2 ID changed Abx to ceftazidime and minocycline. Wait for ID and sx to have final ABx recommendation.  * HTN, uncontrolled Clonidine added. Restart home meds.  * DM SSI, ADA, glimepiride.  * DVT prophylaxis Lovenox Negative right leg Doppler for DVT.   All the records are reviewed and case discussed with Care  Management/Social Workerr. Management plans discussed with the patient, family and they are in agreement.  CODE STATUS: full  TOTAL TIME TAKING CARE OF THIS PATIENT: 35 minutes.    POSSIBLE D/C IN 1-2 DAYS, DEPENDING ON CLINICAL CONDITION.   Vaughan Basta M.D on 04/04/2015   Between 7am to 6pm - Pager - [phone]  After 6pm go to [email or web address] - password [email or web address] [facility]  Office  [phone]  CC: Primary care physician; Morton Peters, MD  Note: This dictation was prepared with Dragon dictation along with smaller phrase technology. Any transcriptional errors that result from this process are unintentional.

## 2015-04-04 NOTE — Progress Notes (Signed)
Pt had PAD today (see note). Fever of 99.6 noted upon return. Reported to oncoming nurse, will give tylenol. VS otherwise WNL. Lie flat until 1915 this evening. Femoral dressing to L groin C/D/I.

## 2015-04-05 ENCOUNTER — Inpatient Hospital Stay: Payer: Medicare HMO

## 2015-04-05 ENCOUNTER — Encounter: Payer: Self-pay | Admitting: Vascular Surgery

## 2015-04-05 LAB — BASIC METABOLIC PANEL
Anion gap: 5 (ref 5–15)
BUN: 35 mg/dL — AB (ref 6–20)
CHLORIDE: 114 mmol/L — AB (ref 101–111)
CO2: 20 mmol/L — AB (ref 22–32)
Calcium: 7.2 mg/dL — ABNORMAL LOW (ref 8.9–10.3)
Creatinine, Ser: 1.4 mg/dL — ABNORMAL HIGH (ref 0.44–1.00)
GFR calc Af Amer: 43 mL/min — ABNORMAL LOW (ref >60)
GFR calc non Af Amer: 37 mL/min — ABNORMAL LOW (ref >60)
Glucose, Bld: 111 mg/dL — ABNORMAL HIGH (ref 65–99)
POTASSIUM: 3.7 mmol/L (ref 3.5–5.1)
SODIUM: 139 mmol/L (ref 135–145)

## 2015-04-05 LAB — GLUCOSE, CAPILLARY
GLUCOSE-CAPILLARY: 147 mg/dL — AB (ref 65–99)
Glucose-Capillary: 112 mg/dL — ABNORMAL HIGH (ref 65–99)

## 2015-04-05 LAB — SURGICAL PATHOLOGY

## 2015-04-05 SURGERY — Surgical Case
Anesthesia: *Unknown

## 2015-04-05 MED ORDER — INSULIN ASPART 100 UNIT/ML ~~LOC~~ SOLN
0.0000 [IU] | Freq: Three times a day (TID) | SUBCUTANEOUS | Status: DC
  Administered 2015-04-05: 1 [IU] via SUBCUTANEOUS
  Administered 2015-04-06: 2 [IU] via SUBCUTANEOUS
  Administered 2015-04-06: 1 [IU] via SUBCUTANEOUS
  Administered 2015-04-07 (×2): 2 [IU] via SUBCUTANEOUS
  Administered 2015-04-08 (×2): 1 [IU] via SUBCUTANEOUS
  Filled 2015-04-05: qty 1
  Filled 2015-04-05 (×2): qty 2
  Filled 2015-04-05 (×2): qty 1
  Filled 2015-04-05: qty 2
  Filled 2015-04-05: qty 1

## 2015-04-05 MED ORDER — SODIUM CHLORIDE 0.9 % IV SOLN
INTRAVENOUS | Status: DC
  Administered 2015-04-05 – 2015-04-08 (×4): via INTRAVENOUS

## 2015-04-05 NOTE — Progress Notes (Signed)
Infectious Disease Long Term IV Antibiotic Orders  Diagnosis: Diabetic foot infection, pvd  Culture results Results for orders placed or performed during the hospital encounter of 04/02/15  Wound culture     Status: None (Preliminary result)   Collection Time: 04/02/15  1:50 AM  Result Value Ref Range Status   Specimen Description ULCER  Final   Special Requests Normal  Final   Gram Stain   Final    MODERATE WBC SEEN RARE GRAM POSITIVE COCCI IN PAIRS RARE GRAM VARIABLE ROD    Culture   Final    MODERATE GROWTH STENOTROPHOMONAS MALTOPHILIA SENDING TO [facility] FOR FURTHER SUSCEPTIBILITY TESTING PER DR. Arlyce Circle    Report Status PENDING  Incomplete   Organism ID, Bacteria STENOTROPHOMONAS MALTOPHILIA  Final      Susceptibility   Stenotrophomonas maltophilia - MIC*    LEVOFLOXACIN >=8 RESISTANT Resistant     TRIMETH/SULFA 80 RESISTANT Resistant     * MODERATE GROWTH STENOTROPHOMONAS MALTOPHILIA  Surgical pcr screen     Status: None   Collection Time: 04/02/15 12:34 PM  Result Value Ref Range Status   MRSA, PCR NEGATIVE NEGATIVE Final   Staphylococcus aureus NEGATIVE NEGATIVE Final    Comment:        The Xpert SA Assay (FDA approved for NASAL specimens in patients over 46 years of age), is one component of a comprehensive surveillance program.  Test performance has been validated by [facility] for patients greater than or equal to 40 year old. It is not intended to diagnose infection nor to guide or monitor treatment.   Culture, blood (routine x 2)     Status: None (Preliminary result)   Collection Time: 04/02/15  1:50 PM  Result Value Ref Range Status   Specimen Description BLOOD RIGHT HAND  Final   Special Requests BOTTLES DRAWN AEROBIC AND ANAEROBIC 4CC  Final   Culture NO GROWTH 2 DAYS  Final   Report Status PENDING  Incomplete  Culture, blood (routine x 2)     Status: None (Preliminary result)   Collection Time: 04/02/15  2:08 PM  Result Value Ref Range Status    Specimen Description BLOOD LEFT ASSIST CONTROL  Final   Special Requests BOTTLES DRAWN AEROBIC AND ANAEROBIC 4CC  Final   Culture NO GROWTH 2 DAYS  Final   Report Status PENDING  Incomplete    Allergies:  Allergies  Allergen Reactions  . Codeine Other (See Comments)    Reaction:  Hallucinations    Discharge antibiotics Ceftazidime 1 GM IV q 12 hours  Minocycline PO 100 mg tabs twice a day  PICC Care per protocol Labs weekly while on IV antibiotics     FAX weekly labs to [phone] CBC w diff   Comprehensive met panel  CRP    Planned duration of antibiotics 4 weeks  Stop date 05/01/15  Follow up clinic date TBD    Leonel Ramsay, MD

## 2015-04-05 NOTE — Progress Notes (Signed)
[facility] NAME: [name]    MR#:  [id number]  DATE OF BIRTH:  May 14, 1945  SUBJECTIVE:  CHIEF COMPLAINT: leg swelling and pain on right side.  s/p debridement surgery 04/03/15.  pain is now under control.  REVIEW OF SYSTEMS:  CONSTITUTIONAL: No fever, fatigue or weakness.  EYES: No blurred or double vision.  EARS, NOSE, AND THROAT: No tinnitus or ear pain.  RESPIRATORY: No cough, shortness of breath, wheezing or hemoptysis.  CARDIOVASCULAR: No chest pain, orthopnea, edema.  GASTROINTESTINAL: No nausea, vomiting, diarrhea or abdominal pain.  GENITOURINARY: No dysuria, hematuria.  ENDOCRINE: No polyuria, nocturia,  HEMATOLOGY: No anemia, easy bruising or bleeding SKIN: No rash or lesion. MUSCULOSKELETAL: No joint pain or arthritis.   NEUROLOGIC: No tingling, numbness, weakness.  PSYCHIATRY: No anxiety or depression.   ROS  DRUG ALLERGIES:   Allergies  Allergen Reactions  . Codeine Other (See Comments)    Reaction:  Hallucinations    VITALS:  Blood pressure 134/48, pulse 67, temperature 99.1 F (37.3 C), temperature source Oral, resp. rate 20, height 5\' 7"  (1.702 m), weight 71.636 kg (157 lb 14.9 oz), SpO2 95 %.  PHYSICAL EXAMINATION:  GENERAL:  69 y.o.-year-old patient lying in the bed with no acute distress.  EYES: Pupils equal, round, reactive to light and accommodation. No scleral icterus. Extraocular muscles intact.  HEENT: Head atraumatic, normocephalic. Oropharynx and nasopharynx clear.  NECK:  Supple, no jugular venous distention. No thyroid enlargement, no tenderness.  LUNGS: Normal breath sounds bilaterally, no wheezing, rales,rhonchi or crepitation. No use of accessory muscles of respiration.  CARDIOVASCULAR: S1, S2 normal. No murmurs, rubs, or gallops.  ABDOMEN: Soft, nontender, nondistended. Bowel sounds present. No organomegaly or mass.  EXTREMITIES: right leg pedal edema, no tenderness, no  cyanosis, or clubbing. dressing present on right foot. NEUROLOGIC: Cranial nerves II through XII are intact. Muscle strength 5/5 in all extremities. Sensation intact. Gait not checked.  PSYCHIATRIC: The patient is alert and oriented x 3.  SKIN: No obvious rash, lesion, or ulcer.   Physical Exam LABORATORY PANEL:   CBC  Recent Labs Lab 04/02/15 1350 04/02/15 2230  WBC 2.8*  --   HGB 6.9* 7.4*  HCT 21.4* 22.2*  PLT 259  --    ------------------------------------------------------------------------------------------------------------------  Chemistries   Recent Labs Lab 04/02/15 1350  04/05/15 0537  NA 138  --  139  K 4.1  --  3.7  CL 114*  --  114*  CO2 20*  --  20*  GLUCOSE 137*  --  111*  BUN 33*  --  35*  CREATININE 1.25*  < > 1.40*  CALCIUM 7.4*  --  7.2*  AST 24  --   --   ALT 9*  --   --   ALKPHOS 68  --   --   BILITOT <0.1*  --   --   < > = values in this interval not displayed. ------------------------------------------------------------------------------------------------------------------  Cardiac Enzymes No results for input(s): TROPONINI in the last 168 hours. ------------------------------------------------------------------------------------------------------------------  RADIOLOGY:  Dg Chest Port 1 View  04/05/2015  CLINICAL DATA:  New PICC line on the left. EXAM: PORTABLE CHEST 1 VIEW COMPARISON:  02/02/2015 FINDINGS: Left-sided PICC line tip overlies the level of the lower superior vena cava. Right-sided PICC line has been removed. The heart is enlarged. There is left basilar opacity obscuring the hemidiaphragm. Left pleural effusion is present. There is mild hazy density within the right upper lobe,  stable. IMPRESSION: Interval placement of left-sided PICC line. Significant left base opacity and pleural effusion. Electronically Signed   By: Nolon Nations M.D.   On: 04/05/2015 13:36    ASSESSMENT AND PLAN:   Active Problems:   Pressure ulcer    Diabetic osteomyelitis (HCC)   Osteomyelitis of ankle or foot ([place])  * Right foot osteomyelitis Consulted podiatry and ID. IV vanc and Zosyn. S/p debridement sx 04/03/15. PICC placed 04/05/15 Bcx x 2 ID changed Abx to ceftazidime and minocycline. Appreciated final recommendations from ID for ABx.  * ac renal failure   Renal func is worse gradually on last 2 days   Monitor with IV fluids.  * HTN, uncontrolled Clonidine added. Restart home meds.  * anemia of iron deficiency   Replace orally.  * DM SSI, ADA, glimepiride.  * DVT prophylaxis Lovenox Negative right leg Doppler for DVT.   All the records are reviewed and case discussed with Care Management/Social Workerr. Management plans discussed with the patient, family and they are in agreement.  CODE STATUS: full  TOTAL TIME TAKING CARE OF THIS PATIENT: 35 minutes.  Waiting for renal func to improve.  POSSIBLE D/C IN 1-2 DAYS, DEPENDING ON CLINICAL CONDITION.   Vaughan Basta M.D on 04/05/2015   Between 7am to 6pm - Pager - [phone]  After 6pm go to [email or web address] - password [email or web address] [facility]  Office  [phone]  CC: Primary care physician; Morton Peters, MD  Note: This dictation was prepared with Dragon dictation along with smaller phrase technology. Any transcriptional errors that result from this process are unintentional.

## 2015-04-05 NOTE — Care Management (Signed)
PICC placement today. PT recommending HHPT. HHRN will be needed for IV ABX. Per PT a rollator would not be safe.

## 2015-04-05 NOTE — Progress Notes (Addendum)
Delivered post op wedge shoe to patient's room, prior to physical therapy session.

## 2015-04-05 NOTE — Progress Notes (Signed)
1 Day Post-Op  Subjective: Patient seen. She does complain of some pain in the right foot at a level of about 6 out of 10. Hoping to go home after her PICC line placement.  Objective: Vital signs in last 24 hours: Temp:  [98.6 F (37 C)-100.3 F (37.9 C)] 98.6 F (37 C) (12/02 0811) Pulse Rate:  [69-76] 69 (12/02 0811) Resp:  [16-25] 18 (12/02 0811) BP: (124-175)/(55-111) 164/66 mmHg (12/02 0811) SpO2:  [94 %-99 %] 94 % (12/02 0811) Weight:  [71.636 kg (157 lb 14.9 oz)] 71.636 kg (157 lb 14.9 oz) (12/02 0500) Last BM Date: 04/04/15  Intake/Output from previous day: 12/01 0701 - 12/02 0700 In: 677.5 [I.V.:577.5; IV Piggyback:100] Out: 0  Intake/Output this shift: Total I/O In: 3 [I.V.:3] Out: -   The bandages dry and intact. Upon removal there is mild to moderate bleeding from the incision sites on the bandaging. No signs of purulence. Incisions are well coapted.  Lab Results:   Recent Labs  04/02/15 1350 04/02/15 2230  WBC 2.8*  --   HGB 6.9* 7.4*  HCT 21.4* 22.2*  PLT 259  --    BMET  Recent Labs  04/02/15 1350 04/04/15 0619 04/05/15 0537  NA 138  --  139  K 4.1  --  3.7  CL 114*  --  114*  CO2 20*  --  20*  GLUCOSE 137*  --  111*  BUN 33*  --  35*  CREATININE 1.25* 1.30* 1.40*  CALCIUM 7.4*  --  7.2*   PT/INR  Recent Labs  04/02/15 1350  LABPROT 16.6*  INR 1.33   ABG No results for input(s): PHART, HCO3 in the last 72 hours.  Invalid input(s): PCO2, PO2  Studies/Results: US Venous Img Lower Unilateral Right  04/03/2015  CLINICAL DATA:  Right lower extremity swelling. EXAM: RIGHT LOWER EXTREMITY VENOUS DOPPLER ULTRASOUND TECHNIQUE: Gray-scale sonography with graded compression, as well as color Doppler and duplex ultrasound were performed to evaluate the lower extremity deep venous systems from the level of the common femoral vein and including the common femoral, femoral, profunda femoral, popliteal and calf veins including the posterior  tibial, peroneal and gastrocnemius veins when visible. The superficial great saphenous vein was also interrogated. Spectral Doppler was utilized to evaluate flow at rest and with distal augmentation maneuvers in the common femoral, femoral and popliteal veins. COMPARISON:  MRI 01/31/2015.  ULTRASOUND 01/29/2015. FINDINGS: Contralateral Common Femoral Vein: Respiratory phasicity is normal and symmetric with the symptomatic side. No evidence of thrombus. Normal compressibility. Common Femoral Vein: No evidence of thrombus. Normal compressibility, respiratory phasicity and response to augmentation. Saphenofemoral Junction: No evidence of thrombus. Normal compressibility and flow on color Doppler imaging. Profunda Femoral Vein: No evidence of thrombus. Normal compressibility and flow on color Doppler imaging. Femoral Vein: No evidence of thrombus. Normal compressibility, respiratory phasicity and response to augmentation. Popliteal Vein: No evidence of thrombus. Normal compressibility, respiratory phasicity and response to augmentation. Calf Veins: No evidence of thrombus. Normal compressibility and flow on color Doppler imaging. Superficial Great Saphenous Vein: No evidence of thrombus. Normal compressibility and flow on color Doppler imaging. Venous Reflux:  None. Other Findings: 5.0 x 1.1 x 4.3 cm complex cystic fluid collection in the right popliteal fossa consistent with Baker cyst. 2.8 x 0.4 x 0.9 cm density noted in the right inguinal region consistent with a lymph node. Similar findings noted on prior exam. IMPRESSION: No evidence of deep venous thrombosis. Electronically Signed   By: Marcello Moores  Register   On: 04/03/2015 13:14    Anti-infectives: Anti-infectives    Start     Dose/Rate Route Frequency Ordered Stop   04/04/15 1645  cefTAZidime (FORTAZ) 1 g in dextrose 5 % 50 mL IVPB     1 g 100 mL/hr over 30 Minutes Intravenous  Once 04/04/15 1634 04/04/15 1704   04/04/15 1130  minocycline (MINOCIN,DYNACIN)  capsule 100 mg     100 mg Oral 2 times daily 04/04/15 1115     04/04/15 1130  cefTAZidime (FORTAZ) 1 g in dextrose 5 % 50 mL IVPB     1 g 100 mL/hr over 30 Minutes Intravenous Every 12 hours 04/04/15 1121     04/03/15 1700  vancomycin (VANCOCIN) IVPB 1000 mg/200 mL premix  Status:  Discontinued     1,000 mg 200 mL/hr over 60 Minutes Intravenous Every 24 hours 04/02/15 1529 04/04/15 1115   04/02/15 1700  piperacillin-tazobactam (ZOSYN) IVPB 3.375 g  Status:  Discontinued     3.375 g 12.5 mL/hr over 240 Minutes Intravenous 3 times per day 04/02/15 1529 04/04/15 1115   04/02/15 1345  vancomycin (VANCOCIN) 1,500 mg in sodium chloride 0.9 % 500 mL IVPB     1,500 mg 250 mL/hr over 120 Minutes Intravenous  Once 04/02/15 1337 04/02/15 2221   04/02/15 1345  piperacillin-tazobactam (ZOSYN) IVPB 3.375 g  Status:  Discontinued     3.375 g 12.5 mL/hr over 240 Minutes Intravenous  Once 04/02/15 1337 04/02/15 1529      Assessment/Plan: s/p Procedure(s): Abdominal Aortogram w/Lower Extremity (N/A) Lower Extremity Intervention Assessment: Osteomyelitis status post debridement bone with second toe ray resection   Plan: Betadine and a sterile bandage reapplied to the right foot. Scheduled to receive her PICC line today. She should be stable for discharge as far as her foot goes. Would recommend follow-up in the office this week for reevaluation of the surgical sites. At this point just keep the bandage clean and dry and do not remove it at home  LOS: 3 days    [name] W. 04/05/2015

## 2015-04-05 NOTE — Clinical Documentation Improvement (Signed)
Internal Medicine  Can the diagnosis of " anemia unspecified" be further specified? Thank you   Iron deficiency Anemia  Nutritional anemia, including the nutrition or mineral deficits  Chronic Anemia, including the suspected or known cause  Anemia of chronic disease, including the associated chronic disease state  Other  Clinically Undetermined  Document any associated diagnoses/conditions.   Supporting Information: Hemoglobin 12.0 - 16.0 g/dL 7.4 (L) 6.9 (L)        HCT 35.0 - 47.0 % 22.2 (L) 21.4 (L)       Pt on  IV NS @ 75 ml/hr, ferrous sulfate 325 mg daily   Please exercise your independent, professional judgment when responding. A specific answer is not anticipated or expected.   Thank You,  [place] [phone]

## 2015-04-05 NOTE — Progress Notes (Signed)
Assessed sacral wound w/ dressing change. Stg 2-3, wound bed is red/pink without slough or eschar. Approx 1" round wound. Applied foam dressing and repositioned pt.

## 2015-04-05 NOTE — Evaluation (Signed)
Physical Therapy Evaluation Patient Details Name: [name] MRN: [id number] DOB: 11/21/1945 Today's Date: 04/05/2015   History of Present Illness  Pt is 69 year old female who was admitted for pressure ulcer and DM osteomyelitis. Pt with recent amputation on R toe 1 month ago, however now needs further debridement. Pt is now s/p I & D on 11/30 for 2nd toe amputation on R foot with Annalese Stiner resection on 2nd metarsal. Pt is also s/p R side angiogram secondary to poor healing on 12/1. Per orders, off loading boot to be worn when OOB.  Clinical Impression  Pt is a pleasant 69 year old female who was admitted for decreased mobility s/p pressure ulcer and osteomyelitis on R foot with poor healing. Pt performs bed mobility, transfers, and ambulation with cga and rw. Offloading boot applied. Pt demonstrates deficits with strength/balance/mobility. Would benefit from skilled PT to address above deficits and promote optimal return to PLOF.       Follow Up Recommendations Home health PT    Equipment Recommendations       Recommendations for Other Services       Precautions / Restrictions Precautions Precautions: Fall Restrictions Weight Bearing Restrictions: No RLE Weight Bearing: Weight bearing as tolerated      Mobility  Bed Mobility Overal bed mobility: Needs Assistance Bed Mobility: Supine to Sit     Supine to sit: Min guard     General bed mobility comments: bed mobility performed with cga. Safe technique performed  Transfers Overall transfer level: Needs assistance Equipment used: Rolling walker (2 wheeled) Transfers: Sit to/from Stand Sit to Stand: Min guard         General transfer comment: safe technique performed with rw. Off-loading boot donned.   Ambulation/Gait Ambulation/Gait assistance: Min guard Ambulation Distance (Feet): 40 Feet Assistive device: Rolling walker (2 wheeled) Gait Pattern/deviations: Step-through pattern     General Gait Details:  ambulated using reciprocal gait pattern and safe technique. Pt cued for keeping rw close to body as it tends to get too far away from her.  Stairs            Wheelchair Mobility    Modified Rankin (Stroke Patients Only)       Balance Overall balance assessment: Needs assistance Sitting-balance support: Feet supported Sitting balance-Leahy Scale: Normal     Standing balance support: Bilateral upper extremity supported Standing balance-Leahy Scale: Good                               Pertinent Vitals/Pain Pain Assessment: 0-10 Pain Score: 6  Pain Location: R foot Pain Descriptors / Indicators: Constant;Operative site guarding Pain Intervention(s): Limited activity within patient's tolerance;Monitored during session;RN gave pain meds during session    [place] expects to be discharged to:: Private residence Living Arrangements: Spouse/significant other Available Help at Discharge: Family Type of Home: House Home Access: Stairs to enter Entrance Stairs-Rails: Can reach both Entrance Stairs-Number of Steps: 2 Home Layout: One level Home Equipment: [place] - 2 wheels;Cane - single point Additional Comments: lives with spouse, independent prior    Prior Function Level of Independence: Independent with assistive device(s)         Comments: Uses RW for all mobility. Does not perform community distance ambualtion due to self reports fo low energy and acttivity tolerance.      Hand Dominance        Extremity/Trunk Assessment   Upper Extremity Assessment: Overall  WFL for tasks assessed           Lower Extremity Assessment: RLE deficits/detail RLE Deficits / Details: grossly 4/5 and limited by pain       Communication   Communication: No difficulties  Cognition Arousal/Alertness: Awake/alert Behavior During Therapy: WFL for tasks assessed/performed Overall Cognitive Status: Within Functional Limits for tasks assessed                       General Comments      Exercises Other Exercises Other Exercises: Pt performed supine ther-ex including ankle pumps, SLRs, and hip abd/add. All ther-ex performed x 10 reps with cga for assistance. Safe technique performed      Assessment/Plan    PT Assessment Patient needs continued PT services  PT Diagnosis Difficulty walking;Abnormality of gait   PT Problem List Decreased strength;Decreased activity tolerance;Decreased mobility;Pain  PT Treatment Interventions Gait training;Therapeutic exercise   PT Goals (Current goals can be found in the Care Plan section) Acute Rehab PT Goals Patient Stated Goal: to go home PT Goal Formulation: With patient Time For Goal Achievement: 04/19/15 Potential to Achieve Goals: Good    Frequency Min 2X/week   Barriers to discharge        Co-evaluation               End of Session Equipment Utilized During Treatment: Gait belt Activity Tolerance: Patient tolerated treatment well Patient left: in chair;with chair alarm set Nurse Communication: Mobility status         Time: [id number] PT Time Calculation (min) (ACUTE ONLY): 23 min   Charges:   PT Evaluation $Initial PT Evaluation Tier I: 1 Procedure PT Treatments $Therapeutic Exercise: 8-22 mins   PT G Codes:        Tajee Savant 04-06-15, 11:28 AM  Greggory Stallion, PT, DPT [phone]

## 2015-04-05 NOTE — Progress Notes (Signed)
Pt participated with PT, up to chair in AM. PICC line placed by [facility] to LUE. Pt tolerated procedure. Chest x-ray verified placement. Poor appetite, states she doesn't like the food here. Surgical dressing intact to RUE.

## 2015-04-05 NOTE — Progress Notes (Signed)
[facility] notified of need of PICC line

## 2015-04-05 NOTE — Progress Notes (Signed)
ANTIBIOTIC CONSULT NOTE - INITIAL  Pharmacy Consult for Ceftazidime Indication: osteomyelitis  Allergies  Allergen Reactions  . Codeine Other (See Comments)    Reaction:  Hallucinations    Patient Measurements: Height: 5\' 7"  (170.2 cm) Weight: 157 lb 14.9 oz (71.636 kg) IBW/kg (Calculated) : 61.6  Vital Signs: Temp: 98.6 F (37 C) (12/02 0811) Temp Source: Oral (12/02 0811) BP: 164/66 mmHg (12/02 0811) Pulse Rate: 69 (12/02 0811) Intake/Output from previous day: 12/01 0701 - 12/02 0700 In: 677.5 [I.V.:577.5; IV Piggyback:100] Out: 0  Intake/Output from this shift:    Labs:  Recent Labs  04/02/15 1350 04/02/15 2230 04/04/15 0619 04/05/15 0537  WBC 2.8*  --   --   --   HGB 6.9* 7.4*  --   --   PLT 259  --   --   --   CREATININE 1.25*  --  1.30* 1.40*   Estimated Creatinine Clearance: 36.4 mL/min (by C-G formula based on Cr of 1.4). No results for input(s): VANCOTROUGH, VANCOPEAK, VANCORANDOM, GENTTROUGH, GENTPEAK, GENTRANDOM, TOBRATROUGH, TOBRAPEAK, TOBRARND, AMIKACINPEAK, AMIKACINTROU, AMIKACIN in the last 72 hours.   Microbiology: Recent Results (from the past 720 hour(s))  Wound culture     Status: None (Preliminary result)   Collection Time: 04/02/15  1:50 AM  Result Value Ref Range Status   Specimen Description ULCER  Final   Special Requests Normal  Final   Gram Stain   Final    MODERATE WBC SEEN RARE GRAM POSITIVE COCCI IN PAIRS RARE GRAM VARIABLE ROD    Culture LIGHT GROWTH STENOTROPHOMONAS MALTOPHILIA  Final   Report Status PENDING  Incomplete   Organism ID, Bacteria STENOTROPHOMONAS MALTOPHILIA  Final      Susceptibility   Stenotrophomonas maltophilia - MIC*    LEVOFLOXACIN >=8 RESISTANT Resistant     TRIMETH/SULFA 80 RESISTANT Resistant     * LIGHT GROWTH STENOTROPHOMONAS MALTOPHILIA  Surgical pcr screen     Status: None   Collection Time: 04/02/15 12:34 PM  Result Value Ref Range Status   MRSA, PCR NEGATIVE NEGATIVE Final   Staphylococcus  aureus NEGATIVE NEGATIVE Final    Comment:        The Xpert SA Assay (FDA approved for NASAL specimens in patients over 44 years of age), is one component of a comprehensive surveillance program.  Test performance has been validated by [facility] for patients greater than or equal to 51 year old. It is not intended to diagnose infection nor to guide or monitor treatment.   Culture, blood (routine x 2)     Status: None (Preliminary result)   Collection Time: 04/02/15  1:50 PM  Result Value Ref Range Status   Specimen Description BLOOD RIGHT HAND  Final   Special Requests BOTTLES DRAWN AEROBIC AND ANAEROBIC 4CC  Final   Culture NO GROWTH 2 DAYS  Final   Report Status PENDING  Incomplete  Culture, blood (routine x 2)     Status: None (Preliminary result)   Collection Time: 04/02/15  2:08 PM  Result Value Ref Range Status   Specimen Description BLOOD LEFT ASSIST CONTROL  Final   Special Requests BOTTLES DRAWN AEROBIC AND ANAEROBIC 4CC  Final   Culture NO GROWTH 2 DAYS  Final   Report Status PENDING  Incomplete    Medical History: Past Medical History  Diagnosis Date  . Hypertension   . Diabetes mellitus without complication ([place])   . Anemia   . Coronary artery disease   . CHF (congestive heart failure) (  [place])   . Glaucoma   . Hyperlipidemia   . Chronic kidney disease   . Neuropathy ([place])   . Chronic anemia   . Stroke ([place])   . Anginal pain ([place])   . Arthritis   . Retinopathy   . Cardiomyopathy ([place])   . Carotid artery stenosis   . Hoarseness, chronic   . GERD (gastroesophageal reflux disease)   . Hemangioma of liver   . Weight loss, unintentional     Medications:  Scheduled:  . cefTAZidime (FORTAZ)  IV  1 g Intravenous Q12H  . cloNIDine  0.1 mg Oral TID  . enoxaparin (LOVENOX) injection  40 mg Subcutaneous Q24H  . feeding supplement (GLUCERNA SHAKE)  237 mL Oral TID WC  . ferrous sulfate  325 mg Oral Daily  . folic acid  1 mg Oral Daily  . furosemide  40 mg  Oral BID  . glimepiride  1 mg Oral Daily  . hydrALAZINE  100 mg Oral TID  . isosorbide dinitrate  30 mg Oral BID  . minocycline  100 mg Oral BID  . pantoprazole  40 mg Oral QAC breakfast  . potassium chloride SA  20 mEq Oral Daily  . propranolol  40 mg Oral TID  . ramipril  10 mg Oral Daily  . senna-docusate  1 tablet Oral BID  . simvastatin  20 mg Oral QHS  . sodium chloride  3 mL Intravenous Q12H  . sodium chloride  3 mL Intravenous Q12H   Assessment: Pharmacy consulted to dose Ceftazidime in a 69 yo female with osteomyelitis.  Patient's wound cultures growing stenotrophomonas maltophilia resistant to bactrim and levaquin.    SCr: 1.4, est CrCl~36.4 mL/min  Goal of Therapy:  Resolution of infection  Plan:  Per ID, wound cultures sent to [facility] for additional testing for sensitivies.  Continue current orders for Ceftazidime 1 gm IV q12h based on renal function.  MD also order minocycline 100 mg po BID.  This dosing is acceptable for renal function.   Pharmacy will continue to follow.   Stori Royse G 04/05/2015,9:41 AM

## 2015-04-05 NOTE — Progress Notes (Signed)
[facility] INFECTIOUS DISEASE PROGRESS NOTE Date of Admission:  04/02/2015     ID: [name] is a 69 y.o. 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 Getting PICC  ROS  Eleven systems are reviewed and negative except per hpi  Medications:  Antibiotics Given (last 72 hours)    Date/Time Action Medication Dose Rate   04/02/15 2021 Given   vancomycin (VANCOCIN) 1,500 mg in sodium chloride 0.9 % 500 mL IVPB 1,500 mg 250 mL/hr   04/02/15 2052 Given   piperacillin-tazobactam (ZOSYN) IVPB 3.375 g 3.375 g 12.5 mL/hr   04/03/15 0411 Given   piperacillin-tazobactam (ZOSYN) IVPB 3.375 g 3.375 g 12.5 mL/hr   04/03/15 1331 Given   piperacillin-tazobactam (ZOSYN) IVPB 3.375 g 3.375 g 12.5 mL/hr   04/03/15 1943 Given   vancomycin (VANCOCIN) IVPB 1000 mg/200 mL premix 1,000 mg 200 mL/hr   04/03/15 2013 Given   piperacillin-tazobactam (ZOSYN) IVPB 3.375 g 3.375 g 12.5 mL/hr   04/04/15 0500 Given   piperacillin-tazobactam (ZOSYN) IVPB 3.375 g 3.375 g 12.5 mL/hr   04/04/15 1400 Given   cefTAZidime (FORTAZ) 1 g in dextrose 5 % 50 mL IVPB 1 g 100 mL/hr   04/04/15 1634 Given   cefTAZidime (FORTAZ) 1 g in dextrose 5 % 50 mL IVPB 1 g 100 mL/hr   04/04/15 2250 Given   cefTAZidime (FORTAZ) 1 g in dextrose 5 % 50 mL IVPB 1 g 100 mL/hr   04/04/15 2254 Given   minocycline (MINOCIN,DYNACIN) capsule 100 mg 100 mg    04/05/15 1024 Given   minocycline (MINOCIN,DYNACIN) capsule 100 mg 100 mg    04/05/15 1158 Given   cefTAZidime (FORTAZ) 1 g in dextrose 5 % 50 mL IVPB 1 g 100 mL/hr     . cefTAZidime (FORTAZ)  IV  1 g Intravenous Q12H  . cloNIDine  0.1 mg Oral TID  . enoxaparin (LOVENOX) injection  40 mg Subcutaneous Q24H  . feeding supplement (GLUCERNA SHAKE)  237 mL Oral TID WC  . ferrous sulfate  325 mg Oral Daily  . folic  acid  1 mg Oral Daily  . furosemide  40 mg Oral BID  . glimepiride  1 mg Oral Daily  . hydrALAZINE  100 mg Oral TID  . insulin aspart  0-9 Units Subcutaneous TID WC  . isosorbide dinitrate  30 mg Oral BID  . minocycline  100 mg Oral BID  . pantoprazole  40 mg Oral QAC breakfast  . potassium chloride SA  20 mEq Oral Daily  . propranolol  40 mg Oral TID  . ramipril  10 mg Oral Daily  . senna-docusate  1 tablet Oral BID  . simvastatin  20 mg Oral QHS  . sodium chloride  3 mL Intravenous Q12H  . sodium chloride  3 mL Intravenous Q12H    Objective: Vital signs in last 24 hours: Temp:  [98.6 F (37 C)-99.6 F (37.6 C)] 98.6 F (37 C) (12/02 0811) Pulse Rate:  [69-76] 69 (12/02 0811) Resp:  [16-25] 18 (12/02 0811) BP: (124-175)/(55-111) 164/66 mmHg (12/02 0811) SpO2:  [94 %-99 %] 94 % (12/02 0811) Weight:  [71.636 kg (157 lb 14.9 oz)] 71.636 kg (157 lb 14.9 oz) (12/02 0500) Constitutional: oriented to person, place, and time. appears well-developed and well-nourished. No distress.  HENT: [place]/AT, PERRLA, no  scleral icterus Mouth/Throat: Oropharynx is clear and moist. No oropharyngeal exudate.  Cardiovascular: Normal rate, regular rhythm and normal heart sounds. Pulmonary/Chest: Effort normal and breath sounds normal. No respiratory distress. has no wheezes.  Abdominal: Soft. Bowel sounds are normal. exhibits no distension. There is no tenderness.  Lymphadenopathy: no cervical adenopathy. No axillary adenopathy Neurological: alert and oriented to person, place, and time.  Skin: R foot wrapped post op Lab Results  Recent Labs  04/02/15 2230 04/04/15 0619 04/05/15 0537  HGB 7.4*  --   --   HCT 22.2*  --   --   NA  --   --  139  K  --   --  3.7  CL  --   --  114*  CO2  --   --  20*  BUN  --   --  35*  CREATININE  --  1.30* 1.40*    Microbiology:  Results for orders placed or performed during the hospital encounter of 04/02/15  Wound culture     Status: None  (Preliminary result)   Collection Time: 04/02/15  1:50 AM  Result Value Ref Range Status   Specimen Description ULCER  Final   Special Requests Normal  Final   Gram Stain   Final    MODERATE WBC SEEN RARE GRAM POSITIVE COCCI IN PAIRS RARE GRAM VARIABLE ROD    Culture   Final    MODERATE GROWTH STENOTROPHOMONAS MALTOPHILIA SENDING TO [facility] FOR FURTHER SUSCEPTIBILITY TESTING PER DR. Joesph Marcy    Report Status PENDING  Incomplete   Organism ID, Bacteria STENOTROPHOMONAS MALTOPHILIA  Final      Susceptibility   Stenotrophomonas maltophilia - MIC*    LEVOFLOXACIN >=8 RESISTANT Resistant     TRIMETH/SULFA 80 RESISTANT Resistant     * MODERATE GROWTH STENOTROPHOMONAS MALTOPHILIA  Surgical pcr screen     Status: None   Collection Time: 04/02/15 12:34 PM  Result Value Ref Range Status   MRSA, PCR NEGATIVE NEGATIVE Final   Staphylococcus aureus NEGATIVE NEGATIVE Final    Comment:        The Xpert SA Assay (FDA approved for NASAL specimens in patients over 45 years of age), is one component of a comprehensive surveillance program.  Test performance has been validated by [facility] for patients greater than or equal to 36 year old. It is not intended to diagnose infection nor to guide or monitor treatment.   Culture, blood (routine x 2)     Status: None (Preliminary result)   Collection Time: 04/02/15  1:50 PM  Result Value Ref Range Status   Specimen Description BLOOD RIGHT HAND  Final   Special Requests BOTTLES DRAWN AEROBIC AND ANAEROBIC 4CC  Final   Culture NO GROWTH 2 DAYS  Final   Report Status PENDING  Incomplete  Culture, blood (routine x 2)     Status: None (Preliminary result)   Collection Time: 04/02/15  2:08 PM  Result Value Ref Range Status   Specimen Description BLOOD LEFT ASSIST CONTROL  Final   Special Requests BOTTLES DRAWN AEROBIC AND ANAEROBIC 4CC  Final   Culture NO GROWTH 2 DAYS  Final   Report Status PENDING  Incomplete   03/01/15    110moago     Specimen Description TOE   Special Requests NONE   Gram Stain FEW WBC SEEN  FEW YEAST  FEW GRAM NEGATIVE RODS  RARE GRAM POSITIVE RODS       Culture MODERATE GROWTH STENOTROPHOMONAS MALTOPHILIA  Report Status 03/04/2015 FINAL   Organism ID, Bacteria STENOTROPHOMONAS MALTOPHILIA   Resulting Agency [facility]    Culture & Susceptibility      STENOTROPHOMONAS MALTOPHILIA     Antibiotic Sensitivity Microscan Status    LEVOFLOXACIN Sensitive 0.5 SENSITIVE Final    Method: MIC    TRIMETH/SULFA Sensitive <=20 SENSITIVE Final    Method: MIC          Culture HEAVY GROWTH ESCHERICHIA COLI  HEAVY GROWTH PROVIDENCIA STUARTII  MODERATE GROWTH MORGANELLA MORGANII  MODERATE GROWTH ENTEROCOCCUS FAECALIS  Susceptibility Pattern Suggests Possibility of an Extended Spectrum Beta Lactamase Producer. Contact Laboratory Within 7 Days if Confirmation Warranted.       Report Status PENDING   Organism ID, Bacteria ESCHERICHIA COLI   Organism ID, Bacteria PROVIDENCIA STUARTII   Organism ID, Bacteria MORGANELLA MORGANII   Organism ID, Bacteria ENTEROCOCCUS FAECALIS   Resulting Agency [facility]    Culture & Susceptibility      PROVIDENCIA STUARTII     Antibiotic Sensitivity Microscan Status    AMPICILLIN Resistant >=32 RESISTANT Final    Method: MIC    CEFAZOLIN Resistant >=64 RESISTANT Final    Method: MIC    CEFTAZIDIME Sensitive <=1 SENSITIVE Final    Method: MIC    CEFTRIAXONE Sensitive <=1 SENSITIVE Final    Method: MIC    CIPROFLOXACIN Resistant >=4 RESISTANT Final    Method: MIC    GENTAMICIN Resistant 2 RESISTANT Final    Method: MIC    IMIPENEM Sensitive 2 SENSITIVE Final    Method: MIC    TRIMETH/SULFA Resistant >=320 RESISTANT Final     Method: MIC    Comments PROVIDENCIA STUARTII (MIC)    HEAVY GROWTH PROVIDENCIA STUARTII             ESCHERICHIA COLI     Antibiotic Sensitivity Microscan Status    AMPICILLIN Resistant >=32 RESISTANT Final    Method: MIC    CEFAZOLIN Resistant >=64 RESISTANT Final    Method: MIC    CEFTAZIDIME Resistant 16 RESISTANT Final    Method: MIC    CEFTRIAXONE Resistant >=64 RESISTANT Final    Method: MIC    CIPROFLOXACIN Resistant >=4 RESISTANT Final    Method: MIC    Extended ESBL Resistant POSITIVE Final    Method: MIC    GENTAMICIN Sensitive <=1 SENSITIVE Final    Method: MIC    IMIPENEM Sensitive <=0.25 SENSITIVE Final    Method: MIC    TRIMETH/SULFA Resistant >=320 RESISTANT Final    Method: MIC    Comments ESCHERICHIA COLI (MIC)    HEAVY GROWTH ESCHERICHIA COLI             MORGANELLA MORGANII     Antibiotic Sensitivity Microscan Status    AMPICILLIN Resistant RESISTANT Final    Method: MIC    CEFAZOLIN Resistant RESISTANT Final    Method: MIC    CEFEPIME Sensitive SENSITIVE Final    Method: MIC    CEFTAZIDIME Resistant RESISTANT Final    Method: MIC    CEFTRIAXONE Sensitive SENSITIVE Final    Method: MIC    CIPROFLOXACIN Resistant RESISTANT Final    Method: MIC    GENTAMICIN Resistant RESISTANT Final    Method: MIC    IMIPENEM Sensitive SENSITIVE Final    Method: MIC    TRIMETH/SULFA Resistant RESISTANT Final    Method: MIC    Comments MORGANELLA MORGANII (MIC)    MODERATE GROWTH MORGANELLA MORGANII  ENTEROCOCCUS FAECALIS     Antibiotic Sensitivity Microscan Status    AMPICILLIN Sensitive  SENSITIVE Final    Method: MIC    LINEZOLID Sensitive SENSITIVE Final    Method: MIC    Comments ENTEROCOCCUS FAECALIS (MIC)    MODERATE GROWTH ENTEROCOCCUS FAECALIS                   Studies/Results: Dg Chest Port 1 View  04/05/2015  CLINICAL DATA:  New PICC line on the left. EXAM: PORTABLE CHEST 1 VIEW COMPARISON:  02/02/2015 FINDINGS: Left-sided PICC line tip overlies the level of the lower superior vena cava. Right-sided PICC line has been removed. The heart is enlarged. There is left basilar opacity obscuring the hemidiaphragm. Left pleural effusion is present. There is mild hazy density within the right upper lobe, stable. IMPRESSION: Interval placement of left-sided PICC line. Significant left base opacity and pleural effusion. Electronically Signed   By: Nolon Nations M.D.   On: 04/05/2015 13:36   X-ray: 04/02/15 Right foot x-rays are questionable for osteomyelitis of the 1st metatarsal head. No obvious erosive changes but there is concern especially on the oblique view. She does have noted diffuse osteopenia.  Assessment:  [name] is a 69 y.o. female with recent amputation of R great toe 10/28 now with new medial ulcer and possible underlying osteomyelitis. She had abundant thin ss drainage from the site of amputation and the medial wound. HIV neg, ESR 37, alb very low at 1.7/  CRP 5.0.   S/p I and D 11/30 and angioplasty of R tibio trunk and peroneal artery 12/1 Wound cx growing MDR stentotrophomonas- I have asked lab to send for sensitivity testing at [place] but this will take 4-5 days for result  Recommendations Place picc Cont ceftazidime and minocycline Will plan 4-6 weeks of treatment- Abx order sheet filled out  Thank you very much for the consult. Will follow with you.  [place], [place]   04/05/2015, 3:34 PM

## 2015-04-06 LAB — CBC
HCT: 22.8 % — ABNORMAL LOW (ref 35.0–47.0)
Hemoglobin: 7.2 g/dL — ABNORMAL LOW (ref 12.0–16.0)
MCH: 26.9 pg (ref 26.0–34.0)
MCHC: 31.7 g/dL — AB (ref 32.0–36.0)
MCV: 85 fL (ref 80.0–100.0)
PLATELETS: 237 10*3/uL (ref 150–440)
RBC: 2.68 MIL/uL — AB (ref 3.80–5.20)
RDW: 15.8 % — AB (ref 11.5–14.5)
WBC: 4.9 10*3/uL (ref 3.6–11.0)

## 2015-04-06 LAB — GLUCOSE, CAPILLARY
GLUCOSE-CAPILLARY: 107 mg/dL — AB (ref 65–99)
GLUCOSE-CAPILLARY: 162 mg/dL — AB (ref 65–99)
Glucose-Capillary: 134 mg/dL — ABNORMAL HIGH (ref 65–99)
Glucose-Capillary: 139 mg/dL — ABNORMAL HIGH (ref 65–99)

## 2015-04-06 LAB — BASIC METABOLIC PANEL WITH GFR
Anion gap: 3 — ABNORMAL LOW (ref 5–15)
BUN: 39 mg/dL — ABNORMAL HIGH (ref 6–20)
CO2: 20 mmol/L — ABNORMAL LOW (ref 22–32)
Calcium: 7.2 mg/dL — ABNORMAL LOW (ref 8.9–10.3)
Chloride: 117 mmol/L — ABNORMAL HIGH (ref 101–111)
Creatinine, Ser: 1.43 mg/dL — ABNORMAL HIGH (ref 0.44–1.00)
GFR calc Af Amer: 42 mL/min — ABNORMAL LOW
GFR calc non Af Amer: 36 mL/min — ABNORMAL LOW
Glucose, Bld: 112 mg/dL — ABNORMAL HIGH (ref 65–99)
Potassium: 4 mmol/L (ref 3.5–5.1)
Sodium: 140 mmol/L (ref 135–145)

## 2015-04-06 NOTE — Progress Notes (Signed)
[place] [facility] [place] NAME: [name]    MR#:  [id number]  DATE OF BIRTH:  15-Nov-1945  SUBJECTIVE:  CHIEF COMPLAINT: leg swelling and pain on right side.  s/p debridement surgery 04/03/15.   No complaint.  REVIEW OF SYSTEMS:  CONSTITUTIONAL: No fever, fatigue or weakness.  EYES: No blurred or double vision.  EARS, NOSE, AND THROAT: No tinnitus or ear pain.  RESPIRATORY: No cough, shortness of breath, wheezing or hemoptysis.  CARDIOVASCULAR: No chest pain, orthopnea, edema.  GASTROINTESTINAL: No nausea, vomiting, diarrhea or abdominal pain.  GENITOURINARY: No dysuria, hematuria.  ENDOCRINE: No polyuria, nocturia,  HEMATOLOGY: No anemia, easy bruising or bleeding SKIN: No rash or lesion. MUSCULOSKELETAL: No joint pain or arthritis.   NEUROLOGIC: No tingling, numbness, weakness.  PSYCHIATRY: No anxiety or depression.   ROS  DRUG ALLERGIES:   Allergies  Allergen Reactions  . Codeine Other (See Comments)    Reaction:  Hallucinations    VITALS:  Blood pressure 184/58, pulse 64, temperature 98.4 F (36.9 C), temperature source Oral, resp. rate 18, height 5\' 7"  (1.702 m), weight 73.573 kg (162 lb 3.2 oz), SpO2 96 %.  PHYSICAL EXAMINATION:  GENERAL:  69 y.o.-year-old patient lying in the bed with no acute distress.  EYES: Pupils equal, round, reactive to light and accommodation. No scleral icterus. Extraocular muscles intact.  HEENT: Head atraumatic, normocephalic. Oropharynx and nasopharynx clear.  NECK:  Supple, no jugular venous distention. No thyroid enlargement, no tenderness.  LUNGS: Normal breath sounds bilaterally, no wheezing, rales,rhonchi or crepitation. No use of accessory muscles of respiration.  CARDIOVASCULAR: S1, S2 normal. No murmurs, rubs, or gallops.  ABDOMEN: Soft, nontender, nondistended. Bowel sounds present. No organomegaly or mass.  EXTREMITIES: right leg pedal edema, no tenderness, no cyanosis, or  clubbing. dressing present on right foot. NEUROLOGIC: Cranial nerves II through XII are intact. Muscle strength 5/5 in all extremities. Sensation intact. Gait not checked.  PSYCHIATRIC: The patient is alert and oriented x 3.  SKIN: No obvious rash, lesion, or ulcer.   Physical Exam LABORATORY PANEL:   CBC  Recent Labs Lab 04/06/15 0533  WBC 4.9  HGB 7.2*  HCT 22.8*  PLT 237   ------------------------------------------------------------------------------------------------------------------  Chemistries   Recent Labs Lab 04/02/15 1350  04/06/15 0533  NA 138  < > 140  K 4.1  < > 4.0  CL 114*  < > 117*  CO2 20*  < > 20*  GLUCOSE 137*  < > 112*  BUN 33*  < > 39*  CREATININE 1.25*  < > 1.43*  CALCIUM 7.4*  < > 7.2*  AST 24  --   --   ALT 9*  --   --   ALKPHOS 68  --   --   BILITOT <0.1*  --   --   < > = values in this interval not displayed. ------------------------------------------------------------------------------------------------------------------  Cardiac Enzymes No results for input(s): TROPONINI in the last 168 hours. ------------------------------------------------------------------------------------------------------------------  RADIOLOGY:  Dg Chest Port 1 View  04/05/2015  CLINICAL DATA:  New PICC line on the left. EXAM: PORTABLE CHEST 1 VIEW COMPARISON:  02/02/2015 FINDINGS: Left-sided PICC line tip overlies the level of the lower superior vena cava. Right-sided PICC line has been removed. The heart is enlarged. There is left basilar opacity obscuring the hemidiaphragm. Left pleural effusion is present. There is mild hazy density within the right upper lobe, stable. IMPRESSION: Interval placement of left-sided PICC line. Significant left base opacity  and pleural effusion. Electronically Signed   By: Nolon Nations M.D.   On: 04/05/2015 13:36    ASSESSMENT AND PLAN:   Active Problems:   Pressure ulcer   Diabetic osteomyelitis ([facility])   Osteomyelitis of  ankle or foot ([place])  * Right foot osteomyelitis Consulted podiatry and ID. IV vanc and Zosyn. S/p debridement sx 04/03/15. PICC placed 04/05/15 Bcx x 2 ID changed Abx to ceftazidime and minocycline. Continue for 4 weeks per Dr. Ola Spurr.   * Acute renal failure   Worsening, continue IV fluids and follow-up BMP.  * HTN, uncontrolled Added Clonidine and restarted home meds. IV hydralazine when necessary.  * anemia of chronic disease. Hemoglobin decreased.   Continue on iron supplements. Follow-up hemoglobin.  * DM SSI, ADA, glimepiride.  * DVT prophylaxis Lovenox Negative right leg Doppler for DVT.      All the records are reviewed and case discussed with Care Management/Social Workerr. Management plans discussed with the patient, family and they are in agreement. Greater than 50% time was spent on coordination of care and face-to-face counseling.  CODE STATUS: full  TOTAL TIME TAKING CARE OF THIS PATIENT: 38 minutes.  Waiting for renal func to improve.  POSSIBLE D/C TO HOME with home health IN 2 DAYS, DEPENDING ON CLINICAL CONDITION.   Demetrios Loll M.D on 04/06/2015   Between 7am to 6pm - Pager - [phone]  After 6pm go to [email or web address] - password [email or web address] [facility]  Office  [phone]  CC: Primary care physician; Morton Peters, MD  Note: This dictation was prepared with Dragon dictation along with smaller phrase technology. Any transcriptional errors that result from this process are unintentional.

## 2015-04-06 NOTE — Progress Notes (Signed)
2 Days Post-Op  Subjective: Patient seen. No complaints.  Objective: Vital signs in last 24 hours: Temp:  [98.3 F (36.8 C)-99.1 F (37.3 C)] 98.4 F (36.9 C) (12/03 0818) Pulse Rate:  [61-70] 64 (12/03 0818) Resp:  [18-20] 18 (12/03 0818) BP: (128-184)/(45-64) 184/58 mmHg (12/03 0818) SpO2:  [95 %-98 %] 96 % (12/03 0818) Weight:  [73.573 kg (162 lb 3.2 oz)] 73.573 kg (162 lb 3.2 oz) (12/03 0541) Last BM Date: 04/04/15  Intake/Output from previous day: 12/02 0701 - 12/03 0700 In: 835.5 [I.V.:735.5; IV Piggyback:100] Out: 250 [Urine:250] Intake/Output this shift: Total I/O In: 517.5 [I.V.:517.5] Out: -   The bandages dry and intact. No evidence of any strikethrough  Lab Results:   Recent Labs  04/06/15 0533  WBC 4.9  HGB 7.2*  HCT 22.8*  PLT 237   BMET  Recent Labs  04/05/15 0537 04/06/15 0533  NA 139 140  K 3.7 4.0  CL 114* 117*  CO2 20* 20*  GLUCOSE 111* 112*  BUN 35* 39*  CREATININE 1.40* 1.43*  CALCIUM 7.2* 7.2*   PT/INR No results for input(s): LABPROT, INR in the last 72 hours. ABG No results for input(s): PHART, HCO3 in the last 72 hours.  Invalid input(s): PCO2, PO2  Studies/Results: Dg Chest Port 1 View  04/05/2015  CLINICAL DATA:  New PICC line on the left. EXAM: PORTABLE CHEST 1 VIEW COMPARISON:  02/02/2015 FINDINGS: Left-sided PICC line tip overlies the level of the lower superior vena cava. Right-sided PICC line has been removed. The heart is enlarged. There is left basilar opacity obscuring the hemidiaphragm. Left pleural effusion is present. There is mild hazy density within the right upper lobe, stable. IMPRESSION: Interval placement of left-sided PICC line. Significant left base opacity and pleural effusion. Electronically Signed   By: Nolon Nations M.D.   On: 04/05/2015 13:36    Anti-infectives: Anti-infectives    Start     Dose/Rate Route Frequency Ordered Stop   04/04/15 1645  cefTAZidime (FORTAZ) 1 g in dextrose 5 % 50 mL IVPB      1 g 100 mL/hr over 30 Minutes Intravenous  Once 04/04/15 1634 04/04/15 1704   04/04/15 1130  minocycline (MINOCIN,DYNACIN) capsule 100 mg     100 mg Oral 2 times daily 04/04/15 1115     04/04/15 1130  cefTAZidime (FORTAZ) 1 g in dextrose 5 % 50 mL IVPB     1 g 100 mL/hr over 30 Minutes Intravenous Every 12 hours 04/04/15 1121     04/03/15 1700  vancomycin (VANCOCIN) IVPB 1000 mg/200 mL premix  Status:  Discontinued     1,000 mg 200 mL/hr over 60 Minutes Intravenous Every 24 hours 04/02/15 1529 04/04/15 1115   04/02/15 1700  piperacillin-tazobactam (ZOSYN) IVPB 3.375 g  Status:  Discontinued     3.375 g 12.5 mL/hr over 240 Minutes Intravenous 3 times per day 04/02/15 1529 04/04/15 1115   04/02/15 1345  vancomycin (VANCOCIN) 1,500 mg in sodium chloride 0.9 % 500 mL IVPB     1,500 mg 250 mL/hr over 120 Minutes Intravenous  Once 04/02/15 1337 04/02/15 2221   04/02/15 1345  piperacillin-tazobactam (ZOSYN) IVPB 3.375 g  Status:  Discontinued     3.375 g 12.5 mL/hr over 240 Minutes Intravenous  Once 04/02/15 1337 04/02/15 1529      Assessment/Plan: s/p Procedure(s): Abdominal Aortogram w/Lower Extremity (N/A) Lower Extremity Intervention Plan: Dressing left intact. Plan on changing tomorrow or Monday before discharge. Follow-up weekly in the office  after discharge.  LOS: 4 days    Krystal Belair W. 04/06/2015

## 2015-04-07 LAB — BASIC METABOLIC PANEL
Anion gap: 3 — ABNORMAL LOW (ref 5–15)
BUN: 41 mg/dL — ABNORMAL HIGH (ref 6–20)
CHLORIDE: 116 mmol/L — AB (ref 101–111)
CO2: 19 mmol/L — ABNORMAL LOW (ref 22–32)
CREATININE: 1.54 mg/dL — AB (ref 0.44–1.00)
Calcium: 7.3 mg/dL — ABNORMAL LOW (ref 8.9–10.3)
GFR, EST AFRICAN AMERICAN: 38 mL/min — AB (ref >60)
GFR, EST NON AFRICAN AMERICAN: 33 mL/min — AB (ref >60)
Glucose, Bld: 111 mg/dL — ABNORMAL HIGH (ref 65–99)
POTASSIUM: 4 mmol/L (ref 3.5–5.1)
SODIUM: 138 mmol/L (ref 135–145)

## 2015-04-07 LAB — GLUCOSE, CAPILLARY
GLUCOSE-CAPILLARY: 132 mg/dL — AB (ref 65–99)
Glucose-Capillary: 103 mg/dL — ABNORMAL HIGH (ref 65–99)
Glucose-Capillary: 152 mg/dL — ABNORMAL HIGH (ref 65–99)
Glucose-Capillary: 153 mg/dL — ABNORMAL HIGH (ref 65–99)

## 2015-04-07 LAB — CULTURE, BLOOD (ROUTINE X 2)
CULTURE: NO GROWTH
Culture: NO GROWTH

## 2015-04-07 LAB — CBC
HEMATOCRIT: 22.3 % — AB (ref 35.0–47.0)
Hemoglobin: 7.1 g/dL — ABNORMAL LOW (ref 12.0–16.0)
MCH: 27.2 pg (ref 26.0–34.0)
MCHC: 31.9 g/dL — ABNORMAL LOW (ref 32.0–36.0)
MCV: 85.1 fL (ref 80.0–100.0)
PLATELETS: 258 10*3/uL (ref 150–440)
RBC: 2.61 MIL/uL — AB (ref 3.80–5.20)
RDW: 16.4 % — ABNORMAL HIGH (ref 11.5–14.5)
WBC: 3.6 10*3/uL (ref 3.6–11.0)

## 2015-04-07 LAB — PREPARE RBC (CROSSMATCH)

## 2015-04-07 MED ORDER — FUROSEMIDE 10 MG/ML IJ SOLN
20.0000 mg | Freq: Once | INTRAMUSCULAR | Status: AC
  Administered 2015-04-07: 20 mg via INTRAVENOUS
  Filled 2015-04-07: qty 2

## 2015-04-07 MED ORDER — FUROSEMIDE 40 MG PO TABS
40.0000 mg | ORAL_TABLET | Freq: Every day | ORAL | Status: DC
  Administered 2015-04-08: 40 mg via ORAL
  Filled 2015-04-07: qty 1

## 2015-04-07 MED ORDER — SODIUM CHLORIDE 0.9 % IV SOLN
Freq: Once | INTRAVENOUS | Status: AC
  Administered 2015-04-07: 15:00:00 via INTRAVENOUS

## 2015-04-07 MED ORDER — HEPARIN SODIUM (PORCINE) 5000 UNIT/ML IJ SOLN
5000.0000 [IU] | Freq: Three times a day (TID) | INTRAMUSCULAR | Status: DC
  Administered 2015-04-07 – 2015-04-08 (×3): 5000 [IU] via SUBCUTANEOUS
  Filled 2015-04-07 (×3): qty 1

## 2015-04-07 MED ORDER — CLONIDINE HCL 0.1 MG PO TABS
0.2000 mg | ORAL_TABLET | Freq: Three times a day (TID) | ORAL | Status: DC
  Administered 2015-04-07 – 2015-04-08 (×4): 0.2 mg via ORAL
  Filled 2015-04-07 (×4): qty 2

## 2015-04-07 NOTE — Progress Notes (Signed)
3 Days Post-Op  Subjective: Patient seen. States she is going home today. No complaints  Objective: Vital signs in last 24 hours: Temp:  [98.3 F (36.8 C)-98.9 F (37.2 C)] 98.3 F (36.8 C) (12/04 0821) Pulse Rate:  [62-67] 64 (12/04 0821) Resp:  [18] 18 (12/04 0821) BP: (142-180)/(51-68) 170/68 mmHg (12/04 0826) SpO2:  [95 %-97 %] 97 % (12/04 0821) Weight:  [73.483 kg (162 lb)] 73.483 kg (162 lb) (12/04 0500) Last BM Date: 04/04/15  Intake/Output from previous day: 12/03 0701 - 12/04 0700 In: 2255.3 [I.V.:2155.3; IV Piggyback:100] Out: 500 [Urine:500] Intake/Output this shift:    The bandages dry and intact. Upon removal there is a mild amount of bleeding from the incisions. The incisions are well coapted with no signs of dehiscence or devitalized skin. No purulence  Lab Results:   Recent Labs  04/06/15 0533 04/07/15 0451  WBC 4.9 3.6  HGB 7.2* 7.1*  HCT 22.8* 22.3*  PLT 237 258   BMET  Recent Labs  04/06/15 0533 04/07/15 0451  NA 140 138  K 4.0 4.0  CL 117* 116*  CO2 20* 19*  GLUCOSE 112* 111*  BUN 39* 41*  CREATININE 1.43* 1.54*  CALCIUM 7.2* 7.3*   PT/INR No results for input(s): LABPROT, INR in the last 72 hours. ABG No results for input(s): PHART, HCO3 in the last 72 hours.  Invalid input(s): PCO2, PO2  Studies/Results: Dg Chest Port 1 View  04/05/2015  CLINICAL DATA:  New PICC line on the left. EXAM: PORTABLE CHEST 1 VIEW COMPARISON:  02/02/2015 FINDINGS: Left-sided PICC line tip overlies the level of the lower superior vena cava. Right-sided PICC line has been removed. The heart is enlarged. There is left basilar opacity obscuring the hemidiaphragm. Left pleural effusion is present. There is mild hazy density within the right upper lobe, stable. IMPRESSION: Interval placement of left-sided PICC line. Significant left base opacity and pleural effusion. Electronically Signed   By: Nolon Nations M.D.   On: 04/05/2015 13:36     Anti-infectives: Anti-infectives    Start     Dose/Rate Route Frequency Ordered Stop   04/04/15 1645  cefTAZidime (FORTAZ) 1 g in dextrose 5 % 50 mL IVPB     1 g 100 mL/hr over 30 Minutes Intravenous  Once 04/04/15 1634 04/04/15 1704   04/04/15 1130  minocycline (MINOCIN,DYNACIN) capsule 100 mg     100 mg Oral 2 times daily 04/04/15 1115     04/04/15 1130  cefTAZidime (FORTAZ) 1 g in dextrose 5 % 50 mL IVPB     1 g 100 mL/hr over 30 Minutes Intravenous Every 12 hours 04/04/15 1121     04/03/15 1700  vancomycin (VANCOCIN) IVPB 1000 mg/200 mL premix  Status:  Discontinued     1,000 mg 200 mL/hr over 60 Minutes Intravenous Every 24 hours 04/02/15 1529 04/04/15 1115   04/02/15 1700  piperacillin-tazobactam (ZOSYN) IVPB 3.375 g  Status:  Discontinued     3.375 g 12.5 mL/hr over 240 Minutes Intravenous 3 times per day 04/02/15 1529 04/04/15 1115   04/02/15 1345  vancomycin (VANCOCIN) 1,500 mg in sodium chloride 0.9 % 500 mL IVPB     1,500 mg 250 mL/hr over 120 Minutes Intravenous  Once 04/02/15 1337 04/02/15 2221   04/02/15 1345  piperacillin-tazobactam (ZOSYN) IVPB 3.375 g  Status:  Discontinued     3.375 g 12.5 mL/hr over 240 Minutes Intravenous  Once 04/02/15 1337 04/02/15 1529      Assessment/Plan: s/p Procedure(s): Abdominal  Aortogram w/Lower Extremity (N/A) Lower Extremity Intervention Assessment: Osteomyelitis status post debridement and amputation   Plan: Betadine and a sterile bandage reapplied to the right foot. Patient was instructed to leave this intact and not to remove at home. From a foot standpoint she is stable for discharge and recommend follow-up at the end of this week or first part of next week  LOS: 5 days    Kourtnie Sachs W. 04/07/2015

## 2015-04-07 NOTE — Progress Notes (Signed)
[facility] [place] NAME: [name]    MR#:  [id number]  DATE OF BIRTH:  [birth year]  SUBJECTIVE:  CHIEF COMPLAINT: leg swelling and pain on right side.  s/p debridement surgery 04/03/15.    poor appetite and weakness.  REVIEW OF SYSTEMS:  CONSTITUTIONAL: No fever, generalized weakness.  EYES: No blurred or double vision.  EARS, NOSE, AND THROAT: No tinnitus or ear pain.  RESPIRATORY: No cough, shortness of breath, wheezing or hemoptysis.  CARDIOVASCULAR: No chest pain, orthopnea, edema.  GASTROINTESTINAL: No nausea, vomiting, diarrhea or abdominal pain.  GENITOURINARY: No dysuria, hematuria.  ENDOCRINE: No polyuria, nocturia,  HEMATOLOGY: No anemia, easy bruising or bleeding SKIN: No rash or lesion. MUSCULOSKELETAL: No joint pain or arthritis.   NEUROLOGIC: No tingling, numbness, weakness.  PSYCHIATRY: No anxiety or depression.   ROS  DRUG ALLERGIES:   Allergies  Allergen Reactions  . Codeine Other (See Comments)    Reaction:  Hallucinations    VITALS:  Blood pressure 170/68, pulse 64, temperature 98.3 F (36.8 C), temperature source Oral, resp. rate 18, height 5\' 7"  (1.702 m), weight 73.483 kg (162 lb), SpO2 97 %.  PHYSICAL EXAMINATION:  GENERAL:  69 y.o.-year-old patient lying in the bed with no acute distress.  EYES: Pupils equal, round, reactive to light and accommodation. No scleral icterus. Extraocular muscles intact.  HEENT: Head atraumatic, normocephalic. Oropharynx and nasopharynx clear.  NECK:  Supple, no jugular venous distention. No thyroid enlargement, no tenderness.  LUNGS: Normal breath sounds bilaterally, no wheezing, rales,rhonchi or crepitation. No use of accessory muscles of respiration.  CARDIOVASCULAR: S1, S2 normal. No murmurs, rubs, or gallops.  ABDOMEN: Soft, nontender, nondistended. Bowel sounds present. No organomegaly or mass.  EXTREMITIES: right leg pedal edema, no tenderness, no  cyanosis, or clubbing. dressing present on right foot. NEUROLOGIC: Cranial nerves II through XII are intact. Muscle strength 4/5 in all extremities. Sensation intact. Gait not checked.  PSYCHIATRIC: The patient is alert and oriented x 3.  SKIN: No obvious rash, lesion, or ulcer.   Physical Exam LABORATORY PANEL:   CBC  Recent Labs Lab 04/07/15 0451  WBC 3.6  HGB 7.1*  HCT 22.3*  PLT 258   ------------------------------------------------------------------------------------------------------------------  Chemistries   Recent Labs Lab 04/02/15 1350  04/07/15 0451  NA 138  < > 138  K 4.1  < > 4.0  CL 114*  < > 116*  CO2 20*  < > 19*  GLUCOSE 137*  < > 111*  BUN 33*  < > 41*  CREATININE 1.25*  < > 1.54*  CALCIUM 7.4*  < > 7.3*  AST 24  --   --   ALT 9*  --   --   ALKPHOS 68  --   --   BILITOT <0.1*  --   --   < > = values in this interval not displayed. ------------------------------------------------------------------------------------------------------------------  Cardiac Enzymes No results for input(s): TROPONINI in the last 168 hours. ------------------------------------------------------------------------------------------------------------------  RADIOLOGY:  No results found.  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 S/p debridement sx 04/03/15. PICC placed 04/05/15 Wound care. ID changed Abx to ceftazidime and minocycline. Continue for 4 weeks per Dr. Ola Spurr.   * Acute renal failure   Worsening, continue IV fluids and follow-up BMP. Encourage oral intake.  * HTN,  uncontrolled Added and increase Clonidine, continue home meds. IV hydralazine when necessary.  * anemia of chronic disease. Hemoglobin decreased to 7.1/HCT 22.3. The patient has generalized weakness and is status post surgery. There is  indication for blood transfusion.   PRBC transfusion 1 unit today, Continue on iron supplements. Follow-up hemoglobin.  * DM SSI, ADA, hold  glimepiride.  * DVT prophylaxis D/c Lovenox and start heparin SQ. Negative right leg Doppler for DVT.      All the records are reviewed and case discussed with Care Management/Social Workerr. Management plans discussed with the patient, her husband and they are in agreement. Greater than 50% time was spent on coordination of care and face-to-face counseling.  CODE STATUS: full  TOTAL TIME TAKING CARE OF THIS PATIENT: 38 minutes.  Waiting for renal func to improve.  POSSIBLE D/C TO HOME with home health IN 2 DAYS, DEPENDING ON CLINICAL CONDITION.   Demetrios Loll M.D on 04/07/2015   Between 7am to 6pm - Pager - [phone]  After 6pm go to [email or web address] - password [email or web address] [facility]  Office  [phone]  CC: Primary care physician; Morton Peters, MD  Note: This dictation was prepared with Dragon dictation along with smaller phrase technology. Any transcriptional errors that result from this process are unintentional.

## 2015-04-08 LAB — CBC
HCT: 24.6 % — ABNORMAL LOW (ref 35.0–47.0)
Hemoglobin: 8.1 g/dL — ABNORMAL LOW (ref 12.0–16.0)
MCH: 27.8 pg (ref 26.0–34.0)
MCHC: 33.1 g/dL (ref 32.0–36.0)
MCV: 83.8 fL (ref 80.0–100.0)
PLATELETS: 236 10*3/uL (ref 150–440)
RBC: 2.93 MIL/uL — AB (ref 3.80–5.20)
RDW: 17.4 % — AB (ref 11.5–14.5)
WBC: 4.3 10*3/uL (ref 3.6–11.0)

## 2015-04-08 LAB — COMPREHENSIVE METABOLIC PANEL
ALT: 6 U/L — ABNORMAL LOW (ref 14–54)
AST: 15 U/L (ref 15–41)
Albumin: 1.3 g/dL — ABNORMAL LOW (ref 3.5–5.0)
Alkaline Phosphatase: 52 U/L (ref 38–126)
Anion gap: 3 — ABNORMAL LOW (ref 5–15)
BUN: 39 mg/dL — AB (ref 6–20)
CHLORIDE: 116 mmol/L — AB (ref 101–111)
CO2: 20 mmol/L — AB (ref 22–32)
Calcium: 7.3 mg/dL — ABNORMAL LOW (ref 8.9–10.3)
Creatinine, Ser: 1.45 mg/dL — ABNORMAL HIGH (ref 0.44–1.00)
GFR, EST AFRICAN AMERICAN: 41 mL/min — AB (ref >60)
GFR, EST NON AFRICAN AMERICAN: 36 mL/min — AB (ref >60)
Glucose, Bld: 112 mg/dL — ABNORMAL HIGH (ref 65–99)
POTASSIUM: 3.9 mmol/L (ref 3.5–5.1)
SODIUM: 139 mmol/L (ref 135–145)
Total Bilirubin: 0.6 mg/dL (ref 0.3–1.2)
Total Protein: 4.8 g/dL — ABNORMAL LOW (ref 6.5–8.1)

## 2015-04-08 LAB — TYPE AND SCREEN
ABO/RH(D): O POS
ANTIBODY SCREEN: NEGATIVE
Unit division: 0

## 2015-04-08 LAB — GLUCOSE, CAPILLARY
GLUCOSE-CAPILLARY: 104 mg/dL — AB (ref 65–99)
GLUCOSE-CAPILLARY: 134 mg/dL — AB (ref 65–99)
Glucose-Capillary: 124 mg/dL — ABNORMAL HIGH (ref 65–99)

## 2015-04-08 MED ORDER — MINOCYCLINE HCL 100 MG PO CAPS
100.0000 mg | ORAL_CAPSULE | Freq: Two times a day (BID) | ORAL | Status: DC
Start: 2015-04-08 — End: 2015-06-10

## 2015-04-08 MED ORDER — MINOCYCLINE HCL 100 MG PO CAPS: 100.0000 mg | ORAL_CAPSULE | Freq: Two times a day (BID) | ORAL | Status: DC

## 2015-04-08 MED ORDER — CEFTAZIDIME 1 G IJ SOLR: 1.0000 g | Freq: Two times a day (BID) | INTRAMUSCULAR | Status: DC

## 2015-04-08 MED ORDER — DEXTROSE 5 % IV SOLN: 1.0000 g | Freq: Two times a day (BID) | INTRAVENOUS | Status: DC

## 2015-04-08 MED ORDER — CLONIDINE HCL 0.2 MG PO TABS: 0.2000 mg | ORAL_TABLET | Freq: Three times a day (TID) | ORAL | Status: AC

## 2015-04-08 NOTE — Progress Notes (Signed)
ANTIBIOTIC CONSULT NOTE - Follow up  Pharmacy Consult for Ceftazidime Indication: osteomyelitis  Allergies  Allergen Reactions  . Codeine Other (See Comments)    Reaction:  Hallucinations    Patient Measurements: Height: 5\' 7"  (170.2 cm) Weight: 169 lb 3.2 oz (76.749 kg) IBW/kg (Calculated) : 61.6  Vital Signs: Temp: 97.8 F (36.6 C) (12/05 0746) Temp Source: Oral (12/05 0746) BP: 178/70 mmHg (12/05 0746) Pulse Rate: 62 (12/05 0746) Intake/Output from previous day: 12/04 0701 - 12/05 0700 In: 1613.3 [I.V.:1213.3; Blood:300; IV Piggyback:100] Out: 200 [Urine:200] Intake/Output from this shift:    Labs:  Recent Labs  04/06/15 0533 04/07/15 0451 04/08/15 0500  WBC 4.9 3.6 4.3  HGB 7.2* 7.1* 8.1*  PLT 237 258 236  CREATININE 1.43* 1.54* 1.45*   Estimated Creatinine Clearance: 38.5 mL/min (by C-G formula based on Cr of 1.45). No results for input(s): VANCOTROUGH, VANCOPEAK, VANCORANDOM, GENTTROUGH, GENTPEAK, GENTRANDOM, TOBRATROUGH, TOBRAPEAK, TOBRARND, AMIKACINPEAK, AMIKACINTROU, AMIKACIN in the last 72 hours.   Microbiology: Recent Results (from the past 720 hour(s))  Wound culture     Status: None (Preliminary result)   Collection Time: 04/02/15  1:50 AM  Result Value Ref Range Status   Specimen Description ULCER  Final   Special Requests Normal  Final   Gram Stain   Final    MODERATE WBC SEEN RARE GRAM POSITIVE COCCI IN PAIRS RARE GRAM VARIABLE ROD    Culture   Final    MODERATE GROWTH STENOTROPHOMONAS MALTOPHILIA SENDING TO [facility] FOR FURTHER SUSCEPTIBILITY TESTING PER DR. FITZGERALD    Report Status PENDING  Incomplete   Organism ID, Bacteria STENOTROPHOMONAS MALTOPHILIA  Final      Susceptibility   Stenotrophomonas maltophilia - MIC*    LEVOFLOXACIN >=8 RESISTANT Resistant     TRIMETH/SULFA 80 RESISTANT Resistant     * MODERATE GROWTH STENOTROPHOMONAS MALTOPHILIA  Surgical pcr screen     Status: None   Collection Time: 04/02/15 12:34 PM  Result  Value Ref Range Status   MRSA, PCR NEGATIVE NEGATIVE Final   Staphylococcus aureus NEGATIVE NEGATIVE Final    Comment:        The Xpert SA Assay (FDA approved for NASAL specimens in patients over 64 years of age), is one component of a comprehensive surveillance program.  Test performance has been validated by [facility] for patients greater than or equal to 45 year old. It is not intended to diagnose infection nor to guide or monitor treatment.   Culture, blood (routine x 2)     Status: None   Collection Time: 04/02/15  1:50 PM  Result Value Ref Range Status   Specimen Description BLOOD RIGHT HAND  Final   Special Requests BOTTLES DRAWN AEROBIC AND ANAEROBIC 4CC  Final   Culture NO GROWTH 5 DAYS  Final   Report Status 04/07/2015 FINAL  Final  Culture, blood (routine x 2)     Status: None   Collection Time: 04/02/15  2:08 PM  Result Value Ref Range Status   Specimen Description BLOOD LEFT ASSIST CONTROL  Final   Special Requests BOTTLES DRAWN AEROBIC AND ANAEROBIC 4CC  Final   Culture NO GROWTH 5 DAYS  Final   Report Status 04/07/2015 FINAL  Final    Medical History: Past Medical History  Diagnosis Date  . Hypertension   . Diabetes mellitus without complication ([place])   . Anemia   . Coronary artery disease   . CHF (congestive heart failure) ([place])   . Glaucoma   . Hyperlipidemia   .  Chronic kidney disease   . Neuropathy ([place])   . Chronic anemia   . Stroke ([place])   . Anginal pain ([place])   . Arthritis   . Retinopathy   . Cardiomyopathy ([place])   . Carotid artery stenosis   . Hoarseness, chronic   . GERD (gastroesophageal reflux disease)   . Hemangioma of liver   . Weight loss, unintentional     Assessment: Pharmacy consulted to dose Ceftazidime in a 69 yo female with osteomyelitis s/p OR.  Patient's wound cultures growing stenotrophomonas maltophilia resistant to bactrim and levaquin.    SCr: 1.45, est CrCl~38.5 mL/min  Goal of Therapy:  Resolution of  infection  Plan:  Per ID, wound cultures sent to [facility] for additional testing for sensitivies.  Continue current orders for Ceftazidime 1 gm IV q12h based on renal function.  MD also ordered minocycline 100 mg po BID.  This dosing is acceptable for renal function.   Pharmacy will continue to follow.   Rayna Sexton L 04/08/2015,12:39 PM

## 2015-04-08 NOTE — Care Management Important Message (Signed)
Important Message  Patient Details  Name: VARA OSTERKAMP MRN: [id number] Date of Birth: [birth year]   Medicare Important Message Given:  Yes    [name], RN 04/08/2015, 9:40 AM

## 2015-04-08 NOTE — Care Management (Signed)
Patient discharging home today followed by [facility]. I have notified Corene Cornea with [facility]. Please consider timing of IV dose and home health nurse/delivery of Rx to patient's home.

## 2015-04-08 NOTE — Discharge Summary (Addendum)
[facility] [place] NAME: [name]    MR#:  [id number]  DATE OF BIRTH:  18-May-1945  DATE OF ADMISSION:  04/02/2015 ADMITTING PHYSICIAN: Hillary Bow, MD  DATE OF DISCHARGE: 04/08/2015  PRIMARY CARE PHYSICIAN: Morton Peters, MD    ADMISSION DIAGNOSIS:  Right Foot Osteomyelitis osteomyelititis right foot ulceration and infection RLE, suspected PAD   DISCHARGE DIAGNOSIS:  Right foot osteomyelitis Acute renal failure SECONDARY DIAGNOSIS:   Past Medical History  Diagnosis Date  . Hypertension   . Diabetes mellitus without complication ([place])   . Anemia   . Coronary artery disease   . CHF (congestive heart failure) ([place])   . Glaucoma   . Hyperlipidemia   . Chronic kidney disease   . Neuropathy ([place])   . Chronic anemia   . Stroke ([place])   . Anginal pain ([place])   . Arthritis   . Retinopathy   . Cardiomyopathy ([place])   . Carotid artery stenosis   . Hoarseness, chronic   . GERD (gastroesophageal reflux disease)   . Hemangioma of liver   . Weight loss, unintentional     HOSPITAL COURSE:   * Right foot osteomyelitis She was on IV vanc and Zosyn and got debridement sx 04/03/15. PICC was placed 04/05/15 Continue Wound care. ID changed Abx to ceftazidime and minocycline. Continue for 4 weeks until 12/28 per Dr. Ola Spurr.   * Acute renal failure  She was treated with IV fluids, renal function is better today. follow-up BMP as outpatient. Encourage oral intake.  * HTN,  Better controlled Added and increased Clonidine, continue home meds. Treated with IV hydralazine when necessary.  * anemia of chronic disease. Hemoglobin decreased to 7.1/HCT 22.3. The patient has generalized weakness and is status post surgery. There is indication for blood transfusion.  So she got PRBC transfusion 1 unit  yesterday, Continue on iron supplements. Hb is 8.1 today.  * DM On SSI, ADA,  resume glimepiride after  discharge.  DISCHARGE CONDITIONS:   Stable, discharge to home with home health and PT today.  CONSULTS OBTAINED:  Treatment Team:  Sharlotte Alamo, MD Adrian Prows, MD Sela Hua, PA-C Demetrios Loll, MD  DRUG ALLERGIES:   Allergies  Allergen Reactions  . Codeine Other (See Comments)    Reaction:  Hallucinations    DISCHARGE MEDICATIONS:   Current Discharge Medication List    START taking these medications   Details  cefTAZidime 1 g in dextrose 5 % 50 mL Inject 1 g into the vein every 12 (twelve) hours. Qty: 46 g, Refills: 0    cloNIDine (CATAPRES) 0.2 MG tablet Take 1 tablet (0.2 mg total) by mouth 3 (three) times daily. Qty: 90 tablet, Refills: 0    minocycline (MINOCIN,DYNACIN) 100 MG capsule Take 1 capsule (100 mg total) by mouth 2 (two) times daily. Qty: 46 capsule, Refills: 0      CONTINUE these medications which have NOT CHANGED   Details  acetaminophen (TYLENOL) 325 MG tablet Take 2 tablets (650 mg total) by mouth every 4 (four) hours as needed for mild pain (or temp > 99 F).    docusate sodium (COLACE) 100 MG capsule Take 1 capsule (100 mg total) by mouth 2 (two) times daily. Qty: 60 capsule, Refills: 11   Associated Diagnoses: Constipation, unspecified constipation type    feeding supplement, GLUCERNA SHAKE, (GLUCERNA SHAKE) LIQD Take 237 mLs by mouth 2 (two) times daily between meals. Qty: 60 Can, Refills:  0    ferrous sulfate 325 (65 FE) MG tablet Take 325 mg by mouth daily.    folic acid (FOLVITE) 1 MG tablet Take 1 tablet (1 mg total) by mouth daily. Qty: 60 tablet, Refills: 1    furosemide (LASIX) 40 MG tablet Take 1 tablet (40 mg total) by mouth 2 (two) times daily. Qty: 60 tablet, Refills: 5    glimepiride (AMARYL) 2 MG tablet Take 1 mg by mouth daily.    hydrALAZINE (APRESOLINE) 100 MG tablet Take 100 mg by mouth 3 (three) times daily.     !! HYDROcodone-acetaminophen (NORCO) 5-325 MG tablet Take 1 tablet by mouth every 6 (six) hours  as needed for moderate pain. Qty: 30 tablet, Refills: 0    !! HYDROcodone-acetaminophen (NORCO/VICODIN) 5-325 MG tablet Take 1-2 tablets by mouth every 4 (four) hours as needed for moderate pain. Qty: 30 tablet, Refills: 0    isosorbide dinitrate (ISORDIL) 30 MG tablet Take 30 mg by mouth 2 (two) times daily.    nitroGLYCERIN (NITROSTAT) 0.4 MG SL tablet Place 0.4 mg under the tongue every 5 (five) minutes as needed for chest pain.    omeprazole (PRILOSEC) 20 MG capsule Take 20 mg by mouth daily.    potassium chloride SA (K-DUR,KLOR-CON) 20 MEQ tablet Take 1 tablet (20 mEq total) by mouth daily. Qty: 30 tablet, Refills: 5    propranolol (INDERAL) 20 MG tablet Take 40 mg by mouth 3 (three) times daily.    ramipril (ALTACE) 5 MG capsule Take 5 mg by mouth daily.    simvastatin (ZOCOR) 20 MG tablet Take 20 mg by mouth at bedtime.    traMADol (ULTRAM) 50 MG tablet Take 0.5-1 tablets (25-50 mg total) by mouth 3 (three) times daily as needed for moderate pain. Qty: 10 tablet, Refills: 0     !! - Potential duplicate medications found. Please discuss with provider.    STOP taking these medications     amoxicillin-clavulanate (AUGMENTIN) 875-125 MG tablet      labetalol (NORMODYNE) 100 MG tablet          DISCHARGE INSTRUCTIONS:    If you experience worsening of your admission symptoms, develop shortness of breath, life threatening emergency, suicidal or homicidal thoughts you must seek medical attention immediately by calling 911 or calling your MD immediately  if symptoms less severe.  You Must read complete instructions/literature along with all the possible adverse reactions/side effects for all the Medicines you take and that have been prescribed to you. Take any new Medicines after you have completely understood and accept all the possible adverse reactions/side effects.   Please note  You were cared for by a hospitalist during your hospital stay. If you have any questions  about your discharge medications or the care you received while you were in the hospital after you are discharged, you can call the unit and asked to speak with the hospitalist on call if the hospitalist that took care of you is not available. Once you are discharged, your primary care physician will handle any further medical issues. Please note that NO REFILLS for any discharge medications will be authorized once you are discharged, as it is imperative that you return to your primary care physician (or establish a relationship with a primary care physician if you do not have one) for your aftercare needs so that they can reassess your need for medications and monitor your lab values.    Today   SUBJECTIVE    No complaint. Better  oral intake.  VITAL SIGNS:  Blood pressure 163/61, pulse 65, temperature 98.3 F (36.8 C), temperature source Oral, resp. rate 18, height 5\' 7"  (1.702 m), weight 76.749 kg (169 lb 3.2 oz), SpO2 97 %.  I/O:   Intake/Output Summary (Last 24 hours) at 04/08/15 1706 Last data filed at 04/07/15 2211  Gross per 24 hour  Intake 1131.25 ml  Output      0 ml  Net 1131.25 ml    PHYSICAL EXAMINATION:  GENERAL:  69 y.o.-year-old patient lying in the bed with no acute distress.  EYES: Pupils equal, round, reactive to light and accommodation. No scleral icterus. Extraocular muscles intact.  HEENT: Head atraumatic, normocephalic. Oropharynx and nasopharynx clear.  NECK:  Supple, no jugular venous distention. No thyroid enlargement, no tenderness.  LUNGS: Normal breath sounds bilaterally, no wheezing, rales,rhonchi or crepitation. No use of accessory muscles of respiration.  CARDIOVASCULAR: S1, S2 normal. No murmurs, rubs, or gallops.  ABDOMEN: Soft, non-tender, non-distended. Bowel sounds present. No organomegaly or mass.  EXTREMITIES: No pedal edema, cyanosis, or clubbing.  dressing present on right foot. NEUROLOGIC: Cranial nerves II through XII are intact. Muscle  strength 4/5 in all extremities. Sensation intact. Gait not checked.  PSYCHIATRIC: The patient is alert and oriented x 3.  SKIN: No obvious rash, lesion, or ulcer.   DATA REVIEW:   CBC  Recent Labs Lab 04/08/15 0500  WBC 4.3  HGB 8.1*  HCT 24.6*  PLT 236    Chemistries   Recent Labs Lab 04/08/15 0500  NA 139  K 3.9  CL 116*  CO2 20*  GLUCOSE 112*  BUN 39*  CREATININE 1.45*  CALCIUM 7.3*  AST 15  ALT 6*  ALKPHOS 52  BILITOT 0.6    Cardiac Enzymes No results for input(s): TROPONINI in the last 168 hours.  Microbiology Results  Results for orders placed or performed during the hospital encounter of 04/02/15  Wound culture     Status: None (Preliminary result)   Collection Time: 04/02/15  1:50 AM  Result Value Ref Range Status   Specimen Description ULCER  Final   Special Requests Normal  Final   Gram Stain   Final    MODERATE WBC SEEN RARE GRAM POSITIVE COCCI IN PAIRS RARE GRAM VARIABLE ROD    Culture   Final    MODERATE GROWTH STENOTROPHOMONAS MALTOPHILIA SENDING TO [facility] FOR FURTHER SUSCEPTIBILITY TESTING PER DR. FITZGERALD    Report Status PENDING  Incomplete   Organism ID, Bacteria STENOTROPHOMONAS MALTOPHILIA  Final      Susceptibility   Stenotrophomonas maltophilia - MIC*    LEVOFLOXACIN >=8 RESISTANT Resistant     TRIMETH/SULFA 80 RESISTANT Resistant     * MODERATE GROWTH STENOTROPHOMONAS MALTOPHILIA  Surgical pcr screen     Status: None   Collection Time: 04/02/15 12:34 PM  Result Value Ref Range Status   MRSA, PCR NEGATIVE NEGATIVE Final   Staphylococcus aureus NEGATIVE NEGATIVE Final    Comment:        The Xpert SA Assay (FDA approved for NASAL specimens in patients over 60 years of age), is one component of a comprehensive surveillance program.  Test performance has been validated by [facility] for patients greater than or equal to 8 year old. It is not intended to diagnose infection nor to guide or monitor treatment.    Culture, blood (routine x 2)     Status: None   Collection Time: 04/02/15  1:50 PM  Result Value Ref Range Status  Specimen Description BLOOD RIGHT HAND  Final   Special Requests BOTTLES DRAWN AEROBIC AND ANAEROBIC 4CC  Final   Culture NO GROWTH 5 DAYS  Final   Report Status 04/07/2015 FINAL  Final  Culture, blood (routine x 2)     Status: None   Collection Time: 04/02/15  2:08 PM  Result Value Ref Range Status   Specimen Description BLOOD LEFT ASSIST CONTROL  Final   Special Requests BOTTLES DRAWN AEROBIC AND ANAEROBIC 4CC  Final   Culture NO GROWTH 5 DAYS  Final   Report Status 04/07/2015 FINAL  Final    RADIOLOGY:  No results found.      Management plans discussed with the patient, family and they are in agreement.  CODE STATUS:     Code Status Orders        Start     Ordered   04/03/15 1923  Full code   Continuous     04/03/15 1922      TOTAL TIME TAKING CARE OF THIS PATIENT: 37 minutes.    Demetrios Loll M.D on 04/08/2015 at 5:06 PM  Between 7am to 6pm - Pager - [phone]  After 6pm go to [email or web address] - password [email or web address] [facility]  Office  [phone]  CC: Primary care physician; Morton Peters, MD

## 2015-04-08 NOTE — Discharge Instructions (Signed)
Heart healthy and ADA diet. Activity as tolerated. HHPT (RN, nurse aid) Antibiotics for 4 weeks,  f/u Dr. Ola Spurr.

## 2015-04-12 LAB — TISSUE CULTURE

## 2015-04-14 LAB — MISC LABCORP TEST (SEND OUT): LABCORP TEST CODE: 88013

## 2015-04-19 ENCOUNTER — Inpatient Hospital Stay: Payer: Medicare HMO | Admitting: Hematology and Oncology

## 2015-05-02 ENCOUNTER — Encounter: Payer: Self-pay | Admitting: Emergency Medicine

## 2015-05-02 ENCOUNTER — Emergency Department
Admission: EM | Admit: 2015-05-02 | Discharge: 2015-05-02 | Disposition: A | Discharge status: Home / Self Care | Payer: Medicare HMO | Attending: Emergency Medicine | Admitting: Emergency Medicine

## 2015-05-02 DIAGNOSIS — Z7984 Long term (current) use of oral hypoglycemic drugs: Secondary | ICD-10-CM | POA: Insufficient documentation

## 2015-05-02 DIAGNOSIS — N183 Chronic kidney disease, stage 3 (moderate): Secondary | ICD-10-CM | POA: Diagnosis not present

## 2015-05-02 DIAGNOSIS — I129 Hypertensive chronic kidney disease with stage 1 through stage 4 chronic kidney disease, or unspecified chronic kidney disease: Secondary | ICD-10-CM | POA: Insufficient documentation

## 2015-05-02 DIAGNOSIS — J029 Acute pharyngitis, unspecified: Secondary | ICD-10-CM | POA: Insufficient documentation

## 2015-05-02 DIAGNOSIS — E119 Type 2 diabetes mellitus without complications: Secondary | ICD-10-CM | POA: Insufficient documentation

## 2015-05-02 DIAGNOSIS — Z79899 Other long term (current) drug therapy: Secondary | ICD-10-CM | POA: Diagnosis not present

## 2015-05-02 MED ORDER — GI COCKTAIL ~~LOC~~
ORAL | Status: AC
  Filled 2015-05-02: qty 30

## 2015-05-02 NOTE — ED Notes (Signed)
AAOx3.  Skin warm and dry. No SOB/ DOE.  D/C home. 

## 2015-05-02 NOTE — ED Provider Notes (Signed)
[facility] Emergency Department Provider Note  Time seen: 4:01 PM  I have reviewed the triage vital signs and the nursing notes.   HISTORY  Chief Complaint Sore Throat    HPI [name] is a 69 y.o. female with a past medical history of hypertension, diabetes, anemia, CHF, hyperlipidemia, CAD, CVA, angina, gastric reflux, presents the emergency department the sore throat. According to the patient for the past 2 days she has had a sore throat. Somewhat worse today. Patient describes some pain when swallowing, she has been using Chloraseptic Spray with some relief. Denies fever, nausea, vomiting, chest pain, abdominal pain. Describes her sore throat is moderate.Burning/scratching sensation in the back of her throat.     Past Medical History  Diagnosis Date  . Hypertension   . Diabetes mellitus without complication ([place])   . Anemia   . Coronary artery disease   . CHF (congestive heart failure) ([place])   . Glaucoma   . Hyperlipidemia   . Chronic kidney disease   . Neuropathy ([place])   . Chronic anemia   . Stroke ([place])   . Anginal pain ([place])   . Arthritis   . Retinopathy   . Cardiomyopathy ([place])   . Carotid artery stenosis   . Hoarseness, chronic   . GERD (gastroesophageal reflux disease)   . Hemangioma of liver   . Weight loss, unintentional     Patient Active Problem List   Diagnosis Date Noted  . 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 Osteomyelitis of ankle or foot ([place]) 04/02/2015  . Weight loss 02/21/2015  . Cellulitis of great toe of right foot 02/02/2015  . Diabetes mellitus ([place]) 02/02/2015  . CKD (chronic kidney disease), stage III 02/02/2015  . Anemia of chronic disease 02/02/2015  . Anemia 01/30/2015  . Infected wound ([place]) 01/29/2015  . Subdural hematoma ([place]) 12/06/2014  . Pressure ulcer 12/05/2014  . SDH (subdural hematoma) ([place]) 12/03/2014  . Chronic diastolic heart failure ([place]) 11/02/2014  . Melena 10/02/2014  . DM  (diabetes mellitus) ([place]) 02/04/2010  . Deficiency anemia 02/04/2010  . Essential hypertension 02/04/2010  . Coronary atherosclerosis 02/04/2010    Past Surgical History  Procedure Laterality Date  . Cardiac catheterization    . Esophagogastroduodenoscopy (egd) with propofol N/A 09/24/2014    Elliott-gastritis & normal Billroth II changes   . Colonoscopy with propofol N/A 09/24/2014    Elliott-Incomplete secondary to prep  . Coronary angioplasty  2016    stent placed  . Bilroth ii procedure    . Colonoscopy with propofol N/A 11/12/2014    Procedure: COLONOSCOPY WITH PROPOFOL;  Surgeon: Manya Silvas, MD;  Location: [facility] ENDOSCOPY;  Service: Endoscopy;  Laterality: N/A;  Trudee Kuster hole Right 12/06/2014    Procedure: Right burr hole  for subdural hematoma;  Surgeon: Newman Pies, MD;  Location: [facility] NEURO ORS;  Service: Neurosurgery;  Laterality: Right;  . Irrigation and debridement foot Right 02/01/2015    Procedure: IRRIGATION AND DEBRIDEMENT FOOT/RIGHT GREAT TOE;  Surgeon: [name], DPM;  Location: [facility] ORS;  Service: Podiatry;  Laterality: Right;  . Eye surgery Right     cataract removed  . Amputation toe Right 03/01/2015    Procedure: AMPUTATION  RIGHT GREAT TOE;  Surgeon: [name], DPM;  Location: [facility] ORS;  Service: Podiatry;  Laterality: Right;  . Irrigation and debridement foot Right 04/03/2015    Procedure: IRRIGATION AND DEBRIDEMENT FOOT;  Surgeon: Sharlotte Alamo, MD;  Location: [facility] ORS;  Service: Podiatry;  Laterality:  Right;  . Peripheral vascular catheterization N/A 04/04/2015    Procedure: Abdominal Aortogram w/Lower Extremity;  Surgeon: Algernon Huxley, MD;  Location: [facility] CV LAB;  Service: Cardiovascular;  Laterality: N/A;  . Peripheral vascular catheterization  04/04/2015    Procedure: Lower Extremity Intervention;  Surgeon: Algernon Huxley, MD;  Location: [facility] CV LAB;  Service: Cardiovascular;;    Current Outpatient Rx  Name  Route  Sig  Dispense  Refill   . acetaminophen (TYLENOL) 325 MG tablet   Oral   Take 2 tablets (650 mg total) by mouth every 4 (four) hours as needed for mild pain (or temp > 99 F). Patient not taking: Reported on 03/04/2015         . cefTAZidime 1 g in dextrose 5 % 50 mL   Intravenous   Inject 1 g into the vein every 12 (twelve) hours.   46 g   0   . cloNIDine (CATAPRES) 0.2 MG tablet   Oral   Take 1 tablet (0.2 mg total) by mouth 3 (three) times daily.   90 tablet   0   . docusate sodium (COLACE) 100 MG capsule   Oral   Take 1 capsule (100 mg total) by mouth 2 (two) times daily.   60 capsule   11   . feeding supplement, GLUCERNA SHAKE, (GLUCERNA SHAKE) LIQD   Oral   Take 237 mLs by mouth 2 (two) times daily between meals.   60 Can   0   . ferrous sulfate 325 (65 FE) MG tablet   Oral   Take 325 mg by mouth daily.         . folic acid (FOLVITE) 1 MG tablet   Oral   Take 1 tablet (1 mg total) by mouth daily.   60 tablet   1   . furosemide (LASIX) 40 MG tablet   Oral   Take 1 tablet (40 mg total) by mouth 2 (two) times daily.   60 tablet   5   . glimepiride (AMARYL) 2 MG tablet   Oral   Take 1 mg by mouth daily.         . hydrALAZINE (APRESOLINE) 100 MG tablet   Oral   Take 100 mg by mouth 3 (three) times daily.          Marland Kitchen HYDROcodone-acetaminophen (NORCO) 5-325 MG tablet   Oral   Take 1 tablet by mouth every 6 (six) hours as needed for moderate pain.   30 tablet   0   . HYDROcodone-acetaminophen (NORCO/VICODIN) 5-325 MG tablet   Oral   Take 1-2 tablets by mouth every 4 (four) hours as needed for moderate pain.   30 tablet   0   . isosorbide dinitrate (ISORDIL) 30 MG tablet   Oral   Take 30 mg by mouth 2 (two) times daily.         . minocycline (MINOCIN,DYNACIN) 100 MG capsule   Oral   Take 1 capsule (100 mg total) by mouth 2 (two) times daily.   46 capsule   0   . nitroGLYCERIN (NITROSTAT) 0.4 MG SL tablet   Sublingual   Place 0.4 mg under the tongue every 5  (five) minutes as needed for chest pain.         Marland Kitchen omeprazole (PRILOSEC) 20 MG capsule   Oral   Take 20 mg by mouth daily.         . potassium chloride SA (K-DUR,KLOR-CON) 20 MEQ  tablet   Oral   Take 1 tablet (20 mEq total) by mouth daily.   30 tablet   5   . propranolol (INDERAL) 20 MG tablet   Oral   Take 40 mg by mouth 3 (three) times daily.         . ramipril (ALTACE) 5 MG capsule   Oral   Take 5 mg by mouth daily.         . simvastatin (ZOCOR) 20 MG tablet   Oral   Take 20 mg by mouth at bedtime.         . traMADol (ULTRAM) 50 MG tablet   Oral   Take 0.5-1 tablets (25-50 mg total) by mouth 3 (three) times daily as needed for moderate pain.   10 tablet   0     Allergies Codeine  Family History  Problem Relation Age of Onset  . Diabetes Mother   . Heart disease Father   . Diabetes Sister   . Lung cancer Brother   . Cancer Brother   . Diabetes Brother   . Diabetes Sister     Social History Social History  Substance Use Topics  . Smoking status: Never Smoker   . Smokeless tobacco: Never Used     Comment: quit many years ago  . Alcohol Use: No    Review of Systems Constitutional: Negative for fever. ENT: Positive for sore throat Cardiovascular: Negative for chest pain. Respiratory: Negative for shortness of breath. Gastrointestinal: Negative for abdominal pain Neurological: Negative for headache 10-point ROS otherwise negative.  ____________________________________________   PHYSICAL EXAM:  VITAL SIGNS: ED Triage Vitals  Enc Vitals Group     BP 05/02/15 1540 175/66 mmHg     Pulse Rate 05/02/15 1540 67     Resp 05/02/15 1540 16     Temp 05/02/15 1540 98.6 F (37 C)     Temp Source 05/02/15 1540 Oral     SpO2 05/02/15 1540 100 %     Weight 05/02/15 1540 150 lb (68.04 kg)     Height 05/02/15 1540 5\' 7"  (1.702 m)     Head Cir --      Peak Flow --      Pain Score 05/02/15 1541 6     Pain Loc --      Pain Edu? --      Excl. in  [place]? --     Constitutional: Alert and oriented. Well appearing and in no distress. Eyes: Normal exam ENT   Head: Normocephalic and atraumatic   Mouth/Throat: Mucous membranes are moist. Mild pharyngeal erythema without tonsillar hypertrophy or exudate. Cardiovascular: Normal rate, regular rhythm. No murmur Respiratory: Normal respiratory effort without tachypnea nor retractions. Breath sounds are clear and equal bilaterally. No wheezes/rales/rhonchi. Gastrointestinal: Soft and nontender. No distention. Musculoskeletal: Nontender with normal range of motion in all extremities Neurologic:  Normal speech and language. No gross focal neurologic deficits  Skin:  Skin is warm, dry and intact.  Psychiatric: Mood and affect are normal. Speech and behavior are normal.  ____________________________________________    INITIAL IMPRESSION / ASSESSMENT AND PLAN / ED COURSE  Pertinent labs & imaging results that were available during my care of the patient were reviewed by me and considered in my medical decision making (see chart for details).  Patient presents for sore throat 2 days. Mild pharyngeal erythema on exam, no obvious tonsillar exudate, no tonsillar hypertrophy. Midline uvula. We will perform a rapid strep test, if negative we will send a  culture.  Rapid strep is negative. We will discharge the patient home, with supportive care. Patient spoke with her primary care physician in 2-3 days for recheck if not improved.  ____________________________________________   FINAL CLINICAL IMPRESSION(S) / ED DIAGNOSES  Pharyngitis   Harvest Dark, MD 05/02/15 [phone]

## 2015-05-02 NOTE — Discharge Instructions (Signed)

## 2015-05-02 NOTE — ED Notes (Signed)
Patient presents to the ED with sore throat since Tuesday.  Patient states throat seems to hurt more at night but it has hurt all day today.  Patient denies fever.  Patient denies shortness of breath.  Patient's voice is hoarse but patient states she has been hoarse for a long time.  Patient is alert and oriented x 4, no obvious distress.

## 2015-05-05 LAB — CULTURE, GROUP A STREP (THRC)

## 2015-05-06 LAB — POCT RAPID STREP A: Streptococcus, Group A Screen (Direct): NEGATIVE

## 2015-05-20 ENCOUNTER — Other Ambulatory Visit
Admission: RE | Admit: 2015-05-20 | Discharge: 2015-05-20 | Disposition: A | Discharge status: Home / Self Care | Payer: Commercial Managed Care - HMO | Source: Ambulatory Visit | Attending: Podiatry | Admitting: Podiatry

## 2015-05-20 ENCOUNTER — Inpatient Hospital Stay: Payer: Commercial Managed Care - HMO | Admitting: Hematology and Oncology

## 2015-05-20 DIAGNOSIS — M868X7 Other osteomyelitis, ankle and foot: Secondary | ICD-10-CM | POA: Diagnosis present

## 2015-05-23 ENCOUNTER — Encounter
Admission: RE | Admit: 2015-05-23 | Discharge: 2015-05-23 | Disposition: A | Discharge status: Home / Self Care | Payer: Commercial Managed Care - HMO | Source: Ambulatory Visit | Attending: Podiatry | Admitting: Podiatry

## 2015-05-23 DIAGNOSIS — Z01810 Encounter for preprocedural cardiovascular examination: Secondary | ICD-10-CM | POA: Insufficient documentation

## 2015-05-23 DIAGNOSIS — Z01812 Encounter for preprocedural laboratory examination: Secondary | ICD-10-CM | POA: Diagnosis present

## 2015-05-23 HISTORY — DX: Other acute osteomyelitis, right ankle and foot: M86.171

## 2015-05-23 HISTORY — DX: Other intervertebral disc degeneration, lumbar region without mention of lumbar back pain or lower extremity pain: M51.369

## 2015-05-23 HISTORY — DX: Other intervertebral disc degeneration, lumbar region: M51.36

## 2015-05-23 LAB — CBC
HCT: 29.6 % — ABNORMAL LOW (ref 35.0–47.0)
HEMOGLOBIN: 9.7 g/dL — AB (ref 12.0–16.0)
MCH: 29.3 pg (ref 26.0–34.0)
MCHC: 32.6 g/dL (ref 32.0–36.0)
MCV: 89.7 fL (ref 80.0–100.0)
PLATELETS: 160 10*3/uL (ref 150–440)
RBC: 3.3 MIL/uL — AB (ref 3.80–5.20)
RDW: 21.3 % — ABNORMAL HIGH (ref 11.5–14.5)
WBC: 3 10*3/uL — ABNORMAL LOW (ref 3.6–11.0)

## 2015-05-23 LAB — WOUND CULTURE

## 2015-05-23 LAB — BASIC METABOLIC PANEL
Anion gap: 6 (ref 5–15)
BUN: 29 mg/dL — ABNORMAL HIGH (ref 6–20)
CALCIUM: 7.9 mg/dL — AB (ref 8.9–10.3)
CO2: 22 mmol/L (ref 22–32)
CREATININE: 1.35 mg/dL — AB (ref 0.44–1.00)
Chloride: 111 mmol/L (ref 101–111)
GFR calc non Af Amer: 39 mL/min — ABNORMAL LOW (ref >60)
GFR, EST AFRICAN AMERICAN: 45 mL/min — AB (ref >60)
GLUCOSE: 139 mg/dL — AB (ref 65–99)
Potassium: 3.9 mmol/L (ref 3.5–5.1)
Sodium: 139 mmol/L (ref 135–145)

## 2015-05-23 LAB — DIFFERENTIAL
Basophils Absolute: 0 10*3/uL (ref 0–0.1)
Basophils Relative: 1 %
EOS ABS: 0 10*3/uL (ref 0–0.7)
EOS PCT: 1 %
LYMPHS ABS: 0.7 10*3/uL — AB (ref 1.0–3.6)
LYMPHS PCT: 24 %
MONO ABS: 0.2 10*3/uL (ref 0.2–0.9)
Monocytes Relative: 7 %
Neutro Abs: 2 10*3/uL (ref 1.4–6.5)
Neutrophils Relative %: 67 %

## 2015-05-23 LAB — SURGICAL PCR SCREEN
MRSA, PCR: NEGATIVE
Staphylococcus aureus: NEGATIVE

## 2015-05-23 NOTE — Pre-Procedure Instructions (Signed)
Dr. Kayleen Memos reviewed EKG and noted medical history . Requested cardiac and medical clearance.

## 2015-05-23 NOTE — Pre-Procedure Instructions (Signed)
Cardiac and medical clearance called and faxed to Dr. Cleda Mccreedy office.

## 2015-05-23 NOTE — Patient Instructions (Signed)
  Your procedure is scheduled [id number] 24, 2017 (Tuesday) Report to Day Surgery.[facility]) Second Floor To find out your arrival time please call [phone] between 1PM - 3PM on May 27, 2015 (Monday).  Remember: Instructions that are not followed completely may result in serious medical risk, up to and including death, or upon the discretion of your surgeon and anesthesiologist your surgery may need to be rescheduled.    __x__ 1. Do not eat food or drink liquids after midnight. No gum chewing or hard candies.     ____ 2. No Alcohol for 24 hours before or after surgery.   ____ 3. Bring all medications with you on the day of surgery if instructed.    __x__ 4. Notify your doctor if there is any change in your medical condition     (cold, fever, infections).     Do not wear jewelry, make-up, hairpins, clips or nail polish.  Do not wear lotions, powders, or perfumes. You may wear deodorant.  Do not shave 48 hours prior to surgery. Men may shave face and neck.  Do not bring valuables to the hospital.    [facility] is not responsible for any belongings or valuables.               Contacts, dentures or bridgework may not be worn into surgery.  Leave your suitcase in the car. After surgery it may be brought to your room.  For patients admitted to the hospital, discharge time is determined by your                treatment team.   Patients discharged the day of surgery will not be allowed to drive home.   Please read over the following fact sheets that you were given:   Surgical Site Infection Prevention   __x__ Take these medicines the morning of surgery with A SIP OF WATER:    1.Clonidine   2HydrAlazine 3.Isosorbide 4Omeprazole (Omeprazole at bedtime on January 23, Monday night also) 5Propranolol 6. Ramipril 7. Labetalol  ____ Fleet Enema (as directed)   __x__ Use CHG Soap as directed (SAGE WIPES)  ____ Use inhalers on the day of surgery  ____ Stop metformin 2 days  prior to surgery    ____ Take 1/2 of usual insulin dose the night before surgery and none on the morning of surgery.   ____ Stop Coumadin/Plavix/aspirin on   __x__ Stop Anti-inflammatories on (STOP MELOXICAM (MOBIC) NOW)  _x_ Stop supplements until after surgery. (STOP OMEGA--3, FISH OIL NOW)   ____ Bring C-Pap to the hospital.

## 2015-05-24 LAB — ANAEROBIC CULTURE

## 2015-05-27 ENCOUNTER — Encounter: Payer: Self-pay | Admitting: *Deleted

## 2015-05-27 ENCOUNTER — Inpatient Hospital Stay
Admission: RE | Admit: 2015-05-27 | Discharge: 2015-05-30 | DRG: 240 | Disposition: A | Discharge status: Skilled Nursing Facility | Payer: Commercial Managed Care - HMO | Source: Ambulatory Visit | Attending: Internal Medicine | Admitting: Internal Medicine

## 2015-05-27 DIAGNOSIS — E11319 Type 2 diabetes mellitus with unspecified diabetic retinopathy without macular edema: Secondary | ICD-10-CM | POA: Diagnosis present

## 2015-05-27 DIAGNOSIS — M5136 Other intervertebral disc degeneration, lumbar region: Secondary | ICD-10-CM | POA: Diagnosis present

## 2015-05-27 DIAGNOSIS — Z89411 Acquired absence of right great toe: Secondary | ICD-10-CM

## 2015-05-27 DIAGNOSIS — M869 Osteomyelitis, unspecified: Secondary | ICD-10-CM | POA: Diagnosis present

## 2015-05-27 DIAGNOSIS — E1122 Type 2 diabetes mellitus with diabetic chronic kidney disease: Secondary | ICD-10-CM | POA: Diagnosis present

## 2015-05-27 DIAGNOSIS — D638 Anemia in other chronic diseases classified elsewhere: Secondary | ICD-10-CM | POA: Diagnosis present

## 2015-05-27 DIAGNOSIS — D62 Acute posthemorrhagic anemia: Secondary | ICD-10-CM | POA: Diagnosis not present

## 2015-05-27 DIAGNOSIS — E1152 Type 2 diabetes mellitus with diabetic peripheral angiopathy with gangrene: Principal | ICD-10-CM | POA: Diagnosis present

## 2015-05-27 DIAGNOSIS — T8130XA Disruption of wound, unspecified, initial encounter: Secondary | ICD-10-CM | POA: Diagnosis present

## 2015-05-27 DIAGNOSIS — Z886 Allergy status to analgesic agent status: Secondary | ICD-10-CM

## 2015-05-27 DIAGNOSIS — L97509 Non-pressure chronic ulcer of other part of unspecified foot with unspecified severity: Secondary | ICD-10-CM | POA: Diagnosis present

## 2015-05-27 DIAGNOSIS — Z79899 Other long term (current) drug therapy: Secondary | ICD-10-CM | POA: Diagnosis not present

## 2015-05-27 DIAGNOSIS — Z833 Family history of diabetes mellitus: Secondary | ICD-10-CM

## 2015-05-27 DIAGNOSIS — M1990 Unspecified osteoarthritis, unspecified site: Secondary | ICD-10-CM | POA: Diagnosis present

## 2015-05-27 DIAGNOSIS — I130 Hypertensive heart and chronic kidney disease with heart failure and stage 1 through stage 4 chronic kidney disease, or unspecified chronic kidney disease: Secondary | ICD-10-CM | POA: Diagnosis present

## 2015-05-27 DIAGNOSIS — E785 Hyperlipidemia, unspecified: Secondary | ICD-10-CM | POA: Diagnosis present

## 2015-05-27 DIAGNOSIS — L97519 Non-pressure chronic ulcer of other part of right foot with unspecified severity: Secondary | ICD-10-CM | POA: Diagnosis present

## 2015-05-27 DIAGNOSIS — H409 Unspecified glaucoma: Secondary | ICD-10-CM | POA: Diagnosis present

## 2015-05-27 DIAGNOSIS — Z95828 Presence of other vascular implants and grafts: Secondary | ICD-10-CM

## 2015-05-27 DIAGNOSIS — Z01810 Encounter for preprocedural cardiovascular examination: Secondary | ICD-10-CM | POA: Diagnosis not present

## 2015-05-27 DIAGNOSIS — Z801 Family history of malignant neoplasm of trachea, bronchus and lung: Secondary | ICD-10-CM | POA: Diagnosis not present

## 2015-05-27 DIAGNOSIS — Z8249 Family history of ischemic heart disease and other diseases of the circulatory system: Secondary | ICD-10-CM | POA: Diagnosis not present

## 2015-05-27 DIAGNOSIS — I2510 Atherosclerotic heart disease of native coronary artery without angina pectoris: Secondary | ICD-10-CM | POA: Diagnosis present

## 2015-05-27 DIAGNOSIS — E11621 Type 2 diabetes mellitus with foot ulcer: Secondary | ICD-10-CM | POA: Diagnosis present

## 2015-05-27 DIAGNOSIS — E11649 Type 2 diabetes mellitus with hypoglycemia without coma: Secondary | ICD-10-CM | POA: Diagnosis present

## 2015-05-27 DIAGNOSIS — N183 Chronic kidney disease, stage 3 (moderate): Secondary | ICD-10-CM | POA: Diagnosis present

## 2015-05-27 DIAGNOSIS — Z89421 Acquired absence of other right toe(s): Secondary | ICD-10-CM | POA: Diagnosis not present

## 2015-05-27 DIAGNOSIS — E1169 Type 2 diabetes mellitus with other specified complication: Secondary | ICD-10-CM | POA: Diagnosis present

## 2015-05-27 DIAGNOSIS — M86171 Other acute osteomyelitis, right ankle and foot: Secondary | ICD-10-CM

## 2015-05-27 DIAGNOSIS — I739 Peripheral vascular disease, unspecified: Secondary | ICD-10-CM

## 2015-05-27 DIAGNOSIS — Z8673 Personal history of transient ischemic attack (TIA), and cerebral infarction without residual deficits: Secondary | ICD-10-CM

## 2015-05-27 DIAGNOSIS — K219 Gastro-esophageal reflux disease without esophagitis: Secondary | ICD-10-CM | POA: Diagnosis present

## 2015-05-27 DIAGNOSIS — E1140 Type 2 diabetes mellitus with diabetic neuropathy, unspecified: Secondary | ICD-10-CM | POA: Diagnosis present

## 2015-05-27 DIAGNOSIS — Z9841 Cataract extraction status, right eye: Secondary | ICD-10-CM | POA: Diagnosis not present

## 2015-05-27 DIAGNOSIS — Z794 Long term (current) use of insulin: Secondary | ICD-10-CM

## 2015-05-27 DIAGNOSIS — I509 Heart failure, unspecified: Secondary | ICD-10-CM | POA: Diagnosis present

## 2015-05-27 DIAGNOSIS — E1142 Type 2 diabetes mellitus with diabetic polyneuropathy: Secondary | ICD-10-CM

## 2015-05-27 LAB — CBC
HEMATOCRIT: 26.1 % — AB (ref 35.0–47.0)
Hemoglobin: 8.4 g/dL — ABNORMAL LOW (ref 12.0–16.0)
MCH: 28.7 pg (ref 26.0–34.0)
MCHC: 32.1 g/dL (ref 32.0–36.0)
MCV: 89.4 fL (ref 80.0–100.0)
Platelets: 160 10*3/uL (ref 150–440)
RBC: 2.92 MIL/uL — AB (ref 3.80–5.20)
RDW: 20.9 % — ABNORMAL HIGH (ref 11.5–14.5)
WBC: 4.1 10*3/uL (ref 3.6–11.0)

## 2015-05-27 LAB — SURGICAL PCR SCREEN
MRSA, PCR: NEGATIVE
STAPHYLOCOCCUS AUREUS: NEGATIVE

## 2015-05-27 LAB — PROTIME-INR
INR: 1.18
PROTHROMBIN TIME: 15.2 s — AB (ref 11.4–15.0)

## 2015-05-27 LAB — BASIC METABOLIC PANEL
Anion gap: 8 (ref 5–15)
BUN: 39 mg/dL — AB (ref 6–20)
CALCIUM: 7.6 mg/dL — AB (ref 8.9–10.3)
CHLORIDE: 109 mmol/L (ref 101–111)
CO2: 18 mmol/L — AB (ref 22–32)
CREATININE: 1.71 mg/dL — AB (ref 0.44–1.00)
GFR calc non Af Amer: 29 mL/min — ABNORMAL LOW (ref >60)
GFR, EST AFRICAN AMERICAN: 34 mL/min — AB (ref >60)
Glucose, Bld: 199 mg/dL — ABNORMAL HIGH (ref 65–99)
Potassium: 3.8 mmol/L (ref 3.5–5.1)
SODIUM: 135 mmol/L (ref 135–145)

## 2015-05-27 LAB — GLUCOSE, CAPILLARY
GLUCOSE-CAPILLARY: 195 mg/dL — AB (ref 65–99)
Glucose-Capillary: 174 mg/dL — ABNORMAL HIGH (ref 65–99)
Glucose-Capillary: 241 mg/dL — ABNORMAL HIGH (ref 65–99)

## 2015-05-27 MED ORDER — NITROGLYCERIN 0.4 MG SL SUBL: 0.4000 mg | SUBLINGUAL_TABLET | SUBLINGUAL | Status: DC | PRN

## 2015-05-27 MED ORDER — PROPRANOLOL HCL 20 MG PO TABS
40.0000 mg | ORAL_TABLET | Freq: Three times a day (TID) | ORAL | Status: DC
  Administered 2015-05-27 – 2015-05-30 (×7): 40 mg via ORAL
  Filled 2015-05-27 (×7): qty 2

## 2015-05-27 MED ORDER — PIPERACILLIN-TAZOBACTAM 3.375 G IVPB
3.3750 g | Freq: Three times a day (TID) | INTRAVENOUS | Status: DC
  Administered 2015-05-27 – 2015-05-30 (×8): 3.375 g via INTRAVENOUS
  Filled 2015-05-27 (×12): qty 50

## 2015-05-27 MED ORDER — INSULIN ASPART 100 UNIT/ML ~~LOC~~ SOLN
0.0000 [IU] | Freq: Three times a day (TID) | SUBCUTANEOUS | Status: DC
  Administered 2015-05-27 (×2): 2 [IU] via SUBCUTANEOUS
  Administered 2015-05-29 (×2): 1 [IU] via SUBCUTANEOUS
  Filled 2015-05-27 (×2): qty 1
  Filled 2015-05-27 (×2): qty 2

## 2015-05-27 MED ORDER — ONDANSETRON HCL 4 MG PO TABS: 4.0000 mg | ORAL_TABLET | Freq: Four times a day (QID) | ORAL | Status: DC | PRN

## 2015-05-27 MED ORDER — SENNA 8.6 MG PO TABS
1.0000 | ORAL_TABLET | Freq: Two times a day (BID) | ORAL | Status: DC
  Administered 2015-05-27 – 2015-05-30 (×4): 8.6 mg via ORAL
  Filled 2015-05-27 (×6): qty 1

## 2015-05-27 MED ORDER — ISOSORBIDE DINITRATE 30 MG PO TABS
30.0000 mg | ORAL_TABLET | Freq: Two times a day (BID) | ORAL | Status: DC
  Administered 2015-05-28 – 2015-05-30 (×3): 30 mg via ORAL
  Filled 2015-05-27 (×7): qty 1

## 2015-05-27 MED ORDER — CLONIDINE HCL 0.1 MG PO TABS
0.2000 mg | ORAL_TABLET | Freq: Three times a day (TID) | ORAL | Status: DC
  Administered 2015-05-27 – 2015-05-30 (×6): 0.2 mg via ORAL
  Filled 2015-05-27 (×6): qty 2

## 2015-05-27 MED ORDER — FOLIC ACID 1 MG PO TABS
1.0000 mg | ORAL_TABLET | Freq: Every day | ORAL | Status: DC
  Administered 2015-05-29 – 2015-05-30 (×2): 1 mg via ORAL
  Filled 2015-05-27 (×2): qty 1

## 2015-05-27 MED ORDER — HYDRALAZINE HCL 50 MG PO TABS
100.0000 mg | ORAL_TABLET | Freq: Three times a day (TID) | ORAL | Status: DC
  Administered 2015-05-27 – 2015-05-30 (×6): 100 mg via ORAL
  Filled 2015-05-27 (×6): qty 2

## 2015-05-27 MED ORDER — FERROUS SULFATE 325 (65 FE) MG PO TABS
325.0000 mg | ORAL_TABLET | Freq: Every day | ORAL | Status: DC
  Administered 2015-05-29 – 2015-05-30 (×2): 325 mg via ORAL
  Filled 2015-05-27 (×2): qty 1

## 2015-05-27 MED ORDER — RAMIPRIL 5 MG PO CAPS
5.0000 mg | ORAL_CAPSULE | Freq: Every day | ORAL | Status: DC
  Administered 2015-05-29 – 2015-05-30 (×2): 5 mg via ORAL
  Filled 2015-05-27 (×3): qty 1

## 2015-05-27 MED ORDER — ENOXAPARIN SODIUM 30 MG/0.3ML ~~LOC~~ SOLN
30.0000 mg | SUBCUTANEOUS | Status: DC
  Administered 2015-05-27: 30 mg via SUBCUTANEOUS
  Filled 2015-05-27: qty 0.3

## 2015-05-27 MED ORDER — ENOXAPARIN SODIUM 40 MG/0.4ML ~~LOC~~ SOLN: 40.0000 mg | SUBCUTANEOUS | Status: DC

## 2015-05-27 MED ORDER — POLYETHYLENE GLYCOL 3350 17 G PO PACK: 17.0000 g | PACK | Freq: Every day | ORAL | Status: DC | PRN

## 2015-05-27 MED ORDER — SODIUM CHLORIDE 0.9 % IJ SOLN
INTRAMUSCULAR | Status: AC
  Filled 2015-05-27: qty 3

## 2015-05-27 MED ORDER — DOCUSATE SODIUM 100 MG PO CAPS
100.0000 mg | ORAL_CAPSULE | Freq: Two times a day (BID) | ORAL | Status: DC
  Administered 2015-05-27 – 2015-05-30 (×4): 100 mg via ORAL
  Filled 2015-05-27 (×5): qty 1

## 2015-05-27 MED ORDER — LABETALOL HCL 100 MG PO TABS
100.0000 mg | ORAL_TABLET | Freq: Two times a day (BID) | ORAL | Status: DC
  Administered 2015-05-27 – 2015-05-30 (×5): 100 mg via ORAL
  Filled 2015-05-27 (×5): qty 1

## 2015-05-27 MED ORDER — CLINDAMYCIN PHOSPHATE 600 MG/50ML IV SOLN
600.0000 mg | Freq: Three times a day (TID) | INTRAVENOUS | Status: DC
  Administered 2015-05-27: 600 mg via INTRAVENOUS
  Filled 2015-05-27 (×2): qty 50

## 2015-05-27 MED ORDER — GLUCERNA SHAKE PO LIQD
237.0000 mL | Freq: Two times a day (BID) | ORAL | Status: DC
  Administered 2015-05-29 – 2015-05-30 (×4): 237 mL via ORAL

## 2015-05-27 MED ORDER — FUROSEMIDE 40 MG PO TABS
40.0000 mg | ORAL_TABLET | Freq: Two times a day (BID) | ORAL | Status: DC
  Administered 2015-05-28 – 2015-05-30 (×4): 40 mg via ORAL
  Filled 2015-05-27 (×4): qty 1

## 2015-05-27 MED ORDER — POTASSIUM CHLORIDE CRYS ER 20 MEQ PO TBCR
20.0000 meq | EXTENDED_RELEASE_TABLET | Freq: Every day | ORAL | Status: DC
  Administered 2015-05-29 – 2015-05-30 (×2): 20 meq via ORAL
  Filled 2015-05-27 (×2): qty 1

## 2015-05-27 MED ORDER — PANTOPRAZOLE SODIUM 40 MG PO TBEC
40.0000 mg | DELAYED_RELEASE_TABLET | Freq: Every day | ORAL | Status: DC
  Administered 2015-05-29 – 2015-05-30 (×2): 40 mg via ORAL
  Filled 2015-05-27 (×2): qty 1

## 2015-05-27 MED ORDER — ACETAMINOPHEN 325 MG PO TABS: 650.0000 mg | ORAL_TABLET | ORAL | Status: DC | PRN

## 2015-05-27 MED ORDER — ACETAMINOPHEN 325 MG PO TABS
650.0000 mg | ORAL_TABLET | Freq: Four times a day (QID) | ORAL | Status: DC | PRN
  Administered 2015-05-29: 650 mg via ORAL
  Filled 2015-05-27: qty 2

## 2015-05-27 MED ORDER — SIMVASTATIN 20 MG PO TABS
20.0000 mg | ORAL_TABLET | Freq: Every day | ORAL | Status: DC
  Administered 2015-05-27 – 2015-05-28 (×2): 20 mg via ORAL
  Filled 2015-05-27 (×3): qty 1

## 2015-05-27 MED ORDER — GLIMEPIRIDE 2 MG PO TABS: 1.0000 mg | ORAL_TABLET | Freq: Every day | ORAL | Status: DC

## 2015-05-27 MED ORDER — MIRTAZAPINE 15 MG PO TABS
15.0000 mg | ORAL_TABLET | Freq: Every day | ORAL | Status: DC
  Administered 2015-05-27 – 2015-05-28 (×2): 15 mg via ORAL
  Filled 2015-05-27 (×2): qty 1

## 2015-05-27 MED ORDER — ONDANSETRON HCL 4 MG/2ML IJ SOLN: 4.0000 mg | Freq: Four times a day (QID) | INTRAMUSCULAR | Status: DC | PRN

## 2015-05-27 MED ORDER — SODIUM CHLORIDE 0.9 % IV SOLN
INTRAVENOUS | Status: DC
  Administered 2015-05-27 – 2015-05-28 (×3): via INTRAVENOUS

## 2015-05-27 MED ORDER — ACETAMINOPHEN 650 MG RE SUPP: 650.0000 mg | Freq: Four times a day (QID) | RECTAL | Status: DC | PRN

## 2015-05-27 NOTE — Progress Notes (Signed)
ANTIBIOTIC CONSULT NOTE - INITIAL  Pharmacy Consult for Zosyn Indication: nonhealing diabetic foot ulcer with osteomyelitis chronic  Allergies  Allergen Reactions  . Codeine Other (See Comments)    Reaction:  Hallucinations    Patient Measurements: Height: 5\' 7"  (170.2 cm) Weight: 143 lb (64.864 kg) IBW/kg (Calculated) : 61.6 Adjusted Body Weight:   Vital Signs: Temp: 97.8 F (36.6 C) (01/23 1553) Temp Source: Oral (01/23 1553) BP: 142/48 mmHg (01/23 1553) Pulse Rate: 58 (01/23 1553) Intake/Output from previous day:   Intake/Output from this shift:    Labs:  Recent Labs  05/27/15 1156  WBC 4.1  HGB 8.4*  PLT 160  CREATININE 1.71*   Estimated Creatinine Clearance: 29.8 mL/min (by C-G formula based on Cr of 1.71).   Medical History: Past Medical History  Diagnosis Date  . Hypertension   . Diabetes mellitus without complication ([place])   . Anemia   . Coronary artery disease   . CHF (congestive heart failure) ([place])   . Glaucoma   . Hyperlipidemia   . Neuropathy ([place])   . Chronic anemia   . Stroke ([place])   . Anginal pain ([place])   . Arthritis   . Retinopathy   . Cardiomyopathy ([place])   . Carotid artery stenosis   . Hoarseness, chronic   . GERD (gastroesophageal reflux disease)   . Hemangioma of liver   . Weight loss, unintentional   . DDD (degenerative disc disease), lumbar   . Osteomyelitis of foot, right, acute ([place])   . Chronic kidney disease     Stage 3    Medications:  Scheduled:  . enoxaparin (LOVENOX) injection  30 mg Subcutaneous Q24H  . insulin aspart  0-9 Units Subcutaneous TID WC  . piperacillin-tazobactam (ZOSYN)  IV  3.375 g Intravenous Q8H  . senna  1 tablet Oral BID  . sodium chloride       Anti-infectives    Start     Dose/Rate Route Frequency Ordered Stop   05/27/15 1800  piperacillin-tazobactam (ZOSYN) IVPB 3.375 g     3.375 g 12.5 mL/hr over 240 Minutes Intravenous Every 8 hours 05/27/15 1640     05/27/15 1200  clindamycin  (CLEOCIN) IVPB 600 mg  Status:  Discontinued     600 mg 100 mL/hr over 30 Minutes Intravenous 3 times per day 05/27/15 1134 05/27/15 1624     Assessment: 70 yo F ongoing chronic foul-smelling discharge with significant amount of foul-smelling discharge. Hx right foot nonhealing diabetic ulcer with osteomyelitis chronic, DM.  Hx toe amputations.  Dr Cleda Mccreedy wants admitted for debridement and possible transmetatarsal amputation  Her recent most cultures revealed Enterobacter cloacae sensitive to ceftazidime, Zosyn and carbapenems  Plan:  Will order EI Zosyn 3.375gm IV q8h.  Follow renal fxn.  [name] 05/27/2015,5:20 PM

## 2015-05-27 NOTE — Consult Note (Signed)
Reason for Consult: Osteomyelitis right third metatarsal status post previous amputation of the first and second toes Referring Physician: [facility]  [name] is an 70 y.o. female.  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 She underwent revascularization procedure within the past month and then underwent amputation of her right first and second toes with partial ray resection of the first metatarsal. There was some dehiscence at both of the wound areas with continued full-thickness ulcerative areas. Over time the third metatarsal became exposed through the base of the wound and radiographic serial x-rays revealed new osteomyelitis in the third metatarsophalangeal joint. Decision was made for hospitalization and transmetatarsal amputation of the right forefoot.  Past Medical History  Diagnosis Date  . Hypertension   . Diabetes mellitus without complication ([place])   . Anemia   . Coronary artery disease   . CHF (congestive heart failure) ([place])   . Glaucoma   . Hyperlipidemia   . Neuropathy ([place])   . Chronic anemia   . Stroke ([place])   . Anginal pain ([place])   . Arthritis   . Retinopathy   . Cardiomyopathy ([place])   . Carotid artery stenosis   . Hoarseness, chronic   . GERD (gastroesophageal reflux disease)   . Hemangioma of liver   . Weight loss, unintentional   . DDD (degenerative disc disease), lumbar   . Osteomyelitis of foot, right, acute ([place])   . Chronic kidney disease     Stage 3    Past Surgical History  Procedure Laterality Date  . Cardiac catheterization    . Esophagogastroduodenoscopy (egd) with propofol N/A 09/24/2014    Elliott-gastritis & normal Billroth II changes   . Colonoscopy with propofol N/A 09/24/2014    Elliott-Incomplete secondary to prep  . Coronary angioplasty  2016    stent placed  . Bilroth ii procedure    . Colonoscopy with propofol N/A 11/12/2014    Procedure: COLONOSCOPY  WITH PROPOFOL;  Surgeon: Manya Silvas, MD;  Location: [facility] ENDOSCOPY;  Service: Endoscopy;  Laterality: N/A;  Trudee Kuster hole Right 12/06/2014    Procedure: Right burr hole  for subdural hematoma;  Surgeon: Newman Pies, MD;  Location: [facility] NEURO ORS;  Service: Neurosurgery;  Laterality: Right;  . Irrigation and debridement foot Right 02/01/2015    Procedure: IRRIGATION AND DEBRIDEMENT FOOT/RIGHT GREAT TOE;  Surgeon: [name], DPM;  Location: [facility] ORS;  Service: Podiatry;  Laterality: Right;  . Eye surgery Right     cataract removed  . Amputation toe Right 03/01/2015    Procedure: AMPUTATION  RIGHT GREAT TOE;  Surgeon: [name], DPM;  Location: [facility] ORS;  Service: Podiatry;  Laterality: Right;  . Irrigation and debridement foot Right 04/03/2015    Procedure: IRRIGATION AND DEBRIDEMENT FOOT;  Surgeon: Sharlotte Alamo, MD;  Location: [facility] ORS;  Service: Podiatry;  Laterality: Right;  . Peripheral vascular catheterization N/A 04/04/2015    Procedure: Abdominal Aortogram w/Lower Extremity;  Surgeon: Algernon Huxley, MD;  Location: [place] [facility] CV LAB;  Service: Cardiovascular;  Laterality: N/A;  . Peripheral vascular catheterization  04/04/2015    Procedure: Lower Extremity Intervention;  Surgeon: Algernon Huxley, MD;  Location: [place] CV LAB;  Service: Cardiovascular;;  . Brain surgery      Family History  Problem Relation Age of Onset  . Diabetes Mother   . Heart disease Father   . Diabetes Sister   . Lung cancer Brother   .  Cancer Brother   . Diabetes Brother   . Diabetes Sister     Social History:  reports that she has never smoked. She has never used smokeless tobacco. She reports that she does not drink alcohol or use illicit drugs.  Allergies:  Allergies  Allergen Reactions  . Codeine Other (See Comments)    Reaction:  Hallucinations    Medications:  Scheduled: . enoxaparin (LOVENOX) injection  30 mg Subcutaneous Q24H  . insulin aspart  0-9 Units Subcutaneous TID WC  .  piperacillin-tazobactam (ZOSYN)  IV  3.375 g Intravenous Q8H  . senna  1 tablet Oral BID  . sodium chloride        Results for orders placed or performed during the hospital encounter of 05/27/15 (from the past 48 hour(s))  Glucose, capillary     Status: Abnormal   Collection Time: 05/27/15 11:55 AM  Result Value Ref Range   Glucose-Capillary 174 (H) 65 - 99 mg/dL  CBC     Status: Abnormal   Collection Time: 05/27/15 11:56 AM  Result Value Ref Range   WBC 4.1 3.6 - 11.0 K/uL   RBC 2.92 (L) 3.80 - 5.20 MIL/uL   Hemoglobin 8.4 (L) 12.0 - 16.0 g/dL   HCT 26.1 (L) 35.0 - 47.0 %   MCV 89.4 80.0 - 100.0 fL   MCH 28.7 26.0 - 34.0 pg   MCHC 32.1 32.0 - 36.0 g/dL   RDW 20.9 (H) 11.5 - 14.5 %   Platelets 160 150 - 440 K/uL  Basic metabolic panel     Status: Abnormal   Collection Time: 05/27/15 11:56 AM  Result Value Ref Range   Sodium 135 135 - 145 mmol/L   Potassium 3.8 3.5 - 5.1 mmol/L   Chloride 109 101 - 111 mmol/L   CO2 18 (L) 22 - 32 mmol/L   Glucose, Bld 199 (H) 65 - 99 mg/dL   BUN 39 (H) 6 - 20 mg/dL   Creatinine, Ser 1.71 (H) 0.44 - 1.00 mg/dL   Calcium 7.6 (L) 8.9 - 10.3 mg/dL   GFR calc non Af Amer 29 (L) >60 mL/min   GFR calc Af Amer 34 (L) >60 mL/min    Comment: (NOTE) The eGFR has been calculated using the CKD EPI equation. This calculation has not been validated in all clinical situations. eGFR's persistently <60 mL/min signify possible Chronic Kidney Disease.    Anion gap 8 5 - 15  Protime-INR     Status: Abnormal   Collection Time: 05/27/15 11:56 AM  Result Value Ref Range   Prothrombin Time 15.2 (H) 11.4 - 15.0 seconds   INR 1.18   Glucose, capillary     Status: Abnormal   Collection Time: 05/27/15  4:22 PM  Result Value Ref Range   Glucose-Capillary 195 (H) 65 - 99 mg/dL    No results found.  Review of Systems  Constitutional: Negative.   HENT: Negative.   Eyes: Negative.   Respiratory: Negative.   Cardiovascular: Negative.   Gastrointestinal:  Negative.   Genitourinary: Negative.   Musculoskeletal: Negative.   Skin:       Chronic draining wound from the amputation sites on her right foot  Neurological:       Some numbness and paresthesias related to her diabetes but still has pain in the right foot  Endo/Heme/Allergies: Negative.   Psychiatric/Behavioral: Negative.    Blood pressure 142/48, pulse 58, temperature 97.8 F (36.6 C), temperature source Oral, resp. rate 18, height 5' 7"  (1.702  m), weight 64.864 kg (143 lb), SpO2 100 %. Physical Exam  Cardiovascular:  Previously pulses are been diminished. Currently the foot is bandaged and was not reassessed  Musculoskeletal:  Previous amputation of the right first and second toes. Radiographic osteomyelitis in the third metatarsal.  Neurological:  Some loss of protective sensation distally in the toes.  Skin:  The skin is warm dry and somewhat atrophic. Chronic open wounds at the previous amputation sites at the first and second toes. Full-thickness wound down to exposed capsular and bone tissue along the medial aspect of the third metatarsophalangeal joint    Assessment/Plan: Assessment: Osteomyelitis right third metatarsal. Diabetes with peripheral vascular disease. Plan: We will plan for transmetatarsal amputation of the right foot tomorrow afternoon. Consent form for transmetatarsal amputation right foot. The patient will be nothing by mouth after midnight. Discussed the risks of this surgery including continued infection, nonhealing the wound due to her vascular disease, or the need for higher amputation. Questions were invited and answered. We will plan for surgery tomorrow afternoon  Clearence Vitug W. 05/27/2015, 5:27 PM

## 2015-05-27 NOTE — Progress Notes (Signed)
Completed admission documents but patient would fall asleep during conversation numerous times.  Did not proceed with giving any po medications due to patients decreased alertness.

## 2015-05-27 NOTE — H&P (Signed)
[facility] [place] NAME: [name]    MR#:  [id number]  DATE OF BIRTH:  [birth year]  DATE OF ADMISSION:  05/27/2015  PRIMARY CARE PHYSICIAN: [facility]   REQUESTING/REFERRING PHYSICIAN: Dr. Sherren Mocha cline, podiatry  CHIEF COMPLAINT:  Right foot ongoing infection with foul smelling discharge Patient is status post first and second toe amputation in the past  HISTORY OF PRESENT ILLNESS:  [name]  is a 70 y.o. female with a known history of diabetes, coronary artery disease, hyperlipidemia, diabetic neuropathy with diabetic foot ulcer with osteomyelitis chronic and amputation of first and second toe on the right comes in from Dr. Olin Pia office with ongoing chronic foul-smelling discharge with significant amount of foul-smelling discharge. Dr. Cleda Mccreedy requested patient be admitted for debridement and possible transmetatarsal amputation. Patient denies any chest pain shortness of breath. She has done well with anesthesia in previous surgeries. PAST MEDICAL HISTORY:   Past Medical History  Diagnosis Date  . Hypertension   . Diabetes mellitus without complication ([place])   . Anemia   . Coronary artery disease   . CHF (congestive heart failure) ([place])   . Glaucoma   . Hyperlipidemia   . Neuropathy ([place])   . Chronic anemia   . Stroke ([place])   . Anginal pain ([place])   . Arthritis   . Retinopathy   . Cardiomyopathy ([place])   . Carotid artery stenosis   . Hoarseness, chronic   . GERD (gastroesophageal reflux disease)   . Hemangioma of liver   . Weight loss, unintentional   . DDD (degenerative disc disease), lumbar   . Osteomyelitis of foot, right, acute ([place])   . Chronic kidney disease     Stage 3    PAST SURGICAL HISTOIRY:   Past Surgical History  Procedure Laterality Date  . Cardiac catheterization    . Esophagogastroduodenoscopy (egd) with propofol N/A 09/24/2014    Elliott-gastritis & normal Billroth II  changes   . Colonoscopy with propofol N/A 09/24/2014    Elliott-Incomplete secondary to prep  . Coronary angioplasty  2016    stent placed  . Bilroth ii procedure    . Colonoscopy with propofol N/A 11/12/2014    Procedure: COLONOSCOPY WITH PROPOFOL;  Surgeon: Manya Silvas, MD;  Location: [facility] ENDOSCOPY;  Service: Endoscopy;  Laterality: N/A;  Trudee Kuster hole Right 12/06/2014    Procedure: Right burr hole  for subdural hematoma;  Surgeon: Newman Pies, MD;  Location[facility] NEURO ORS;  Service: Neurosurgery;  Laterality: Right;  . Irrigation and debridement foot Right 02/01/2015    Procedure: IRRIGATION AND DEBRIDEMENT FOOT/RIGHT GREAT TOE;  Surgeon: [name], DPM;  Location: [facility] ORS;  Service: Podiatry;  Laterality: Right;  . Eye surgery Right     cataract removed  . Amputation toe Right 03/01/2015    Procedure: AMPUTATION  RIGHT GREAT TOE;  Surgeon: [name], DPM;  Location: [facility] ORS;  Service: Podiatry;  Laterality: Right;  . Irrigation and debridement foot Right 04/03/2015    Procedure: IRRIGATION AND DEBRIDEMENT FOOT;  Surgeon: Sharlotte Alamo, MD;  Location: [facility] ORS;  Service: Podiatry;  Laterality: Right;  . Peripheral vascular catheterization N/A 04/04/2015    Procedure: Abdominal Aortogram w/Lower Extremity;  Surgeon: Algernon Huxley, MD;  Location: [facility] CV LAB;  Service: Cardiovascular;  Laterality: N/A;  . Peripheral vascular catheterization  04/04/2015    Procedure: Lower Extremity Intervention;  Surgeon: Algernon Huxley, MD;  Location: [facility]  CV LAB;  Service: Cardiovascular;;  . Brain surgery      SOCIAL HISTORY:   Social History  Substance Use Topics  . Smoking status: Never Smoker   . Smokeless tobacco: Never Used     Comment: quit many years ago  . Alcohol Use: No    FAMILY HISTORY:   Family History  Problem Relation Age of Onset  . Diabetes Mother   . Heart disease Father   . Diabetes Sister   . Lung cancer Brother   . Cancer Brother   . Diabetes  Brother   . Diabetes Sister     DRUG ALLERGIES:   Allergies  Allergen Reactions  . Codeine Other (See Comments)    Reaction:  Hallucinations    REVIEW OF SYSTEMS:  Review of Systems  Constitutional: Negative for fever, chills and weight loss.  HENT: Negative for ear discharge, ear pain and nosebleeds.   Eyes: Negative for blurred vision, pain and discharge.  Respiratory: Negative for sputum production, shortness of breath, wheezing and stridor.   Cardiovascular: Negative for chest pain, palpitations, orthopnea and PND.  Gastrointestinal: Negative for nausea, vomiting, abdominal pain and diarrhea.  Genitourinary: Negative for urgency and frequency.  Musculoskeletal: Negative for back pain and joint pain.  Neurological: Negative for sensory change, speech change, focal weakness and weakness.  Psychiatric/Behavioral: Negative for depression and hallucinations. The patient is not nervous/anxious.   All other systems reviewed and are negative.    MEDICATIONS AT HOME:   Prior to Admission medications   Medication Sig Start Date End Date Taking? Authorizing Provider  acetaminophen (TYLENOL) 325 MG tablet Take 2 tablets (650 mg total) by mouth every 4 (four) hours as needed for mild pain (or temp > 99 F). 12/13/14  Yes Delfina Redwood, MD  cloNIDine (CATAPRES) 0.2 MG tablet Take 1 tablet (0.2 mg total) by mouth 3 (three) times daily. 04/08/15  Yes Demetrios Loll, MD  docusate sodium (COLACE) 100 MG capsule Take 1 capsule (100 mg total) by mouth 2 (two) times daily. 02/05/15  Yes Lucilla Lame, MD  ferrous sulfate 325 (65 FE) MG tablet Take 325 mg by mouth daily.   Yes Historical Provider, MD  folic acid (FOLVITE) 1 MG tablet Take 1 tablet (1 mg total) by mouth daily. 02/22/15  Yes Lequita Asal, MD  furosemide (LASIX) 40 MG tablet Take 1 tablet (40 mg total) by mouth 2 (two) times daily. 11/02/14  Yes Alisa Graff, FNP  glimepiride (AMARYL) 2 MG tablet Take 1 mg by mouth daily.   Yes  Historical Provider, MD  hydrALAZINE (APRESOLINE) 100 MG tablet Take 100 mg by mouth 3 (three) times daily.    Yes Historical Provider, MD  minocycline (MINOCIN,DYNACIN) 100 MG capsule Take 1 capsule (100 mg total) by mouth 2 (two) times daily. 04/08/15  Yes Demetrios Loll, MD  omeprazole (PRILOSEC) 20 MG capsule Take 20 mg by mouth daily.   Yes Historical Provider, MD  potassium chloride SA (K-DUR,KLOR-CON) 20 MEQ tablet Take 1 tablet (20 mEq total) by mouth daily. 11/02/14  Yes Alisa Graff, FNP  propranolol (INDERAL) 20 MG tablet Take 40 mg by mouth 3 (three) times daily.   Yes Historical Provider, MD  ramipril (ALTACE) 5 MG capsule Take 5 mg by mouth daily.   Yes Historical Provider, MD  amoxicillin-clavulanate (AUGMENTIN) 875-125 MG tablet Take 1 tablet by mouth 2 (two) times daily. Reported on 05/27/2015    Historical Provider, MD  cefTAZidime 1 g in dextrose 5 %  50 mL Inject 1 g into the vein every 12 (twelve) hours. Patient not taking: Reported on 05/23/2015 04/08/15   Demetrios Loll, MD  feeding supplement, GLUCERNA SHAKE, (GLUCERNA SHAKE) LIQD Take 237 mLs by mouth 2 (two) times daily between meals. Patient not taking: Reported on 05/27/2015 09/20/14   Loletha Grayer, MD  HYDROcodone-acetaminophen [facility]) 5-325 MG tablet Take 1 tablet by mouth every 6 (six) hours as needed for moderate pain. Patient not taking: Reported on 05/27/2015 03/01/15   [name], DPM  isosorbide dinitrate (ISORDIL) 30 MG tablet Take 30 mg by mouth 2 (two) times daily. Reported on 05/27/2015    Historical Provider, MD  labetalol (NORMODYNE) 100 MG tablet Take 100 mg by mouth 2 (two) times daily. Reported on 05/27/2015    Historical Provider, MD  meloxicam (MOBIC) 7.5 MG tablet Take 7.5 mg by mouth daily. Reported on 05/27/2015    Historical Provider, MD  mirtazapine (REMERON) 15 MG tablet Take 15 mg by mouth at bedtime. Reported on 05/27/2015    Historical Provider, MD  nitroGLYCERIN (NITROSTAT) 0.4 MG SL tablet Place 0.4 mg  under the tongue every 5 (five) minutes as needed for chest pain.    Historical Provider, MD  OMEGA-3 FATTY ACIDS PO Take 1 capsule by mouth daily. Reported on 05/27/2015    Historical Provider, MD  simvastatin (ZOCOR) 20 MG tablet Take 20 mg by mouth at bedtime. Reported on 05/27/2015    Historical Provider, MD  traMADol (ULTRAM) 50 MG tablet Take 0.5-1 tablets (25-50 mg total) by mouth 3 (three) times daily as needed for moderate pain. Patient not taking: Reported on 05/27/2015 12/13/14   Delfina Redwood, MD      VITAL SIGNS:  Blood pressure 142/48, pulse 58, temperature 97.8 F (36.6 C), temperature source Oral, resp. rate 18, height 5\' 7"  (1.702 m), weight 64.864 kg (143 lb), SpO2 100 %.  PHYSICAL EXAMINATION:  GENERAL:  70 y.o.-year-old patient lying in the bed with no acute distress.  EYES: Pupils equal, round, reactive to light and accommodation. No scleral icterus. Extraocular muscles intact.  HEENT: Head atraumatic, normocephalic. Oropharynx and nasopharynx clear.  NECK:  Supple, no jugular venous distention. No thyroid enlargement, no tenderness.  LUNGS: Normal breath sounds bilaterally, no wheezing, rales,rhonchi or crepitation. No use of accessory muscles of respiration.  CARDIOVASCULAR: S1, S2 normal. No murmurs, rubs, or gallops.  ABDOMEN: Soft, nontender, nondistended. Bowel sounds present. No organomegaly or mass.  EXTREMITIES: No pedal edema, cyanosis, or clubbing. Patient has chronic nonhealing diabetic foot on the right. Dressing present. Positive discharge. NEUROLOGIC: Cranial nerves II through XII are intact. Muscle strength 5/5 in all extremities. Sensation intact. Gait not checked.  PSYCHIATRIC: The patient is alert and oriented x 3.  SKIN: No obvious rash, lesion, or ulcer.   LABORATORY PANEL:   CBC  Recent Labs Lab 05/27/15 1156  WBC 4.1  HGB 8.4*  HCT 26.1*  PLT 160    ------------------------------------------------------------------------------------------------------------------  Chemistries   Recent Labs Lab 05/27/15 1156  NA 135  K 3.8  CL 109  CO2 18*  GLUCOSE 199*  BUN 39*  CREATININE 1.71*  CALCIUM 7.6*    EKG:  Normal sinus rhythm no acute ST-T changes.  IMPRESSION AND PLAN:  [name]  is a 70 y.o. female with a known history of diabetes, coronary artery disease, hyperlipidemia, diabetic neuropathy with diabetic foot ulcer with osteomyelitis chronic and amputation of first and second toe on the right comes in from Dr. Olin Pia office with ongoing  chronic foul-smelling discharge with significant amount of foul-smelling discharge.  1. the right foot nonhealing diabetic ulcer with osteomyelitis chronic -Admit to medical floor -IV fluids -We'll start patient on IV Zosyn. Her recent most cultures revealed Enterobacter cloacae sensitive to ceftazidime, Zosyn and carbapenems -Podiatry consultation with Dr.Cline -EKG does not show any acute changes. Patient is at low to moderate risk for surgery.  2. Type 2 diabetes -Continue by mouth glimepiride, insulin, sliding scale insulin  3. Hyperlipidemia on simvastatin  4. Hypertension continue labetalol, clonidine, hydralazine  5. History of coronary artery disease continue imdur   All the records are reviewed and case discussed with ED provider. Management plans discussed with the patient, family and they are in agreement.  CODE STATUS: Full  TOTAL TIME TAKING CARE OF THIS PATIENT: 45 minutes.    [name] M.D on 05/27/2015 at 4:27 PM  Between 7am to 6pm - Pager - [phone]  After 6pm go to [email or web address] - password [email or web address] [facility]  Office  [phone]  CC: Primary care physician; [facility] Acute C

## 2015-05-27 NOTE — Progress Notes (Signed)
Anticoagulation monitoring(Lovenox):  69yo F  ordered Lovenox 40 mg Q24h  Filed Weights   05/27/15 1300  Weight: 143 lb (64.864 kg)   BMI 22.4   Lab Results  Component Value Date   CREATININE 1.71* 05/27/2015   CREATININE 1.35* 05/23/2015   CREATININE 1.45* 04/08/2015   Estimated Creatinine Clearance: 29.8 mL/min (by C-G formula based on Cr of 1.71).   Hemoglobin & Hematocrit     Component Value Date/Time   HGB 8.4* 05/27/2015 1156   HGB 10.9* 05/05/2014 0108   HCT 26.1* 05/27/2015 1156   HCT 33.9* 05/05/2014 0108   Plt 160  Per Protocol for Patient with estCrcl< 30 ml/min and BMI < 40, will transition to Lovenox 30 mg Q24h     Chinita Greenland PharmD Clinical Pharmacist 05/27/2015 2:55 PM

## 2015-05-28 ENCOUNTER — Inpatient Hospital Stay: Payer: Commercial Managed Care - HMO | Admitting: Anesthesiology

## 2015-05-28 ENCOUNTER — Encounter
Admission: RE | Disposition: A | Discharge status: Skilled Nursing Facility | Payer: Self-pay | Source: Ambulatory Visit | Attending: Internal Medicine

## 2015-05-28 HISTORY — PX: TRANSMETATARSAL AMPUTATION: SHX6197

## 2015-05-28 LAB — CREATININE, SERUM
CREATININE: 1.55 mg/dL — AB (ref 0.44–1.00)
GFR, EST AFRICAN AMERICAN: 38 mL/min — AB (ref >60)
GFR, EST NON AFRICAN AMERICAN: 33 mL/min — AB (ref >60)

## 2015-05-28 LAB — GLUCOSE, CAPILLARY
GLUCOSE-CAPILLARY: 165 mg/dL — AB (ref 65–99)
Glucose-Capillary: 83 mg/dL (ref 65–99)
Glucose-Capillary: 78 mg/dL (ref 65–99)
Glucose-Capillary: 69 mg/dL (ref 65–99)
Glucose-Capillary: 115 mg/dL — ABNORMAL HIGH (ref 65–99)

## 2015-05-28 SURGERY — AMPUTATION, FOOT, TRANSMETATARSAL
Anesthesia: General | Laterality: Right | Wound class: Clean Contaminated

## 2015-05-28 MED ORDER — FENTANYL CITRATE (PF) 100 MCG/2ML IJ SOLN: 25.0000 ug | INTRAMUSCULAR | Status: DC | PRN

## 2015-05-28 MED ORDER — PROPOFOL 10 MG/ML IV BOLUS
INTRAVENOUS | Status: DC | PRN
  Administered 2015-05-28: 100 mg via INTRAVENOUS

## 2015-05-28 MED ORDER — SODIUM CHLORIDE 0.9 % IV SOLN: INTRAVENOUS | Status: DC

## 2015-05-28 MED ORDER — ONDANSETRON HCL 4 MG/2ML IJ SOLN: 4.0000 mg | Freq: Once | INTRAMUSCULAR | Status: DC | PRN

## 2015-05-28 MED ORDER — SODIUM CHLORIDE 0.9% FLUSH
3.0000 mL | INTRAVENOUS | Status: DC | PRN
Start: 2015-05-28 — End: 2015-05-30

## 2015-05-28 MED ORDER — LIDOCAINE HCL (CARDIAC) 20 MG/ML IV SOLN
INTRAVENOUS | Status: DC | PRN
  Administered 2015-05-28: 100 mg via INTRAVENOUS

## 2015-05-28 MED ORDER — MORPHINE SULFATE (PF) 2 MG/ML IV SOLN
2.0000 mg | INTRAVENOUS | Status: DC | PRN
  Administered 2015-05-28: 2 mg via INTRAVENOUS
  Filled 2015-05-28: qty 1

## 2015-05-28 MED ORDER — PHENYLEPHRINE HCL 10 MG/ML IJ SOLN
INTRAMUSCULAR | Status: DC | PRN
  Administered 2015-05-28 (×6): 100 ug via INTRAVENOUS

## 2015-05-28 MED ORDER — ONDANSETRON HCL 4 MG/2ML IJ SOLN
INTRAMUSCULAR | Status: DC | PRN
  Administered 2015-05-28: 4 mg via INTRAVENOUS

## 2015-05-28 MED ORDER — CEFAZOLIN SODIUM-DEXTROSE 2-3 GM-% IV SOLR
2.0000 g | Freq: Once | INTRAVENOUS | Status: AC
  Administered 2015-05-28: 2 g via INTRAVENOUS

## 2015-05-28 MED ORDER — KETAMINE HCL 10 MG/ML IJ SOLN
INTRAMUSCULAR | Status: DC | PRN
  Administered 2015-05-28: 30 mg via INTRAVENOUS

## 2015-05-28 MED ORDER — SODIUM CHLORIDE 0.9% FLUSH
3.0000 mL | Freq: Two times a day (BID) | INTRAVENOUS | Status: DC
  Administered 2015-05-29: 3 mL via INTRAVENOUS

## 2015-05-28 MED ORDER — EPHEDRINE SULFATE 50 MG/ML IJ SOLN
INTRAMUSCULAR | Status: DC | PRN
  Administered 2015-05-28: 5 mg via INTRAVENOUS
  Administered 2015-05-28 (×2): 10 mg via INTRAVENOUS

## 2015-05-28 MED ORDER — ENOXAPARIN SODIUM 40 MG/0.4ML ~~LOC~~ SOLN
40.0000 mg | SUBCUTANEOUS | Status: DC
  Filled 2015-05-28: qty 0.4

## 2015-05-28 MED ORDER — FENTANYL CITRATE (PF) 100 MCG/2ML IJ SOLN
INTRAMUSCULAR | Status: DC | PRN
  Administered 2015-05-28 (×3): 25 ug via INTRAVENOUS

## 2015-05-28 MED ORDER — OXYCODONE-ACETAMINOPHEN 5-325 MG PO TABS
1.0000 | ORAL_TABLET | ORAL | Status: DC | PRN
  Administered 2015-05-30 (×2): 1 via ORAL
  Filled 2015-05-28 (×2): qty 1

## 2015-05-28 MED ORDER — BUPIVACAINE HCL (PF) 0.5 % IJ SOLN
INTRAMUSCULAR | Status: DC | PRN
  Administered 2015-05-28: 20 mL

## 2015-05-28 MED ORDER — SODIUM CHLORIDE 0.9 % IV SOLN: 250.0000 mL | INTRAVENOUS | Status: DC | PRN

## 2015-05-28 MED ORDER — MORPHINE SULFATE (PF) 4 MG/ML IV SOLN: 4.0000 mg | INTRAVENOUS | Status: DC | PRN

## 2015-05-28 SURGICAL SUPPLY — 49 items
BAG COUNTER SPONGE EZ (MISCELLANEOUS) ×1 IMPLANT
BAG SPNG 4X4 CLR HAZ (MISCELLANEOUS)
BANDAGE ELASTIC 4 LF NS (GAUZE/BANDAGES/DRESSINGS) ×3 IMPLANT
BLADE SAW 9.0X18.5X0.4 (BLADE) ×2 IMPLANT
BNDG CMPR MED 5X4 ELC HKLP NS (GAUZE/BANDAGES/DRESSINGS) ×1
BNDG COHESIVE 4X5 TAN STRL (GAUZE/BANDAGES/DRESSINGS) ×3 IMPLANT
BNDG ESMARK 4X12 TAN STRL LF (GAUZE/BANDAGES/DRESSINGS) ×1 IMPLANT
BNDG GAUZE 4.5X4.1 6PLY STRL (MISCELLANEOUS) ×3 IMPLANT
CANISTER SUCT 1200ML W/VALVE (MISCELLANEOUS) ×3 IMPLANT
COUNTER SPONGE BAG EZ (MISCELLANEOUS)
CUFF TOURN 18 STER (MISCELLANEOUS) ×3 IMPLANT
CUFF TOURN DUAL PL 12 NO SLV (MISCELLANEOUS) ×1 IMPLANT
DRAIN PENROSE 1/4X12 LTX (DRAIN) ×1 IMPLANT
DRAPE FLUOR MINI C-ARM 54X84 (DRAPES) ×3 IMPLANT
DURAPREP 26ML APPLICATOR (WOUND CARE) ×3 IMPLANT
ELECT REM PT RETURN 9FT ADLT (ELECTROSURGICAL) ×3
ELECTRODE REM PT RTRN 9FT ADLT (ELECTROSURGICAL) ×1 IMPLANT
GAUZE FLUFF 18X24 1PLY STRL (GAUZE/BANDAGES/DRESSINGS) ×3 IMPLANT
GAUZE PETRO XEROFOAM 1X8 (MISCELLANEOUS) ×3 IMPLANT
GAUZE SPONGE 4X4 12PLY STRL (GAUZE/BANDAGES/DRESSINGS) ×3 IMPLANT
GLOVE BIO SURGEON STRL SZ7.5 (GLOVE) ×11 IMPLANT
GLOVE INDICATOR 8.0 STRL GRN (GLOVE) ×3 IMPLANT
GOWN STRL REUS W/ TWL LRG LVL3 (GOWN DISPOSABLE) ×2 IMPLANT
GOWN STRL REUS W/TWL LRG LVL3 (GOWN DISPOSABLE) ×6
HANDLE YANKAUER SUCT BULB TIP (MISCELLANEOUS) ×3 IMPLANT
HANDPIECE VERSAJET DEBRIDEMENT (MISCELLANEOUS) ×3 IMPLANT
KIT PREVENA INCISION MGT 13 (CANNISTER) ×2 IMPLANT
LABEL OR SOLS (LABEL) ×3 IMPLANT
NDL SAFETY 18GX1.5 (NEEDLE) ×3 IMPLANT
NS IRRIG 500ML POUR BTL (IV SOLUTION) ×3 IMPLANT
PACK EXTREMITY ARMC (MISCELLANEOUS) ×3 IMPLANT
PAD ABD DERMACEA PRESS 5X9 (GAUZE/BANDAGES/DRESSINGS) ×3 IMPLANT
PENCIL ELECTRO HAND CTR (MISCELLANEOUS) ×3 IMPLANT
SOL .9 NS 3000ML IRR  AL (IV SOLUTION) ×2
SOL .9 NS 3000ML IRR AL (IV SOLUTION) ×1
SOL .9 NS 3000ML IRR UROMATIC (IV SOLUTION) ×1 IMPLANT
SOL PREP PVP 2OZ (MISCELLANEOUS)
SOLUTION PREP PVP 2OZ (MISCELLANEOUS) ×1 IMPLANT
SPONGE LAP 18X18 5 PK (GAUZE/BANDAGES/DRESSINGS) ×1 IMPLANT
STOCKINETTE M/LG 89821 (MISCELLANEOUS) ×3 IMPLANT
STRAP SAFETY BODY (MISCELLANEOUS) ×3 IMPLANT
SUT ETHILON 3-0 KS 30 BLK (SUTURE) ×4 IMPLANT
SUT ETHILON 4-0 (SUTURE) ×6
SUT ETHILON 4-0 FS2 18XMFL BLK (SUTURE) ×2
SUT VIC AB 3-0 SH 27 (SUTURE) ×3
SUT VIC AB 3-0 SH 27X BRD (SUTURE) IMPLANT
SUT VIC AB 4-0 FS2 27 (SUTURE) ×6 IMPLANT
SUTURE ETHLN 4-0 FS2 18XMF BLK (SUTURE) ×2 IMPLANT
SYRINGE 10CC LL (SYRINGE) ×6 IMPLANT

## 2015-05-28 NOTE — Plan of Care (Signed)
Report given to OR. CHG completed X2.  Dentures removed - no jewelry or clothing intact. Pt has been NPO since midnight.  Teds in place.  Zosyn hanging. All meds held except beta blockers with sip of water.  No insulin coverage given since BS did not indicate.

## 2015-05-28 NOTE — Transfer of Care (Signed)
Immediate Anesthesia Transfer of Care Note  Patient: [name]  Procedure(s) Performed: Procedure(s): TRANSMETATARSAL AMPUTATION (Right)  Patient Location: PACU  Anesthesia Type:General  Level of Consciousness: awake, alert  and oriented  Airway & Oxygen Therapy: Patient Spontanous Breathing and Patient connected to nasal cannula oxygen  Post-op Assessment: Report given to RN, Post -op Vital signs reviewed and stable and Patient moving all extremities X 4  Post vital signs: Reviewed and stable  Last Vitals:  Filed Vitals:   05/28/15 1137 05/28/15 1347  BP: 173/57 126/51  Pulse: 67 65  Temp: 36.2 C 36 C  Resp: 16 12    Complications: No apparent anesthesia complications

## 2015-05-28 NOTE — Care Management Note (Addendum)
Case Management Note  Patient Details  Name: [name] MRN: [id number] Date of Birth: 09/29/1945  Subjective/Objective:                  Met with patient to discuss discharge planning. She was sleepy but appropriately answered discharge planning questions. She states she lives with her husband. She states "she and her husband are able to drive". She has used [facility] in the past and would like to use them again. She states she has a walker and wheelchair in the home for use. Surgery pending today to foot.   Action/Plan: RNCM will continue to follow. Referral called to [facility] with [place]. Unsure of discharge needs at this time.   Expected Discharge Date:                  Expected Discharge Plan:     In-House Referral:     Discharge planning Services  CM Consult  Post Acute Care Choice:  Home Health Choice offered to:  Patient  DME Arranged:    DME Agency:     HH Arranged:    [place] Agency:  [place]  Status of Service:  In process, will continue to follow  Medicare Important Message Given:    Date Medicare IM Given:    Medicare IM give by:    Date Additional Medicare IM Given:    Additional Medicare Important Message give by:     If discussed at [place] of Stay Meetings, dates discussed:    Additional Comments: Correction: Patient is currently open to [place] for [facility] and will need resumption of care orders at discharge.  [name], RN 05/28/2015, 8:38 AM

## 2015-05-28 NOTE — Progress Notes (Signed)
Order for enoxaparin 30 mg subcutaneously daily for DVT prophylaxis was changed to 40 mg daily per anticoagulation protocol due to CrCl > 30 mL/min.  Lenis Noon, PharmD Clinical Pharmacist 9:27 AM

## 2015-05-28 NOTE — Anesthesia Procedure Notes (Signed)
Procedure Name: LMA Insertion Date/Time: 05/28/2015 12:32 PM Performed by: Alda Berthold Pre-anesthesia Checklist: Patient identified, Patient being monitored, Timeout performed, Emergency Drugs available and Suction available Patient Re-evaluated:Patient Re-evaluated prior to inductionOxygen Delivery Method: Circle system utilized Preoxygenation: Pre-oxygenation with 100% oxygen Intubation Type: IV induction Ventilation: Mask ventilation without difficulty LMA: LMA inserted LMA Size: 4.0 Tube type: Oral Number of attempts: 1 Placement Confirmation: positive ETCO2 and breath sounds checked- equal and bilateral Tube secured with: Tape Dental Injury: Teeth and Oropharynx as per pre-operative assessment

## 2015-05-28 NOTE — Interval H&P Note (Signed)
History and Physical Interval Note:  05/28/2015 12:17 PM  [name]  has presented today for surgery, with the diagnosis of OSTEOMYLITIS  The various methods of treatment have been discussed with the patient and family. After consideration of risks, benefits and other options for treatment, the patient has consented to  Procedure(s): TRANSMETATARSAL AMPUTATION (Right) as a surgical intervention .  The patient's history has been reviewed, patient examined, no change in status, stable for surgery.  I have reviewed the patient's chart and labs.  Questions were answered to the patient's satisfaction.     Shreya Lacasse W.

## 2015-05-28 NOTE — Progress Notes (Addendum)
[name] at [place] NAME: [name]    MR#:  [id number]  DATE OF BIRTH:  10/16/1945  SUBJECTIVE:    No acute issues overnight. Patient is nothing by mouth for transmetatarsal amputation this morning.  REVIEW OF SYSTEMS:    Review of Systems  Constitutional: Negative for fever, chills and malaise/fatigue.  HENT: Negative for sore throat.   Eyes: Negative for blurred vision.  Respiratory: Negative for cough, hemoptysis, shortness of breath and wheezing.   Cardiovascular: Negative for chest pain, palpitations and leg swelling.  Gastrointestinal: Negative for nausea, vomiting, abdominal pain, diarrhea and blood in stool.  Genitourinary: Negative for dysuria.  Musculoskeletal: Positive for joint pain. Negative for back pain.  Skin:       Osteo right 3rd metatarsal  Neurological: Negative for dizziness, tremors and headaches.  Endo/Heme/Allergies: Does not bruise/bleed easily.    Tolerating Diet: Nothing by mouth      DRUG ALLERGIES:   Allergies  Allergen Reactions  . Codeine Other (See Comments)    Reaction:  Hallucinations    VITALS:  Blood pressure 152/64, pulse 68, temperature 99 F (37.2 C), temperature source Oral, resp. rate 19, height 5\' 7"  (1.702 m), weight 64.864 kg (143 lb), SpO2 97 %.  PHYSICAL EXAMINATION:   Physical Exam  Constitutional: She is oriented to person, place, and time and well-developed, well-nourished, and in no distress. No distress.  HENT:  Head: Normocephalic.  Eyes: No scleral icterus.  Neck: Normal range of motion. Neck supple. No JVD present. No tracheal deviation present.  Cardiovascular: Normal rate, regular rhythm and normal heart sounds.  Exam reveals no gallop and no friction rub.   No murmur heard. Pulmonary/Chest: Effort normal and breath sounds normal. No respiratory distress. She has no wheezes. She has no rales. She exhibits no tenderness.  Abdominal: Soft. Bowel  sounds are normal. She exhibits no distension and no mass. There is no tenderness. There is no rebound and no guarding.  Musculoskeletal: Normal range of motion. She exhibits no edema.  Neurological: She is alert and oriented to person, place, and time.  Skin: Skin is warm. No erythema.  Foot is wrapped  Psychiatric: Affect and judgment normal.      LABORATORY PANEL:   CBC  Recent Labs Lab 05/27/15 1156  WBC 4.1  HGB 8.4*  HCT 26.1*  PLT 160   ------------------------------------------------------------------------------------------------------------------  Chemistries   Recent Labs Lab 05/27/15 1156 05/28/15 0601  NA 135  --   K 3.8  --   CL 109  --   CO2 18*  --   GLUCOSE 199*  --   BUN 39*  --   CREATININE 1.71* 1.55*  CALCIUM 7.6*  --    ------------------------------------------------------------------------------------------------------------------  Cardiac Enzymes No results for input(s): TROPONINI in the last 168 hours. ------------------------------------------------------------------------------------------------------------------  RADIOLOGY:  No results found.   ASSESSMENT AND PLAN:   70 year old female with a history of diabetes, CAD and peripheral vascular disease who presents with osteomyelitis in the third metatarsal phalangeal joint.   1. Osteomyelitis of the right third metatarsal in a patient with diabetes and peripheral vascular disease: Patient is already status post amputation of her first and second toe due to ostium myelitis and now will need a transmetatarsal crepitation of the right foot. This is planned for this afternoon. Appreciate podiatry consultation. Continue Zosyn. Follow up on wound culture results. I will also have ID follow the patient for recommend patient for antibiotics and duration of  antibiotics.  2. Diabetes: As per diabetes coordinator consultation I will discontinue Amaryl, continue siding scale insulin and and change  POCT to every 4 hours.  3. Essential hypertension: Continue clonidine, hydralazine, isosorbide, ramipril, propanolol and labetalol.  4. Hyperlipidemia: Continue simvastatin  5. History of CAD: Continue isosorbide, simvastatin and propanolol.  6. Anemia of chronic disease and iron deficiency: Continue ferrous sulfate and folic acid.  7. Chronic kidney disease stage III: Patient's creatinine remains relatively stable. Continue to monitor. Hold for toxic agents.  Management plans discussed with the patient and she  is in agreement.  CODE STATUS: FULL  TOTAL TIME TAKING CARE OF THIS PATIENT: 30 minutes.     POSSIBLE D/C 3-4 days, DEPENDING ON CLINICAL CONDITION.   [name] M.D on 05/28/2015 at 10:11 AM  Between 7am to 6pm - Pager - [phone] After 6pm go to [email or web address] - password [email or web address] [facility]  Office  [phone]  CC: Primary care physician; [facility] Acute C  Note: This dictation was prepared with Dragon dictation along with smaller phrase technology. Any transcriptional errors that result from this process are unintentional.

## 2015-05-28 NOTE — Anesthesia Preprocedure Evaluation (Signed)
Anesthesia Evaluation  Patient identified by MRN, date of birth, ID band Patient awake    Reviewed: Allergy & Precautions, H&P , NPO status , Patient's Chart, lab work & pertinent test results  History of Anesthesia Complications Negative for: history of anesthetic complications  Airway Mallampati: III  TM Distance: >3 FB Neck ROM: limited    Dental  (+) Poor Dentition, Chipped, Missing, Partial Lower, Partial Upper   Pulmonary neg pulmonary ROS, neg shortness of breath,    Pulmonary exam normal breath sounds clear to auscultation       Cardiovascular Exercise Tolerance: Good hypertension, (-) angina+ CAD, + Past MI, + Cardiac Stents, + Peripheral Vascular Disease and +CHF  (-) CABG and (-) DOE Normal cardiovascular exam Rhythm:regular Rate:Normal     Neuro/Psych neg Seizures CVA, Residual Symptoms negative psych ROS   GI/Hepatic Neg liver ROS, GERD  Controlled,  Endo/Other  diabetes, Poorly Controlled, Type 2, Insulin Dependent  Renal/GU Renal disease  negative genitourinary   Musculoskeletal  (+) Arthritis ,   Abdominal   Peds  Hematology negative hematology ROS (+)   Anesthesia Other Findings Past Medical History:   Hypertension                                                 Diabetes mellitus without complication (HCC)                 Anemia                                                       Coronary artery disease                                      CHF (congestive heart failure) (HCC)                         Glaucoma                                                     Hyperlipidemia                                               Chronic kidney disease                                       Neuropathy (HCC)                                             Chronic anemia  Stroke ([place])                                                 Anginal pain (HCC)                                            Arthritis                                                    Retinopathy                                                  Cardiomyopathy ([place])                                         Carotid artery stenosis                                      Hoarseness, chronic                                          GERD (gastroesophageal reflux disease)                       Hemangioma of liver                                          Weight loss, unintentional                                  Past Surgical History:   CARDIAC CATHETERIZATION                                       ESOPHAGOGASTRODUODENOSCOPY (EGD) WITH PROPOFOL  N/A 09/24/2014      Comment:Elliott-gastritis & normal Billroth II changes    COLONOSCOPY WITH PROPOFOL                       N/A 09/24/2014      Comment:Elliott-Incomplete secondary to prep   CORONARY ANGIOPLASTY                             2016           Comment:stent placed   BILROTH II PROCEDURE  COLONOSCOPY WITH PROPOFOL                       N/A 11/12/2014      Comment:Procedure: COLONOSCOPY WITH PROPOFOL;  Surgeon:              Manya Silvas, MD;  Location: [facility]               ENDOSCOPY;  Service: Endoscopy;  Laterality:               N/A;   BURR HOLE                                       Right 12/06/2014       Comment:Procedure: Right burr hole  for subdural               hematoma;  Surgeon: Newman Pies, MD;                Location: [facility]URO ORS;  Service: Neurosurgery;              Laterality: Right;   IRRIGATION AND DEBRIDEMENT FOOT                 Right 02/01/2015      Comment:Procedure: IRRIGATION AND DEBRIDEMENT               FOOT/RIGHT GREAT TOE;  Surgeon: [name],               DPM;  Location: [facility] ORS;  Service: Podiatry;                Laterality: Right;   EYE SURGERY                                     Right                Comment:cataract removed   AMPUTATION TOE                                   Right 03/01/2015     Comment:Procedure: AMPUTATION  RIGHT GREAT TOE;                Surgeon: [name], DPM;  Location: [facility]               ORS;  Service: Podiatry;  Laterality: Right;  BMI    Body Mass Index   23.48 kg/m 2      Reproductive/Obstetrics negative OB ROS                             Anesthesia Physical  Anesthesia Plan  ASA: IV  Anesthesia Plan: MAC, General and Regional   Post-op Pain Management: GA combined w/ Regional for post-op pain   Induction:   Airway Management Planned:   Additional Equipment:   Intra-op Plan:   Post-operative Plan:   Informed Consent: I have reviewed the patients History and Physical, chart, labs and discussed the procedure including the risks, benefits and alternatives for the proposed anesthesia with the patient or authorized representative who has indicated his/her understanding and acceptance.   Dental Advisory Given  Plan Discussed with: Anesthesiologist, CRNA  and Surgeon  Anesthesia Plan Comments:         Anesthesia Quick Evaluation

## 2015-05-28 NOTE — H&P (View-Only) (Signed)
Reason for Consult: Osteomyelitis right third metatarsal status post previous amputation of the first and second toes Referring Physician: [facility]  [name] is an 70 y.o. female.  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 She underwent revascularization procedure within the past month and then underwent amputation of her right first and second toes with partial ray resection of the first metatarsal. There was some dehiscence at both of the wound areas with continued full-thickness ulcerative areas. Over time the third metatarsal became exposed through the base of the wound and radiographic serial x-rays revealed new osteomyelitis in the third metatarsophalangeal joint. Decision was made for hospitalization and transmetatarsal amputation of the right forefoot.  Past Medical History  Diagnosis Date  . Hypertension   . Diabetes mellitus without complication ([place])   . Anemia   . Coronary artery disease   . CHF (congestive heart failure) ([place])   . Glaucoma   . Hyperlipidemia   . Neuropathy ([place])   . Chronic anemia   . Stroke ([place])   . Anginal pain ([place])   . Arthritis   . Retinopathy   . Cardiomyopathy ([place])   . Carotid artery stenosis   . Hoarseness, chronic   . GERD (gastroesophageal reflux disease)   . Hemangioma of liver   . Weight loss, unintentional   . DDD (degenerative disc disease), lumbar   . Osteomyelitis of foot, right, acute ([place])   . Chronic kidney disease     Stage 3    Past Surgical History  Procedure Laterality Date  . Cardiac catheterization    . Esophagogastroduodenoscopy (egd) with propofol N/A 09/24/2014    Elliott-gastritis & normal Billroth II changes   . Colonoscopy with propofol N/A 09/24/2014    Elliott-Incomplete secondary to prep  . Coronary angioplasty  2016    stent placed  . Bilroth ii procedure    . Colonoscopy with propofol N/A 11/12/2014    Procedure: COLONOSCOPY  WITH PROPOFOL;  Surgeon: Manya Silvas, MD;  Location: [facility] ENDOSCOPY;  Service: Endoscopy;  Laterality: N/A;  Trudee Kuster hole Right 12/06/2014    Procedure: Right burr hole  for subdural hematoma;  Surgeon: Newman Pies, MD;  Location: [facility] NEURO ORS;  Service: Neurosurgery;  Laterality: Right;  . Irrigation and debridement foot Right 02/01/2015    Procedure: IRRIGATION AND DEBRIDEMENT FOOT/RIGHT GREAT TOE;  Surgeon: [name], DPM;  Location: [facility] ORS;  Service: Podiatry;  Laterality: Right;  . Eye surgery Right     cataract removed  . Amputation toe Right 03/01/2015    Procedure: AMPUTATION  RIGHT GREAT TOE;  Surgeon: [name], DPM;  Location: [facility] ORS;  Service: Podiatry;  Laterality: Right;  . Irrigation and debridement foot Right 04/03/2015    Procedure: IRRIGATION AND DEBRIDEMENT FOOT;  Surgeon: Sharlotte Alamo, MD;  Location: [facility] ORS;  Service: Podiatry;  Laterality: Right;  . Peripheral vascular catheterization N/A 04/04/2015    Procedure: Abdominal Aortogram w/Lower Extremity;  Surgeon: Algernon Huxley, MD;  Location: [place] LAB;  Service: Cardiovascular;  Laterality: N/A;  . Peripheral vascular catheterization  04/04/2015    Procedure: Lower Extremity Intervention;  Surgeon: Algernon Huxley, MD;  Location: [place] [facility];  Service: Cardiovascular;;  . Brain surgery      Family History  Problem Relation Age of Onset  . Diabetes Mother   . Heart disease Father   . Diabetes Sister   . Lung cancer Brother   .  Cancer Brother   . Diabetes Brother   . Diabetes Sister     Social History:  reports that she has never smoked. She has never used smokeless tobacco. She reports that she does not drink alcohol or use illicit drugs.  Allergies:  Allergies  Allergen Reactions  . Codeine Other (See Comments)    Reaction:  Hallucinations    Medications:  Scheduled: . enoxaparin (LOVENOX) injection  30 mg Subcutaneous Q24H  . insulin aspart  0-9 Units Subcutaneous TID WC  .  piperacillin-tazobactam (ZOSYN)  IV  3.375 g Intravenous Q8H  . senna  1 tablet Oral BID  . sodium chloride        Results for orders placed or performed during the hospital encounter of 05/27/15 (from the past 48 hour(s))  Glucose, capillary     Status: Abnormal   Collection Time: 05/27/15 11:55 AM  Result Value Ref Range   Glucose-Capillary 174 (H) 65 - 99 mg/dL  CBC     Status: Abnormal   Collection Time: 05/27/15 11:56 AM  Result Value Ref Range   WBC 4.1 3.6 - 11.0 K/uL   RBC 2.92 (L) 3.80 - 5.20 MIL/uL   Hemoglobin 8.4 (L) 12.0 - 16.0 g/dL   HCT 26.1 (L) 35.0 - 47.0 %   MCV 89.4 80.0 - 100.0 fL   MCH 28.7 26.0 - 34.0 pg   MCHC 32.1 32.0 - 36.0 g/dL   RDW 20.9 (H) 11.5 - 14.5 %   Platelets 160 150 - 440 K/uL  Basic metabolic panel     Status: Abnormal   Collection Time: 05/27/15 11:56 AM  Result Value Ref Range   Sodium 135 135 - 145 mmol/L   Potassium 3.8 3.5 - 5.1 mmol/L   Chloride 109 101 - 111 mmol/L   CO2 18 (L) 22 - 32 mmol/L   Glucose, Bld 199 (H) 65 - 99 mg/dL   BUN 39 (H) 6 - 20 mg/dL   Creatinine, Ser 1.71 (H) 0.44 - 1.00 mg/dL   Calcium 7.6 (L) 8.9 - 10.3 mg/dL   GFR calc non Af Amer 29 (L) >60 mL/min   GFR calc Af Amer 34 (L) >60 mL/min    Comment: (NOTE) The eGFR has been calculated using the CKD EPI equation. This calculation has not been validated in all clinical situations. eGFR's persistently <60 mL/min signify possible Chronic Kidney Disease.    Anion gap 8 5 - 15  Protime-INR     Status: Abnormal   Collection Time: 05/27/15 11:56 AM  Result Value Ref Range   Prothrombin Time 15.2 (H) 11.4 - 15.0 seconds   INR 1.18   Glucose, capillary     Status: Abnormal   Collection Time: 05/27/15  4:22 PM  Result Value Ref Range   Glucose-Capillary 195 (H) 65 - 99 mg/dL    No results found.  Review of Systems  Constitutional: Negative.   HENT: Negative.   Eyes: Negative.   Respiratory: Negative.   Cardiovascular: Negative.   Gastrointestinal:  Negative.   Genitourinary: Negative.   Musculoskeletal: Negative.   Skin:       Chronic draining wound from the amputation sites on her right foot  Neurological:       Some numbness and paresthesias related to her diabetes but still has pain in the right foot  Endo/Heme/Allergies: Negative.   Psychiatric/Behavioral: Negative.    Blood pressure 142/48, pulse 58, temperature 97.8 F (36.6 C), temperature source Oral, resp. rate 18, height 5' 7"  (1.702  m), weight 64.864 kg (143 lb), SpO2 100 %. Physical Exam  Cardiovascular:  Previously pulses are been diminished. Currently the foot is bandaged and was not reassessed  Musculoskeletal:  Previous amputation of the right first and second toes. Radiographic osteomyelitis in the third metatarsal.  Neurological:  Some loss of protective sensation distally in the toes.  Skin:  The skin is warm dry and somewhat atrophic. Chronic open wounds at the previous amputation sites at the first and second toes. Full-thickness wound down to exposed capsular and bone tissue along the medial aspect of the third metatarsophalangeal joint    Assessment/Plan: Assessment: Osteomyelitis right third metatarsal. Diabetes with peripheral vascular disease. Plan: We will plan for transmetatarsal amputation of the right foot tomorrow afternoon. Consent form for transmetatarsal amputation right foot. The patient will be nothing by mouth after midnight. Discussed the risks of this surgery including continued infection, nonhealing the wound due to her vascular disease, or the need for higher amputation. Questions were invited and answered. We will plan for surgery tomorrow afternoon  Lautaro Koral W. 05/27/2015, 5:27 PM

## 2015-05-28 NOTE — Progress Notes (Signed)
Called dr Raphael Gibney about blood sugar 69  Cup of orange juice given  Pt alert and coherent

## 2015-05-28 NOTE — Progress Notes (Signed)
Inpatient Diabetes Program Recommendations  AACE/ADA: New Consensus Statement on Inpatient Glycemic Control (2015)  Target Ranges:  Prepandial:   less than 140 mg/dL      Peak postprandial:   less than 180 mg/dL (1-2 hours)      Critically ill patients:  140 - 180 mg/dL  Results for [name], [name] (MRN [id number]) as of 05/28/2015 09:00  Ref. Range 05/27/2015 11:55 05/27/2015 16:22 05/27/2015 21:13 05/28/2015 07:39  Glucose-Capillary Latest Ref Range: 65-99 mg/dL 174 (H) 195 (H) 241 (H) 83   Review of Glycemic Control  Diabetes history: DM2 Outpatient Diabetes medications: Amaryl 1 mg QAM Current orders for Inpatient glycemic control: Amaryl 1 mg QAM, Novolog 0-9 units TID with meals  Inpatient Diabetes Program Recommendations: Correction (SSI): Please consider changing frequency of CBGs and Novolog correction to Q4H since patient is NPO. Oral Agents: Fasting glucose 83 mg/dl this morning. Please consider discontinuing Amaryl while inpatient, especially since the patient is NPO for surgery today.  Thanks, Barnie Alderman, RN, MSN, CDE Diabetes Coordinator Inpatient Diabetes Program [phone] (Team Pager from [place] to [place]) [phone] ([place] office) [phone] [facility] office) [phone] [facility] office)

## 2015-05-28 NOTE — Clinical Social Work Note (Signed)
Clinical Social Work Assessment  Patient Details  Name: [name] MRN: [id number] Date of Birth: 03/22/1946  Date of referral:  05/28/15               Reason for consult:  Facility Placement, Other (Comment Required) (Possible SNF VS. Home Health )                Permission sought to share information with:  Case Manager Permission granted to share information::  Yes, Verbal Permission Granted  Name::        Agency::     Relationship::     Contact Information:     Housing/Transportation Living arrangements for the past 2 months:  Single Family Home Source of Information:  Patient, Adult Children Patient Interpreter Needed:  None Criminal Activity/Legal Involvement Pertinent to Current Situation/Hospitalization:  No - Comment as needed Significant Relationships:  Adult Children, Spouse Lives with:  Spouse Do you feel safe going back to the place where you live?  Yes Need for family participation in patient care:  Yes (Comment)  Care giving concerns:  Patient lives in [place] with [name].    Social Worker assessment / plan:  Clinical Social Worker (CSW) received SNF consult. Per MD note patient is scheduled for transmetatarsal amputation today. PT is pending. CSW met with patient and her son [name]  [phone] was at bedside. Patient was asleep when CSW first entered the room however she was easily arousable. Patient was alert and oriented and laying in the bed. CSW introduced self and explained role of CSW department. Patient reported that she lives in [place]. CSW explained that PT will evaluate patient after surgery and make a recommendation of SNF or home health. Patient reported that she has been to [facility] for rehab in 2016 and does not want to go back there. Patient reported that she wants to return home with home health. Son also emphasized that patient does not want to go to SNF. CSW explained that there are other SNF's in the  area. Patient still prefers to go home. RN Case Manager aware of above. CSW will continue to follow and assist as needed.   Employment status:  Disabled (Comment on whether or not currently receiving Disability), Retired Insurance information:  Managed Medicare PT Recommendations:  Not assessed at this time Information / Referral to community resources:  Other (Comment Required) (Home Health VS. SNF )  Patient/Family's Response to care:  Patient prefers to return home with home health after surgery.   Patient/Family's Understanding of and Emotional Response to Diagnosis, Current Treatment, and Prognosis:  Patient and son were pleasant throughout assessment.   Emotional Assessment Appearance:  Appears stated age Attitude/Demeanor/Rapport:  Lethargic Affect (typically observed):  Pleasant Orientation:  Oriented to Self, Oriented to Place, Oriented to  Time, Oriented to Situation Alcohol / Substance use:  Not Applicable Psych involvement (Current and /or in the community):  No (Comment)  Discharge Needs  Concerns to be addressed:  Discharge Planning Concerns Readmission within the last 30 days:  No Current discharge risk:  Chronically ill Barriers to Discharge:  Continued Medical Work up   Morgan,  G, LCSW 05/28/2015, 9:13 AM  

## 2015-05-28 NOTE — NC FL2 (Signed)
[place] LEVEL OF CARE SCREENING TOOL     IDENTIFICATION  Patient Name: [name] Birthdate: 08-24-1945 Sex: female Admission Date (Current Location): 05/27/2015  [place] and [place] Number:  Engineering geologist and Address:  [facility], [address], [place], [place] [address]      Provider Number: [id number]  Attending Physician Name and Address:  Bettey Costa, MD  Relative Name and Phone Number:       Current Level of Care: Hospital Recommended Level of Care: [place] Prior Approval Number:    Date Approved/Denied:   PASRR Number:  ( [id number] )  Discharge Plan: SNF    Current Diagnoses: Patient Active Problem List   Diagnosis Date Noted  . Osteomyelitis ([place]) 05/27/2015  . 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 Osteomyelitis of ankle or foot ([place]) 04/02/2015  . Weight loss 02/21/2015  . Folate deficiency 02/21/2015  . Cellulitis of great toe of right foot 02/02/2015  . Diabetes mellitus ([place]) 02/02/2015  . CKD (chronic kidney disease), stage III 02/02/2015  . Anemia of chronic disease 02/02/2015  . Anemia 01/30/2015  . Infected wound ([place]) 01/29/2015  . Subdural hematoma ([place]) 12/06/2014  . Pressure ulcer 12/05/2014  . SDH (subdural hematoma) ([place]) 12/03/2014  . Chronic diastolic heart failure ([place]) 11/02/2014  . Melena 10/02/2014  . DM (diabetes mellitus) ([place]) 02/04/2010  . Deficiency anemia 02/04/2010  . Essential hypertension 02/04/2010  . Coronary atherosclerosis 02/04/2010    Orientation RESPIRATION BLADDER Height & Weight    Self, Time, Situation, Place  Normal Continent 5\' 7"  (170.2 cm) 143 lbs.  BEHAVIORAL SYMPTOMS/MOOD NEUROLOGICAL BOWEL NUTRITION STATUS   (none )  (none ) Continent Diet (NPO for surgery )  AMBULATORY STATUS COMMUNICATION OF NEEDS Skin   Extensive Assist Verbally Surgical wounds (transmetatarsal amputation )                       Personal Care  Assistance Level of Assistance  Bathing, Feeding, Dressing Bathing Assistance: Limited assistance Feeding assistance: Independent Dressing Assistance: Limited assistance     Functional Limitations Info  Sight, Hearing, Speech Sight Info: Adequate Hearing Info: Adequate Speech Info: Adequate    SPECIAL CARE FACTORS FREQUENCY  PT (By licensed PT), OT (By licensed OT)     PT Frequency:  (5) OT Frequency:  (5)            Contractures      Additional Factors Info  Code Status, Allergies, Isolation Precautions Code Status Info:  (Full Code. ) Allergies Info:  (Codeine)     Isolation Precautions Info:  (ESBL in Wound )     Current Medications (05/28/2015):  This is the current hospital active medication list Current Facility-Administered Medications  Medication Dose Route Frequency Provider Last Rate Last Dose  . 0.9 %  sodium chloride infusion   Intravenous Continuous Fritzi Mandes, MD 50 mL/hr at 05/27/15 1344    . acetaminophen (TYLENOL) tablet 650 mg  650 mg Oral Q6H PRN Fritzi Mandes, MD       Or  . acetaminophen (TYLENOL) suppository 650 mg  650 mg Rectal Q6H PRN Fritzi Mandes, MD      . acetaminophen (TYLENOL) tablet 650 mg  650 mg Oral Q4H PRN Fritzi Mandes, MD      . cloNIDine (CATAPRES) tablet 0.2 mg  0.2 mg Oral TID Fritzi Mandes, MD   0.2 mg at 05/27/15 2107  . docusate sodium (COLACE) capsule 100 mg  100 mg Oral BID Fritzi Mandes, MD   100 mg at 05/27/15 2108  . enoxaparin (LOVENOX) injection 40 mg  40 mg Subcutaneous Q24H Darylene Price [place], [facility]      . feeding supplement (GLUCERNA SHAKE) (GLUCERNA SHAKE) liquid 237 mL  237 mL Oral BID BM Fritzi Mandes, MD      . ferrous sulfate tablet 325 mg  325 mg Oral Daily Fritzi Mandes, MD      . folic acid (FOLVITE) tablet 1 mg  1 mg Oral Daily Fritzi Mandes, MD      . furosemide (LASIX) tablet 40 mg  40 mg Oral BID Fritzi Mandes, MD      . glimepiride (AMARYL) tablet 1 mg  1 mg Oral Daily Fritzi Mandes, MD      . hydrALAZINE (APRESOLINE) tablet 100 mg  100  mg Oral TID Fritzi Mandes, MD   100 mg at 05/27/15 2107  . insulin aspart (novoLOG) injection 0-9 Units  0-9 Units Subcutaneous TID WC Fritzi Mandes, MD   2 Units at 05/27/15 1654  . isosorbide dinitrate (ISORDIL) tablet 30 mg  30 mg Oral BID Fritzi Mandes, MD   30 mg at 05/27/15 2148  . labetalol (NORMODYNE) tablet 100 mg  100 mg Oral BID Fritzi Mandes, MD   100 mg at 05/27/15 2107  . mirtazapine (REMERON) tablet 15 mg  15 mg Oral QHS Fritzi Mandes, MD   15 mg at 05/27/15 2108  . nitroGLYCERIN (NITROSTAT) SL tablet 0.4 mg  0.4 mg Sublingual Q5 min PRN Fritzi Mandes, MD      . ondansetron (ZOFRAN) tablet 4 mg  4 mg Oral Q6H PRN Fritzi Mandes, MD       Or  . ondansetron (ZOFRAN) injection 4 mg  4 mg Intravenous Q6H PRN Fritzi Mandes, MD      . pantoprazole (PROTONIX) EC tablet 40 mg  40 mg Oral Daily Fritzi Mandes, MD      . piperacillin-tazobactam (ZOSYN) IVPB 3.375 g  3.375 g Intravenous Q8H Fritzi Mandes, MD   3.375 g at 05/28/15 0107  . polyethylene glycol (MIRALAX / GLYCOLAX) packet 17 g  17 g Oral Daily PRN Fritzi Mandes, MD      . potassium chloride SA (K-DUR,KLOR-CON) CR tablet 20 mEq  20 mEq Oral Daily Fritzi Mandes, MD      . propranolol (INDERAL) tablet 40 mg  40 mg Oral TID Fritzi Mandes, MD   40 mg at 05/27/15 2108  . ramipril (ALTACE) capsule 5 mg  5 mg Oral Daily Fritzi Mandes, MD      . senna (SENOKOT) tablet 8.6 mg  1 tablet Oral BID Fritzi Mandes, MD   8.6 mg at 05/27/15 2108  . simvastatin (ZOCOR) tablet 20 mg  20 mg Oral QHS Fritzi Mandes, MD   20 mg at 05/27/15 2108     Discharge Medications: Please see discharge summary for a list of discharge medications.  Relevant Imaging Results:  Relevant Lab Results:   Additional Information  (SSN: [id number])  [name], LCSW

## 2015-05-28 NOTE — Progress Notes (Signed)
PT Hold Note  Patient Details Name: HADIJA WAAG MRN: [id number] DOB: 09-Jul-1945   Cancelled Treatment:    Reason Eval/Treat Not Completed: Medical issues which prohibited therapy. Chart reviewed and RN consulted. Pt is scheduled for transmetatarsal amputation today. Will perform PT evaluation tomorrow pending new order after surgery with weight bearing status per podiatry.   Lyndel Safe Ahmiya Abee PT, DPT   Lafayette Dunlevy 05/28/2015, 8:50 AM

## 2015-05-28 NOTE — Op Note (Signed)
Date of operation: 05/28/2015.  Surgeon: Durward Fortes DPM.  Preoperative diagnoses: Osteomyelitis right forefoot.  Postoperative diagnosis: Same.  Procedure: Transmetatarsal amputation right forefoot.  Anesthesia: LMA with local.  Hemostasis: None.  Estimated blood loss: 50-75 cc.  Pathology: Right forefoot.  Drains: Wound VAC applied to the incision right foot.  Complications: None apparent.  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 She has had previous amputations of the first and second toes with partial first ray resection as well as revascularization to her right lower extremity. During the postoperative course she developed new osteomyelitis in her right third metatarsal and the decision was made for transmetatarsal amputation across the right forefoot.  Operative procedure: Patient was taken to the operating room and placed on the table in the supine position. Following satisfactory LMA anesthesia the right foot was prepped and draped in usual sterile fashion. Attention was then directed to the distal aspect of the right forefoot where a dorsal incision was made from the lateral aspect of the fifth metatarsal extending across the forefoot proximal to the toes and open ulcerations ending in the medial aspect of the foot along the first metatarsal. A corresponding plantar incision was made across the forefoot. Incision was carried sharply down to the bone. Using a large key elevator the soft tissues were elevated off of metatarsals 1 through 5. An additional segment of first metatarsal was resected using a power saw and then osteotomies performed through the second, third, fourth, and fifth metatarsals maintaining a relative parabola. The forefoot was then separated from the remaining soft tissues and disarticulated and removed in toto. Bleeders were identified and  cauterized. There was noted to be some purulent and devitalized tissue along the plantar flap around the third metatarsal area. This was debrided sharply using a versa jet debrider on a setting of 6. All of the remaining soft tissues were then debrided and irrigated with the versa jet on a setting of 2. Good healthy bleeding tissues were noted. The dorsal and plantar flaps were then closed using 3-0 Vicryl simple interrupted sutures for deep and superficial subcutaneous closure followed by skin closure using 3-0 nylon simple interrupted sutures. The foot was cleansed and dried and an incisional wound VAC was then applied to the incision. Following application of the VAC it was activated and there was noted be a good seal with no evidence of leak. Kerlix and an Ace wrap then applied over the back. The patient was awakened and transported to the PACU with vital signs stable tolerating the procedure and the anesthesias well.

## 2015-05-28 NOTE — Progress Notes (Signed)
Prime doc notified Dr. Posey Pronto . IV fluids D/C per patient's history of CHF.

## 2015-05-29 ENCOUNTER — Inpatient Hospital Stay: Payer: Commercial Managed Care - HMO

## 2015-05-29 LAB — GLUCOSE, CAPILLARY
GLUCOSE-CAPILLARY: 105 mg/dL — AB (ref 65–99)
GLUCOSE-CAPILLARY: 137 mg/dL — AB (ref 65–99)
GLUCOSE-CAPILLARY: 146 mg/dL — AB (ref 65–99)
Glucose-Capillary: 146 mg/dL — ABNORMAL HIGH (ref 65–99)
Glucose-Capillary: 132 mg/dL — ABNORMAL HIGH (ref 65–99)
Glucose-Capillary: 127 mg/dL — ABNORMAL HIGH (ref 65–99)

## 2015-05-29 LAB — BASIC METABOLIC PANEL
Anion gap: 5 (ref 5–15)
BUN: 36 mg/dL — ABNORMAL HIGH (ref 6–20)
CALCIUM: 7.1 mg/dL — AB (ref 8.9–10.3)
CO2: 18 mmol/L — ABNORMAL LOW (ref 22–32)
Chloride: 112 mmol/L — ABNORMAL HIGH (ref 101–111)
Creatinine, Ser: 1.8 mg/dL — ABNORMAL HIGH (ref 0.44–1.00)
GFR calc Af Amer: 32 mL/min — ABNORMAL LOW (ref >60)
GFR, EST NON AFRICAN AMERICAN: 27 mL/min — AB (ref >60)
Glucose, Bld: 124 mg/dL — ABNORMAL HIGH (ref 65–99)
Potassium: 3.7 mmol/L (ref 3.5–5.1)
SODIUM: 135 mmol/L (ref 135–145)

## 2015-05-29 LAB — HEMOGLOBIN AND HEMATOCRIT, BLOOD
HEMATOCRIT: 25.2 % — AB (ref 35.0–47.0)
HEMOGLOBIN: 8.1 g/dL — AB (ref 12.0–16.0)

## 2015-05-29 LAB — SEDIMENTATION RATE: Sed Rate: 37 mm/hr — ABNORMAL HIGH (ref 0–30)

## 2015-05-29 LAB — CBC
HCT: 23.2 % — ABNORMAL LOW (ref 35.0–47.0)
HEMOGLOBIN: 7.5 g/dL — AB (ref 12.0–16.0)
MCH: 28.5 pg (ref 26.0–34.0)
MCHC: 32.4 g/dL (ref 32.0–36.0)
MCV: 88.2 fL (ref 80.0–100.0)
Platelets: 155 10*3/uL (ref 150–440)
RBC: 2.63 MIL/uL — ABNORMAL LOW (ref 3.80–5.20)
RDW: 19.2 % — ABNORMAL HIGH (ref 11.5–14.5)
WBC: 5.3 10*3/uL (ref 3.6–11.0)

## 2015-05-29 LAB — PREPARE RBC (CROSSMATCH)

## 2015-05-29 MED ORDER — CALCIUM CARBONATE-VITAMIN D 500-200 MG-UNIT PO TABS
1.0000 | ORAL_TABLET | Freq: Two times a day (BID) | ORAL | Status: DC
  Administered 2015-05-29 – 2015-05-30 (×2): 1 via ORAL
  Filled 2015-05-29 (×3): qty 1

## 2015-05-29 MED ORDER — INSULIN ASPART 100 UNIT/ML ~~LOC~~ SOLN: 0.0000 [IU] | Freq: Every day | SUBCUTANEOUS | Status: DC

## 2015-05-29 MED ORDER — INSULIN ASPART 100 UNIT/ML ~~LOC~~ SOLN
0.0000 [IU] | Freq: Three times a day (TID) | SUBCUTANEOUS | Status: DC
Start: 2015-05-29 — End: 2015-05-29

## 2015-05-29 MED ORDER — SODIUM CHLORIDE 0.9 % IV SOLN
Freq: Once | INTRAVENOUS | Status: AC
  Administered 2015-05-29: 14:00:00 via INTRAVENOUS

## 2015-05-29 MED ORDER — OXYCODONE-ACETAMINOPHEN 5-325 MG PO TABS: 1.0000 | ORAL_TABLET | ORAL | Status: DC | PRN

## 2015-05-29 MED ORDER — SULFAMETHOXAZOLE-TRIMETHOPRIM 800-160 MG PO TABS
1.0000 | ORAL_TABLET | Freq: Two times a day (BID) | ORAL | Status: DC
  Administered 2015-05-29 – 2015-05-30 (×2): 1 via ORAL
  Filled 2015-05-29 (×5): qty 1

## 2015-05-29 MED ORDER — ENOXAPARIN SODIUM 30 MG/0.3ML ~~LOC~~ SOLN
30.0000 mg | SUBCUTANEOUS | Status: DC
  Administered 2015-05-29: 30 mg via SUBCUTANEOUS
  Filled 2015-05-29: qty 0.3

## 2015-05-29 MED ORDER — SODIUM CHLORIDE 0.9% FLUSH
10.0000 mL | Freq: Two times a day (BID) | INTRAVENOUS | Status: DC
Start: 2015-05-29 — End: 2015-05-30
  Administered 2015-05-29 – 2015-05-30 (×2): 10 mL via INTRAVENOUS

## 2015-05-29 NOTE — Care Management Important Message (Signed)
Important Message  Patient Details  Name: [name] MRN: [id number] Date of Birth: 1945-05-15   Medicare Important Message Given:  Yes    Shelbie Ammons, RN 05/29/2015, 11:24 AM

## 2015-05-29 NOTE — Progress Notes (Signed)
1 Day Post-Op  Subjective: Patient seen. States that her right foot hurts. Pain medications are helping a little.  Objective: Vital signs in last 24 hours: Temp:  [96.8 F (36 C)-101 F (38.3 C)] 98.7 F (37.1 C) (01/25 0800) Pulse Rate:  [59-109] 78 (01/25 0800) Resp:  [12-18] 16 (01/25 0800) BP: (108-173)/(48-64) 152/60 mmHg (01/25 0800) SpO2:  [92 %-100 %] 96 % (01/25 0800) Weight:  [64.864 kg (143 lb)] 64.864 kg (143 lb) (01/24 1137) Last BM Date: 05/27/15  Intake/Output from previous day: 01/24 0701 - 01/25 0700 In: 950 [P.O.:100; I.V.:800; IV Piggyback:50] Out: 100 [Blood:100] Intake/Output this shift:    The wound VAC on the surgical site at the right foot is intact with a good seal. Minimal drainage is noted in the tubing.  Lab Results:   Recent Labs  05/27/15 1156 05/29/15 0642  WBC 4.1 5.3  HGB 8.4* 7.5*  HCT 26.1* 23.2*  PLT 160 155   BMET  Recent Labs  05/27/15 1156 05/28/15 0601 05/29/15 0642  NA 135  --  135  K 3.8  --  3.7  CL 109  --  112*  CO2 18*  --  18*  GLUCOSE 199*  --  124*  BUN 39*  --  36*  CREATININE 1.71* 1.55* 1.80*  CALCIUM 7.6*  --  7.1*   PT/INR  Recent Labs  05/27/15 1156  LABPROT 15.2*  INR 1.18   ABG No results for input(s): PHART, HCO3 in the last 72 hours.  Invalid input(s): PCO2, PO2  Studies/Results: No results found.  Anti-infectives: Anti-infectives    Start     Dose/Rate Route Frequency Ordered Stop   05/28/15 1145  ceFAZolin (ANCEF) IVPB 2 g/50 mL premix     2 g 100 mL/hr over 30 Minutes Intravenous  Once 05/28/15 1144 05/28/15 1233   05/27/15 1800  piperacillin-tazobactam (ZOSYN) IVPB 3.375 g     3.375 g 12.5 mL/hr over 240 Minutes Intravenous Every 8 hours 05/27/15 1640     05/27/15 1200  clindamycin (CLEOCIN) IVPB 600 mg  Status:  Discontinued     600 mg 100 mL/hr over 30 Minutes Intravenous 3 times per day 05/27/15 1134 05/27/15 1624      Assessment/Plan: s/p  Procedure(s): TRANSMETATARSAL AMPUTATION (Right) Plan: At this point we will leave the wound VAC intact. We will plan for follow-up next Tuesday for removal of the back and reevaluation of the wound. She will need to continue on oral antibiotics for a couple of weeks until wound closure. We will get her prescription for pain medication. From a foot standpoint she should be stable for discharge later today.  LOS: 2 days    Krystal Bartles W. 05/29/2015

## 2015-05-29 NOTE — Progress Notes (Signed)
[place] for PICC placement, pending call back with time.  Consent obtained verbally from patient and spouse over the phone since patient is occasionally confused.

## 2015-05-29 NOTE — Progress Notes (Signed)
Double PICC line placed by [employer].  Xray confirmed placement and need for 5cm pull back.  Completed and approved for [place] by [employer].

## 2015-05-29 NOTE — Progress Notes (Addendum)
Infectious Disease Long Term IV Antibiotic Orders  Diagnosis: Gangrene R foot, s.p TMA 1/24  Culture results Results for orders placed or performed during the hospital encounter of 05/27/15  Surgical pcr screen     Status: None   Collection Time: 05/27/15  3:41 PM  Result Value Ref Range Status   MRSA, PCR NEGATIVE NEGATIVE Final   Staphylococcus aureus NEGATIVE NEGATIVE Final    Comment:        The Xpert SA Assay (FDA approved for NASAL specimens in patients over 25 years of age), is one component of a comprehensive surveillance program.  Test performance has been validated by [facility] for patients greater than or equal to 65 year old. It is not intended to diagnose infection nor to guide or monitor treatment.      Allergies:  Allergies  Allergen Reactions  . Codeine Other (See Comments)    Reaction:  Hallucinations    Discharge antibiotics Zosyn 3.375 grams every 8 Hours  Bactrim DS bid oral  PICC Care per protocol Labs weekly while on IV antibiotics      CBC w diff   Comprehensive met panel CRP   Planned duration of antibiotics 2 weeks   Stop date 06/12/15  Follow up clinic date TBD  FAX weekly labs to [phone]  Leonel Ramsay, MD

## 2015-05-29 NOTE — Consult Note (Signed)
[facility] Infectious Disease     Reason for Consult: Gangrene     Referring Physician: Bettey Costa Date of Admission:  05/27/2015   Active Problems:   Osteomyelitis ([place])  HPI: [name] is a 70 y.o. female with DM, PVD with recent admisision for gangrene of R 1st, 2nd toes s/p partial amputation and revascularization last week.   She failed management with IV and oral abx and was readmitted and had TMA done 1/24 by Dr Cleda Mccreedy. She is currently getting PRBC.  She had fever last pm.   Past Medical History  Diagnosis Date  . Hypertension   . Diabetes mellitus without complication ([place])   . Anemia   . Coronary artery disease   . CHF (congestive heart failure) ([place])   . Glaucoma   . Hyperlipidemia   . Neuropathy ([place])   . Chronic anemia   . Stroke ([place])   . Anginal pain ([place])   . Arthritis   . Retinopathy   . Cardiomyopathy ([place])   . Carotid artery stenosis   . Hoarseness, chronic   . GERD (gastroesophageal reflux disease)   . Hemangioma of liver   . Weight loss, unintentional   . DDD (degenerative disc disease), lumbar   . Osteomyelitis of foot, right, acute ([place])   . Chronic kidney disease     Stage 3   Past Surgical History  Procedure Laterality Date  . Cardiac catheterization    . Esophagogastroduodenoscopy (egd) with propofol N/A 09/24/2014    Elliott-gastritis & normal Billroth II changes   . Colonoscopy with propofol N/A 09/24/2014    Elliott-Incomplete secondary to prep  . Coronary angioplasty  2016    stent placed  . Bilroth ii procedure    . Colonoscopy with propofol N/A 11/12/2014    Procedure: COLONOSCOPY WITH PROPOFOL;  Surgeon: Manya Silvas, MD;  Location[facility] ENDOSCOPY;  Service: Endoscopy;  Laterality: N/A;  Trudee Kuster hole Right 12/06/2014    Procedure: Right burr hole  for subdural hematoma;  Surgeon: Newman Pies, MD;  Location: [facility] NEURO ORS;  Service: Neurosurgery;  Laterality: Right;  . Irrigation and debridement foot Right 02/01/2015     Procedure: IRRIGATION AND DEBRIDEMENT FOOT/RIGHT GREAT TOE;  Surgeon: [name], DPM;  Location: [facility] ORS;  Service: Podiatry;  Laterality: Right;  . Eye surgery Right     cataract removed  . Amputation toe Right 03/01/2015    Procedure: AMPUTATION  RIGHT GREAT TOE;  Surgeon: [name], DPM;  Location: [facility] ORS;  Service: Podiatry;  Laterality: Right;  . Irrigation and debridement foot Right 04/03/2015    Procedure: IRRIGATION AND DEBRIDEMENT FOOT;  Surgeon: Sharlotte Alamo, MD;  Location: [facility] ORS;  Service: Podiatry;  Laterality: Right;  . Peripheral vascular catheterization N/A 04/04/2015    Procedure: Abdominal Aortogram w/Lower Extremity;  Surgeon: Algernon Huxley, MD;  Location: [place] CV LAB;  Service: Cardiovascular;  Laterality: N/A;  . Peripheral vascular catheterization  04/04/2015    Procedure: Lower Extremity Intervention;  Surgeon: Algernon Huxley, MD;  Location: [place] CV LAB;  Service: Cardiovascular;;  . Brain surgery    . Transmetatarsal amputation Right 05/28/2015    Procedure: TRANSMETATARSAL AMPUTATION;  Surgeon: Sharlotte Alamo, DPM;  Location: [facility] ORS;  Service: Podiatry;  Laterality: Right;   Social History  Substance Use Topics  . Smoking status: Never Smoker   . Smokeless tobacco: Never Used     Comment: quit many years ago  . Alcohol Use: No   Family  History  Problem Relation Age of Onset  . Diabetes Mother   . Heart disease Father   . Diabetes Sister   . Lung cancer Brother   . Cancer Brother   . Diabetes Brother   . Diabetes Sister     Allergies:  Allergies  Allergen Reactions  . Codeine Other (See Comments)    Reaction:  Hallucinations    Current antibiotics: Antibiotics Given (last 72 hours)    Date/Time Action Medication Dose Rate   05/27/15 1344 Given   clindamycin (CLEOCIN) IVPB 600 mg 600 mg 100 mL/hr   05/27/15 1701 Given   piperacillin-tazobactam (ZOSYN) IVPB 3.375 g 3.375 g 12.5 mL/hr   05/28/15 0107 Given    piperacillin-tazobactam (ZOSYN) IVPB 3.375 g 3.375 g 12.5 mL/hr   05/28/15 1002 Given   piperacillin-tazobactam (ZOSYN) IVPB 3.375 g 3.375 g 12.5 mL/hr   05/28/15 1233 Given   ceFAZolin (ANCEF) IVPB 2 g/50 mL premix 2 g    05/28/15 1832 Given   piperacillin-tazobactam (ZOSYN) IVPB 3.375 g 3.375 g 12.5 mL/hr   05/29/15 0134 Given   piperacillin-tazobactam (ZOSYN) IVPB 3.375 g 3.375 g 12.5 mL/hr      MEDICATIONS: . calcium-vitamin D  1 tablet Oral BID  . cloNIDine  0.2 mg Oral TID  . docusate sodium  100 mg Oral BID  . enoxaparin (LOVENOX) injection  30 mg Subcutaneous Q24H  . feeding supplement (GLUCERNA SHAKE)  237 mL Oral BID BM  . ferrous sulfate  325 mg Oral Daily  . folic acid  1 mg Oral Daily  . furosemide  40 mg Oral BID  . hydrALAZINE  100 mg Oral TID  . insulin aspart  0-5 Units Subcutaneous QHS  . insulin aspart  0-9 Units Subcutaneous TID WC  . isosorbide dinitrate  30 mg Oral BID  . labetalol  100 mg Oral BID  . mirtazapine  15 mg Oral QHS  . pantoprazole  40 mg Oral Daily  . piperacillin-tazobactam (ZOSYN)  IV  3.375 g Intravenous Q8H  . potassium chloride SA  20 mEq Oral Daily  . propranolol  40 mg Oral TID  . ramipril  5 mg Oral Daily  . senna  1 tablet Oral BID  . simvastatin  20 mg Oral QHS  . sodium chloride flush  10 mL Intravenous Q12H  . sodium chloride flush  3 mL Intravenous Q12H    Review of Systems - 11 systems reviewed and negative per HPI   OBJECTIVE: Temp:  [97.1 F (36.2 C)-101 F (38.3 C)] 98.1 F (36.7 C) (01/25 1419) Pulse Rate:  [59-109] 68 (01/25 1419) Resp:  [14-18] 16 (01/25 1419) BP: (108-156)/(43-60) 116/43 mmHg (01/25 1419) SpO2:  [92 %-98 %] 98 % (01/25 1419) Physical Exam  Constitutional:  oriented to person, place, and time.frail HENT: [place]/AT, PERRLA, no scleral icterus Mouth/Throat: Oropharynx is clear and moist. No oropharyngeal exudate.  Cardiovascular: Normal rate, regular rhythm Pulmonary/Chest: Effort normal and  breath sounds normal. No respiratory distress.  has no wheezes.  Neck  supple, no nuchal rigidity Abdominal: Soft. Bowel sounds are normal.  exhibits no distension. There is no tenderness.  Lymphadenopathy: no cervical adenopathy. No axillary adenopathy Neurological: alert and oriented to person, place, and time.  Ext R foot wrapped post TMA Psychiatric: a normal mood and affect.  behavior is normal.    LABS: Results for orders placed or performed during the hospital encounter of 05/27/15 (from the past 48 hour(s))  Surgical pcr screen  Status: None   Collection Time: 05/27/15  3:41 PM  Result Value Ref Range   MRSA, PCR NEGATIVE NEGATIVE   Staphylococcus aureus NEGATIVE NEGATIVE    Comment:        The Xpert SA Assay (FDA approved for NASAL specimens in patients over 27 years of age), is one component of a comprehensive surveillance program.  Test performance has been validated by [facility] for patients greater than or equal to 38 year old. It is not intended to diagnose infection nor to guide or monitor treatment.   Glucose, capillary     Status: Abnormal   Collection Time: 05/27/15  4:22 PM  Result Value Ref Range   Glucose-Capillary 195 (H) 65 - 99 mg/dL  Glucose, capillary     Status: Abnormal   Collection Time: 05/27/15  9:13 PM  Result Value Ref Range   Glucose-Capillary 241 (H) 65 - 99 mg/dL   Comment 1 Notify RN   Creatinine, serum     Status: Abnormal   Collection Time: 05/28/15  6:01 AM  Result Value Ref Range   Creatinine, Ser 1.55 (H) 0.44 - 1.00 mg/dL   GFR calc non Af Amer 33 (L) >60 mL/min   GFR calc Af Amer 38 (L) >60 mL/min    Comment: (NOTE) The eGFR has been calculated using the CKD EPI equation. This calculation has not been validated in all clinical situations. eGFR's persistently <60 mL/min signify possible Chronic Kidney Disease.   Glucose, capillary     Status: None   Collection Time: 05/28/15  7:39 AM  Result Value Ref Range    Glucose-Capillary 83 65 - 99 mg/dL   Comment 1 Notify RN   Glucose, capillary     Status: None   Collection Time: 05/28/15 10:58 AM  Result Value Ref Range   Glucose-Capillary 78 65 - 99 mg/dL   Comment 1 Notify RN   Glucose, capillary     Status: None   Collection Time: 05/28/15  1:48 PM  Result Value Ref Range   Glucose-Capillary 69 65 - 99 mg/dL  Glucose, capillary     Status: Abnormal   Collection Time: 05/28/15  3:55 PM  Result Value Ref Range   Glucose-Capillary 115 (H) 65 - 99 mg/dL  Glucose, capillary     Status: Abnormal   Collection Time: 05/28/15  8:10 PM  Result Value Ref Range   Glucose-Capillary 165 (H) 65 - 99 mg/dL   Comment 1 Notify RN   Glucose, capillary     Status: Abnormal   Collection Time: 05/29/15 12:36 AM  Result Value Ref Range   Glucose-Capillary 146 (H) 65 - 99 mg/dL   Comment 1 Notify RN   Glucose, capillary     Status: Abnormal   Collection Time: 05/29/15  4:01 AM  Result Value Ref Range   Glucose-Capillary 137 (H) 65 - 99 mg/dL   Comment 1 Notify RN   CBC     Status: Abnormal   Collection Time: 05/29/15  6:42 AM  Result Value Ref Range   WBC 5.3 3.6 - 11.0 K/uL   RBC 2.63 (L) 3.80 - 5.20 MIL/uL   Hemoglobin 7.5 (L) 12.0 - 16.0 g/dL   HCT 23.2 (L) 35.0 - 47.0 %   MCV 88.2 80.0 - 100.0 fL   MCH 28.5 26.0 - 34.0 pg   MCHC 32.4 32.0 - 36.0 g/dL   RDW 19.2 (H) 11.5 - 14.5 %   Platelets 155 150 - 440 K/uL  Basic metabolic  panel     Status: Abnormal   Collection Time: 05/29/15  6:42 AM  Result Value Ref Range   Sodium 135 135 - 145 mmol/L   Potassium 3.7 3.5 - 5.1 mmol/L   Chloride 112 (H) 101 - 111 mmol/L   CO2 18 (L) 22 - 32 mmol/L   Glucose, Bld 124 (H) 65 - 99 mg/dL   BUN 36 (H) 6 - 20 mg/dL   Creatinine, Ser 1.80 (H) 0.44 - 1.00 mg/dL   Calcium 7.1 (L) 8.9 - 10.3 mg/dL   GFR calc non Af Amer 27 (L) >60 mL/min   GFR calc Af Amer 32 (L) >60 mL/min    Comment: (NOTE) The eGFR has been calculated using the CKD EPI equation. This  calculation has not been validated in all clinical situations. eGFR's persistently <60 mL/min signify possible Chronic Kidney Disease.    Anion gap 5 5 - 15  Glucose, capillary     Status: Abnormal   Collection Time: 05/29/15  7:58 AM  Result Value Ref Range   Glucose-Capillary 105 (H) 65 - 99 mg/dL   Comment 1 Notify RN   Type and screen [place]     Status: None (Preliminary result)   Collection Time: 05/29/15  9:28 AM  Result Value Ref Range   ABO/RH(D) O POS    Antibody Screen NEG    Sample Expiration 06/01/2015    Unit Number [id number]    Blood Component Type RED CELLS,LR    Unit division 00    Status of Unit ISSUED    Transfusion Status OK TO TRANSFUSE    Crossmatch Result Compatible   Prepare RBC     Status: None   Collection Time: 05/29/15  9:29 AM  Result Value Ref Range   Order Confirmation ORDER PROCESSED BY BLOOD BANK   Glucose, capillary     Status: Abnormal   Collection Time: 05/29/15 11:17 AM  Result Value Ref Range   Glucose-Capillary 146 (H) 65 - 99 mg/dL   Comment 1 Notify RN    No components found for: ESR, C REACTIVE PROTEIN MICRO: Recent Results (from the past 720 hour(s))  Culture, group A strep [facility])     Status: None   Collection Time: 05/02/15  4:10 PM  Result Value Ref Range Status   Specimen Description THROAT  Final   Special Requests NONE  Final   Culture NO BETA HEMOLYTIC STREPTOCOCCI ISOLATED  Final   Report Status 05/05/2015 FINAL  Final  Wound culture     Status: None   Collection Time: 05/20/15 10:15 AM  Result Value Ref Range Status   Specimen Description FOOT  Final   Special Requests NONE  Final   Gram Stain   Final    FEW WBC SEEN FEW GRAM NEGATIVE RODS RARE GRAM POSITIVE COCCI IN PAIRS    Culture   Final    LIGHT GROWTH ENTEROBACTER CLOACAE MODERATE GROWTH STENOTROPHOMONAS MALTOPHILIA LIGHT GROWTH ENTEROCOCCUS FAECIUM    Report Status 05/23/2015 FINAL  Final   Organism ID, Bacteria  STENOTROPHOMONAS MALTOPHILIA  Final   Organism ID, Bacteria ENTEROCOCCUS FAECIUM  Final   Organism ID, Bacteria ENTEROBACTER CLOACAE  Final      Susceptibility   Enterobacter cloacae - MIC*    PIP/TAZO Value in next row Sensitive      SENSITIVE<=4    CEFTAZIDIME Value in next row Sensitive      SENSITIVE<=1    CEFTRIAXONE Value in next row Sensitive  SENSITIVE<=1    IMIPENEM Value in next row Sensitive      SENSITIVE<=0.25    GENTAMICIN Value in next row Sensitive      SENSITIVE<=1    CIPROFLOXACIN Value in next row Sensitive      SENSITIVE<=0.25    NITROFURANTOIN Value in next row Sensitive      SENSITIVE32    TRIMETH/SULFA Value in next row Sensitive      SENSITIVE<=20    * LIGHT GROWTH ENTEROBACTER CLOACAE   Stenotrophomonas maltophilia - MIC*    LEVOFLOXACIN Value in next row Resistant      SENSITIVE<=20    TRIMETH/SULFA Value in next row Sensitive      SENSITIVE<=20    * MODERATE GROWTH STENOTROPHOMONAS MALTOPHILIA   Enterococcus faecium - MIC*    AMPICILLIN Value in next row Resistant      SENSITIVE<=20    GENTAMICIN SYNERGY Value in next row Sensitive      SENSITIVE<=20    * LIGHT GROWTH ENTEROCOCCUS FAECIUM  Anaerobic culture     Status: None   Collection Time: 05/20/15 10:15 AM  Result Value Ref Range Status   Specimen Description FOOT  Final   Special Requests NONE  Final   Culture NO ANAEROBES ISOLATED  Final   Report Status 05/24/2015 FINAL  Final  Surgical pcr screen     Status: None   Collection Time: 05/23/15 11:57 AM  Result Value Ref Range Status   MRSA, PCR NEGATIVE NEGATIVE Final   Staphylococcus aureus NEGATIVE NEGATIVE Final    Comment:        The Xpert SA Assay (FDA approved for NASAL specimens in patients over 44 years of age), is one component of a comprehensive surveillance program.  Test performance has been validated by [facility] for patients greater than or equal to 64 year old. It is not intended to diagnose infection nor  to guide or monitor treatment.   Surgical pcr screen     Status: None   Collection Time: 05/27/15  3:41 PM  Result Value Ref Range Status   MRSA, PCR NEGATIVE NEGATIVE Final   Staphylococcus aureus NEGATIVE NEGATIVE Final    Comment:        The Xpert SA Assay (FDA approved for NASAL specimens in patients over 44 years of age), is one component of a comprehensive surveillance program.  Test performance has been validated by [facility] for patients greater than or equal to 62 year old. It is not intended to diagnose infection nor to guide or monitor treatment.    IMAGING: Dg Chest Port 1 View  05/29/2015  CLINICAL DATA:  PICC line placement. Recently postop for metatarsal amputation. EXAM: PORTABLE CHEST 1 VIEW COMPARISON:  04/05/2015 FINDINGS: A new right arm PICC line is seen with tip overlying the inferior aspect of the right atrium, approximately 5 cm below the superior cavoatrial junction. No pneumothorax visualized Decreased left pleural effusion and left basilar atelectasis is demonstrated. Increased atelectasis seen in the right lung base. Heart size remains stable IMPRESSION: New right arm PICC line tip overlies the inferior aspect of the right atrium, approximately 5 cm below the superior cavoatrial junction. Decreased left pleural effusion and left lower lobe atelectasis. Increased Right basilar atelectasis. Electronically Signed   By: Earle Gell M.D.   On: 05/29/2015 12:49    Assessment:   [name] is a 70 y.o. female with DM, PVD, s/p revascularization with prior amputation of R great toe 93/71 with a complicated course  including infection with multiple organisms ( including Stentotrophomonas, ESBL E coli, Providencie, morganell, enterococcus.) She is now s/p TMA 1/24. Recent cx with Stento, enterococcus, and enterbacter.   Recommendations Would suggest 2 week IV course of zosyn to cover isolated pathogens.  Will need oral bactrim for the stentotrophomonas  See  abx order sheet At 2 week mark will reassess and consider change to oral therapy until healed Check esr crp Thank you very much for allowing me to participate in the care of this patient. Please call with questions.   Cheral Marker. Ola Spurr, MD

## 2015-05-29 NOTE — Progress Notes (Signed)
Attempted to administer scheduled  medication patient to lethargic. VSS. CBG WNL

## 2015-05-29 NOTE — Progress Notes (Signed)
Dr. Jannifer Franklin notified no new orders giving. Will come to assess patient.

## 2015-05-29 NOTE — Progress Notes (Addendum)
Patient lethargic and restless. Patient alert x2. Patient anxious and agtiated. Prime Doc paged of change of patients baseline.

## 2015-05-29 NOTE — Progress Notes (Signed)
[name] at [facility] NAME: [name]    MR#:  [id number]  DATE OF BIRTH:  June 24, 1945  SUBJECTIVE:    Patient is postoperative day #1 for transmetatarsal amputation. Patient had a fever earlier this morning. Patient has no complaints and is denying pain.  REVIEW OF SYSTEMS:    Review of Systems  Constitutional: Positive for fever. Negative for chills and malaise/fatigue.  HENT: Negative for sore throat.   Eyes: Negative for blurred vision.  Respiratory: Negative for cough, hemoptysis, shortness of breath and wheezing.   Cardiovascular: Negative for chest pain, palpitations and leg swelling.  Gastrointestinal: Negative for nausea, vomiting, abdominal pain, diarrhea and blood in stool.  Genitourinary: Negative for dysuria.  Musculoskeletal: Negative for back pain.  Neurological: Negative for dizziness, tremors and headaches.  Endo/Heme/Allergies: Does not bruise/bleed easily.    Tolerating Diet: yes     DRUG ALLERGIES:   Allergies  Allergen Reactions  . Codeine Other (See Comments)    Reaction:  Hallucinations    VITALS:  Blood pressure 152/60, pulse 78, temperature 98.7 F (37.1 C), temperature source Oral, resp. rate 16, height 5\' 7"  (1.702 m), weight 64.864 kg (143 lb), SpO2 96 %.  PHYSICAL EXAMINATION:   Physical Exam  Constitutional: She is oriented to person, place, and time and well-developed, well-nourished, and in no distress. No distress.  HENT:  Head: Normocephalic.  Eyes: No scleral icterus.  Neck: Normal range of motion. Neck supple. No JVD present. No tracheal deviation present.  Cardiovascular: Normal rate, regular rhythm and normal heart sounds.  Exam reveals no gallop and no friction rub.   No murmur heard. Pulmonary/Chest: Effort normal and breath sounds normal. No respiratory distress. She has no wheezes. She has no rales. She exhibits no tenderness.  Abdominal: Soft. Bowel sounds are normal.  She exhibits no distension and no mass. There is no tenderness. There is no rebound and no guarding.  Musculoskeletal: Normal range of motion. She exhibits no edema.  Neurological: She is alert and oriented to person, place, and time.  Skin: Skin is warm. No erythema.  Foot is wrapped wound vac placed  Psychiatric: Affect and judgment normal.      LABORATORY PANEL:   CBC  Recent Labs Lab 05/29/15 0642  WBC 5.3  HGB 7.5*  HCT 23.2*  PLT 155   ------------------------------------------------------------------------------------------------------------------  Chemistries   Recent Labs Lab 05/29/15 0642  NA 135  K 3.7  CL 112*  CO2 18*  GLUCOSE 124*  BUN 36*  CREATININE 1.80*  CALCIUM 7.1*   ------------------------------------------------------------------------------------------------------------------  Cardiac Enzymes No results for input(s): TROPONINI in the last 168 hours. ------------------------------------------------------------------------------------------------------------------  RADIOLOGY:  No results found.   ASSESSMENT AND PLAN:   70 year old female with a history of diabetes, CAD and peripheral vascular disease who presents with osteomyelitis in the third metatarsal phalangeal joint.   1. Osteomyelitis of the right third metatarsal in a patient with diabetes and peripheral vascular disease: Patient is postoperative day #1 for transmetatarsal amputation and now has a wound VAC. Appreciate podiatry consultation. Continue Zosyn for now. I have spoken with Dr. Ola Spurr and he is recommending PICC line placement. He will further assistance with antibiotics and duration. Patient had a fever this morning and we may need to broaden spectrum of antibiotic. A wound culture was not obtained after amputation. Previous cultures show Enterobacter CLOACAE and STENOTROPHOMONAS MALTOPHILIA and LIGHT GROWTH ENTEROCOCCUS FAECIUM  Continue wound VAC. Podiatry will  follow up next Tuesday  for removal of the back and reevaluation of the wound.  2. Diabetes: As per diabetes coordinator consultation I have discontinued Amaryl, continue siding scale insulin and and change POCT to every 4 hours.  3. Essential hypertension: Continue clonidine, hydralazine, isosorbide, ramipril, propanolol and labetalol.  4. Hyperlipidemia: Continue simvastatin  5. History of CAD: Continue isosorbide, simvastatin and propanolol.  6. Acute blood loss anemia on Anemia of chronic disease and iron deficiency: Continue ferrous sulfate and folic acid. Hemoglobin is down to 7.5 after surgery. Patient has consented for PRBC.  7. Chronic kidney disease stage III: Patient's creatinine remains stable. Continue to monitor. Hold nephrotoxic agents.  Management plans discussed with the patient and she  is in agreement.  CODE STATUS: FULL  TOTAL TIME TAKING CARE OF THIS PATIENT: 29 minutes.  Discussed with Dr. Ola Spurr and Dr. Cleda Mccreedy   POSSIBLE D/C 3-4 days, DEPENDING ON CLINICAL CONDITION.   Ellias Mcelreath M.D on 05/29/2015 at 10:38 AM  Between 7am to 6pm - Pager - [phone] After 6pm go to [email or web address] - password [email or web address] [facility]  Office  [phone]  CC: Primary care physician; [facility]  Note: This dictation was prepared with Dragon dictation along with smaller phrase technology. Any transcriptional errors that result from this process are unintentional.

## 2015-05-30 LAB — GLUCOSE, CAPILLARY
GLUCOSE-CAPILLARY: 81 mg/dL (ref 65–99)
GLUCOSE-CAPILLARY: 115 mg/dL — AB (ref 65–99)
Glucose-Capillary: 83 mg/dL (ref 65–99)

## 2015-05-30 LAB — BASIC METABOLIC PANEL
Anion gap: 5 (ref 5–15)
BUN: 41 mg/dL — AB (ref 6–20)
CO2: 19 mmol/L — ABNORMAL LOW (ref 22–32)
CREATININE: 2.08 mg/dL — AB (ref 0.44–1.00)
Calcium: 7.3 mg/dL — ABNORMAL LOW (ref 8.9–10.3)
Chloride: 116 mmol/L — ABNORMAL HIGH (ref 101–111)
GFR calc Af Amer: 27 mL/min — ABNORMAL LOW (ref >60)
GFR, EST NON AFRICAN AMERICAN: 23 mL/min — AB (ref >60)
Glucose, Bld: 86 mg/dL (ref 65–99)
Potassium: 4.1 mmol/L (ref 3.5–5.1)
SODIUM: 140 mmol/L (ref 135–145)

## 2015-05-30 LAB — TYPE AND SCREEN
ABO/RH(D): O POS
Antibody Screen: NEGATIVE
UNIT DIVISION: 0

## 2015-05-30 LAB — C-REACTIVE PROTEIN: CRP: 15.5 mg/dL — AB (ref <1.0)

## 2015-05-30 LAB — CBC
HCT: 24.7 % — ABNORMAL LOW (ref 35.0–47.0)
Hemoglobin: 7.8 g/dL — ABNORMAL LOW (ref 12.0–16.0)
MCH: 28.3 pg (ref 26.0–34.0)
MCHC: 31.7 g/dL — AB (ref 32.0–36.0)
MCV: 89.3 fL (ref 80.0–100.0)
PLATELETS: 139 10*3/uL — AB (ref 150–440)
RBC: 2.77 MIL/uL — ABNORMAL LOW (ref 3.80–5.20)
RDW: 18 % — AB (ref 11.5–14.5)
WBC: 6.5 10*3/uL (ref 3.6–11.0)

## 2015-05-30 LAB — SURGICAL PATHOLOGY

## 2015-05-30 MED ORDER — PIPERACILLIN-TAZOBACTAM 3.375 G IVPB: 3.3750 g | Freq: Three times a day (TID) | INTRAVENOUS | Status: AC

## 2015-05-30 MED ORDER — CALCIUM CARBONATE-VITAMIN D 500-200 MG-UNIT PO TABS: 1.0000 | ORAL_TABLET | Freq: Two times a day (BID) | ORAL | Status: DC

## 2015-05-30 MED ORDER — SULFAMETHOXAZOLE-TRIMETHOPRIM 800-160 MG PO TABS: 1.0000 | ORAL_TABLET | Freq: Two times a day (BID) | ORAL | Status: DC

## 2015-05-30 MED ORDER — TRAMADOL HCL 50 MG PO TABS: 50.0000 mg | ORAL_TABLET | Freq: Three times a day (TID) | ORAL | Status: DC | PRN

## 2015-05-30 MED ORDER — INSULIN ASPART 100 UNIT/ML ~~LOC~~ SOLN: 0.0000 [IU] | Freq: Three times a day (TID) | SUBCUTANEOUS | Status: AC

## 2015-05-30 NOTE — Progress Notes (Signed)
DISCHARGE NOTE:  Report called to [employer] at [employer]. PICC remains in place. EMS called. Pt waiting on transportation.

## 2015-05-30 NOTE — Progress Notes (Signed)
Dr. Jannifer Franklin assessed patient no new orders giving.

## 2015-05-30 NOTE — Evaluation (Signed)
Physical Therapy Evaluation Patient Details Name: [name] MRN: [id number] DOB: Jun 19, 1945 Today's Date: 05/30/2015   History of Present Illness  presented to ER from podiatry office for persistent R foot infection, foul discharge; admitted with osteomyelitis R foot, now status post R transmet amputation 05/28/15 (NWB R LE with wound vac intact)  Clinical Impression  Upon evaluation, patient alert and oriented to basic information; but with noted difficulty processing, sequencing functional movement transitions.  Voices understanding of NWB R LE, but requires max cuing from therapist for adherence with functional activities (often attempting to place L LE in NWB rather than R LE).  Currently requiring mod assist for all bed mobility, boosting and sit/stand transfers with RW; poor balance, poor ability to maintain NWB R LE.  Unsafe to attempt additional mobility/gait at this time.  May consider trials of lateral scoot/pivot transfers in subsequent sessions to promote optimal adherence to NWB R LE.  Recommend WC level as primary mobility until ability to understand/maintain NWB improves. Would benefit from skilled PT to address above deficits and promote optimal return to PLOF; recommend transition to STR upon discharge from acute hospitalization.     Follow Up Recommendations SNF    Equipment Recommendations       Recommendations for Other Services       Precautions / Restrictions Precautions Precautions: Fall Restrictions Weight Bearing Restrictions: Yes RLE Weight Bearing: Non weight bearing      Mobility  Bed Mobility Overal bed mobility: Needs Assistance Bed Mobility: Supine to Sit     Supine to sit: Mod assist     General bed mobility comments: for movement sequencing, truncal elevation and forward scooting towards edge of bed  Transfers Overall transfer level: Needs assistance Equipment used: Rolling walker (2 wheeled) Transfers: Sit to/from Stand Sit to Stand:  Mod assist         General transfer comment: extensive cuing/assist for NWB R LE; tends to pull up on RW  Ambulation/Gait             General Gait Details: unsafe/unable due to limited comprehension and adherence to NWB R LE  Stairs            Wheelchair Mobility    Modified Rankin (Stroke Patients Only)       Balance Overall balance assessment: Needs assistance Sitting-balance support: No upper extremity supported;Feet supported Sitting balance-Leahy Scale: Fair     Standing balance support: Bilateral upper extremity supported Standing balance-Leahy Scale: Poor                               Pertinent Vitals/Pain Pain Assessment: 0-10 Pain Score: 9  Pain Descriptors / Indicators: Aching;Grimacing Pain Intervention(s): Monitored during session;Limited activity within patient's tolerance;Repositioned;Patient requesting pain meds-RN notified;RN gave pain meds during session    Home Living Family/patient expects to be discharged to:: Private residence Living Arrangements: Spouse/significant other Available Help at Discharge: Family Type of Home: House Home Access: Stairs to enter Entrance Stairs-Rails: Can reach both Entrance Stairs-Number of Steps: 2 Home Layout: Two level;Able to live on main level with bedroom/bathroom Home Equipment: [name] - 2 wheels;Cane - single point      Prior Function Level of Independence: Independent with assistive device(s)         Comments: Uses RW for all mobility. Does not perform community distance ambualtion due to self reports fo low energy and activity tolerance.  Does endorse single fall within previous six  months     Hand Dominance        Extremity/Trunk Assessment   Upper Extremity Assessment: Overall WFL for tasks assessed           Lower Extremity Assessment:  (R ankle DF to neutral (limited by post-op dressing), otherwise bilat LE  ROM grossly WFL, strength at least 4-/5 throughout.   Denies sensory deficit)         Communication      Cognition Arousal/Alertness: Awake/alert Behavior During Therapy: WFL for tasks assessed/performed Overall Cognitive Status:  (pleasantly confused; difficulty processing and sequencing NWB status and functional activities at times)                      General Comments General comments (skin integrity, edema, etc.): R LE with post-op dressing and wound vac intact    Exercises Other Exercises Other Exercises: Boosting edge of bed, emphasis on NWB R LE, mod assist.  Significant difficulty undersatnding, initiating and sequencing activity.  Unable to fully clear buttocks. Other Exercises: Lateral scooting edge of bed, close sup/min assist--may consider trials of lateral scoot/pivot transfers in subsequent sessions.      Assessment/Plan    PT Assessment Patient needs continued PT services  PT Diagnosis Difficulty walking;Generalized weakness;Acute pain   PT Problem List Decreased strength;Decreased activity tolerance;Decreased balance;Decreased mobility;Decreased coordination;Decreased cognition;Decreased knowledge of use of DME;Decreased safety awareness;Decreased knowledge of precautions;Decreased skin integrity;Pain  PT Treatment Interventions DME instruction;Gait training;Stair training;Functional mobility training;Therapeutic activities;Therapeutic exercise;Balance training;Patient/family education;Cognitive remediation   PT Goals (Current goals can be found in the Care Plan section) Acute Rehab PT Goals Patient Stated Goal: patient agreeable to session; family agreeable/intersted in STR PT Goal Formulation: With patient Time For Goal Achievement: 06/13/15 Potential to Achieve Goals: Good    Frequency 7X/week   Barriers to discharge Inaccessible home environment;Decreased caregiver support      Co-evaluation               End of Session Equipment Utilized During Treatment: Gait belt Activity Tolerance:  Patient tolerated treatment well Patient left: in bed;with call bell/phone within reach;with bed alarm set;with family/visitor present Nurse Communication: Mobility status;Patient requests pain meds         Time: 1425-1450 PT Time Calculation (min) (ACUTE ONLY): 25 min   Charges:   PT Evaluation $PT Eval Moderate Complexity: 1 Procedure PT Treatments $Therapeutic Activity: 8-22 mins   PT G Codes:        Kalmen Lollar H. Owens Shark, PT, DPT, NCS 05/30/2015, 3:16 PM [phone]

## 2015-05-30 NOTE — Discharge Instructions (Signed)
Wound vac orders per dr cline PICC line care per protocol [facility]

## 2015-05-30 NOTE — Clinical Social Work Placement (Signed)
   CLINICAL SOCIAL WORK PLACEMENT  NOTE  Date:  05/30/2015  Patient Details  Name: [name] MRN: [id number] Date of Birth: Sep 04, 1945  Clinical Social Work is seeking post-discharge placement for this patient at the [place] level of care (*CSW will initial, date and re-position this form in  chart as items are completed):  Yes   Patient/family provided with [place] Work Department's list of facilities offering this level of care within the geographic area requested by the patient (or if unable, by the patient's family).  Yes   Patient/family informed of their freedom to choose among providers that offer the needed level of care, that participate in Medicare, Medicaid or managed care program needed by the patient, have an available bed and are willing to accept the patient.  Yes   Patient/family informed of [facility]'s ownership interest in [facility] and [facility], as well as of the fact that they are under no obligation to receive care at these facilities.  PASRR submitted to EDS on       PASRR number received on       Existing PASRR number confirmed on 05/30/15     FL2 transmitted to all facilities in geographic area requested by pt/family on 05/30/15     FL2 transmitted to all facilities within larger geographic area on       Patient informed that his/her managed care company has contracts with or will negotiate with certain facilities, including the following:        Yes   Patient/family informed of bed offers received.  Patient chooses bed at  ([facility] )     Physician recommends and patient chooses bed at      Patient to be transferred to  ([facility] ) on 05/30/15.  Patient to be transferred to facility by  [facility] )     Patient family notified on 05/30/15 of transfer.  Name of family member notified:   (Patient's husband [name] and son [name] were at bedside and aware of D/C. )     PHYSICIAN        Additional Comment:    _______________________________________________ [name], LCSW 05/30/2015, 3:47 PM

## 2015-05-30 NOTE — Progress Notes (Signed)
Patient's family wants her to go to rehabilitation and now they've changed their mind. I have done the discharge summary instructions and have spoken with the care manager who will discuss with social worker to see if patient can go to rehabilitation today. She is medically stable for discharge.

## 2015-05-30 NOTE — Discharge Summary (Addendum)
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 Hypertension   . Diabetes mellitus without complication ([place])   . Anemia   . Coronary artery disease   . CHF (congestive heart failure) ([place])   . Glaucoma   . Hyperlipidemia   . Neuropathy ([place])   . Chronic anemia   . Stroke ([place])   . Anginal pain ([place])   . Arthritis   . Retinopathy   . Cardiomyopathy ([place])   . Carotid artery stenosis   . Hoarseness, chronic   . GERD (gastroesophageal reflux disease)   . Hemangioma of liver   . Weight loss, unintentional   . DDD (degenerative disc disease), lumbar   . Osteomyelitis of foot, right, acute ([place])   . Chronic kidney disease     Stage 3    HOSPITAL COURSE:  70 year old female with a history of diabetes, CAD and peripheral vascular disease who presents with osteomyelitis in the third metatarsal phalangeal joint.   1. Osteomyelitis of the right third metatarsal in a patient with diabetes and peripheral vascular disease: Patient is postoperative day #2 for transmetatarsal amputation and now has a wound VAC. - Appreciate podiatry consultation. Continue Zosyn and by mouth Bactrim for 2 weeks Dr. Ola Spurr recommendations  -PICC line placement on 05/30/2015  -Previous cultures show Enterobacter CLOACAE and STENOTROPHOMONAS MALTOPHILIA and LIGHT GROWTH ENTEROCOCCUS FAECIUM  Continue wound VAC.  -Podiatry will follow up next Tuesday for removal of the back and reevaluation of  the wound. -Patient is adamantly refusing SNf. We'll arrange home health RN physical therapy.  2. Diabetes: As per diabetes coordinator consultation discontinued Amaryl, continue siding scale insulin and and change POCT to every 4 hours. Her sugars are very well controlled at present. She began Having hypoglycemia heads by mouth medicines have been discontinued.  3. Essential hypertension: Continue clonidine, hydralazine, isosorbide, ramipril, propanolol and labetalol.  4. Hyperlipidemia: Continue simvastatin  5. History of CAD: Continue isosorbide, simvastatin and propanolol.  6. Acute blood loss anemia on Anemia of chronic disease and iron deficiency: Continue ferrous sulfate and folic acid. Hemoglobin is down to 7.5 after surgery.   7. Chronic kidney disease stage III: Patient's creatinine remains stable. Continue to monitor. Hold nephrotoxic agents.  Overall stable. Will DC to home with home health. CONSULTS OBTAINED:  Treatment Team:  Adrian Prows, MD  DRUG ALLERGIES:   Allergies  Allergen Reactions  . Codeine Other (See Comments)    Reaction:  Hallucinations    DISCHARGE MEDICATIONS:   Current Discharge Medication List    START taking these medications   Details  calcium-vitamin D (OSCAL WITH D) 500-200 MG-UNIT tablet Take 1 tablet by mouth 2 (two) times daily. Qty: 60 tablet, Refills: 2    insulin aspart (NOVOLOG) 100 UNIT/ML injection Inject 0-9 Units into the skin 3 (three) times daily with meals. Qty: 10 mL, Refills: 11    oxyCODONE-acetaminophen (ROXICET) 5-325 MG tablet Take 1-2 tablets by mouth every 4 (four) hours as needed for severe pain. Qty: 50 tablet,  Refills: 0    piperacillin-tazobactam (ZOSYN) 3.375 GM/50ML IVPB Inject 50 mLs (3.375 g total) into the vein every 8 (eight) hours. Qty: 50 mL, Refills: 0    sulfamethoxazole-trimethoprim (BACTRIM DS,SEPTRA DS) 800-160 MG tablet Take 1 tablet by mouth every 12 (twelve) hours. Qty: 30 tablet,  Refills: 0      CONTINUE these medications which have NOT CHANGED   Details  acetaminophen (TYLENOL) 325 MG tablet Take 2 tablets (650 mg total) by mouth every 4 (four) hours as needed for mild pain (or temp > 99 F).    cloNIDine (CATAPRES) 0.2 MG tablet Take 1 tablet (0.2 mg total) by mouth 3 (three) times daily. Qty: 90 tablet, Refills: 0    docusate sodium (COLACE) 100 MG capsule Take 1 capsule (100 mg total) by mouth 2 (two) times daily. Qty: 60 capsule, Refills: 11   Associated Diagnoses: Constipation, unspecified constipation type    ferrous sulfate 325 (65 FE) MG tablet Take 325 mg by mouth daily.    folic acid (FOLVITE) 1 MG tablet Take 1 tablet (1 mg total) by mouth daily. Qty: 60 tablet, Refills: 1    furosemide (LASIX) 40 MG tablet Take 1 tablet (40 mg total) by mouth 2 (two) times daily. Qty: 60 tablet, Refills: 5    hydrALAZINE (APRESOLINE) 100 MG tablet Take 100 mg by mouth 3 (three) times daily.     labetalol (NORMODYNE) 100 MG tablet Take 100 mg by mouth 2 (two) times daily. Reported on 05/27/2015    minocycline (MINOCIN,DYNACIN) 100 MG capsule Take 1 capsule (100 mg total) by mouth 2 (two) times daily. Qty: 46 capsule, Refills: 0    omeprazole (PRILOSEC) 20 MG capsule Take 20 mg by mouth daily.    potassium chloride SA (K-DUR,KLOR-CON) 20 MEQ tablet Take 1 tablet (20 mEq total) by mouth daily. Qty: 30 tablet, Refills: 5    propranolol (INDERAL) 20 MG tablet Take 40 mg by mouth 3 (three) times daily.    ramipril (ALTACE) 5 MG capsule Take 5 mg by mouth daily.    feeding supplement, GLUCERNA SHAKE, (GLUCERNA SHAKE) LIQD Take 237 mLs by mouth 2 (two) times daily between meals. Qty: 60 Can, Refills: 0    isosorbide dinitrate (ISORDIL) 30 MG tablet Take 30 mg by mouth 2 (two) times daily. Reported on 05/27/2015    meloxicam (MOBIC) 7.5 MG tablet Take 7.5 mg by mouth daily. Reported on 05/27/2015    mirtazapine (REMERON) 15 MG tablet Take 15 mg by mouth at  bedtime. Reported on 05/27/2015    nitroGLYCERIN (NITROSTAT) 0.4 MG SL tablet Place 0.4 mg under the tongue every 5 (five) minutes as needed for chest pain.    OMEGA-3 FATTY ACIDS PO Take 1 capsule by mouth daily. Reported on 05/27/2015    simvastatin (ZOCOR) 20 MG tablet Take 20 mg by mouth at bedtime. Reported on 05/27/2015    traMADol (ULTRAM) 50 MG tablet Take 0.5-1 tablets (25-50 mg total) by mouth 3 (three) times daily as needed for moderate pain. Qty: 10 tablet, Refills: 0      STOP taking these medications     glimepiride (AMARYL) 2 MG tablet      amoxicillin-clavulanate (AUGMENTIN) 875-125 MG tablet      cefTAZidime 1 g in dextrose 5 % 50 mL      HYDROcodone-acetaminophen (NORCO) 5-325 MG tablet         If you experience worsening of your admission symptoms, develop shortness of breath, life threatening emergency, suicidal or homicidal  thoughts you must seek medical attention immediately by calling 911 or calling your MD immediately  if symptoms less severe.  You Must read complete instructions/literature along with all the possible adverse reactions/side effects for all the Medicines you take and that have been prescribed to you. Take any new Medicines after you have completely understood and accept all the possible adverse reactions/side effects.   Please note  You were cared for by a hospitalist during your hospital stay. If you have any questions about your discharge medications or the care you received while you were in the hospital after you are discharged, you can call the unit and asked to speak with the hospitalist on call if the hospitalist that took care of you is not available. Once you are discharged, your primary care physician will handle any further medical issues. Please note that NO REFILLS for any discharge medications will be authorized once you are discharged, as it is imperative that you return to your primary care physician (or establish a relationship with  a primary care physician if you do not have one) for your aftercare needs so that they can reassess your need for medications and monitor your lab values. Today   SUBJECTIVE   No complaints  VITAL SIGNS:  Blood pressure 142/56, pulse 70, temperature 98.7 F (37.1 C), temperature source Oral, resp. rate 18, height 5\' 7"  (1.702 m), weight 64.864 kg (143 lb), SpO2 96 %.  I/O:    Intake/Output Summary (Last 24 hours) at 05/30/15 1411 Last data filed at 05/30/15 0836  Gross per 24 hour  Intake    283 ml  Output    575 ml  Net   -292 ml    PHYSICAL EXAMINATION:  GENERAL:  70 y.o.-year-old patient lying in the bed with no acute distress.  EYES: Pupils equal, round, reactive to light and accommodation. No scleral icterus. Extraocular muscles intact.  HEENT: Head atraumatic, normocephalic. Oropharynx and nasopharynx clear.  NECK:  Supple, no jugular venous distention. No thyroid enlargement, no tenderness.  LUNGS: Normal breath sounds bilaterally, no wheezing, rales,rhonchi or crepitation. No use of accessory muscles of respiration.  CARDIOVASCULAR: S1, S2 normal. No murmurs, rubs, or gallops.  ABDOMEN: Soft, non-tender, non-distended. Bowel sounds present. No organomegaly or mass.  EXTREMITIES: No pedal edema, cyanosis, or clubbing. Right lower extremitywound VAC present NEUROLOGIC: Cranial nerves II through XII are intact. Muscle strength 5/5 in all extremities. Sensation intact. Gait not checked.  PSYCHIATRIC: The patient is alert and oriented x 3.  SKIN: No obvious rash, lesion, or ulcer.   DATA REVIEW:   CBC   Recent Labs Lab 05/30/15 0319  WBC 6.5  HGB 7.8*  HCT 24.7*  PLT 139*    Chemistries   Recent Labs Lab 05/30/15 0319  NA 140  K 4.1  CL 116*  CO2 19*  GLUCOSE 86  BUN 41*  CREATININE 2.08*  CALCIUM 7.3*    Microbiology Results   Recent Results (from the past 240 hour(s))  Surgical pcr screen     Status: None   Collection Time: 05/23/15 11:57 AM   Result Value Ref Range Status   MRSA, PCR NEGATIVE NEGATIVE Final   Staphylococcus aureus NEGATIVE NEGATIVE Final    Comment:        The Xpert SA Assay (FDA approved for NASAL specimens in patients over 21 years of age), is one component of a comprehensive surveillance program.  Test performance has been validated by [facility] for patients greater than or equal  to 46 year old. It is not intended to diagnose infection nor to guide or monitor treatment.   Surgical pcr screen     Status: None   Collection Time: 05/27/15  3:41 PM  Result Value Ref Range Status   MRSA, PCR NEGATIVE NEGATIVE Final   Staphylococcus aureus NEGATIVE NEGATIVE Final    Comment:        The Xpert SA Assay (FDA approved for NASAL specimens in patients over 52 years of age), is one component of a comprehensive surveillance program.  Test performance has been validated by [facility] for patients greater than or equal to 28 year old. It is not intended to diagnose infection nor to guide or monitor treatment.     RADIOLOGY:  Dg Chest Port 1 View  05/29/2015  CLINICAL DATA:  PICC line placement. Recently postop for metatarsal amputation. EXAM: PORTABLE CHEST 1 VIEW COMPARISON:  04/05/2015 FINDINGS: A new right arm PICC line is seen with tip overlying the inferior aspect of the right atrium, approximately 5 cm below the superior cavoatrial junction. No pneumothorax visualized Decreased left pleural effusion and left basilar atelectasis is demonstrated. Increased atelectasis seen in the right lung base. Heart size remains stable IMPRESSION: New right arm PICC line tip overlies the inferior aspect of the right atrium, approximately 5 cm below the superior cavoatrial junction. Decreased left pleural effusion and left lower lobe atelectasis. Increased Right basilar atelectasis. Electronically Signed   By: Earle Gell M.D.   On: 05/29/2015 12:49     Management plans discussed with the patient, family and they  are in agreement.  CODE STATUS:     Code Status Orders        Start     Ordered   05/28/15 1457  Full code   Continuous     05/28/15 1456    Code Status History    Date Active Date Inactive Code Status Order ID Comments User Context   05/27/2015 11:34 AM 05/28/2015  2:56 PM Full Code [id number]  Fritzi Mandes, MD Inpatient   04/03/2015  7:22 PM 04/08/2015  9:33 PM Full Code [id number]  Sharlotte Alamo, MD Inpatient   04/02/2015 11:59 AM 04/03/2015  7:22 PM Full Code [id number]  Hillary Bow, MD Inpatient   03/01/2015  8:36 AM 03/01/2015  1:28 PM Full Code [id number]  [name], DPM Inpatient   01/29/2015  8:45 PM 02/02/2015  9:29 PM Full Code [id number]  Aldean Jewett, MD Inpatient   12/06/2014  5:15 PM 12/13/2014 10:23 PM Full Code [id number]  Newman Pies, MD Inpatient   12/03/2014  9:19 PM 12/06/2014  5:15 PM Full Code [id number]  Alexis Goodell, MD Inpatient   10/03/2014  3:55 AM 10/09/2014  6:14 PM Full Code [id number]  Juluis Mire, MD Inpatient   09/17/2014  2:00 PM 09/20/2014  5:51 PM Full Code [id number]  Theodoro Grist, MD Inpatient    Advance Directive Documentation        Most Recent Value   Type of Advance Directive  Healthcare Power of Attorney, Living will   Pre-existing out of facility DNR order (yellow form or pink MOST form)     "MOST" Form in Place?        TOTAL TIME TAKING CARE OF THIS PATIENT: 40 minutes.    Sharra Cayabyab M.D on 05/30/2015 at 2:11 PM  Between 7am to 6pm - Pager - [phone] After 6pm go to [email or web address] - password [email or web address] [facility]  [place] [facility]  Office  [phone]  CC:  Primary care physician; [facility]

## 2015-05-30 NOTE — Care Management (Addendum)
Colletta Maryland with [place] asked to  Check with [facility] to see if patient would be capable of administering home IV ABX as Dava RN shared concerned for patient compliance with taking home Rx on as she is supposed to. This was discussed at progression on Tuesday morning with CSW. Also notified by [place] that cost of IV ABX will be ~$400. Colletta Maryland with [facility] will speak with patient about that but CM may need to talk to ID to see if there is an alternative tx.

## 2015-05-30 NOTE — NC FL2 (Signed)
[place] LEVEL OF CARE SCREENING TOOL     IDENTIFICATION  Patient Name: [name] Birthdate: June 19, 1945 Sex: female Admission Date (Current Location): 05/27/2015  [place] and [place] Number:  Engineering geologist and Address:  [facility], [address] [place], [place] [address]      Provider Number: [id number]  Attending Physician Name and Address:  Fritzi Mandes, MD  Relative Name and Phone Number:       Current Level of Care: Hospital Recommended Level of Care: [place] Prior Approval Number:    Date Approved/Denied:   PASRR Number:  ( [id number] )  Discharge Plan: SNF    Current Diagnoses: Patient Active Problem List   Diagnosis Date Noted  . Osteomyelitis ([place]) 05/27/2015  . Diabetic osteomyelitis ([place]) 04/02/2015  . Osteomyelitis of ankle or foot ([place]) 04/02/2015  . Weight loss 02/21/2015  . Folate deficiency 02/21/2015  . Cellulitis of great toe of right foot 02/02/2015  . Diabetes mellitus ([place]) 02/02/2015  . CKD (chronic kidney disease), stage III 02/02/2015  . Anemia of chronic disease 02/02/2015  . Anemia 01/30/2015  . Infected wound ([place]) 01/29/2015  . Subdural hematoma ([place]) 12/06/2014  . Pressure ulcer 12/05/2014  . SDH (subdural hematoma) ([place]) 12/03/2014  . Chronic diastolic heart failure ([place]) 11/02/2014  . Melena 10/02/2014  . DM (diabetes mellitus) ([place]) 02/04/2010  . Deficiency anemia 02/04/2010  . Essential hypertension 02/04/2010  . Coronary atherosclerosis 02/04/2010    Orientation RESPIRATION BLADDER Height & Weight    Self, Time, Situation, Place  Normal Continent 5\' 7"  (170.2 cm) 143 lbs.  BEHAVIORAL SYMPTOMS/MOOD NEUROLOGICAL BOWEL NUTRITION STATUS   (none )  (none ) Continent Diet (NPO for surgery )  AMBULATORY STATUS COMMUNICATION OF NEEDS Skin   Extensive Assist Verbally Surgical wounds (transmetatarsal amputation )                       Personal Care  Assistance Level of Assistance  Bathing, Feeding, Dressing Bathing Assistance: Limited assistance Feeding assistance: Independent Dressing Assistance: Limited assistance     Functional Limitations Info  Sight, Hearing, Speech Sight Info: Adequate Hearing Info: Adequate Speech Info: Adequate    SPECIAL CARE FACTORS FREQUENCY  PT (By licensed PT), OT (By licensed OT)   Haw Provenia Wound Vac on incision and will be taken off in Dr. Sammuel Bailiff office next week.  2 weeks IV ABX     PT Frequency:  (5) OT Frequency:  (5)            Contractures      Additional Factors Info  Code Status, Allergies, Isolation Precautions Code Status Info:  (Full Code. ) Allergies Info:  (Codeine)     Isolation Precautions Info:  (ESBL in Wound )     Current Medications (05/30/2015):  This is the current hospital active medication list Current Facility-Administered Medications  Medication Dose Route Frequency Provider Last Rate Last Dose  . 0.9 %  sodium chloride infusion  250 mL Intravenous PRN Sharlotte Alamo, DPM      . acetaminophen (TYLENOL) tablet 650 mg  650 mg Oral Q6H PRN Fritzi Mandes, MD   650 mg at 05/29/15 0410   Or  . acetaminophen (TYLENOL) suppository 650 mg  650 mg Rectal Q6H PRN Fritzi Mandes, MD      . calcium-vitamin D (OSCAL WITH D) 500-200 MG-UNIT per tablet 1 tablet  1 tablet Oral BID Bettey Costa, MD   1 tablet  at 05/30/15 0950  . cloNIDine (CATAPRES) tablet 0.2 mg  0.2 mg Oral TID Fritzi Mandes, MD   0.2 mg at 05/30/15 0948  . docusate sodium (COLACE) capsule 100 mg  100 mg Oral BID Fritzi Mandes, MD   100 mg at 05/30/15 0950  . enoxaparin (LOVENOX) injection 30 mg  30 mg Subcutaneous Q24H Darylene Price [place], RPH   30 mg at 05/29/15 2203  . feeding supplement (GLUCERNA SHAKE) (GLUCERNA SHAKE) liquid 237 mL  237 mL Oral BID BM Fritzi Mandes, MD   237 mL at 05/30/15 0951  . ferrous sulfate tablet 325 mg  325 mg Oral Daily Fritzi Mandes, MD   325 mg at 05/30/15 0950  . folic acid (FOLVITE) tablet 1 mg  1  mg Oral Daily Fritzi Mandes, MD   1 mg at 05/30/15 0950  . furosemide (LASIX) tablet 40 mg  40 mg Oral BID Fritzi Mandes, MD   40 mg at 05/30/15 0948  . hydrALAZINE (APRESOLINE) tablet 100 mg  100 mg Oral TID Fritzi Mandes, MD   100 mg at 05/30/15 1018  . insulin aspart (novoLOG) injection 0-5 Units  0-5 Units Subcutaneous QHS Bettey Costa, MD   0 Units at 05/29/15 2206  . insulin aspart (novoLOG) injection 0-9 Units  0-9 Units Subcutaneous TID WC Fritzi Mandes, MD   1 Units at 05/29/15 1625  . isosorbide dinitrate (ISORDIL) tablet 30 mg  30 mg Oral BID Fritzi Mandes, MD   30 mg at 05/30/15 1018  . labetalol (NORMODYNE) tablet 100 mg  100 mg Oral BID Fritzi Mandes, MD   100 mg at 05/30/15 0950  . mirtazapine (REMERON) tablet 15 mg  15 mg Oral QHS Fritzi Mandes, MD   15 mg at 05/28/15 2119  . morphine 2 MG/ML injection 2 mg  2 mg Intravenous Q2H PRN Sharlotte Alamo, DPM   2 mg at 05/28/15 2121  . morphine 4 MG/ML injection 4 mg  4 mg Intravenous Q2H PRN Sharlotte Alamo, DPM      . nitroGLYCERIN (NITROSTAT) SL tablet 0.4 mg  0.4 mg Sublingual Q5 min PRN Fritzi Mandes, MD      . ondansetron (ZOFRAN) tablet 4 mg  4 mg Oral Q6H PRN Fritzi Mandes, MD       Or  . ondansetron (ZOFRAN) injection 4 mg  4 mg Intravenous Q6H PRN Fritzi Mandes, MD      . oxyCODONE-acetaminophen (PERCOCET/ROXICET) 5-325 MG per tablet 1-2 tablet  1-2 tablet Oral Q4H PRN Sharlotte Alamo, DPM   1 tablet at 05/30/15 1017  . pantoprazole (PROTONIX) EC tablet 40 mg  40 mg Oral Daily Fritzi Mandes, MD   40 mg at 05/30/15 0950  . piperacillin-tazobactam (ZOSYN) IVPB 3.375 g  3.375 g Intravenous [address] [place], RPH   3.375 g at 05/30/15 1019  . polyethylene glycol (MIRALAX / GLYCOLAX) packet 17 g  17 g Oral Daily PRN Fritzi Mandes, MD      . potassium chloride SA (K-DUR,KLOR-CON) CR tablet 20 mEq  20 mEq Oral Daily Fritzi Mandes, MD   20 mEq at 05/30/15 0951  . propranolol (INDERAL) tablet 40 mg  40 mg Oral TID Fritzi Mandes, MD   40 mg at 05/30/15 0949  . ramipril (ALTACE) capsule 5 mg  5  mg Oral Daily Fritzi Mandes, MD   5 mg at 05/30/15 0951  . senna (SENOKOT) tablet 8.6 mg  1 tablet Oral BID Fritzi Mandes, MD   8.6 mg at 05/30/15 0948  .  simvastatin (ZOCOR) tablet 20 mg  20 mg Oral QHS Fritzi Mandes, MD   20 mg at 05/28/15 2119  . sodium chloride flush (NS) 0.9 % injection 10 mL  10 mL Intravenous Q12H Bettey Costa, MD   10 mL at 05/30/15 0952  . sodium chloride flush (NS) 0.9 % injection 3 mL  3 mL Intravenous Q12H Sharlotte Alamo, DPM   3 mL at 05/29/15 2204  . sodium chloride flush (NS) 0.9 % injection 3 mL  3 mL Intravenous PRN Sharlotte Alamo, DPM      . sulfamethoxazole-trimethoprim (BACTRIM DS,SEPTRA DS) 800-160 MG per tablet 1 tablet  1 tablet Oral Q12H Lenis Noon, [facility]   1 tablet at 05/30/15 [id number]     Discharge Medications: Please see discharge summary for a list of discharge medications.  Relevant Imaging Results:  Relevant Lab Results:   Additional Information  (SSN: [id number])  [name], LCSW

## 2015-05-30 NOTE — Care Management (Signed)
Patient suffers from right foot osteomylitis which impairs their ability to perform daily activities like toileting, feeding, dressing, grooming, bathing in the home. A cane, walker, crutch  will not resolve issue with performing activities of daily living. A wheelchair will allow patient to safely perform daily activities. Patient can safely propel the wheelchair in the home or has a caregiver who can provide assistance. Rx placed in patient's chart.   She now agrees to SNF- CSW notified.

## 2015-05-30 NOTE — Progress Notes (Signed)
2 Days Post-Op  Subjective: Patient seen. Feeling okay. States she does not want to go to skilled nursing.  Objective: Vital signs in last 24 hours: Temp:  [97.9 F (36.6 C)-98.9 F (37.2 C)] 98.7 F (37.1 C) (01/26 0730) Pulse Rate:  [60-70] 70 (01/26 0948) Resp:  [16-18] 18 (01/26 0730) BP: (100-148)/(40-56) 142/56 mmHg (01/26 0948) SpO2:  [96 %-100 %] 96 % (01/26 0322) Last BM Date: 05/29/15  Intake/Output from previous day: 01/25 0701 - 01/26 0700 In: 403 [P.O.:120; I.V.:3; Blood:280] Out: 575 [Urine:575] Intake/Output this shift:    Wound VAC is intact with good seal. No significant drainage in the tubing.  Lab Results:   Recent Labs  05/29/15 0642 05/29/15 1733 05/30/15 0319  WBC 5.3  --  6.5  HGB 7.5* 8.1* 7.8*  HCT 23.2* 25.2* 24.7*  PLT 155  --  139*   BMET  Recent Labs  05/29/15 0642 05/30/15 0319  NA 135 140  K 3.7 4.1  CL 112* 116*  CO2 18* 19*  GLUCOSE 124* 86  BUN 36* 41*  CREATININE 1.80* 2.08*  CALCIUM 7.1* 7.3*   PT/INR No results for input(s): LABPROT, INR in the last 72 hours. ABG No results for input(s): PHART, HCO3 in the last 72 hours.  Invalid input(s): PCO2, PO2  Studies/Results: Dg Chest Port 1 View  05/29/2015  CLINICAL DATA:  PICC line placement. Recently postop for metatarsal amputation. EXAM: PORTABLE CHEST 1 VIEW COMPARISON:  04/05/2015 FINDINGS: A new right arm PICC line is seen with tip overlying the inferior aspect of the right atrium, approximately 5 cm below the superior cavoatrial junction. No pneumothorax visualized Decreased left pleural effusion and left basilar atelectasis is demonstrated. Increased atelectasis seen in the right lung base. Heart size remains stable IMPRESSION: New right arm PICC line tip overlies the inferior aspect of the right atrium, approximately 5 cm below the superior cavoatrial junction. Decreased left pleural effusion and left lower lobe atelectasis. Increased Right basilar atelectasis.  Electronically Signed   By: Earle Gell M.D.   On: 05/29/2015 12:49    Anti-infectives: Anti-infectives    Start     Dose/Rate Route Frequency Ordered Stop   05/29/15 1500  sulfamethoxazole-trimethoprim (BACTRIM DS,SEPTRA DS) 800-160 MG per tablet 1 tablet     1 tablet Oral Every 12 hours 05/29/15 1446 06/12/15 2359   05/28/15 1145  ceFAZolin (ANCEF) IVPB 2 g/50 mL premix     2 g 100 mL/hr over 30 Minutes Intravenous  Once 05/28/15 1144 05/28/15 1233   05/27/15 1800  piperacillin-tazobactam (ZOSYN) IVPB 3.375 g     3.375 g 12.5 mL/hr over 240 Minutes Intravenous Every 8 hours 05/27/15 1640 06/12/15 2359   05/27/15 1200  clindamycin (CLEOCIN) IVPB 600 mg  Status:  Discontinued     600 mg 100 mL/hr over 30 Minutes Intravenous 3 times per day 05/27/15 1134 05/27/15 1624      Assessment/Plan: s/p Procedure(s): TRANSMETATARSAL AMPUTATION (Right) Plan: We will leave the wound VAC intact. PICC line in place and she will be on antibiotics for a couple of weeks during the postoperative period. Patient is scheduled to be followed up outpatient next Tuesday. She is stable for transfer or discharge as far as her foot goes  LOS: 3 days    [name] W. 05/30/2015

## 2015-05-30 NOTE — Progress Notes (Signed)
Patient changed her mind and is now agreeable to SNF. FL2 faxed out and bed offers presented. Patient chose [facility].   Patient is medically stable for D/C to [facility] today. Per Dr. Cleda Mccreedy patient has a provenia wound vac that will stay on and go with her to [facility]. Per Dr. Cleda Mccreedy he will take wound vac off in his office next week. Per [name] liaison patient can come today to room 711. RN will call report to Yaakov Guthrie and arrange EMS for transport. [facility] authorization has been received. Auth # [id number]. Clinical Education officer, museum (CSW) sent D/C Summary, FL2 and D/C Packet to [facility] today. Patient's sisters, husband and son were at bedside and aware of above. Please reconsult if future social work needs arise. CSW signing off.   Blima Rich, LCSW [phone]

## 2015-05-30 NOTE — Anesthesia Postprocedure Evaluation (Signed)
Anesthesia Post Note  Patient: [name]  Procedure(s) Performed: Procedure(s) (LRB): TRANSMETATARSAL AMPUTATION (Right)  Patient location during evaluation: PACU Anesthesia Type: General Level of consciousness: awake and alert Pain management: pain level controlled Vital Signs Assessment: post-procedure vital signs reviewed and stable Respiratory status: spontaneous breathing, nonlabored ventilation, respiratory function stable and patient connected to nasal cannula oxygen Cardiovascular status: blood pressure returned to baseline and stable Postop Assessment: no signs of nausea or vomiting Anesthetic complications: no    Last Vitals:  Filed Vitals:   05/30/15 0322 05/30/15 0730  BP: 140/44 148/50  Pulse: 66 68  Temp: 36.9 C 37.1 C  Resp: 18 18    Last Pain:  Filed Vitals:   05/30/15 0752  PainSc: Asleep                 Martha Clan

## 2015-05-30 NOTE — Progress Notes (Signed)
Per MD note patient will require 2 weeks of I.V Abx and has a wound vac. PT is pending. Clinical Education officer, museum (CSW) met with patient and her son [name] was at bedside. CSW discussed SNF placement for a second time with patient and son. CSW emphasized that patient will have complex medical needs that can be better managed in a SNF. Patient adamantly refused SNF. CSW provided emotional support. Patient continued to refuse SNF throughout conversation. Patient reported that she is going home. RN Case Manager aware of above. CSW will continue to follow and assist as needed.   Blima Rich, LCSW [phone]

## 2015-06-04 ENCOUNTER — Other Ambulatory Visit
Admission: RE | Admit: 2015-06-04 | Discharge: 2015-06-04 | Disposition: A | Discharge status: Home / Self Care | Payer: Commercial Managed Care - HMO | Source: Ambulatory Visit | Attending: Emergency Medicine | Admitting: Emergency Medicine

## 2015-06-04 ENCOUNTER — Inpatient Hospital Stay
Admission: AD | Admit: 2015-06-04 | Discharge: 2015-06-10 | DRG: 617 | Disposition: A | Discharge status: Skilled Nursing Facility | Payer: Commercial Managed Care - HMO | Source: Ambulatory Visit | Attending: Internal Medicine | Admitting: Internal Medicine

## 2015-06-04 DIAGNOSIS — I2510 Atherosclerotic heart disease of native coronary artery without angina pectoris: Secondary | ICD-10-CM | POA: Diagnosis present

## 2015-06-04 DIAGNOSIS — E11319 Type 2 diabetes mellitus with unspecified diabetic retinopathy without macular edema: Secondary | ICD-10-CM | POA: Diagnosis present

## 2015-06-04 DIAGNOSIS — Z801 Family history of malignant neoplasm of trachea, bronchus and lung: Secondary | ICD-10-CM | POA: Diagnosis not present

## 2015-06-04 DIAGNOSIS — N183 Chronic kidney disease, stage 3 (moderate): Secondary | ICD-10-CM | POA: Diagnosis present

## 2015-06-04 DIAGNOSIS — Z9889 Other specified postprocedural states: Secondary | ICD-10-CM

## 2015-06-04 DIAGNOSIS — E1152 Type 2 diabetes mellitus with diabetic peripheral angiopathy with gangrene: Secondary | ICD-10-CM | POA: Diagnosis present

## 2015-06-04 DIAGNOSIS — Z833 Family history of diabetes mellitus: Secondary | ICD-10-CM

## 2015-06-04 DIAGNOSIS — D509 Iron deficiency anemia, unspecified: Secondary | ICD-10-CM | POA: Diagnosis present

## 2015-06-04 DIAGNOSIS — E875 Hyperkalemia: Secondary | ICD-10-CM | POA: Diagnosis present

## 2015-06-04 DIAGNOSIS — E1122 Type 2 diabetes mellitus with diabetic chronic kidney disease: Secondary | ICD-10-CM | POA: Diagnosis present

## 2015-06-04 DIAGNOSIS — E1140 Type 2 diabetes mellitus with diabetic neuropathy, unspecified: Secondary | ICD-10-CM | POA: Diagnosis present

## 2015-06-04 DIAGNOSIS — I739 Peripheral vascular disease, unspecified: Secondary | ICD-10-CM | POA: Diagnosis present

## 2015-06-04 DIAGNOSIS — I509 Heart failure, unspecified: Secondary | ICD-10-CM | POA: Diagnosis present

## 2015-06-04 DIAGNOSIS — Z9841 Cataract extraction status, right eye: Secondary | ICD-10-CM

## 2015-06-04 DIAGNOSIS — D638 Anemia in other chronic diseases classified elsewhere: Secondary | ICD-10-CM | POA: Diagnosis present

## 2015-06-04 DIAGNOSIS — L89159 Pressure ulcer of sacral region, unspecified stage: Secondary | ICD-10-CM | POA: Diagnosis present

## 2015-06-04 DIAGNOSIS — I96 Gangrene, not elsewhere classified: Secondary | ICD-10-CM | POA: Diagnosis present

## 2015-06-04 DIAGNOSIS — H409 Unspecified glaucoma: Secondary | ICD-10-CM | POA: Diagnosis present

## 2015-06-04 DIAGNOSIS — M869 Osteomyelitis, unspecified: Secondary | ICD-10-CM | POA: Diagnosis present

## 2015-06-04 DIAGNOSIS — Z8249 Family history of ischemic heart disease and other diseases of the circulatory system: Secondary | ICD-10-CM

## 2015-06-04 DIAGNOSIS — Z89411 Acquired absence of right great toe: Secondary | ICD-10-CM | POA: Diagnosis not present

## 2015-06-04 DIAGNOSIS — Z886 Allergy status to analgesic agent status: Secondary | ICD-10-CM | POA: Diagnosis not present

## 2015-06-04 DIAGNOSIS — Z794 Long term (current) use of insulin: Secondary | ICD-10-CM | POA: Diagnosis not present

## 2015-06-04 DIAGNOSIS — Z79899 Other long term (current) drug therapy: Secondary | ICD-10-CM

## 2015-06-04 DIAGNOSIS — K219 Gastro-esophageal reflux disease without esophagitis: Secondary | ICD-10-CM | POA: Diagnosis present

## 2015-06-04 DIAGNOSIS — Z8673 Personal history of transient ischemic attack (TIA), and cerebral infarction without residual deficits: Secondary | ICD-10-CM

## 2015-06-04 DIAGNOSIS — E1169 Type 2 diabetes mellitus with other specified complication: Secondary | ICD-10-CM | POA: Diagnosis present

## 2015-06-04 DIAGNOSIS — Z955 Presence of coronary angioplasty implant and graft: Secondary | ICD-10-CM

## 2015-06-04 DIAGNOSIS — N179 Acute kidney failure, unspecified: Secondary | ICD-10-CM | POA: Diagnosis present

## 2015-06-04 DIAGNOSIS — I130 Hypertensive heart and chronic kidney disease with heart failure and stage 1 through stage 4 chronic kidney disease, or unspecified chronic kidney disease: Secondary | ICD-10-CM | POA: Diagnosis present

## 2015-06-04 LAB — CBC
HCT: 24 % — ABNORMAL LOW (ref 35.0–47.0)
HEMOGLOBIN: 7.9 g/dL — AB (ref 12.0–16.0)
MCH: 28.9 pg (ref 26.0–34.0)
MCHC: 32.8 g/dL (ref 32.0–36.0)
MCV: 88.1 fL (ref 80.0–100.0)
PLATELETS: 136 10*3/uL — AB (ref 150–440)
RBC: 2.72 MIL/uL — ABNORMAL LOW (ref 3.80–5.20)
RDW: 17.2 % — AB (ref 11.5–14.5)
WBC: 5 10*3/uL (ref 3.6–11.0)

## 2015-06-04 LAB — BASIC METABOLIC PANEL
Anion gap: 9 (ref 5–15)
BUN: 68 mg/dL — AB (ref 6–20)
CALCIUM: 7.7 mg/dL — AB (ref 8.9–10.3)
CHLORIDE: 111 mmol/L (ref 101–111)
CO2: 16 mmol/L — ABNORMAL LOW (ref 22–32)
CREATININE: 3.77 mg/dL — AB (ref 0.44–1.00)
GFR, EST AFRICAN AMERICAN: 13 mL/min — AB (ref >60)
GFR, EST NON AFRICAN AMERICAN: 11 mL/min — AB (ref >60)
Glucose, Bld: 216 mg/dL — ABNORMAL HIGH (ref 65–99)
Potassium: 5.3 mmol/L — ABNORMAL HIGH (ref 3.5–5.1)
SODIUM: 136 mmol/L (ref 135–145)

## 2015-06-04 LAB — GLUCOSE, CAPILLARY
Glucose-Capillary: 208 mg/dL — ABNORMAL HIGH (ref 65–99)
Glucose-Capillary: 190 mg/dL — ABNORMAL HIGH (ref 65–99)
Glucose-Capillary: 114 mg/dL — ABNORMAL HIGH (ref 65–99)

## 2015-06-04 LAB — SURGICAL PCR SCREEN
MRSA, PCR: NEGATIVE
STAPHYLOCOCCUS AUREUS: NEGATIVE

## 2015-06-04 MED ORDER — LABETALOL HCL 100 MG PO TABS
100.0000 mg | ORAL_TABLET | Freq: Two times a day (BID) | ORAL | Status: DC
  Administered 2015-06-04 – 2015-06-05 (×2): 100 mg via ORAL
  Filled 2015-06-04 (×3): qty 1

## 2015-06-04 MED ORDER — TRAMADOL HCL 50 MG PO TABS: 25.0000 mg | ORAL_TABLET | Freq: Three times a day (TID) | ORAL | Status: DC | PRN

## 2015-06-04 MED ORDER — OXYCODONE-ACETAMINOPHEN 5-325 MG PO TABS
1.0000 | ORAL_TABLET | ORAL | Status: DC | PRN
  Administered 2015-06-05 – 2015-06-10 (×9): 2 via ORAL
  Filled 2015-06-04 (×3): qty 2
  Filled 2015-06-04: qty 1
  Filled 2015-06-04 (×2): qty 2
  Filled 2015-06-04: qty 1
  Filled 2015-06-04 (×2): qty 2

## 2015-06-04 MED ORDER — PANTOPRAZOLE SODIUM 40 MG PO TBEC
40.0000 mg | DELAYED_RELEASE_TABLET | Freq: Every day | ORAL | Status: DC
  Administered 2015-06-04 – 2015-06-10 (×6): 40 mg via ORAL
  Filled 2015-06-04 (×6): qty 1

## 2015-06-04 MED ORDER — SIMVASTATIN 20 MG PO TABS
20.0000 mg | ORAL_TABLET | Freq: Every day | ORAL | Status: DC
  Administered 2015-06-04 – 2015-06-09 (×6): 20 mg via ORAL
  Filled 2015-06-04 (×6): qty 1

## 2015-06-04 MED ORDER — PIPERACILLIN-TAZOBACTAM 3.375 G IVPB
3.3750 g | Freq: Two times a day (BID) | INTRAVENOUS | Status: DC
  Administered 2015-06-04 – 2015-06-10 (×12): 3.375 g via INTRAVENOUS
  Filled 2015-06-04 (×15): qty 50

## 2015-06-04 MED ORDER — PROPRANOLOL HCL 20 MG PO TABS
40.0000 mg | ORAL_TABLET | Freq: Three times a day (TID) | ORAL | Status: DC
  Administered 2015-06-04 – 2015-06-10 (×16): 40 mg via ORAL
  Filled 2015-06-04 (×17): qty 2

## 2015-06-04 MED ORDER — SULFAMETHOXAZOLE-TRIMETHOPRIM 800-160 MG PO TABS
1.0000 | ORAL_TABLET | Freq: Two times a day (BID) | ORAL | Status: DC
  Administered 2015-06-04: 1 via ORAL
  Filled 2015-06-04: qty 1

## 2015-06-04 MED ORDER — ACETAMINOPHEN 325 MG PO TABS
650.0000 mg | ORAL_TABLET | ORAL | Status: DC | PRN
  Filled 2015-06-04: qty 2

## 2015-06-04 MED ORDER — ISOSORBIDE DINITRATE 20 MG PO TABS
30.0000 mg | ORAL_TABLET | Freq: Two times a day (BID) | ORAL | Status: DC
  Administered 2015-06-04 – 2015-06-10 (×11): 30 mg via ORAL
  Filled 2015-06-04 (×7): qty 2
  Filled 2015-06-04 (×2): qty 1.5
  Filled 2015-06-04: qty 2
  Filled 2015-06-04 (×2): qty 1
  Filled 2015-06-04 (×2): qty 2

## 2015-06-04 MED ORDER — CALCIUM CARBONATE-VITAMIN D 500-200 MG-UNIT PO TABS
1.0000 | ORAL_TABLET | Freq: Two times a day (BID) | ORAL | Status: DC
  Administered 2015-06-04 – 2015-06-10 (×11): 1 via ORAL
  Filled 2015-06-04 (×11): qty 1

## 2015-06-04 MED ORDER — FUROSEMIDE 40 MG PO TABS: 40.0000 mg | ORAL_TABLET | Freq: Two times a day (BID) | ORAL | Status: DC

## 2015-06-04 MED ORDER — CLONIDINE HCL 0.1 MG PO TABS
0.2000 mg | ORAL_TABLET | Freq: Three times a day (TID) | ORAL | Status: DC
  Administered 2015-06-04 – 2015-06-10 (×15): 0.2 mg via ORAL
  Filled 2015-06-04 (×16): qty 2

## 2015-06-04 MED ORDER — FERROUS SULFATE 325 (65 FE) MG PO TABS
325.0000 mg | ORAL_TABLET | Freq: Every day | ORAL | Status: DC
  Administered 2015-06-04 – 2015-06-10 (×5): 325 mg via ORAL
  Filled 2015-06-04 (×5): qty 1

## 2015-06-04 MED ORDER — VANCOMYCIN HCL 10 G IV SOLR
1250.0000 mg | Freq: Once | INTRAVENOUS | Status: AC
  Administered 2015-06-04: 1250 mg via INTRAVENOUS
  Filled 2015-06-04 (×2): qty 1250

## 2015-06-04 MED ORDER — MIRTAZAPINE 15 MG PO TABS
15.0000 mg | ORAL_TABLET | Freq: Every day | ORAL | Status: DC
  Administered 2015-06-04 – 2015-06-09 (×6): 15 mg via ORAL
  Filled 2015-06-04 (×6): qty 1

## 2015-06-04 MED ORDER — FOLIC ACID 1 MG PO TABS
1.0000 mg | ORAL_TABLET | Freq: Every day | ORAL | Status: DC
  Administered 2015-06-04 – 2015-06-10 (×5): 1 mg via ORAL
  Filled 2015-06-04 (×5): qty 1

## 2015-06-04 MED ORDER — POTASSIUM CHLORIDE CRYS ER 20 MEQ PO TBCR
20.0000 meq | EXTENDED_RELEASE_TABLET | Freq: Every day | ORAL | Status: DC
  Administered 2015-06-04: 20 meq via ORAL
  Filled 2015-06-04: qty 1

## 2015-06-04 MED ORDER — PIPERACILLIN-TAZOBACTAM 3.375 G IVPB: 3.3750 g | Freq: Two times a day (BID) | INTRAVENOUS | Status: DC

## 2015-06-04 MED ORDER — INSULIN ASPART 100 UNIT/ML ~~LOC~~ SOLN
0.0000 [IU] | Freq: Three times a day (TID) | SUBCUTANEOUS | Status: DC
  Administered 2015-06-04 – 2015-06-05 (×2): 2 [IU] via SUBCUTANEOUS
  Administered 2015-06-06 – 2015-06-08 (×2): 1 [IU] via SUBCUTANEOUS
  Administered 2015-06-08: 2 [IU] via SUBCUTANEOUS
  Administered 2015-06-09 (×3): 1 [IU] via SUBCUTANEOUS
  Administered 2015-06-10: 2 [IU] via SUBCUTANEOUS
  Filled 2015-06-04 (×5): qty 1
  Filled 2015-06-04: qty 2
  Filled 2015-06-04 (×3): qty 1
  Filled 2015-06-04: qty 2

## 2015-06-04 MED ORDER — RAMIPRIL 5 MG PO CAPS
5.0000 mg | ORAL_CAPSULE | Freq: Every day | ORAL | Status: DC
  Filled 2015-06-04: qty 1

## 2015-06-04 MED ORDER — HEPARIN SODIUM (PORCINE) 5000 UNIT/ML IJ SOLN
5000.0000 [IU] | Freq: Three times a day (TID) | INTRAMUSCULAR | Status: DC
  Administered 2015-06-04 – 2015-06-10 (×14): 5000 [IU] via SUBCUTANEOUS
  Filled 2015-06-04 (×14): qty 1

## 2015-06-04 MED ORDER — DOCUSATE SODIUM 100 MG PO CAPS
100.0000 mg | ORAL_CAPSULE | Freq: Two times a day (BID) | ORAL | Status: DC
  Administered 2015-06-04 – 2015-06-10 (×11): 100 mg via ORAL
  Filled 2015-06-04 (×11): qty 1

## 2015-06-04 MED ORDER — SODIUM POLYSTYRENE SULFONATE 15 GM/60ML PO SUSP
30.0000 g | Freq: Once | ORAL | Status: AC
  Administered 2015-06-04: 30 g via ORAL
  Filled 2015-06-04: qty 120

## 2015-06-04 MED ORDER — PIPERACILLIN-TAZOBACTAM 3.375 G IVPB
3.3750 g | Freq: Three times a day (TID) | INTRAVENOUS | Status: DC
  Administered 2015-06-04: 3.375 g via INTRAVENOUS
  Filled 2015-06-04 (×2): qty 50

## 2015-06-04 MED ORDER — SODIUM CHLORIDE 0.9 % IV SOLN
INTRAVENOUS | Status: DC
  Administered 2015-06-05 – 2015-06-07 (×3): via INTRAVENOUS

## 2015-06-04 MED ORDER — MINOCYCLINE HCL 100 MG PO CAPS
100.0000 mg | ORAL_CAPSULE | Freq: Two times a day (BID) | ORAL | Status: DC
  Filled 2015-06-04 (×2): qty 1

## 2015-06-04 MED ORDER — MELOXICAM 7.5 MG PO TABS
7.5000 mg | ORAL_TABLET | Freq: Every day | ORAL | Status: DC
  Filled 2015-06-04: qty 1

## 2015-06-04 MED ORDER — NITROGLYCERIN 0.4 MG SL SUBL: 0.4000 mg | SUBLINGUAL_TABLET | SUBLINGUAL | Status: DC | PRN

## 2015-06-04 NOTE — Progress Notes (Signed)
ANTIBIOTIC CONSULT NOTE - INITIAL  Pharmacy Consult for vancomycin and piperacillin/tazobactam Indication: Osteomyelitis  Allergies  Allergen Reactions  . Codeine Other (See Comments)    Reaction:  Hallucinations   Patient Measurements: Height: 5\' 7"  (170.2 cm) Weight: 159 lb 11.2 oz (72.439 kg) IBW/kg (Calculated) : 61.6 Adjusted Body Weight: n/a  Vital Signs: Temp: 96.7 F (35.9 C) (01/31 1516) Temp Source: Oral (01/31 1516) BP: 117/52 mmHg (01/31 1516) Pulse Rate: 67 (01/31 0948) Intake/Output from previous day:   Intake/Output from this shift:    Labs:  Recent Labs  06/04/15 1220  WBC 5.0  HGB 7.9*  PLT 136*  CREATININE 3.77*   Estimated Creatinine Clearance: 13.5 mL/min (by C-G formula based on Cr of 3.77). No results for input(s): VANCOTROUGH, VANCOPEAK, VANCORANDOM, GENTTROUGH, GENTPEAK, GENTRANDOM, TOBRATROUGH, TOBRAPEAK, TOBRARND, AMIKACINPEAK, AMIKACINTROU, AMIKACIN in the last 72 hours.   Assessment: Pharmacy consulted to dose vancomycin and piperacillin/tazobactam for osteomyelitis in this 70 year old female. Patient has a current CrCl of ~13 mL/min due to acute renal failure. Patient recently underwent a transmetatarsal amputation and was discharged with piperacillin/tazobactam and PO SMX/TMP to a nursing home last week. Patient returned to hospital due to possible gangrene at amputation site.   Use actual body weight of 72.4 kg  Kinetics: Ke: 0.016 Half-life: 43 hours Vd: 50.6 L    Goal of Therapy:  Vancomycin trough level 15-20 mcg/ml  Plan:  Measure antibiotic drug levels at steady state Follow up culture results  Piperacillin/tazobactam 3.375 g IV q 12 hours EI Vancomycin 1250 mg x 1 dose (~18 mg/kg). Will order random vancomycin level at 48 hours given ARF.  Pharmacy will closely follow renal function to determine further dosing  Thank you for allowing pharmacy to be a part of this patient's care.   Lenis Noon, PharmD Clinical  Pharmacist 06/04/2015,7:40 PM

## 2015-06-04 NOTE — Clinical Social Work Note (Signed)
Clinical Social Work Assessment  Patient Details  Name: [name] MRN: [id number] Date of Birth: [birth year]  Date of referral:  06/04/15               Reason for consult:  Facility Placement, Other (Comment Required) (From [facility] (SNF STR) )                Permission sought to share information with:  Chartered certified accountant granted to share information::  Yes, Verbal Permission Granted  Name::      [facility]   Agency::   [facility]   Relationship::     Contact Information:     Housing/Transportation Living arrangements for the past 2 months:  [place] of Information:  Patient, Spouse, Facility, Other (Comment Required) (Sisters ) Patient Interpreter Needed:  None Criminal Activity/Legal Involvement Pertinent to Current Situation/Hospitalization:  No - Comment as needed Significant Relationships:  Adult Children, Siblings, Spouse Lives with:  Spouse, Facility Resident Do you feel safe going back to the place where you live?  Yes Need for family participation in patient care:  Yes (Comment)  Care giving concerns:  Patient was discharged from [facility] on 05/30/15 to [facility] and was readmitted today (06/04/15) from [name] office.     Social Worker assessment / plan: Holiday representative (CSW) received verbal consult from RN that patient is from [facility]. Patient and family are familiar to CSW from last admission. Per [name] [facility] liaison patient can return to [facility] after hospitalization if new [facility] authorization is received. CSW left [name] case manager a voicemail making her aware of patient's admission. CSW met with patient and [name] and 2 sisters were at bedside. Patient was alert and oriented and was sitting up in the bed. Patient reported that she was sent to [facility] because [name] did not like the way her foot looked. Patient's sisters reported that patient may require more surgery. Patient  and husband are agreeable for patient to return to [facility] at discharge. Sisters spoke highly of [facility] and the care patient has received. Patient lives in [place] with her husband. Patient's son [name] is a strong support for patient and husband.   Employment status:  Retired Nurse, adult PT Recommendations:  Not assessed at this time Information / Referral to community resources:  [place]  Patient/Family's Response to care:  Patient is agreeable to returning to [facility].   Patient/Family's Understanding of and Emotional Response to Diagnosis, Current Treatment, and Prognosis:  Patient was pleasant throughout assessment and thanked CSW for visit.   Emotional Assessment Appearance:  Appears stated age Attitude/Demeanor/Rapport:    Affect (typically observed):  Accepting, Adaptable, Pleasant Orientation:  Oriented to Self, Oriented to  Time, Oriented to Place, Oriented to Situation, Fluctuating Orientation (Suspected and/or reported Sundowners) Alcohol / Substance use:  Not Applicable Psych involvement (Current and /or in the community):  No (Comment)  Discharge Needs  Concerns to be addressed:  Discharge Planning Concerns Readmission within the last 30 days:  Yes Current discharge risk:  Chronically ill Barriers to Discharge:  Continued Medical Work up   Krystal Fresh[place], LCSW 06/04/2015, 11:21 AM

## 2015-06-04 NOTE — Progress Notes (Signed)
Dr Vickki Muff on rounds. Pt with new breakdown on left great toe. md acknowledged

## 2015-06-04 NOTE — Progress Notes (Signed)
°   06/04/15 1600  Clinical Encounter Type  Visited With Patient and family together  Visit Type Follow-up  Referral From Nurse  Consult/Referral To Chaplain  Spiritual Encounters  Spiritual Needs Other (Comment)  AD documents completed and filed. [place] Ext [phone]

## 2015-06-04 NOTE — Progress Notes (Signed)
°   06/04/15 1500  Clinical Encounter Type  Visited With Patient and family together  Visit Type Follow-up  Referral From Nurse  Consult/Referral To Chaplain  Spiritual Encounters  Spiritual Needs Other (Comment)  Responded to page to complete AD documents.  Unable to locate a notary to finalize.    [place] Ext [phone]

## 2015-06-04 NOTE — H&P (Signed)
[facility] [place] NAME: [name]    MR#:  [id number]  DATE OF BIRTH:  07/02/1945  DATE OF ADMISSION:  06/04/2015  PRIMARY CARE PHYSICIAN: [facility]   REQUESTING/REFERRING PHYSICIAN: klein  CHIEF COMPLAINT:  No chief complaint on file.   HISTORY OF PRESENT ILLNESS: [name]  is a 70 y.o. female with a known history of hypertension, diabetes, coronary artery disease, CHF, hyperlipidemia, neuropathy, stroke, cardiomyopathy, carotid artery stenosis, chronic kidney disease, or stomatitis of the right foot and she had the transmetatarsal amputation and she was discharged with IV Zosyn and oral Bactrim last week to nursing home. Today she went to podiatry clinic on follow-up, and found to have more gangrenous part on her amputation area and not properly healing so she was sent in for direct admission to hospital for further management and suggested to vascular consult. Patient denies any complain.  PAST MEDICAL HISTORY:   Past Medical History  Diagnosis Date  . Hypertension   . Diabetes mellitus without complication ([place])   . Anemia   . Coronary artery disease   . CHF (congestive heart failure) ([place])   . Glaucoma   . Hyperlipidemia   . Neuropathy ([place])   . Chronic anemia   . Stroke ([place])   . Anginal pain ([place])   . Arthritis   . Retinopathy   . Cardiomyopathy ([place])   . Carotid artery stenosis   . Hoarseness, chronic   . GERD (gastroesophageal reflux disease)   . Hemangioma of liver   . Weight loss, unintentional   . DDD (degenerative disc disease), lumbar   . Osteomyelitis of foot, right, acute ([place])   . Chronic kidney disease     Stage 3    PAST SURGICAL HISTORY: Past Surgical History  Procedure Laterality Date  . Cardiac catheterization    . Esophagogastroduodenoscopy (egd) with propofol N/A 09/24/2014    Elliott-gastritis & normal Billroth II changes   . Colonoscopy with propofol N/A 09/24/2014     Elliott-Incomplete secondary to prep  . Coronary angioplasty  2016    stent placed  . Bilroth ii procedure    . Colonoscopy with propofol N/A 11/12/2014    Procedure: COLONOSCOPY WITH PROPOFOL;  Surgeon: Manya Silvas, MD;  Location: [facility] ENDOSCOPY;  Service: Endoscopy;  Laterality: N/A;  Trudee Kuster hole Right 12/06/2014    Procedure: Right burr hole  for subdural hematoma;  Surgeon: Newman Pies, MD;  Location: [facility] NEURO ORS;  Service: Neurosurgery;  Laterality: Right;  . Irrigation and debridement foot Right 02/01/2015    Procedure: IRRIGATION AND DEBRIDEMENT FOOT/RIGHT GREAT TOE;  Surgeon: [name], DPM;  Location: [facility] ORS;  Service: Podiatry;  Laterality: Right;  . Eye surgery Right     cataract removed  . Amputation toe Right 03/01/2015    Procedure: AMPUTATION  RIGHT GREAT TOE;  Surgeon: [name], DPM;  Location: [facility] ORS;  Service: Podiatry;  Laterality: Right;  . Irrigation and debridement foot Right 04/03/2015    Procedure: IRRIGATION AND DEBRIDEMENT FOOT;  Surgeon: Sharlotte Alamo, MD;  Location: [facility] ORS;  Service: Podiatry;  Laterality: Right;  . Peripheral vascular catheterization N/A 04/04/2015    Procedure: Abdominal Aortogram w/Lower Extremity;  Surgeon: Algernon Huxley, MD;  Location: [facility] CV LAB;  Service: Cardiovascular;  Laterality: N/A;  . Peripheral vascular catheterization  04/04/2015    Procedure: Lower Extremity Intervention;  Surgeon: Algernon Huxley, MD;  Location: [facility] INVASIVE CV  LAB;  Service: Cardiovascular;;  . Brain surgery    . Transmetatarsal amputation Right 05/28/2015    Procedure: TRANSMETATARSAL AMPUTATION;  Surgeon: Sharlotte Alamo, DPM;  Location: [facility] ORS;  Service: Podiatry;  Laterality: Right;    SOCIAL HISTORY:  Social History  Substance Use Topics  . Smoking status: Never Smoker   . Smokeless tobacco: Never Used     Comment: quit many years ago  . Alcohol Use: No    FAMILY HISTORY:  Family History  Problem Relation Age of Onset  .  Diabetes Mother   . Heart disease Father   . Diabetes Sister   . Lung cancer Brother   . Cancer Brother   . Diabetes Brother   . Diabetes Sister     DRUG ALLERGIES:  Allergies  Allergen Reactions  . Codeine Other (See Comments)    Reaction:  Hallucinations    REVIEW OF SYSTEMS:   CONSTITUTIONAL: No fever, fatigue or weakness.  EYES: No blurred or double vision.  EARS, NOSE, AND THROAT: No tinnitus or ear pain.  RESPIRATORY: No cough, shortness of breath, wheezing or hemoptysis.  CARDIOVASCULAR: No chest pain, orthopnea, edema.  GASTROINTESTINAL: No nausea, vomiting, diarrhea or abdominal pain.  GENITOURINARY: No dysuria, hematuria.  ENDOCRINE: No polyuria, nocturia,  HEMATOLOGY: No anemia, easy bruising or bleeding SKIN: No rash or lesion. MUSCULOSKELETAL: No joint pain or arthritis.  Right foot pain. NEUROLOGIC: No tingling, numbness, weakness.  PSYCHIATRY: No anxiety or depression.   MEDICATIONS AT HOME:  Prior to Admission medications   Medication Sig Start Date End Date Taking? Authorizing Provider  acetaminophen (TYLENOL) 325 MG tablet Take 2 tablets (650 mg total) by mouth every 4 (four) hours as needed for mild pain (or temp > 99 F). 12/13/14   Delfina Redwood, MD  calcium-vitamin D (OSCAL WITH D) 500-200 MG-UNIT tablet Take 1 tablet by mouth 2 (two) times daily. 05/30/15   Fritzi Mandes, MD  cloNIDine (CATAPRES) 0.2 MG tablet Take 1 tablet (0.2 mg total) by mouth 3 (three) times daily. 04/08/15   Demetrios Loll, MD  docusate sodium (COLACE) 100 MG capsule Take 1 capsule (100 mg total) by mouth 2 (two) times daily. 02/05/15   Lucilla Lame, MD  feeding supplement, GLUCERNA SHAKE, (GLUCERNA SHAKE) LIQD Take 237 mLs by mouth 2 (two) times daily between meals. 09/20/14   Loletha Grayer, MD  ferrous sulfate 325 (65 FE) MG tablet Take 325 mg by mouth daily. Reported on 06/04/2015    Historical Provider, MD  folic acid (FOLVITE) 1 MG tablet Take 1 tablet (1 mg total) by mouth daily.  02/22/15   Lequita Asal, MD  furosemide (LASIX) 40 MG tablet Take 1 tablet (40 mg total) by mouth 2 (two) times daily. 11/02/14   Alisa Graff, FNP  hydrALAZINE (APRESOLINE) 100 MG tablet Take 100 mg by mouth 3 (three) times daily.     Historical Provider, MD  insulin aspart (NOVOLOG) 100 UNIT/ML injection Inject 0-9 Units into the skin 3 (three) times daily with meals. 05/30/15   Fritzi Mandes, MD  isosorbide dinitrate (ISORDIL) 30 MG tablet Take 30 mg by mouth 2 (two) times daily. Reported on 05/27/2015    Historical Provider, MD  labetalol (NORMODYNE) 100 MG tablet Take 100 mg by mouth 2 (two) times daily. Reported on 05/27/2015    Historical Provider, MD  meloxicam (MOBIC) 7.5 MG tablet Take 7.5 mg by mouth daily. Reported on 05/27/2015    Historical Provider, MD  minocycline (MINOCIN,DYNACIN) 100 MG capsule  Take 1 capsule (100 mg total) by mouth 2 (two) times daily. 04/08/15   Demetrios Loll, MD  mirtazapine (REMERON) 15 MG tablet Take 15 mg by mouth at bedtime. Reported on 05/27/2015    Historical Provider, MD  nitroGLYCERIN (NITROSTAT) 0.4 MG SL tablet Place 0.4 mg under the tongue every 5 (five) minutes as needed for chest pain.    Historical Provider, MD  OMEGA-3 FATTY ACIDS PO Take 1 capsule by mouth daily. Reported on 05/27/2015    Historical Provider, MD  omeprazole (PRILOSEC) 20 MG capsule Take 20 mg by mouth daily.    Historical Provider, MD  oxyCODONE-acetaminophen (ROXICET) 5-325 MG tablet Take 1-2 tablets by mouth every 4 (four) hours as needed for severe pain. 05/29/15   Sharlotte Alamo, DPM  piperacillin-tazobactam (ZOSYN) 3.375 GM/50ML IVPB Inject 50 mLs (3.375 g total) into the vein every 8 (eight) hours. 05/30/15 06/13/15  Fritzi Mandes, MD  potassium chloride SA (K-DUR,KLOR-CON) 20 MEQ tablet Take 1 tablet (20 mEq total) by mouth daily. 11/02/14   Alisa Graff, FNP  propranolol (INDERAL) 20 MG tablet Take 40 mg by mouth 3 (three) times daily.    Historical Provider, MD  ramipril (ALTACE) 5 MG  capsule Take 5 mg by mouth daily.    Historical Provider, MD  simvastatin (ZOCOR) 20 MG tablet Take 20 mg by mouth at bedtime. Reported on 05/27/2015    Historical Provider, MD  sulfamethoxazole-trimethoprim (BACTRIM DS,SEPTRA DS) 800-160 MG tablet Take 1 tablet by mouth every 12 (twelve) hours. 05/30/15   Fritzi Mandes, MD      PHYSICAL EXAMINATION:   VITAL SIGNS: Blood pressure 117/52, pulse 67, temperature 96.7 F (35.9 C), temperature source Oral, resp. rate 18, height 5\' 7"  (1.702 m), weight 72.439 kg (159 lb 11.2 oz), SpO2 96 %.  GENERAL:  70 y.o.-year-old patient lying in the bed with no acute distress.  EYES: Pupils equal, round, reactive to light and accommodation. No scleral icterus. Extraocular muscles intact.  HEENT: Head atraumatic, normocephalic. Oropharynx and nasopharynx clear.  NECK:  Supple, no jugular venous distention. No thyroid enlargement, no tenderness.  LUNGS: Normal breath sounds bilaterally, no wheezing, rales,rhonchi or crepitation. No use of accessory muscles of respiration.  CARDIOVASCULAR: S1, S2 normal. No murmurs, rubs, or gallops.  ABDOMEN: Soft, nontender, nondistended. Bowel sounds present. No organomegaly or mass.  EXTREMITIES: No pedal edema, cyanosis, or clubbing. Right foot dressing present. Left foot swelling present. PICC line in place. NEUROLOGIC: Cranial nerves II through XII are intact. Muscle strength 3/5 in all extremities. Sensation intact. Gait not checked.  PSYCHIATRIC: The patient is alert and oriented x 3.  SKIN: No obvious rash, lesion, or ulcer.   LABORATORY PANEL:   CBC  Recent Labs Lab 05/29/15 0642 05/29/15 1733 05/30/15 0319 06/04/15 1220  WBC 5.3  --  6.5 5.0  HGB 7.5* 8.1* 7.8* 7.9*  HCT 23.2* 25.2* 24.7* 24.0*  PLT 155  --  139* 136*  MCV 88.2  --  89.3 88.1  MCH 28.5  --  28.3 28.9  MCHC 32.4  --  31.7* 32.8  RDW 19.2*  --  18.0* 17.2*    ------------------------------------------------------------------------------------------------------------------  Chemistries   Recent Labs Lab 05/29/15 0642 05/30/15 0319 06/04/15 1220  NA 135 140 136  K 3.7 4.1 5.3*  CL 112* 116* 111  CO2 18* 19* 16*  GLUCOSE 124* 86 216*  BUN 36* 41* 68*  CREATININE 1.80* 2.08* 3.77*  CALCIUM 7.1* 7.3* 7.7*   ------------------------------------------------------------------------------------------------------------------ estimated creatinine clearance is  13.5 mL/min (by C-G formula based on Cr of 3.77). ------------------------------------------------------------------------------------------------------------------ No results for input(s): TSH, T4TOTAL, T3FREE, THYROIDAB in the last 72 hours.  Invalid input(s): FREET3   Coagulation profile No results for input(s): INR, PROTIME in the last 168 hours. ------------------------------------------------------------------------------------------------------------------- No results for input(s): DDIMER in the last 72 hours. -------------------------------------------------------------------------------------------------------------------  Cardiac Enzymes No results for input(s): CKMB, TROPONINI, MYOGLOBIN in the last 168 hours.  Invalid input(s): CK ------------------------------------------------------------------------------------------------------------------ Invalid input(s): POCBNP  ---------------------------------------------------------------------------------------------------------------  Urinalysis    Component Value Date/Time   COLORURINE YELLOW 12/11/2014 0418   COLORURINE Yellow 05/05/2014 0324   APPEARANCEUR CLEAR 12/11/2014 0418   APPEARANCEUR Clear 05/05/2014 0324   LABSPEC 1.010 12/11/2014 0418   LABSPEC 1.021 05/05/2014 0324   PHURINE 5.5 12/11/2014 0418   PHURINE 5.0 05/05/2014 0324   GLUCOSEU NEGATIVE 12/11/2014 0418   GLUCOSEU Negative 05/05/2014 0324    HGBUR MODERATE* 12/11/2014 0418   HGBUR Negative 05/05/2014 0324   BILIRUBINUR NEGATIVE 12/11/2014 0418   BILIRUBINUR Negative 05/05/2014 [place] 12/11/2014 0418   KETONESUR Negative 05/05/2014 0324   PROTEINUR NEGATIVE 12/11/2014 0418   PROTEINUR 30 mg/dL 05/05/2014 0324   UROBILINOGEN 0.2 12/11/2014 0418   NITRITE NEGATIVE 12/11/2014 0418   NITRITE Negative 05/05/2014 0324   LEUKOCYTESUR NEGATIVE 12/11/2014 0418   LEUKOCYTESUR Trace 05/05/2014 0324     RADIOLOGY: No results found.  EKG: Orders placed or performed during the hospital encounter of 05/27/15  . EKG test  . EKG test  . EKG    IMPRESSION AND PLAN:  * Osteomalitis of the right foot   Status post transmetatarsal amputation  Was seen by infectious disease in last admission in suggested to continue Zosyn and Bactrim.  Vascular consult has her amputation looks gangrenous.  She will not need much antibiotics if there is a higher level amputation is done.  * Diabetes  Keep her on insulin sliding scale coverage for now.  * Hypertension  Continue clonidine, hold ramipril, Lasix due to renal failure.  * CHF  No acute exacerbation, continue Imdur.  * Acute on chronic renal failure   We'll hold Lasix, give some IV fluid, monitor tomorrow.  * Hyperkalemia  Give IV fluid, monitor tomorrow.    All the records are reviewed and case discussed with ED provider. Management plans discussed with the patient, family and they are in agreement.  CODE STATUS:    Code Status Orders        Start     Ordered   06/04/15 1010  Full code   Continuous     06/04/15 1010    Code Status History    Date Active Date Inactive Code Status Order ID Comments User Context   05/28/2015  2:56 PM 05/30/2015  7:58 PM Full Code [id number]  Sharlotte Alamo, DPM Inpatient   05/27/2015 11:34 AM 05/28/2015  2:56 PM Full Code [id number]  Fritzi Mandes, MD Inpatient   04/03/2015  7:22 PM 04/08/2015  9:33 PM Full Code [id number]  Sharlotte Alamo, MD Inpatient   04/02/2015 11:59 AM 04/03/2015  7:22 PM Full Code [id number]  Hillary Bow, MD Inpatient   03/01/2015  8:36 AM 03/01/2015  1:28 PM Full Code [id number]  [name], DPM Inpatient   01/29/2015  8:45 PM 02/02/2015  9:29 PM Full Code [id number]  Aldean Jewett, MD Inpatient   12/06/2014  5:15 PM 12/13/2014 10:23 PM Full Code [id number]  Newman Pies, MD Inpatient   12/03/2014  9:19 PM 12/06/2014  5:15 PM Full  Code [id number]  Alexis Goodell, MD Inpatient   10/03/2014  3:55 AM 10/09/2014  6:14 PM Full Code [id number]  Juluis Mire, MD Inpatient   09/17/2014  2:00 PM 09/20/2014  5:51 PM Full Code [id number]  Theodoro Grist, MD Inpatient       TOTAL TIME TAKING CARE OF THIS PATIENT: 50 minutes.  Multiple family members present in the room, discussed the plan with them.  Vaughan Basta M.D on 06/04/2015   Between 7am to 6pm - Pager - [phone]  After 6pm go to [email or web address] - password [email or web address] [facility]  Office  [phone]  CC: Primary care physician; [facility]   Note: This dictation was prepared with Dragon dictation along with smaller phrase technology. Any transcriptional errors that result from this process are unintentional.

## 2015-06-04 NOTE — Progress Notes (Signed)
Presented Adv. Directive information to son and he advised he would cover his mom. When decision is  Made and forms filled out will have nurse contact [place].

## 2015-06-05 DIAGNOSIS — L89159 Pressure ulcer of sacral region, unspecified stage: Secondary | ICD-10-CM | POA: Diagnosis present

## 2015-06-05 LAB — BASIC METABOLIC PANEL
ANION GAP: 5 (ref 5–15)
BUN: 66 mg/dL — AB (ref 6–20)
CHLORIDE: 115 mmol/L — AB (ref 101–111)
CO2: 19 mmol/L — ABNORMAL LOW (ref 22–32)
Calcium: 7.5 mg/dL — ABNORMAL LOW (ref 8.9–10.3)
Creatinine, Ser: 3.65 mg/dL — ABNORMAL HIGH (ref 0.44–1.00)
GFR calc Af Amer: 14 mL/min — ABNORMAL LOW (ref >60)
GFR, EST NON AFRICAN AMERICAN: 12 mL/min — AB (ref >60)
GLUCOSE: 131 mg/dL — AB (ref 65–99)
POTASSIUM: 4.8 mmol/L (ref 3.5–5.1)
Sodium: 139 mmol/L (ref 135–145)

## 2015-06-05 LAB — CBC
HEMATOCRIT: 23.9 % — AB (ref 35.0–47.0)
Hemoglobin: 7.7 g/dL — ABNORMAL LOW (ref 12.0–16.0)
MCH: 28.8 pg (ref 26.0–34.0)
MCHC: 32.3 g/dL (ref 32.0–36.0)
MCV: 89.2 fL (ref 80.0–100.0)
Platelets: 138 10*3/uL — ABNORMAL LOW (ref 150–440)
RBC: 2.68 MIL/uL — ABNORMAL LOW (ref 3.80–5.20)
RDW: 17.6 % — AB (ref 11.5–14.5)
WBC: 4.3 10*3/uL (ref 3.6–11.0)

## 2015-06-05 LAB — GLUCOSE, CAPILLARY
GLUCOSE-CAPILLARY: 165 mg/dL — AB (ref 65–99)
GLUCOSE-CAPILLARY: 95 mg/dL (ref 65–99)
Glucose-Capillary: 104 mg/dL — ABNORMAL HIGH (ref 65–99)

## 2015-06-05 MED ORDER — AMLODIPINE BESYLATE 5 MG PO TABS
2.5000 mg | ORAL_TABLET | Freq: Every day | ORAL | Status: DC
  Administered 2015-06-05 – 2015-06-08 (×3): 2.5 mg via ORAL
  Filled 2015-06-05 (×3): qty 1

## 2015-06-05 MED ORDER — VANCOMYCIN HCL IN DEXTROSE 1-5 GM/200ML-% IV SOLN
1000.0000 mg | INTRAVENOUS | Status: DC
  Filled 2015-06-05: qty 200

## 2015-06-05 NOTE — Progress Notes (Signed)
ANTIBIOTIC CONSULT NOTE - Follow Up  Pharmacy Consult for vancomycin and piperacillin/tazobactam Indication: Osteomyelitis  Allergies  Allergen Reactions  . Codeine Other (See Comments)    Reaction:  Hallucinations   Patient Measurements: Height: 5\' 7"  (170.2 cm) Weight: 159 lb 11.2 oz (72.439 kg) IBW/kg (Calculated) : 61.6 Adjusted Body Weight: n/a  Vital Signs: Temp: 98.7 F (37.1 C) (02/01 0758) Temp Source: Oral (02/01 0758) BP: 145/59 mmHg (02/01 0932) Pulse Rate: 64 (02/01 0758) Intake/Output from previous day: 01/31 0701 - 02/01 0700 In: 1665 [P.O.:240; I.V.:1125; IV Piggyback:300] Out: 1000 [Urine:1000] Intake/Output from this shift: Total I/O In: 723.3 [I.V.:723.3] Out: 500 [Urine:500]  Labs:  Recent Labs  06/04/15 1220 06/05/15 0600  WBC 5.0 4.3  HGB 7.9* 7.7*  PLT 136* 138*  CREATININE 3.77* 3.65*   Estimated Creatinine Clearance: 13.9 mL/min (by C-G formula based on Cr of 3.65). No results for input(s): VANCOTROUGH, VANCOPEAK, VANCORANDOM, GENTTROUGH, GENTPEAK, GENTRANDOM, TOBRATROUGH, TOBRAPEAK, TOBRARND, AMIKACINPEAK, AMIKACINTROU, AMIKACIN in the last 72 hours.   Assessment: Pharmacy consulted to dose vancomycin and piperacillin/tazobactam for osteomyelitis in this 70 year old female. Patient has a current CrCl of ~13.9 mL/min due to acute renal failure. Patient recently underwent a transmetatarsal amputation and was discharged with piperacillin/tazobactam and PO SMX/TMP to a nursing home last week. Patient returned to hospital due to possible gangrene at amputation site. ID consulted.  Use actual body weight of 72.4 kg  Kinetics: Ke: 0.016 Half-life: 43 hours Vd: 50.6 L    Goal of Therapy:  Vancomycin trough level 15-20 mcg/ml  Plan:   1. Continue Zosyn 3.375 gm IV q12h per EI protocol based on renal function.  2. Patient received Vancomycin 1250 mg IV once at 2130 on 1/31.  Will order Vancomycin 1000 mg IV q48h based on kinetic parameters.   Trough ordered prior to next dose on 2/2 at 2000 to help guide therapy as patient is in acute renal failure.   Thank you for allowing pharmacy to be a part of this patient's care.   Loleta Dicker, PharmD Clinical Pharmacist 06/05/2015,12:00 PM

## 2015-06-05 NOTE — Progress Notes (Signed)
[place] [facility] [place] NAME: [name]    MR#:  [id number]  DATE OF BIRTH:  05-21-1945  SUBJECTIVE:  CHIEF COMPLAINT:  No chief complaint on file.  Waiting for vascular eval. Afebrile No SOB/CP/Abd pain  REVIEW OF SYSTEMS:    Review of Systems  Constitutional: Negative for fever and chills.  HENT: Negative for sore throat.   Eyes: Negative for blurred vision, double vision and pain.  Respiratory: Negative for cough, hemoptysis, shortness of breath and wheezing.   Cardiovascular: Negative for chest pain, palpitations, orthopnea and leg swelling.  Gastrointestinal: Negative for heartburn, nausea, vomiting, abdominal pain, diarrhea and constipation.  Genitourinary: Negative for dysuria and hematuria.  Musculoskeletal: Negative for back pain and joint pain.  Skin: Negative for rash.  Neurological: Negative for sensory change, speech change, focal weakness and headaches.  Endo/Heme/Allergies: Does not bruise/bleed easily.  Psychiatric/Behavioral: Negative for depression. The patient is not nervous/anxious.       DRUG ALLERGIES:   Allergies  Allergen Reactions  . Codeine Other (See Comments)    Reaction:  Hallucinations    VITALS:  Blood pressure 145/59, pulse 64, temperature 98.7 F (37.1 C), temperature source Oral, resp. rate 18, height 5\' 7"  (1.702 m), weight 72.439 kg (159 lb 11.2 oz), SpO2 97 %.  PHYSICAL EXAMINATION:   Physical Exam  GENERAL:  70 y.o.-year-old patient lying in the bed with no acute distress.  EYES: Pupils equal, round, reactive to light and accommodation. No scleral icterus. Extraocular muscles intact.  HEENT: Head atraumatic, normocephalic. Oropharynx and nasopharynx clear.  NECK:  Supple, no jugular venous distention. No thyroid enlargement, no tenderness.  LUNGS: Normal breath sounds bilaterally, no wheezing, rales, rhonchi. No use of accessory muscles of respiration.  CARDIOVASCULAR: S1,  S2 normal. No murmurs, rubs, or gallops.  ABDOMEN: Soft, nontender, nondistended. Bowel sounds present. No organomegaly or mass.  EXTREMITIES: No cyanosis, clubbing or edema b/l.    NEUROLOGIC: Cranial nerves II through XII are intact. No focal Motor or sensory deficits b/l.   PSYCHIATRIC: The patient is alert and oriented x 3.  SKIN: No obvious rash.  Sacral pressure ulcer  Right foot dressing. Not opened.   LABORATORY PANEL:   CBC  Recent Labs Lab 06/05/15 0600  WBC 4.3  HGB 7.7*  HCT 23.9*  PLT 138*   ------------------------------------------------------------------------------------------------------------------  Chemistries   Recent Labs Lab 06/05/15 0600  NA 139  K 4.8  CL 115*  CO2 19*  GLUCOSE 131*  BUN 66*  CREATININE 3.65*  CALCIUM 7.5*   ------------------------------------------------------------------------------------------------------------------  Cardiac Enzymes No results for input(s): TROPONINI in the last 168 hours. ------------------------------------------------------------------------------------------------------------------  RADIOLOGY:  No results found.   ASSESSMENT AND PLAN:   * Osteomyelitis of the right foot  Status post transmetatarsal amputation. On IV Zosyn and vancomycin Vascular consult for possible amputation ID consulted for abx recommendations  * Diabetes SSI  * Hypertension Continue clonidine. Hold ramipril and Lasix due to renal failure.  * Anemia of chronic disease - stable  * CHF No acute exacerbation, continue Imdur.  * Acute renal failure over CKD 3 Baseline Cr around 1.7 Hold lasix Start IVF. I/Os. Daily weight. Repeat in AM.  * Hyperkalemia resolved with IVF   All the records are reviewed and case discussed with Care Management/Social Workerr. Management plans discussed with the patient, family and they are in agreement.  CODE STATUS: FULL  DVT Prophylaxis: SCDs  TOTAL TIME  TAKING CARE OF THIS PATIENT: 35  minutes.   POSSIBLE D/C IN 2-3 DAYS, DEPENDING ON CLINICAL CONDITION.   Hillary Bow R M.D on 06/05/2015 at 11:26 AM  Between 7am to 6pm - Pager - [phone]  After 6pm go to [email or web address] - password [email or web address] [facility]  Office  [phone]  CC: Primary care physician; [facility]    Note: This dictation was prepared with Dragon dictation along with smaller phrase technology. Any transcriptional errors that result from this process are unintentional.

## 2015-06-05 NOTE — Consult Note (Signed)
[facility] Consult Note  MRN : [id number]  [name] is a 70 y.o. (1945-06-28) female who presents with chief complaint of No chief complaint on file. Marland Kitchen  History of Present Illness: I am asked to see the patient by the hospitalist service for evaluation of her nonhealing right transmetatarsal amputation. About 2 months ago, we performed an angiogram at which time tibial disease was seen. She had a peroneal artery angioplasty and this was her only runoff distally. After that, she has had 3 separate surgeries all of which have resulted in wound dehiscence or infection and she most recently had a transmetatarsal amputation performed by podiatry. Her skin edges become gangrenous and she now is at the mid foot with purulent material draining from her wound. At this point, with the very high expectation of limb loss we are consult for further evaluation both of her vascular status and consideration for major amputation. She has mild pain. She had some low-grade fevers but this has improved with antibiotics. She reports no trauma or injury to the area. She has no ulceration or wounds on the left leg at this time.  Current Facility-Administered Medications  Medication Dose Route Frequency Provider Last Rate Last Dose  . 0.9 %  sodium chloride infusion   Intravenous Continuous Vaughan Basta, MD 100 mL/hr at 06/05/15 1110    . acetaminophen (TYLENOL) tablet 650 mg  650 mg Oral Q4H PRN Vaughan Basta, MD      . amLODipine (NORVASC) tablet 2.5 mg  2.5 mg Oral Daily Srikar Sudini, MD   2.5 mg at 06/05/15 1522  . calcium-vitamin D (OSCAL WITH D) 500-200 MG-UNIT per tablet 1 tablet  1 tablet Oral BID Vaughan Basta, MD   1 tablet at 06/05/15 0934  . cloNIDine (CATAPRES) tablet 0.2 mg  0.2 mg Oral TID Vaughan Basta, MD   0.2 mg at 06/05/15 1522  . docusate sodium (COLACE) capsule 100 mg  100 mg Oral BID Vaughan Basta, MD   100 mg at  06/05/15 0934  . ferrous sulfate tablet 325 mg  325 mg Oral Daily Vaughan Basta, MD   325 mg at 06/05/15 0935  . folic acid (FOLVITE) tablet 1 mg  1 mg Oral Daily Vaughan Basta, MD   1 mg at 06/05/15 0934  . heparin injection 5,000 Units  5,000 Units Subcutaneous 3 times per day Vaughan Basta, MD   5,000 Units at 06/05/15 1356  . insulin aspart (novoLOG) injection 0-9 Units  0-9 Units Subcutaneous TID WC Vaughan Basta, MD   2 Units at 06/05/15 1137  . isosorbide dinitrate (ISORDIL) tablet 30 mg  30 mg Oral BID Vaughan Basta, MD   30 mg at 06/05/15 0934  . mirtazapine (REMERON) tablet 15 mg  15 mg Oral QHS Vaughan Basta, MD   15 mg at 06/04/15 2127  . nitroGLYCERIN (NITROSTAT) SL tablet 0.4 mg  0.4 mg Sublingual Q5 min PRN Vaughan Basta, MD      . oxyCODONE-acetaminophen (PERCOCET/ROXICET) 5-325 MG per tablet 1-2 tablet  1-2 tablet Oral Q4H PRN Vaughan Basta, MD   2 tablet at 06/05/15 1110  . pantoprazole (PROTONIX) EC tablet 40 mg  40 mg Oral Daily Vaughan Basta, MD   40 mg at 06/05/15 0935  . piperacillin-tazobactam (ZOSYN) IVPB 3.375 g  3.375 g Intravenous Q12H Crystal G Scarpena, RPH   3.375 g at 06/05/15 1036  . propranolol (INDERAL) tablet 40 mg  40 mg Oral TID Vaughan Basta, MD  40 mg at 06/05/15 1521  . simvastatin (ZOCOR) tablet 20 mg  20 mg Oral QHS Vaughan Basta, MD   20 mg at 06/04/15 2127  . traMADol (ULTRAM) tablet 25-50 mg  25-50 mg Oral TID PRN Vaughan Basta, MD      . Derrill Memo ON 06/06/2015] vancomycin (VANCOCIN) IVPB 1000 mg/200 mL premix  1,000 mg Intravenous Q48H [name], [facility]        Past Medical History  Diagnosis Date  . Hypertension   . Diabetes mellitus without complication ([place])   . Anemia   . Coronary artery disease   . CHF (congestive heart failure) ([place])   . Glaucoma   . Hyperlipidemia   . Neuropathy ([place])   . Chronic anemia   . Stroke ([place])   . Anginal pain  ([place])   . Arthritis   . Retinopathy   . Cardiomyopathy ([place])   . Carotid artery stenosis   . Hoarseness, chronic   . GERD (gastroesophageal reflux disease)   . Hemangioma of liver   . Weight loss, unintentional   . DDD (degenerative disc disease), lumbar   . Osteomyelitis of foot, right, acute ([place])   . Chronic kidney disease     Stage 3    Past Surgical History  Procedure Laterality Date  . Cardiac catheterization    . Esophagogastroduodenoscopy (egd) with propofol N/A 09/24/2014    Elliott-gastritis & normal Billroth II changes   . Colonoscopy with propofol N/A 09/24/2014    Elliott-Incomplete secondary to prep  . Coronary angioplasty  2016    stent placed  . Bilroth ii procedure    . Colonoscopy with propofol N/A 11/12/2014    Procedure: COLONOSCOPY WITH PROPOFOL;  Surgeon: Manya Silvas, MD;  Location: [facility] END[facility]COPY;  Service: Endoscopy;  Laterality: N/A;  Trudee Kuster hole Right 12/06/2014    Procedure: Right burr hole  for subdural hematoma;  Surgeon: Newman Pies, MD;  Location: [facility] NEURO ORS;  Service: Neurosurgery;  Laterality: Right;  . Irrigation and debridement foot Right 02/01/2015    Procedure: IRRIGATION AND DEBRIDEMENT FOOT/RIGHT GREAT TOE;  Surgeon: [name], DPM;  Location: [facility] ORS;  Service: Podiatry;  Laterality: Right;  . Eye surgery Right     cataract removed  . Amputation toe Right 03/01/2015    Procedure: AMPUTATION  RIGHT GREAT TOE;  Surgeon: [name], DPM;  Location: [facility] ORS;  Service: Podiatry;  Laterality: Right;  . Irrigation and debridement foot Right 04/03/2015    Procedure: IRRIGATION AND DEBRIDEMENT FOOT;  Surgeon: Sharlotte Alamo, MD;  Location: [facility] ORS;  Service: Podiatry;  Laterality: Right;  . Peripheral vascular catheterization N/A 04/04/2015    Procedure: Abdominal Aortogram w/Lower Extremity;  Surgeon: Algernon Huxley, MD;  Location: [place] CV LAB;  Service: Cardiovascular;  Laterality: N/A;  . Peripheral vascular catheterization   04/04/2015    Procedure: Lower Extremity Intervention;  Surgeon: Algernon Huxley, MD;  Location: [place] CV LAB;  Service: Cardiovascular;;  . Brain surgery    . Transmetatarsal amputation Right 05/28/2015    Procedure: TRANSMETATARSAL AMPUTATION;  Surgeon: Sharlotte Alamo, DPM;  Location: [facility] ORS;  Service: Podiatry;  Laterality: Right;    Social History Social History  Substance Use Topics  . Smoking status: Never Smoker   . Smokeless tobacco: Never Used     Comment: quit many years ago  . Alcohol Use: No  No IVDU  Family History Family History  Problem Relation Age of Onset  . Diabetes Mother   .  Heart disease Father   . Diabetes Sister   . Lung cancer Brother   . Cancer Brother   . Diabetes Brother   . Diabetes Sister   No bleeding or clotting disorders  Allergies  Allergen Reactions  . Codeine Other (See Comments)    Reaction:  Hallucinations     REVIEW OF SYSTEMS (Negative unless checked)  Constitutional: [] Weight loss  [x] Fever  [] Chills Cardiac: [] Chest pain   [] Chest pressure   [] Palpitations   [] Shortness of breath when laying flat   [] Shortness of breath at rest   [] Shortness of breath with exertion. Vascular:  [] Pain in legs with walking   [] Pain in legs at rest   [] Pain in legs when laying flat   [] Claudication   [] Pain in feet when walking  [x] Pain in feet at rest  [] Pain in feet when laying flat   [] History of DVT   [] Phlebitis   [] Swelling in legs   [] Varicose veins   [x] Non-healing ulcers Pulmonary:   [] Uses home oxygen   [] Productive cough   [] Hemoptysis   [] Wheeze  [] COPD   [] Asthma Neurologic:  [] Dizziness  [] Blackouts   [] Seizures   [x] History of stroke   [] History of TIA  [] Aphasia   [] Temporary blindness   [] Dysphagia   [] Weakness or numbness in arms   [] Weakness or numbness in legs Musculoskeletal:  [] Arthritis   [] Joint swelling   [] Joint pain   [] Low back pain Hematologic:  [] Easy bruising  [] Easy bleeding   [] Hypercoagulable state   [] Anemic   [] Hepatitis Gastrointestinal:  [] Blood in stool   [] Vomiting blood  [] Gastroesophageal reflux/heartburn   [] Difficulty swallowing. Genitourinary:  [] Chronic kidney disease   [] Difficult urination  [] Frequent urination  [] Burning with urination   [] Blood in urine Skin:  [] Rashes   [x] Ulcers   [x] Wounds Psychological:  [] History of anxiety   []  History of major depression.  Physical Examination  Filed Vitals:   06/05/15 0758 06/05/15 0932 06/05/15 1354 06/05/15 1521  BP: 128/54 145/59 148/58   Pulse: 64   64  Temp: 98.7 F (37.1 C)     TempSrc: Oral     Resp: 18     Height:      Weight:      SpO2: 97%      Body mass index is 25.01 kg/(m^2). Gen:  WD/WN, NAD Head: [place]/AT, No temporalis wasting. Prominent temp pulse not noted. Ear/Nose/Throat: Hearing grossly intact, nares w/o erythema or drainage, oropharynx w/o Erythema/Exudate Eyes: PERRLA, EOMI.  Neck: Supple, no nuchal rigidity.  No JVD.  Pulmonary:  Good air movement, equal bilaterally.  Cardiac: RRR, normal S1, S2, no Murmurs, rubs or gallops. Vascular:  Vessel Right Left  Radial Palpable Palpable  Ulnar Palpable Palpable  Brachial Palpable Palpable  Carotid Palpable, without bruit Palpable, without bruit  Aorta Not palpable N/A  Femoral Palpable Palpable  Popliteal Palpable Palpable  PT Not Palpable Palpable  DP 1+, Palpable Not Palpable   Gastrointestinal: soft, non-tender/non-distended. No guarding/reflex. No masses, surgical incisions, or scars. Musculoskeletal: Transmetatarsal amputation healing poorly as below. Infection clearly present. Mild to moderate lower extremity swelling is present. Neurologic: CN 2-12 intact. Pain and light touch intact in extremities.  Symmetrical.  Speech is fluent. Motor exam as listed above. Psychiatric: Judgment intact, Mood & affect appropriate for pt's clinical situation. Dermatologic: Open wound at transmetatarsal amputation site with gangrenous skin edges and purulent material  freely draining with minimal palpation. Lymph : No Cervical, Axillary, or Inguinal lymphadenopathy.   CBC Lab Results  Component Value Date   WBC 4.3 06/05/2015   HGB 7.7* 06/05/2015   HCT 23.9* 06/05/2015   MCV 89.2 06/05/2015   PLT 138* 06/05/2015    BMET    Component Value Date/Time   NA 139 06/05/2015 0600   NA 146* 05/05/2014 0108   K 4.8 06/05/2015 0600   K 3.4* 05/05/2014 0108   CL 115* 06/05/2015 0600   CL 118* 05/05/2014 0108   CO2 19* 06/05/2015 0600   CO2 21 05/05/2014 0108   GLUCOSE 131* 06/05/2015 0600   GLUCOSE 52* 05/05/2014 0108   BUN 66* 06/05/2015 0600   BUN 26* 05/05/2014 0108   CREATININE 3.65* 06/05/2015 0600   CREATININE 2.02* 05/05/2014 0108   CALCIUM 7.5* 06/05/2015 0600   CALCIUM 7.7* 05/05/2014 0108   GFRNONAA 12* 06/05/2015 0600   GFRNONAA 26* 05/05/2014 0108   GFRNONAA 28* 05/22/2013 0518   GFRAA 14* 06/05/2015 0600   GFRAA 31* 05/05/2014 0108   GFRAA 33* 05/22/2013 0518   Estimated Creatinine Clearance: 13.9 mL/min (by C-G formula based on Cr of 3.65).  COAG Lab Results  Component Value Date   INR 1.18 05/27/2015   INR 1.33 04/02/2015   INR 1.27 12/03/2014    Radiology Dg Chest Port 1 View  05/29/2015  CLINICAL DATA:  PICC line placement. Recently postop for metatarsal amputation. EXAM: PORTABLE CHEST 1 VIEW COMPARISON:  04/05/2015 FINDINGS: A new right arm PICC line is seen with tip overlying the inferior aspect of the right atrium, approximately 5 cm below the superior cavoatrial junction. No pneumothorax visualized Decreased left pleural effusion and left basilar atelectasis is demonstrated. Increased atelectasis seen in the right lung base. Heart size remains stable IMPRESSION: New right arm PICC line tip overlies the inferior aspect of the right atrium, approximately 5 cm below the superior cavoatrial junction. Decreased left pleural effusion and left lower lobe atelectasis. Increased Right basilar atelectasis. Electronically  Signed   By: Earle Gell M.D.   On: 05/29/2015 12:49     Assessment/Plan 1. Peripheral arterial disease with ulceration, infection, and gangrene of the right foot. Has had previous intervention and had one-vessel runoff distally. Despite this, she has had 3 surgeries which have failed to heal and she has a transmetatarsal amputation with infection and gangrene around her wound. At this point, I do not think she has enough foot to salvage any sort of meaningful foot amputation and I think the likelihood of healing in the foot is reasonably low I think she would be best served by proceeding with right below-knee amputation, and the patient comes to that decision today to try to get her on the schedule tomorrow or Friday. If she is adamant that she would like to exhaust all options for attempted limb salvage, I would perform an angiogram tomorrow for further evaluation. I have discussed the case with Dr. Vickki Muff from podiatry and his inclination is that she should proceed with a below-knee amputation as well. The patient does not seem to grasp that and my first evaluation in talking to her. 2. Transmetatarsal amputation with infection and poorly healing wound. See above. I believe she should proceed with a below-knee amputation. 3. History of stroke. No recent symptoms. 4. Diabetes. In pairs wound healing. Glucose control very important. 5. Multiple other medical comorbidities listed above being managed by the primary service.   DEW,JASON, MD  06/05/2015 4:11 PM

## 2015-06-05 NOTE — Consult Note (Signed)
ORTHOPAEDIC CONSULTATION  REQUESTING PHYSICIAN: Hillary Bow, MD  Chief Complaint: nonhealing right foot   HPI: [name] is a 70 y.o. female who complains of  Nonhealing amputation site.Underwnt TMA right foot last week for gangrene and seen in outpt clinic yesterday with marked worsening of amp site.  Vasc surgery has attempted revasc with minimal results approx 2 months ago.    Past Medical History  Diagnosis Date  . Hypertension   . Diabetes mellitus without complication ([place])   . Anemia   . Coronary artery disease   . CHF (congestive heart failure) ([place])   . Glaucoma   . Hyperlipidemia   . Neuropathy ([place])   . Chronic anemia   . Stroke ([place])   . Anginal pain ([place])   . Arthritis   . Retinopathy   . Cardiomyopathy ([place])   . Carotid artery stenosis   . Hoarseness, chronic   . GERD (gastroesophageal reflux disease)   . Hemangioma of liver   . Weight loss, unintentional   . DDD (degenerative disc disease), lumbar   . Osteomyelitis of foot, right, acute ([place])   . Chronic kidney disease     Stage 3   Past Surgical History  Procedure Laterality Date  . Cardiac catheterization    . Esophagogastroduodenoscopy (egd) with propofol N/A 09/24/2014    Elliott-gastritis & normal Billroth II changes   . Colonoscopy with propofol N/A 09/24/2014    Elliott-Incomplete secondary to prep  . Coronary angioplasty  2016    stent placed  . Bilroth ii procedure    . Colonoscopy with propofol N/A 11/12/2014    Procedure: COLONOSCOPY WITH PROPOFOL;  Surgeon: Manya Silvas, MD;  Location[facility] ENDOSCOPY;  Service: Endoscopy;  Laterality: N/A;  Trudee Kuster hole Right 12/06/2014    Procedure: Right burr hole  for subdural hematoma;  Surgeon: Newman Pies, MD;  Location[facility] NEURO ORS;  Service: Neurosurgery;  Laterality: Right;  . Irrigation and debridement foot Right 02/01/2015    Procedure: IRRIGATION AND DEBRIDEMENT FOOT/RIGHT GREAT TOE;  Surgeon: [name], DPM;  Location: [facility] ORS;   Service: Podiatry;  Laterality: Right;  . Eye surgery Right     cataract removed  . Amputation toe Right 03/01/2015    Procedure: AMPUTATION  RIGHT GREAT TOE;  Surgeon: [name], DPM;  Location: [facility] ORS;  Service: Podiatry;  Laterality: Right;  . Irrigation and debridement foot Right 04/03/2015    Procedure: IRRIGATION AND DEBRIDEMENT FOOT;  Surgeon: Sharlotte Alamo, MD;  Location: [facility] ORS;  Service: Podiatry;  Laterality: Right;  . Peripheral vascular catheterization N/A 04/04/2015    Procedure: Abdominal Aortogram w/Lower Extremity;  Surgeon: Algernon Huxley, MD;  Location: [place] CV LAB;  Service: Cardiovascular;  Laterality: N/A;  . Peripheral vascular catheterization  04/04/2015    Procedure: Lower Extremity Intervention;  Surgeon: Algernon Huxley, MD;  Location: [place] CV LAB;  Service: Cardiovascular;;  . Brain surgery    . Transmetatarsal amputation Right 05/28/2015    Procedure: TRANSMETATARSAL AMPUTATION;  Surgeon: Sharlotte Alamo, DPM;  Location: [facility] ORS;  Service: Podiatry;  Laterality: Right;   Social History   Social History  . Marital Status: Married    Spouse Name: N/A  . Number of Children: 2  . Years of Education: N/A   Occupational History  . retired Actuary     Social History Main Topics  . Smoking status: Never Smoker   . Smokeless tobacco: Never Used     Comment: quit many  years ago  . Alcohol Use: No  . Drug Use: No  . Sexual Activity: Yes    Birth Control/ Protection: Post-menopausal   Other Topics Concern  . None   Social History Narrative   Lives with her husband   Family History  Problem Relation Age of Onset  . Diabetes Mother   . Heart disease Father   . Diabetes Sister   . Lung cancer Brother   . Cancer Brother   . Diabetes Brother   . Diabetes Sister    Allergies  Allergen Reactions  . Codeine Other (See Comments)    Reaction:  Hallucinations   Prior to Admission medications   Medication Sig Start Date End Date Taking?  Authorizing Provider  acetaminophen (TYLENOL) 325 MG tablet Take 2 tablets (650 mg total) by mouth every 4 (four) hours as needed for mild pain (or temp > 99 F). 12/13/14   Delfina Redwood, MD  calcium-vitamin D (OSCAL WITH D) 500-200 MG-UNIT tablet Take 1 tablet by mouth 2 (two) times daily. 05/30/15   Fritzi Mandes, MD  cloNIDine (CATAPRES) 0.2 MG tablet Take 1 tablet (0.2 mg total) by mouth 3 (three) times daily. 04/08/15   Demetrios Loll, MD  docusate sodium (COLACE) 100 MG capsule Take 1 capsule (100 mg total) by mouth 2 (two) times daily. 02/05/15   Lucilla Lame, MD  feeding supplement, GLUCERNA SHAKE, (GLUCERNA SHAKE) LIQD Take 237 mLs by mouth 2 (two) times daily between meals. 09/20/14   Loletha Grayer, MD  ferrous sulfate 325 (65 FE) MG tablet Take 325 mg by mouth daily. Reported on 06/04/2015    Historical Provider, MD  folic acid (FOLVITE) 1 MG tablet Take 1 tablet (1 mg total) by mouth daily. 02/22/15   Lequita Asal, MD  furosemide (LASIX) 40 MG tablet Take 1 tablet (40 mg total) by mouth 2 (two) times daily. 11/02/14   Alisa Graff, FNP  hydrALAZINE (APRESOLINE) 100 MG tablet Take 100 mg by mouth 3 (three) times daily.     Historical Provider, MD  insulin aspart (NOVOLOG) 100 UNIT/ML injection Inject 0-9 Units into the skin 3 (three) times daily with meals. 05/30/15   Fritzi Mandes, MD  isosorbide dinitrate (ISORDIL) 30 MG tablet Take 30 mg by mouth 2 (two) times daily. Reported on 05/27/2015    Historical Provider, MD  labetalol (NORMODYNE) 100 MG tablet Take 100 mg by mouth 2 (two) times daily. Reported on 05/27/2015    Historical Provider, MD  meloxicam (MOBIC) 7.5 MG tablet Take 7.5 mg by mouth daily. Reported on 05/27/2015    Historical Provider, MD  minocycline (MINOCIN,DYNACIN) 100 MG capsule Take 1 capsule (100 mg total) by mouth 2 (two) times daily. 04/08/15   Demetrios Loll, MD  mirtazapine (REMERON) 15 MG tablet Take 15 mg by mouth at bedtime. Reported on 05/27/2015    Historical Provider, MD   nitroGLYCERIN (NITROSTAT) 0.4 MG SL tablet Place 0.4 mg under the tongue every 5 (five) minutes as needed for chest pain.    Historical Provider, MD  OMEGA-3 FATTY ACIDS PO Take 1 capsule by mouth daily. Reported on 05/27/2015    Historical Provider, MD  omeprazole (PRILOSEC) 20 MG capsule Take 20 mg by mouth daily.    Historical Provider, MD  oxyCODONE-acetaminophen (ROXICET) 5-325 MG tablet Take 1-2 tablets by mouth every 4 (four) hours as needed for severe pain. 05/29/15   Sharlotte Alamo, DPM  piperacillin-tazobactam (ZOSYN) 3.375 GM/50ML IVPB Inject 50 mLs (3.375 g total) into the  vein every 8 (eight) hours. 05/30/15 06/13/15  Fritzi Mandes, MD  potassium chloride SA (K-DUR,KLOR-CON) 20 MEQ tablet Take 1 tablet (20 mEq total) by mouth daily. 11/02/14   Alisa Graff, FNP  propranolol (INDERAL) 20 MG tablet Take 40 mg by mouth 3 (three) times daily.    Historical Provider, MD  ramipril (ALTACE) 5 MG capsule Take 5 mg by mouth daily.    Historical Provider, MD  simvastatin (ZOCOR) 20 MG tablet Take 20 mg by mouth at bedtime. Reported on 05/27/2015    Historical Provider, MD  sulfamethoxazole-trimethoprim (BACTRIM DS,SEPTRA DS) 800-160 MG tablet Take 1 tablet by mouth every 12 (twelve) hours. 05/30/15   Fritzi Mandes, MD   No results found.  Positive ROS: All other systems have been reviewed and were otherwise negative with the exception of those mentioned in the HPI and as above.  12 point ROS was performed.  Physical Exam: General: Alert and oriented.  No apparent distress.  Vascular:  Left foot:Dorsalis Pedis:  absent Posterior Tibial:  absent  Right foot: Dorsalis Pedis:  absent Posterior Tibial:  absent  Neuro:absent protective sensation   Derm:Right foot TMA with necrosis medial to lateral along incision and extending approx. 2cm dorsal and plantar.  Noted boggy feel throughout TMA and serrous bloody drainage with mild purulence.  Diffuse erythema to right foot amp site.  Ortho/MS: Left foot  without issue.  Right TMA with mild edema    Assessment: Gangrenous trans-met amputation site with severe PVD.  Plan: Have d/w Vascular Surgery and agree pt would likely benefit from BKA.  Pt has undergone 2 previous procedures with poor results.  D/W pt at length attempted salvage could be performed with high chance of failure.  Vasc is willing to attempt revascularization if pt is adamant for limb salvage.  Pt not sure of her decision tonight and would like to d/w with family.  Will make NPO tonight in case pt decides to go with angio and vasc surgery can perform tomorrow.  Would then proceed with more proximal foot amputation (choparts level) on Friday.  Otherwise pt could likely undergo BKA tomorrow or Friday.    Elesa Hacker, DPM Cell [phone]   06/05/2015 8:28 PM

## 2015-06-05 NOTE — NC FL2 (Signed)
[place] LEVEL OF CARE SCREENING TOOL     IDENTIFICATION  Patient Name: [name] Birthdate: July 21, 1945 Sex: female Admission Date (Current Location): 06/04/2015  [place] and [place] Number:  Engineering geologist and Address:  [facility], [address] [place], [place] [address]      Provider Number: [id number]  Attending Physician Name and Address:  Hillary Bow, MD  Relative Name and Phone Number:       Current Level of Care: Hospital Recommended Level of Care: [place] Prior Approval Number:    Date Approved/Denied:   PASRR Number:  ([id number])  Discharge Plan: SNF    Current Diagnoses: Patient Active Problem List   Diagnosis Date Noted  . Gangrene of foot ([place]) 06/04/2015  . Osteomyelitis ([place]) 05/27/2015  . Diabetic osteomyelitis ([place]) 04/02/2015  . Osteomyelitis of ankle or foot ([place]) 04/02/2015  . Weight loss 02/21/2015  . Folate deficiency 02/21/2015  . Cellulitis of great toe of right foot 02/02/2015  . Diabetes mellitus ([place]) 02/02/2015  . CKD (chronic kidney disease), stage III 02/02/2015  . Anemia of chronic disease 02/02/2015  . Anemia 01/30/2015  . Infected wound ([place]) 01/29/2015  . Subdural hematoma ([place]) 12/06/2014  . Pressure ulcer 12/05/2014  . SDH (subdural hematoma) ([place]) 12/03/2014  . Chronic diastolic heart failure ([place]) 11/02/2014  . Melena 10/02/2014  . DM (diabetes mellitus) ([place]) 02/04/2010  . Deficiency anemia 02/04/2010  . Essential hypertension 02/04/2010  . Coronary atherosclerosis 02/04/2010    Orientation RESPIRATION BLADDER Height & Weight     Self, Time, Situation, Place  Normal Continent Weight: 159 lb 11.2 oz (72.439 kg) Height:  5\' 7"  (170.2 cm)  BEHAVIORAL SYMPTOMS/MOOD NEUROLOGICAL BOWEL NUTRITION STATUS   (none )  (none ) Continent Diet (Diet: Heart Healthy/ Carb Modified )  AMBULATORY STATUS COMMUNICATION OF NEEDS Skin   Extensive Assist Verbally  Surgical wounds, Wound Vac (Incision: Right Foot )                       Personal Care Assistance Level of Assistance  Bathing, Feeding, Dressing Bathing Assistance: Limited assistance Feeding assistance: Independent Dressing Assistance: Limited assistance     Functional Limitations Info  Sight, Hearing, Speech Sight Info: Adequate Hearing Info: Adequate Speech Info: Adequate    SPECIAL CARE FACTORS FREQUENCY  PT (By licensed PT), OT (By licensed OT)     PT Frequency:  (5) OT Frequency:  (5)            Contractures      Additional Factors Info  Code Status, Allergies, Insulin Sliding Scale, Isolation Precautions Code Status Info:  (Full Code. ) Allergies Info:  (Codeine)   Insulin Sliding Scale Info:  (NovoLog Insulin Injections ) Isolation Precautions Info:  (01/29/15 ESBL in wound )     Current Medications (06/05/2015):  This is the current hospital active medication list Current Facility-Administered Medications  Medication Dose Route Frequency Provider Last Rate Last Dose  . 0.9 %  sodium chloride infusion   Intravenous Continuous Vaughan Basta, MD 100 mL/hr at 06/04/15 1645 100 mL/hr at 06/04/15 1645  . acetaminophen (TYLENOL) tablet 650 mg  650 mg Oral Q4H PRN Vaughan Basta, MD      . calcium-vitamin D (OSCAL WITH D) 500-200 MG-UNIT per tablet 1 tablet  1 tablet Oral BID Vaughan Basta, MD   1 tablet at 06/04/15 2126  . cloNIDine (CATAPRES) tablet 0.2 mg  0.2 mg Oral TID [employer]  Anselm Jungling, MD   0.2 mg at 06/04/15 2126  . docusate sodium (COLACE) capsule 100 mg  100 mg Oral BID Vaughan Basta, MD   100 mg at 06/04/15 2126  . ferrous sulfate tablet 325 mg  325 mg Oral Daily Vaughan Basta, MD   325 mg at 06/04/15 1101  . folic acid (FOLVITE) tablet 1 mg  1 mg Oral Daily Vaughan Basta, MD   1 mg at 06/04/15 1102  . heparin injection 5,000 Units  5,000 Units Subcutaneous 3 times per day Vaughan Basta, MD    5,000 Units at 06/05/15 0602  . insulin aspart (novoLOG) injection 0-9 Units  0-9 Units Subcutaneous TID WC Vaughan Basta, MD   2 Units at 06/04/15 1700  . isosorbide dinitrate (ISORDIL) tablet 30 mg  30 mg Oral BID Vaughan Basta, MD   30 mg at 06/04/15 2126  . labetalol (NORMODYNE) tablet 100 mg  100 mg Oral BID Vaughan Basta, MD   100 mg at 06/04/15 2127  . mirtazapine (REMERON) tablet 15 mg  15 mg Oral QHS Vaughan Basta, MD   15 mg at 06/04/15 2127  . nitroGLYCERIN (NITROSTAT) SL tablet 0.4 mg  0.4 mg Sublingual Q5 min PRN Vaughan Basta, MD      . oxyCODONE-acetaminophen (PERCOCET/ROXICET) 5-325 MG per tablet 1-2 tablet  1-2 tablet Oral Q4H PRN Vaughan Basta, MD      . pantoprazole (PROTONIX) EC tablet 40 mg  40 mg Oral Daily Vaughan Basta, MD   40 mg at 06/04/15 1101  . piperacillin-tazobactam (ZOSYN) IVPB 3.375 g  3.375 g Intravenous Q12H [name], RPH   3.375 g at 06/04/15 2358  . propranolol (INDERAL) tablet 40 mg  40 mg Oral TID Vaughan Basta, MD   40 mg at 06/04/15 2126  . simvastatin (ZOCOR) tablet 20 mg  20 mg Oral QHS Vaughan Basta, MD   20 mg at 06/04/15 2127  . traMADol (ULTRAM) tablet 25-50 mg  25-50 mg Oral TID PRN Vaughan Basta, MD         Discharge Medications: Please see discharge summary for a list of discharge medications.  Relevant Imaging Results:  Relevant Lab Results:   Additional Information  (SSN: [id number])  [name], LCSW

## 2015-06-06 ENCOUNTER — Encounter
Admission: AD | Disposition: A | Discharge status: Skilled Nursing Facility | Payer: Self-pay | Source: Ambulatory Visit | Attending: Internal Medicine

## 2015-06-06 HISTORY — PX: PERIPHERAL VASCULAR CATHETERIZATION: SHX172C

## 2015-06-06 LAB — CBC WITH DIFFERENTIAL/PLATELET
Basophils Absolute: 0 10*3/uL (ref 0–0.1)
Basophils Relative: 1 %
EOS ABS: 0.2 10*3/uL (ref 0–0.7)
EOS PCT: 5 %
HCT: 21.3 % — ABNORMAL LOW (ref 35.0–47.0)
Hemoglobin: 6.9 g/dL — ABNORMAL LOW (ref 12.0–16.0)
LYMPHS ABS: 0.9 10*3/uL — AB (ref 1.0–3.6)
Lymphocytes Relative: 26 %
MCH: 28.5 pg (ref 26.0–34.0)
MCHC: 32.5 g/dL (ref 32.0–36.0)
MCV: 87.7 fL (ref 80.0–100.0)
MONOS PCT: 5 %
Monocytes Absolute: 0.2 10*3/uL (ref 0.2–0.9)
Neutro Abs: 2.3 10*3/uL (ref 1.4–6.5)
Neutrophils Relative %: 63 %
PLATELETS: 125 10*3/uL — AB (ref 150–440)
RBC: 2.43 MIL/uL — ABNORMAL LOW (ref 3.80–5.20)
RDW: 17.5 % — ABNORMAL HIGH (ref 11.5–14.5)
WBC: 3.5 10*3/uL — AB (ref 3.6–11.0)

## 2015-06-06 LAB — HEMOGLOBIN AND HEMATOCRIT, BLOOD
HEMATOCRIT: 25.3 % — AB (ref 35.0–47.0)
Hemoglobin: 8.3 g/dL — ABNORMAL LOW (ref 12.0–16.0)

## 2015-06-06 LAB — BASIC METABOLIC PANEL
Anion gap: 6 (ref 5–15)
BUN: 65 mg/dL — AB (ref 6–20)
CO2: 18 mmol/L — ABNORMAL LOW (ref 22–32)
CREATININE: 3.5 mg/dL — AB (ref 0.44–1.00)
Calcium: 7.3 mg/dL — ABNORMAL LOW (ref 8.9–10.3)
Chloride: 114 mmol/L — ABNORMAL HIGH (ref 101–111)
GFR calc Af Amer: 14 mL/min — ABNORMAL LOW (ref >60)
GFR, EST NON AFRICAN AMERICAN: 12 mL/min — AB (ref >60)
Glucose, Bld: 123 mg/dL — ABNORMAL HIGH (ref 65–99)
Potassium: 4.7 mmol/L (ref 3.5–5.1)
SODIUM: 138 mmol/L (ref 135–145)

## 2015-06-06 LAB — PREPARE RBC (CROSSMATCH)

## 2015-06-06 LAB — GLUCOSE, CAPILLARY
GLUCOSE-CAPILLARY: 104 mg/dL — AB (ref 65–99)
GLUCOSE-CAPILLARY: 110 mg/dL — AB (ref 65–99)
GLUCOSE-CAPILLARY: 123 mg/dL — AB (ref 65–99)
Glucose-Capillary: 147 mg/dL — ABNORMAL HIGH (ref 65–99)

## 2015-06-06 LAB — VANCOMYCIN, RANDOM: VANCOMYCIN RM: 8 ug/mL

## 2015-06-06 SURGERY — ABDOMINAL AORTOGRAM W/LOWER EXTREMITY
Wound class: Clean

## 2015-06-06 MED ORDER — LIDOCAINE HCL (PF) 1 % IJ SOLN
INTRAMUSCULAR | Status: AC
  Filled 2015-06-06: qty 30

## 2015-06-06 MED ORDER — HEPARIN (PORCINE) IN NACL 2-0.9 UNIT/ML-% IJ SOLN
INTRAMUSCULAR | Status: AC
  Filled 2015-06-06: qty 1000

## 2015-06-06 MED ORDER — SODIUM BICARBONATE BOLUS VIA INFUSION
INTRAVENOUS | Status: AC
  Administered 2015-06-06: 13:00:00 via INTRAVENOUS
  Filled 2015-06-06: qty 1

## 2015-06-06 MED ORDER — LIDOCAINE-EPINEPHRINE (PF) 1 %-1:200000 IJ SOLN
INTRAMUSCULAR | Status: DC | PRN
  Administered 2015-06-06: 10 mL via INTRADERMAL

## 2015-06-06 MED ORDER — DEXTROSE 5 % IV SOLN
1.5000 g | Freq: Once | INTRAVENOUS | Status: AC
  Administered 2015-06-06: 1.5 g via INTRAVENOUS

## 2015-06-06 MED ORDER — MIDAZOLAM HCL 2 MG/2ML IJ SOLN
INTRAMUSCULAR | Status: DC | PRN
  Administered 2015-06-06: 2 mg via INTRAVENOUS

## 2015-06-06 MED ORDER — CHLORHEXIDINE GLUCONATE CLOTH 2 % EX PADS: 6.0000 | MEDICATED_PAD | Freq: Once | CUTANEOUS | Status: DC

## 2015-06-06 MED ORDER — IOHEXOL 300 MG/ML  SOLN
INTRAMUSCULAR | Status: DC | PRN
  Administered 2015-06-06: 70 mL via INTRA_ARTERIAL

## 2015-06-06 MED ORDER — FENTANYL CITRATE (PF) 100 MCG/2ML IJ SOLN
INTRAMUSCULAR | Status: DC | PRN
  Administered 2015-06-06: 50 ug via INTRAVENOUS

## 2015-06-06 MED ORDER — SODIUM CHLORIDE 0.9 % IV SOLN: INTRAVENOUS | Status: DC

## 2015-06-06 MED ORDER — OXYCODONE-ACETAMINOPHEN 5-325 MG PO TABS
ORAL_TABLET | ORAL | Status: AC
  Filled 2015-06-06: qty 2

## 2015-06-06 MED ORDER — SODIUM BICARBONATE 8.4 % IV SOLN
INTRAVENOUS | Status: AC
  Administered 2015-06-06: 14:00:00 via INTRAVENOUS
  Filled 2015-06-06: qty 1000

## 2015-06-06 MED ORDER — SODIUM CHLORIDE 0.9 % IV SOLN: Freq: Once | INTRAVENOUS | Status: DC

## 2015-06-06 MED ORDER — HEPARIN SODIUM (PORCINE) 1000 UNIT/ML IJ SOLN
INTRAMUSCULAR | Status: AC
  Filled 2015-06-06: qty 1

## 2015-06-06 MED ORDER — MIDAZOLAM HCL 5 MG/5ML IJ SOLN
INTRAMUSCULAR | Status: AC
  Filled 2015-06-06: qty 5

## 2015-06-06 MED ORDER — FENTANYL CITRATE (PF) 100 MCG/2ML IJ SOLN
INTRAMUSCULAR | Status: AC
  Filled 2015-06-06: qty 2

## 2015-06-06 MED ORDER — HEPARIN SODIUM (PORCINE) 1000 UNIT/ML IJ SOLN
INTRAMUSCULAR | Status: DC | PRN
  Administered 2015-06-06: 3500 [IU] via INTRAVENOUS

## 2015-06-06 MED ORDER — VANCOMYCIN HCL IN DEXTROSE 1-5 GM/200ML-% IV SOLN
1000.0000 mg | INTRAVENOUS | Status: DC
  Administered 2015-06-06 – 2015-06-09 (×3): 1000 mg via INTRAVENOUS
  Filled 2015-06-06 (×4): qty 200

## 2015-06-06 SURGICAL SUPPLY — 19 items
BALLN ULTRVRSE 2X300X150 (BALLOONS) ×5
BALLN ULTRVRSE 2X300X150 OTW (BALLOONS) ×3
BALLN ULTRVRSE 3X150X150 (BALLOONS) ×5
BALLOON ULTRVRSE 2X300X150 OTW (BALLOONS) ×1 IMPLANT
BALLOON ULTRVRSE 3X150X150 (BALLOONS) ×1 IMPLANT
CATH CXI SUPP ANG 4FR 135 (MICROCATHETER) ×1 IMPLANT
CATH CXI SUPP ANG 4FR 135CM (MICROCATHETER) ×5
CATH PIG 70CM (CATHETERS) ×5 IMPLANT
CATH VERT 100CM (CATHETERS) ×3 IMPLANT
DEVICE PRESTO INFLATION (MISCELLANEOUS) ×3 IMPLANT
DEVICE STARCLOSE SE CLOSURE (Vascular Products) ×3 IMPLANT
GLIDEWIRE ADV .035X260CM (WIRE) ×3 IMPLANT
PACK ANGIOGRAPHY (CUSTOM PROCEDURE TRAY) ×5 IMPLANT
SHEATH ANL2 6FRX45 HC (SHEATH) ×3 IMPLANT
SHEATH BRITE TIP 5FRX11 (SHEATH) ×5 IMPLANT
SYR MEDRAD MARK V 150ML (SYRINGE) ×5 IMPLANT
TUBING CONTRAST HIGH PRESS 72 (TUBING) ×5 IMPLANT
WIRE G V18X300CM (WIRE) ×3 IMPLANT
WIRE J 3MM .035X145CM (WIRE) ×5 IMPLANT

## 2015-06-06 NOTE — Care Management Important Message (Signed)
Important Message  Patient Details  Name: [name] MRN: [id number] Date of Birth: 1945-10-17   Medicare Important Message Given:  Yes    [name], RN 06/06/2015, 8:37 AM

## 2015-06-06 NOTE — H&P (Signed)
  [facility] History & Physical Update  The patient was interviewed and re-examined.  The patient's previous History and Physical has been reviewed and is unchanged.  There is no change in the plan of care. We plan to proceed with the scheduled procedure.  DEW,JASON, MD  06/06/2015, 1:23 PM

## 2015-06-06 NOTE — Progress Notes (Signed)
[place] at [place] NAME: [name]    MR#:  [id number]  DATE OF BIRTH:  11-Nov-1945  SUBJECTIVE:  CHIEF COMPLAINT:  No chief complaint on file.  Afebrile No SOB/CP/Abd pain Angiogram later today  REVIEW OF SYSTEMS:    Review of Systems  Constitutional: Negative for fever and chills.  HENT: Negative for sore throat.   Eyes: Negative for blurred vision, double vision and pain.  Respiratory: Negative for cough, hemoptysis, shortness of breath and wheezing.   Cardiovascular: Negative for chest pain, palpitations, orthopnea and leg swelling.  Gastrointestinal: Negative for heartburn, nausea, vomiting, abdominal pain, diarrhea and constipation.  Genitourinary: Negative for dysuria and hematuria.  Musculoskeletal: Negative for back pain and joint pain.  Skin: Negative for rash.  Neurological: Negative for sensory change, speech change, focal weakness and headaches.  Endo/Heme/Allergies: Does not bruise/bleed easily.  Psychiatric/Behavioral: Negative for depression. The patient is not nervous/anxious.       DRUG ALLERGIES:   Allergies  Allergen Reactions  . Codeine Other (See Comments)    Reaction:  Hallucinations    VITALS:  Blood pressure 157/64, pulse 63, temperature 98.8 F (37.1 C), temperature source Oral, resp. rate 18, height 5\' 7"  (1.702 m), weight 72.439 kg (159 lb 11.2 oz), SpO2 96 %.  PHYSICAL EXAMINATION:   Physical Exam  GENERAL:  71 y.o.-year-old patient lying in the bed with no acute distress.  EYES: Pupils equal, round, reactive to light and accommodation. No scleral icterus. Extraocular muscles intact.  HEENT: Head atraumatic, normocephalic. Oropharynx and nasopharynx clear.  NECK:  Supple, no jugular venous distention. No thyroid enlargement, no tenderness.  LUNGS: Normal breath sounds bilaterally, no wheezing, rales, rhonchi. No use of accessory muscles of respiration.  CARDIOVASCULAR: S1, S2  normal. No murmurs, rubs, or gallops.  ABDOMEN: Soft, nontender, nondistended. Bowel sounds present. No organomegaly or mass.  EXTREMITIES: No cyanosis, clubbing or edema b/l.    NEUROLOGIC: Cranial nerves II through XII are intact. No focal Motor or sensory deficits b/l.   PSYCHIATRIC: The patient is alert and oriented x 3.  SKIN: No obvious rash.  Sacral pressure ulcer  Right foot dressing. Not opened.   LABORATORY PANEL:   CBC  Recent Labs Lab 06/06/15 0427  WBC 3.5*  HGB 6.9*  HCT 21.3*  PLT 125*   ------------------------------------------------------------------------------------------------------------------  Chemistries   Recent Labs Lab 06/06/15 0427  NA 138  K 4.7  CL 114*  CO2 18*  GLUCOSE 123*  BUN 65*  CREATININE 3.50*  CALCIUM 7.3*   ------------------------------------------------------------------------------------------------------------------  Cardiac Enzymes No results for input(s): TROPONINI in the last 168 hours. ------------------------------------------------------------------------------------------------------------------  RADIOLOGY:  No results found.   ASSESSMENT AND PLAN:   * Worsening Anemia of chronic disease No bleeding Transfuse 1 unit PRBC. Consent obtained Repeat labs in AM  * Osteomyelitis of the right foot  Status post transmetatarsal amputation. On IV Zosyn and vancomycin Vascular consult for possible amputation ID consulted for abx recommendations  * Diabetes SSI  * Hypertension Continue clonidine. Hold ramipril and Lasix due to renal failure.  * CHF No acute exacerbation, continue Imdur.  * Acute renal failure over CKD 3 - slow improvement Baseline Cr around 1.7 Hold lasix Start IVF. I/Os. Daily weight. Repeat in AM.  * Hyperkalemia resolved with IVF   All the records are reviewed and case discussed with Care Management/Social Workerr. Management plans discussed with the patient, family  and they are in agreement.  CODE STATUS: FULL  DVT Prophylaxis: SCDs  TOTAL TIME TAKING CARE OF THIS PATIENT: 35 minutes.   POSSIBLE D/C IN 2-3 DAYS, DEPENDING ON CLINICAL CONDITION.   Hillary Bow R M.D on 06/06/2015 at 2:15 PM  Between 7am to 6pm - Pager - [phone]  After 6pm go to [email or web address] - password [email or web address] [place] [facility]  Office  [phone]  CC: Primary care physician; [facility]    Note: This dictation was prepared with Dragon dictation along with smaller phrase technology. Any transcriptional errors that result from this process are unintentional.

## 2015-06-06 NOTE — Op Note (Signed)
[facility] Percutaneous Study/Intervention Procedural Note   Date of Surgery:  06/06/2015  Surgeon(s):DEW,JASON   Assistants:none  Pre-operative Diagnosis: PAD with gangrene right foot  Post-operative diagnosis: Same  Procedure(s) Performed: 1. Ultrasound guidance for vascular access left femoral artery 2. Catheter placement into right AT artery and peroneal artery 3. Aortogram and selective right lower extremity angiogram 4. Percutaneous transluminal angioplasty of the TP trunk and proximal peroneal artery with 3 mm x 15 cm balloon 5. Percutaneous transluminal angioplasty of the entire AT artery with 2 mm diameter balloon distally and 3 mm balloon proximally  6.  StarClose closure device left femoral artery  EBL: 25 cc  Contrast: 70 cc  Fluro Time: 6.8  minutes  Moderate Conscious Sedation Time: approximately 45 minutes using 2 mg of Versed and 50 mcg of Fentanyl  Indications: Patient is a 70 y.o.female with gangrene and infection and non-healing of her right TMA. The patient is brought in for angiography for further evaluation and potential treatment. Risks and benefits are discussed and informed consent is obtained  Procedure: The patient was identified and appropriate procedural time out was performed. The patient was then placed supine on the table and prepped and draped in the usual sterile fashion.Moderate conscious sedation was administered throughout the procedure with my supervision of the RN administering medicines and monitoring the patient's vital signs, pulse oximetry, telemetry and mental status throughout from the start of the procedure until the patient was taken to the recovery room. Ultrasound was used to evaluate the left common femoral artery. It was patent . A digital ultrasound image was acquired. A Seldinger needle was used to access the left common  femoral artery under direct ultrasound guidance and a permanent image was performed. A 0.035 J wire was advanced without resistance and a 5Fr sheath was placed. Pigtail catheter was placed into the aorta and an AP aortogram was performed. This demonstrated normal renal arteries and normal aorta and iliac segments without significant stenosis. I then crossed the aortic bifurcation and advanced to the right femoral head. Selective right lower extremity angiogram was then performed. This demonstrated normal flow to the tibial trifurcation and then occlusion of the anterior tibial artery which reconstituted in the lower leg, the PT artery was also occluded with reconstitution at the ankle.  The origin of the peroneal artery which was the dominant runoff initially. The patient was systemically heparinized and a 6 [place] [employer] sheath was then placed over the [employer] wire. I then used a Kumpe catheter and the advantage wire to navigate down into the origin of the anterior tibial artery. I then exchanged for a 0.018 wire and a CXI catheter and crossed the occlusion parking the V 18 wire in the foot. I then decided to proceed with intervention. A 2 mm diameter by 30 cm length angioplasty balloon was inflated from the foot to the proximal to mid anterior tibial artery. This was inflated to 10 atm for 1 minute. I then used a 3 mm diameter by 15 cm length angioplasty balloon to treat the proximal anterior tibial artery. This was inflated to 8 atm for 1 minute. Angiogram following these treatment showed in-line flow to the anterior tibial artery into the foot with no greater than 40% residual stenosis identified. I then turned my attention to the tibioperoneal trunk stenosis. I crossed this lesion without difficulty with the V 18 wire. The 3 mm diameter by 15 cm length angioplasty balloon was then used to treat the tibioperoneal  trunk and proximal peroneal artery. This was inflated to 10 atm for 1 minute. Completion  angiogram showed less than 10% residual stenosis with in-line flow distally.. I elected to terminate the procedure. The sheath was removed and StarClose closure device was deployed in the left femoral artery with excellent hemostatic result. The patient was taken to the recovery room in stable condition having tolerated the procedure well.  Findings:  Aortogram: normal renal arteries, aorta and iliac arteries calcified but not stenotic  Right Lower Extremity: severe tibial disease   Disposition: Patient was taken to the recovery room in stable condition having tolerated the procedure well.  Complications: None  DEW,JASON 06/06/2015 3:19 PM

## 2015-06-06 NOTE — Progress Notes (Signed)
Spoke with Dr.Sudini regarding pt's hgb- of 6.9 Order received for type and screen for this am.

## 2015-06-06 NOTE — Progress Notes (Signed)
Patient take to specials procedure for angio via bed, nurse at side during transfer due to pt receiving blood.

## 2015-06-06 NOTE — Progress Notes (Signed)
Patient return back from specials procedure- post angio, left groin dressing with drainage- MD aware per report given, v//s assessed. Blood sugar checked.

## 2015-06-06 NOTE — Progress Notes (Signed)
ANTIBIOTIC CONSULT NOTE - Follow Up  Pharmacy Consult for vancomycin and piperacillin/tazobactam Indication: Osteomyelitis  Allergies  Allergen Reactions  . Codeine Other (See Comments)    Reaction:  Hallucinations   Patient Measurements: Height: 5\' 7"  (170.2 cm) Weight: 159 lb 11.2 oz (72.439 kg) IBW/kg (Calculated) : 61.6 Adjusted Body Weight: n/a  Vital Signs: Temp: 96.5 F (35.8 C) (02/02 2033) Temp Source: Axillary (02/02 2033) BP: 147/58 mmHg (02/02 2033) Pulse Rate: 59 (02/02 2033) Intake/Output from previous day: 02/01 0701 - 02/02 0700 In: 1433.3 [P.O.:240; I.V.:1143.3; IV Piggyback:50] Out: 1050 [Urine:1050] Intake/Output from this shift:    Labs:  Recent Labs  06/04/15 1220 06/05/15 0600 06/06/15 0427 06/06/15 1702  WBC 5.0 4.3 3.5*  --   HGB 7.9* 7.7* 6.9* 8.3*  PLT 136* 138* 125*  --   CREATININE 3.77* 3.65* 3.50*  --    Estimated Creatinine Clearance: 14.5 mL/min (by C-G formula based on Cr of 3.5).  Recent Labs  06/06/15 2035  [facility] 8     Assessment: Pharmacy consulted to dose vancomycin and piperacillin/tazobactam for osteomyelitis in this 70 year old female. Patient has a current CrCl of ~13.9 mL/min due to acute renal failure. Patient recently underwent a transmetatarsal amputation and was discharged with piperacillin/tazobactam and PO SMX/TMP to a nursing home last week. Patient returned to hospital due to possible gangrene at amputation site. ID consulted.  Use actual body weight of 72.4 kg  Kinetics: Ke: 0.016 Half-life: 43 hours Vd: 50.6 L    Goal of Therapy:  Vancomycin trough level 15-20 mcg/ml  Plan:   1. Continue Zosyn 3.375 gm IV q12h per EI protocol based on renal function.  2. Patient received Vancomycin 1250 mg IV once at 2130 on 1/31.  Will order Vancomycin 1000 mg IV q48h based on kinetic parameters.  Trough ordered prior to next dose on 2/2 at 2000 to help guide therapy as patient is in acute renal failure.    Thank you for allowing pharmacy to be a part of this patient's care.   2/02:  Vanc trough @ 20:00 = 8 mcg/mL.  This level is 48 hrs post initial dose. Will adjust dose to Vancomycin 1 gm IV Q36H to start 2/02 @ 22:00.  Will draw next trough on 2/04 @ 09:30.   Orene Desanctis, PharmD Clinical Pharmacist 06/06/2015,9:48 PM

## 2015-06-07 ENCOUNTER — Inpatient Hospital Stay: Payer: Commercial Managed Care - HMO | Admitting: Anesthesiology

## 2015-06-07 ENCOUNTER — Encounter
Admission: AD | Disposition: A | Discharge status: Skilled Nursing Facility | Payer: Self-pay | Source: Ambulatory Visit | Attending: Internal Medicine

## 2015-06-07 ENCOUNTER — Encounter: Payer: Self-pay | Admitting: Vascular Surgery

## 2015-06-07 HISTORY — PX: TRANSMETATARSAL AMPUTATION: SHX6197

## 2015-06-07 HISTORY — PX: APPLICATION OF WOUND VAC: SHX5189

## 2015-06-07 LAB — BASIC METABOLIC PANEL
ANION GAP: 4 — AB (ref 5–15)
BUN: 62 mg/dL — ABNORMAL HIGH (ref 6–20)
CALCIUM: 7.6 mg/dL — AB (ref 8.9–10.3)
CO2: 22 mmol/L (ref 22–32)
CREATININE: 3.16 mg/dL — AB (ref 0.44–1.00)
Chloride: 112 mmol/L — ABNORMAL HIGH (ref 101–111)
GFR, EST AFRICAN AMERICAN: 16 mL/min — AB (ref >60)
GFR, EST NON AFRICAN AMERICAN: 14 mL/min — AB (ref >60)
Glucose, Bld: 153 mg/dL — ABNORMAL HIGH (ref 65–99)
Potassium: 4.5 mmol/L (ref 3.5–5.1)
SODIUM: 138 mmol/L (ref 135–145)

## 2015-06-07 LAB — ANAEROBIC CULTURE

## 2015-06-07 LAB — CBC
HCT: 26.3 % — ABNORMAL LOW (ref 35.0–47.0)
HEMOGLOBIN: 8.7 g/dL — AB (ref 12.0–16.0)
MCH: 28.8 pg (ref 26.0–34.0)
MCHC: 33 g/dL (ref 32.0–36.0)
MCV: 87.4 fL (ref 80.0–100.0)
PLATELETS: 151 10*3/uL (ref 150–440)
RBC: 3.01 MIL/uL — AB (ref 3.80–5.20)
RDW: 18.9 % — ABNORMAL HIGH (ref 11.5–14.5)
WBC: 7.4 10*3/uL (ref 3.6–11.0)

## 2015-06-07 LAB — GLUCOSE, CAPILLARY
GLUCOSE-CAPILLARY: 133 mg/dL — AB (ref 65–99)
GLUCOSE-CAPILLARY: 96 mg/dL (ref 65–99)
GLUCOSE-CAPILLARY: 92 mg/dL (ref 65–99)
GLUCOSE-CAPILLARY: 151 mg/dL — AB (ref 65–99)
Glucose-Capillary: 89 mg/dL (ref 65–99)
Glucose-Capillary: 91 mg/dL (ref 65–99)

## 2015-06-07 SURGERY — AMPUTATION, FOOT, TRANSMETATARSAL
Anesthesia: General | Site: Foot | Laterality: Right | Wound class: Dirty or Infected

## 2015-06-07 MED ORDER — PHENYLEPHRINE HCL 10 MG/ML IJ SOLN
INTRAMUSCULAR | Status: DC | PRN
  Administered 2015-06-07: 100 ug via INTRAVENOUS
  Administered 2015-06-07: 200 ug via INTRAVENOUS
  Administered 2015-06-07: 50 ug via INTRAVENOUS
  Administered 2015-06-07: 100 ug via INTRAVENOUS

## 2015-06-07 MED ORDER — PROPOFOL 10 MG/ML IV BOLUS
INTRAVENOUS | Status: DC | PRN
  Administered 2015-06-07: 90 mg via INTRAVENOUS

## 2015-06-07 MED ORDER — ONDANSETRON HCL 4 MG/2ML IJ SOLN: 4.0000 mg | Freq: Once | INTRAMUSCULAR | Status: DC | PRN

## 2015-06-07 MED ORDER — ESMOLOL HCL 100 MG/10ML IV SOLN
INTRAVENOUS | Status: AC
  Administered 2015-06-07: 20 mg
  Filled 2015-06-07: qty 10

## 2015-06-07 MED ORDER — METOPROLOL TARTRATE 1 MG/ML IV SOLN
INTRAVENOUS | Status: DC | PRN
  Administered 2015-06-07: 1 mg via INTRAVENOUS
  Administered 2015-06-07: 2 mg via INTRAVENOUS

## 2015-06-07 MED ORDER — SODIUM CHLORIDE 0.9 % IV SOLN
INTRAVENOUS | Status: DC | PRN
  Administered 2015-06-07: 14:00:00 via INTRAVENOUS

## 2015-06-07 MED ORDER — EPHEDRINE SULFATE 50 MG/ML IJ SOLN
INTRAMUSCULAR | Status: DC | PRN
  Administered 2015-06-07: 5 mg via INTRAVENOUS
  Administered 2015-06-07: 10 mg via INTRAVENOUS

## 2015-06-07 MED ORDER — ESMOLOL BOLUS VIA INFUSION
500.0000 ug/kg | Freq: Once | INTRAVENOUS | Status: AC
  Administered 2015-06-07: 20 ug via INTRAVENOUS

## 2015-06-07 MED ORDER — THROMBIN 5000 UNITS EX SOLR
CUTANEOUS | Status: AC
  Filled 2015-06-07: qty 5000

## 2015-06-07 MED ORDER — DEXTROSE-NACL 5-0.9 % IV SOLN
INTRAVENOUS | Status: DC
  Administered 2015-06-07: 13:00:00 via INTRAVENOUS

## 2015-06-07 MED ORDER — ONDANSETRON HCL 4 MG/2ML IJ SOLN
INTRAMUSCULAR | Status: DC | PRN
  Administered 2015-06-07: 4 mg via INTRAVENOUS

## 2015-06-07 MED ORDER — SODIUM CHLORIDE 0.9 % IV SOLN
10000.0000 ug | INTRAVENOUS | Status: DC | PRN
  Administered 2015-06-07: 50 ug/min via INTRAVENOUS

## 2015-06-07 MED ORDER — GLYCOPYRROLATE 0.2 MG/ML IJ SOLN
INTRAMUSCULAR | Status: DC | PRN
  Administered 2015-06-07: 0.2 mg via INTRAVENOUS

## 2015-06-07 MED ORDER — FENTANYL CITRATE (PF) 100 MCG/2ML IJ SOLN
INTRAMUSCULAR | Status: DC | PRN
  Administered 2015-06-07: 50 ug via INTRAVENOUS
  Administered 2015-06-07: 25 ug via INTRAVENOUS

## 2015-06-07 MED ORDER — ESMOLOL HCL 100 MG/10ML IV SOLN
INTRAVENOUS | Status: DC | PRN
  Administered 2015-06-07: 10 mg via INTRAVENOUS

## 2015-06-07 MED ORDER — FENTANYL CITRATE (PF) 100 MCG/2ML IJ SOLN: 25.0000 ug | INTRAMUSCULAR | Status: DC | PRN

## 2015-06-07 SURGICAL SUPPLY — 78 items
APPLICATOR SURGIFLO (MISCELLANEOUS) ×1 IMPLANT
BANDAGE ACE 4X5 VEL STRL LF (GAUZE/BANDAGES/DRESSINGS) ×2 IMPLANT
BANDAGE ELASTIC 4 LF NS (GAUZE/BANDAGES/DRESSINGS) ×2 IMPLANT
BANDAGE STRETCH 3X4.1 STRL (GAUZE/BANDAGES/DRESSINGS) ×2 IMPLANT
BLADE MED AGGRESSIVE (BLADE) ×1 IMPLANT
BLADE OSC/SAGITTAL MD 5.5X18 (BLADE) IMPLANT
BLADE OSCILLATING/SAGITTAL (BLADE) ×2
BLADE SURG 15 STRL LF DISP TIS (BLADE) ×1 IMPLANT
BLADE SURG 15 STRL SS (BLADE) ×2
BLADE SW THK.38XMED LNG THN (BLADE) IMPLANT
BNDG CMPR 75X21 PLY HI ABS (MISCELLANEOUS) ×1
BNDG CMPR MED 5X4 ELC HKLP NS (GAUZE/BANDAGES/DRESSINGS) ×1
BNDG COHESIVE 4X5 TAN STRL (GAUZE/BANDAGES/DRESSINGS) ×2 IMPLANT
BNDG COHESIVE 6X5 TAN STRL LF (GAUZE/BANDAGES/DRESSINGS) ×2 IMPLANT
BNDG ESMARK 4X12 TAN STRL LF (GAUZE/BANDAGES/DRESSINGS) ×2 IMPLANT
BNDG GAUZE 4.5X4.1 6PLY STRL (MISCELLANEOUS) ×2 IMPLANT
CANISTER SUCT 1200ML W/VALVE (MISCELLANEOUS) ×2 IMPLANT
CANISTER SUCT 3000ML (MISCELLANEOUS) ×2 IMPLANT
CUFF TOURN 18 STER (MISCELLANEOUS) ×2 IMPLANT
CUFF TOURN DUAL PL 12 NO SLV (MISCELLANEOUS) ×2 IMPLANT
DRAIN PENROSE 1/4X12 LTX (DRAIN) ×2 IMPLANT
DRAPE FLUOR MINI C-ARM 54X84 (DRAPES) ×1 IMPLANT
DRAPE XRAY CASSETTE 23X24 (DRAPES) IMPLANT
DRESSING ALLEVYN 4X4 (MISCELLANEOUS) IMPLANT
DURAPREP 26ML APPLICATOR (WOUND CARE) ×2 IMPLANT
ELECT REM PT RETURN 9FT ADLT (ELECTROSURGICAL) ×2
ELECTRODE REM PT RTRN 9FT ADLT (ELECTROSURGICAL) ×1 IMPLANT
GAUZE PACKING 1/4X5YD (GAUZE/BANDAGES/DRESSINGS) ×2 IMPLANT
GAUZE PACKING IODOFORM 1X5 (MISCELLANEOUS) ×2 IMPLANT
GAUZE PETRO XEROFOAM 1X8 (MISCELLANEOUS) ×2 IMPLANT
GAUZE SPONGE 4X4 12PLY STRL (GAUZE/BANDAGES/DRESSINGS) ×2 IMPLANT
GAUZE STRETCH 2X75IN STRL (MISCELLANEOUS) ×2 IMPLANT
GLOVE BIO SURGEON STRL SZ7.5 (GLOVE) ×2 IMPLANT
GLOVE INDICATOR 8.0 STRL GRN (GLOVE) ×2 IMPLANT
GOWN STRL REUS W/ TWL LRG LVL3 (GOWN DISPOSABLE) ×2 IMPLANT
GOWN STRL REUS W/TWL LRG LVL3 (GOWN DISPOSABLE) ×4
GOWN STRL REUS W/TWL MED LVL3 (GOWN DISPOSABLE) ×2 IMPLANT
HANDLE YANKAUER SUCT BULB TIP (MISCELLANEOUS) ×2 IMPLANT
HANDPIECE SUCTION TUBG SURGILV (MISCELLANEOUS) ×2 IMPLANT
HANDPIECE VERSAJET DEBRIDEMENT (MISCELLANEOUS) IMPLANT
IV NS 1000ML (IV SOLUTION) ×2
IV NS 1000ML BAXH (IV SOLUTION) ×1 IMPLANT
KIT DRSG VAC SLVR GRANUFM (MISCELLANEOUS) ×2 IMPLANT
KIT RM TURNOVER STRD PROC AR (KITS) ×2 IMPLANT
LABEL OR SOLS (LABEL) ×2 IMPLANT
NDL FILTER BLUNT 18X1 1/2 (NEEDLE) ×1 IMPLANT
NDL HYPO 25X1 1.5 SAFETY (NEEDLE) ×1 IMPLANT
NDL SAFETY 18GX1.5 (NEEDLE) ×2 IMPLANT
NEEDLE FILTER BLUNT 18X 1/2SAF (NEEDLE) ×1
NEEDLE FILTER BLUNT 18X1 1/2 (NEEDLE) ×1 IMPLANT
NEEDLE HYPO 25X1 1.5 SAFETY (NEEDLE) ×2 IMPLANT
NS IRRIG 500ML POUR BTL (IV SOLUTION) ×2 IMPLANT
PACK EXTREMITY ARMC (MISCELLANEOUS) ×2 IMPLANT
PAD ABD DERMACEA PRESS 5X9 (GAUZE/BANDAGES/DRESSINGS) ×2 IMPLANT
PENCIL ELECTRO HAND CTR (MISCELLANEOUS) ×2 IMPLANT
RASP SM TEAR CROSS CUT (RASP) IMPLANT
SOL .9 NS 3000ML IRR  AL (IV SOLUTION) ×1
SOL .9 NS 3000ML IRR AL (IV SOLUTION) ×1
SOL .9 NS 3000ML IRR UROMATIC (IV SOLUTION) ×1 IMPLANT
SPONGE LAP 18X18 5 PK (GAUZE/BANDAGES/DRESSINGS) ×4 IMPLANT
STOCKINETTE IMPERVIOUS 9X36 MD (GAUZE/BANDAGES/DRESSINGS) ×2 IMPLANT
STOCKINETTE M/LG 89821 (MISCELLANEOUS) ×2 IMPLANT
SUT ETHILON 2 0 FS 18 (SUTURE) ×4 IMPLANT
SUT ETHILON 3-0 FS-10 30 BLK (SUTURE) ×4
SUT ETHILON 4-0 (SUTURE) ×2
SUT ETHILON 4-0 FS2 18XMFL BLK (SUTURE) ×1
SUT VIC AB 3-0 54X BRD REEL (SUTURE) IMPLANT
SUT VIC AB 3-0 BRD 54 (SUTURE) ×2
SUT VIC AB 3-0 SH 27 (SUTURE) ×2
SUT VIC AB 3-0 SH 27X BRD (SUTURE) ×1 IMPLANT
SUT VIC AB 4-0 FS2 27 (SUTURE) ×2 IMPLANT
SUTURE EHLN 3-0 FS-10 30 BLK (SUTURE) ×2 IMPLANT
SUTURE ETHLN 4-0 FS2 18XMF BLK (SUTURE) ×1 IMPLANT
SWAB CULTURE AMIES ANAERIB BLU (MISCELLANEOUS) IMPLANT
SYR 3ML LL SCALE MARK (SYRINGE) ×2 IMPLANT
SYRINGE 10CC LL (SYRINGE) ×2 IMPLANT
TRAY PREP VAG/GEN (MISCELLANEOUS) ×2 IMPLANT
WND VAC CANISTER 500ML (MISCELLANEOUS) ×2 IMPLANT

## 2015-06-07 NOTE — Progress Notes (Signed)
[place] at [place] NAME: [name]    MR#:  [id number]  DATE OF BIRTH:  22-Aug-1945  SUBJECTIVE:  CHIEF COMPLAINT:  No chief complaint on file.  Afebrile No SOB/CP/Abd pain RLE angiogram 06/07/2015 Going to surgery for debridement today  REVIEW OF SYSTEMS:    Review of Systems  Constitutional: Negative for fever and chills.  HENT: Negative for sore throat.   Eyes: Negative for blurred vision, double vision and pain.  Respiratory: Negative for cough, hemoptysis, shortness of breath and wheezing.   Cardiovascular: Negative for chest pain, palpitations, orthopnea and leg swelling.  Gastrointestinal: Negative for heartburn, nausea, vomiting, abdominal pain, diarrhea and constipation.  Genitourinary: Negative for dysuria and hematuria.  Musculoskeletal: Negative for back pain and joint pain.  Skin: Negative for rash.  Neurological: Negative for sensory change, speech change, focal weakness and headaches.  Endo/Heme/Allergies: Does not bruise/bleed easily.  Psychiatric/Behavioral: Negative for depression. The patient is not nervous/anxious.       DRUG ALLERGIES:   Allergies  Allergen Reactions  . Codeine Other (See Comments)    Reaction:  Hallucinations    VITALS:  Blood pressure 142/80, pulse 64, temperature 98.7 F (37.1 C), temperature source Oral, resp. rate 18, height 5\' 7"  (1.702 m), weight 72.439 kg (159 lb 11.2 oz), SpO2 97 %.  PHYSICAL EXAMINATION:   Physical Exam  GENERAL:  70 y.o.-year-old patient lying in the bed with no acute distress.  EYES: Pupils equal, round, reactive to light and accommodation. No scleral icterus. Extraocular muscles intact.  HEENT: Head atraumatic, normocephalic. Oropharynx and nasopharynx clear.  NECK:  Supple, no jugular venous distention. No thyroid enlargement, no tenderness.  LUNGS: Normal breath sounds bilaterally, no wheezing, rales, rhonchi. No use of accessory muscles of  respiration.  CARDIOVASCULAR: S1, S2 normal. No murmurs, rubs, or gallops.  ABDOMEN: Soft, nontender, nondistended. Bowel sounds present. No organomegaly or mass.  EXTREMITIES: No cyanosis, clubbing or edema b/l.    NEUROLOGIC: Cranial nerves II through XII are intact. No focal Motor or sensory deficits b/l.   PSYCHIATRIC: The patient is alert and oriented x 3.  SKIN: No obvious rash.  Sacral pressure ulcer  Right foot dressing. Not opened.   LABORATORY PANEL:   CBC  Recent Labs Lab 06/07/15 0539  WBC 7.4  HGB 8.7*  HCT 26.3*  PLT 151   ------------------------------------------------------------------------------------------------------------------  Chemistries   Recent Labs Lab 06/07/15 0539  NA 138  K 4.5  CL 112*  CO2 22  GLUCOSE 153*  BUN 62*  CREATININE 3.16*  CALCIUM 7.6*   ------------------------------------------------------------------------------------------------------------------  Cardiac Enzymes No results for input(s): TROPONINI in the last 168 hours. ------------------------------------------------------------------------------------------------------------------  RADIOLOGY:  No results found.   ASSESSMENT AND PLAN:   * Osteomyelitis of the right foot  Status post transmetatarsal amputation. On IV Zosyn and vancomycin Surgery today with podiatry  * PAD RLE angiogram done with ballon angio  * Acute renal failure over CKD 3 - slow improvement Baseline Cr around 2. Hold lasix Started IVF. I/Os. Daily weight. Repeat in AM.  * Diabetes SSI  * Hypertension Continue clonidine. Hold ramipril and Lasix due to renal failure.  * Worsening Anemia of chronic disease No bleeding Transfused 1 unit PRBC 06/06/2015 Hb stable  * CHF No acute exacerbation, continue Imdur.  * Hyperkalemia resolved with IVF   All the records are reviewed and case discussed with Care Management/Social Workerr. Management plans discussed with the  patient, family and they are  in agreement.  CODE STATUS: FULL  DVT Prophylaxis: SCDs  TOTAL TIME TAKING CARE OF THIS PATIENT: 35 minutes.   POSSIBLE D/C IN 2-3 DAYS, DEPENDING ON CLINICAL CONDITION.   Hillary Bow R M.D on 06/07/2015 at 1:37 PM  Between 7am to 6pm - Pager - [phone]  After 6pm go to [email or web address] - password [email or web address] [place] [facility]  Office  [phone]  CC: Primary care physician; [facility] Acute C    Note: This dictation was prepared with Dragon dictation along with smaller phrase technology. Any transcriptional errors that result from this process are unintentional.

## 2015-06-07 NOTE — Transfer of Care (Signed)
Immediate Anesthesia Transfer of Care Note  Patient: [name]  Procedure(s) Performed: Procedure(s): TRANSMETATARSAL AMPUTATION/ REVISION (Right) APPLICATION OF WOUND VAC (Right)  Patient Location: PACU  Anesthesia Type:General  Level of Consciousness: awake, alert, oriented  Airway & Oxygen Therapy: Patient Spontanous Breathing and Patient connected to face mask oxygen  Post-op Assessment: Report given to RN and Post -op Vital signs reviewed and stable  Post vital signs: Reviewed and stable  Last Vitals:  Filed Vitals:   06/07/15 1055 06/07/15 1341  BP: 142/80 152/64  Pulse:  75  Temp:  36.8 C  Resp:  18    Complications: No apparent anesthesia complications

## 2015-06-07 NOTE — Progress Notes (Signed)
[name] has significant amount serosanguinous drainage from L femoral angio site, through dressing and on bed sheets, also R foot amputation site is bleeding through dressing. Dressings reinforced, MD aware. BP 148/62, nursing continues to monitor.

## 2015-06-07 NOTE — Progress Notes (Signed)
Initial Nutrition Assessment   INTERVENTION:   Meals and Snacks: Cater to patient preferences once able to advance diet order Medical Food Supplement Therapy: recommend Glucerna Shake po TID, each supplement provides 220 kcal and 10 grams of protein Coordination of Care: pt would likely benefit from stronger bowel regimen   NUTRITION DIAGNOSIS:   Increased nutrient needs related to wound healing as evidenced by estimated needs.  GOAL:   Patient will meet greater than or equal to 90% of their needs  MONITOR:    (Energy Intake, Electrolyte and renal Profile, Anthropometrics, Digestive System)  REASON FOR ASSESSMENT:    (Pressure Ulcer)    ASSESSMENT:   Pt admitted with Osteomyelitis of the right foot, s/p transmetatarsal amputation. Pt angiogram on 2/2 and scheduled for debridement 2/3. Pt on isolation.  Past Medical History  Diagnosis Date  . Hypertension   . Diabetes mellitus without complication ([place])   . Anemia   . Coronary artery disease   . CHF (congestive heart failure) ([place])   . Glaucoma   . Hyperlipidemia   . Neuropathy ([place])   . Chronic anemia   . Stroke ([place])   . Anginal pain ([place])   . Arthritis   . Retinopathy   . Cardiomyopathy ([place])   . Carotid artery stenosis   . Hoarseness, chronic   . GERD (gastroesophageal reflux disease)   . Hemangioma of liver   . Weight loss, unintentional   . DDD (degenerative disc disease), lumbar   . Osteomyelitis of foot, right, acute ([place])   . Chronic kidney disease     Stage 3     Diet Order:  Diet NPO time specified    Current Nutrition: Pt NPO   Food/Nutrition-Related History: Pt  NPO yesterday for angiogram. Recorded 100% of dinner 2/2. Per RN Dava pt with poor po intake.    Scheduled Medications:  . [MAR Hold] sodium chloride   Intravenous Once  . [MAR Hold] amLODipine  2.5 mg Oral Daily  . [MAR Hold] calcium-vitamin D  1 tablet Oral BID  . [MAR Hold] cloNIDine  0.2 mg Oral TID  . [MAR Hold] docusate  sodium  100 mg Oral BID  . [MAR Hold] ferrous sulfate  325 mg Oral Daily  . [MAR Hold] folic acid  1 mg Oral Daily  . [MAR Hold] heparin  5,000 Units Subcutaneous 3 times per day  . [MAR Hold] insulin aspart  0-9 Units Subcutaneous TID WC  . [MAR Hold] isosorbide dinitrate  30 mg Oral BID  . [MAR Hold] mirtazapine  15 mg Oral QHS  . [MAR Hold] pantoprazole  40 mg Oral Daily  . [MAR Hold] piperacillin-tazobactam  3.375 g Intravenous Q12H  . [MAR Hold] propranolol  40 mg Oral TID  . [MAR Hold] simvastatin  20 mg Oral QHS  . [place] Hold] vancomycin  1,000 mg Intravenous Q36H    Continuous Medications:  . dextrose 5 % and 0.9% NaCl 100 mL/hr at 06/07/15 1309     Electrolyte/Renal Profile and Glucose Profile:   Recent Labs Lab 06/05/15 0600 06/06/15 0427 06/07/15 0539  NA 139 138 138  K 4.8 4.7 4.5  CL 115* 114* 112*  CO2 19* 18* 22  BUN 66* 65* 62*  CREATININE 3.65* 3.50* 3.16*  CALCIUM 7.5* 7.3* 7.6*  GLUCOSE 131* 123* 153*   Protein Profile: No results for input(s): ALBUMIN in the last 168 hours.  Gastrointestinal Profile: Last BM:  06/04/2015   Nutrition-Focused Physical Exam Findings:  Unable to complete  Nutrition-Focused physical exam at this time.    Weight Change: Per [facility] weight encounters pt weight fluctuates   Skin:   Stage II sacral pressure ulcer   Height:   Ht Readings from Last 1 Encounters:  06/04/15 5\' 7"  (1.702 m)    Weight:   Wt Readings from Last 1 Encounters:  06/04/15 159 lb 11.2 oz (72.439 kg)   Wt Readings from Last 10 Encounters:  06/04/15 159 lb 11.2 oz (72.439 kg)  05/28/15 143 lb (64.864 kg)  05/23/15 151 lb (68.493 kg)  05/02/15 150 lb (68.04 kg)  04/08/15 169 lb 3.2 oz (76.749 kg)  03/04/15 141 lb 15.6 oz (64.4 kg)  03/01/15 135 lb (61.236 kg)  02/21/15 135 lb 9.3 oz (61.5 kg)  02/02/15 148 lb (67.132 kg)  01/31/15 145 lb (65.772 kg)    Ideal Body Weight:     BMI:  Body mass index is 25.01 kg/(m^2).  Estimated  Nutritional Needs:   Kcal:  BEE: 1273kcals, TEE: (IF 1.1-1.3)(AF 1.2) 1680-1985kcals  Protein:  86-108g protein (1.2-1.5g/kg)  Fluid:  1800-2145mL of fluid (25-40mL/kg)  EDUCATION NEEDS:   No education needs identified at this time   [place], RD, LDN Pager [phone] Weekend/On-Call Pager [phone]

## 2015-06-07 NOTE — Progress Notes (Signed)
[facility]  Daily Progress Note   Subjective  - 1 Day Post-Op  No events overnight. Not a lot of pain No fevers No problems after angiogram yesterday  Objective Filed Vitals:   06/06/15 2033 06/07/15 0300 06/07/15 0516 06/07/15 0723  BP: 147/58 148/62 161/59 158/54  Pulse: 59  66 64  Temp: 96.5 F (35.8 C)  97.5 F (36.4 C) 98.7 F (37.1 C)  TempSrc: Axillary  Oral Oral  Resp: 18  18 18   Height:      Weight:      SpO2: 97%  99% 97%    Intake/Output Summary (Last 24 hours) at 06/07/15 0836 Last data filed at 06/07/15 0630  Gross per 24 hour  Intake 1342.5 ml  Output    375 ml  Net  967.5 ml    PULM  CTAB CV  RRR VASC  Foot dressed, access site C/d/I  Laboratory CBC    Component Value Date/Time   WBC 7.4 06/07/2015 0539   WBC 4.2 05/05/2014 0108   HGB 8.7* 06/07/2015 0539   HGB 10.9* 05/05/2014 0108   HCT 26.3* 06/07/2015 0539   HCT 33.9* 05/05/2014 0108   PLT 151 06/07/2015 0539   PLT 219 05/05/2014 0108    BMET    Component Value Date/Time   NA 138 06/07/2015 0539   NA 146* 05/05/2014 0108   K 4.5 06/07/2015 0539   K 3.4* 05/05/2014 0108   CL 112* 06/07/2015 0539   CL 118* 05/05/2014 0108   CO2 22 06/07/2015 0539   CO2 21 05/05/2014 0108   GLUCOSE 153* 06/07/2015 0539   GLUCOSE 52* 05/05/2014 0108   BUN 62* 06/07/2015 0539   BUN 26* 05/05/2014 0108   CREATININE 3.16* 06/07/2015 0539   CREATININE 2.02* 05/05/2014 0108   CALCIUM 7.6* 06/07/2015 0539   CALCIUM 7.7* 05/05/2014 0108   GFRNONAA 14* 06/07/2015 0539   GFRNONAA 26* 05/05/2014 0108   GFRNONAA 28* 05/22/2013 0518   GFRAA 16* 06/07/2015 0539   GFRAA 31* 05/05/2014 0108   GFRAA 33* 05/22/2013 0518    Assessment/Planning: POD #1 s/p RLE angiogram with two tibial interventions   Has two vessel runoff to the foot.  Wound healing will still be quite difficult, but now has better flow.  Wound care/debridement per Podiatry  Will see in office in 2-3 weeks  with ABIs or sooner if needed     DEW,JASON  06/07/2015, 8:36 AM

## 2015-06-07 NOTE — Anesthesia Postprocedure Evaluation (Signed)
Anesthesia Post Note  Patient: [name]  Procedure(s) Performed: Procedure(s) (LRB): TRANSMETATARSAL AMPUTATION/ REVISION (Right) APPLICATION OF WOUND VAC (Right)  Patient location during evaluation: PACU Anesthesia Type: General Level of consciousness: awake and alert Pain management: pain level controlled Vital Signs Assessment: post-procedure vital signs reviewed and stable Respiratory status: spontaneous breathing and respiratory function stable Cardiovascular status: stable Anesthetic complications: no    Last Vitals:  Filed Vitals:   06/07/15 1055 06/07/15 1341  BP: 142/80 152/64  Pulse:  75  Temp:  36.8 C  Resp:  18    Last Pain:  Filed Vitals:   06/07/15 1351  PainSc: 4                  KEPHART,WILLIAM K

## 2015-06-07 NOTE — Progress Notes (Signed)
Esmolol 20mg  given for heart rate of 113

## 2015-06-07 NOTE — Anesthesia Procedure Notes (Signed)
Procedure Name: LMA Insertion Performed by: Elia Keenum Pre-anesthesia Checklist: Patient identified, Patient being monitored, Timeout performed, Emergency Drugs available and Suction available Patient Re-evaluated:Patient Re-evaluated prior to inductionOxygen Delivery Method: Circle system utilized Preoxygenation: Pre-oxygenation with 100% oxygen Intubation Type: IV induction Ventilation: Mask ventilation without difficulty LMA: LMA inserted LMA Size: 4.0 Tube type: Oral Number of attempts: 1 Placement Confirmation: positive ETCO2 and breath sounds checked- equal and bilateral Tube secured with: Tape Dental Injury: Teeth and Oropharynx as per pre-operative assessment        

## 2015-06-07 NOTE — Op Note (Signed)
Operative note   Surgeon:Jhase Creppel Lawyer: None    Preop diagnosis: Gangrene Right foot transmetatarsal irritation site    Postop diagnosis: Same    Procedure: Revision transmetatarsal amputation to Lisfranc amputation    EBL: 300 mL's    Anesthesia:general    Hemostasis: None    Specimen: Transected gangrenous transmetatarsal amputation site and wound cultures    Complications: None    Operative indications: 70 year old female with previous transmetatarsal amputation with residual gangrenous changes. She has undergone revascularization and presents today for attempted limb salvage. She understands he was high risk of undergoing below the knee amputation.    Procedure:  Patient was brought into the OR and placed on the operating table in thesupine position. After anesthesia was obtained theright lower extremity was prepped and draped in usual sterile fashion.  Attention was initially directed to the previous amputation site were markedly necrotic and gangrenous changes were noted along the incision site as well as into the plantar flap centrally. The site was then opened up and there was noted to be large amounts of hematoma and purulent drainage. There was noted necrosis within the radius amputation site. Full-thickness dissection of this area was performed. The plantar flap was revised removing a central portion of the plantar flap due to necrosis. Also dorsally to the midfoot necrotic tissue was removed to the level of the cuneiform region. At this time with a power saw the residual metatarsal regions were removed. In a residual metatarsals were removed with a scalpel blade at this time. All bleeders were Bovie cauterized. Further revision of the skin flap was performed. The necrotic tissue was excised with the use of scissors, scalpel blade, and Versa jet. The wound was then infiltrated with surgi-flow hemostatic agent. Closure was performed to the skin with a combination of  3-0 and 2-0 nylon. An incisional wound VAC within applied to the incision site.    Patient tolerated the procedure and anesthesia well.  Was transported from the OR to the PACU with all vital signs stable and vascular status intact. She will be transferred back to the floor for further evaluation.

## 2015-06-07 NOTE — Progress Notes (Signed)
ANTIBIOTIC CONSULT NOTE - Follow Up  Pharmacy Consult for vancomycin and piperacillin/tazobactam Indication: Osteomyelitis  Allergies  Allergen Reactions  . Codeine Other (See Comments)    Reaction:  Hallucinations   Patient Measurements: Height: 5\' 7"  (170.2 cm) Weight: 159 lb 11.2 oz (72.439 kg) IBW/kg (Calculated) : 61.6 Adjusted Body Weight: n/a  Vital Signs: Temp: 98.7 F (37.1 C) (02/03 0723) Temp Source: Oral (02/03 0723) BP: 158/54 mmHg (02/03 0723) Pulse Rate: 64 (02/03 0723) Intake/Output from previous day: 02/02 0701 - 02/03 0700 In: 2113.5 [I.V.:1573.5; Blood:240; IV Piggyback:300] Out: 375 [Urine:375] Intake/Output from this shift:    Labs:  Recent Labs  06/05/15 0600 06/06/15 0427 06/06/15 1702 06/07/15 0539  WBC 4.3 3.5*  --  7.4  HGB 7.7* 6.9* 8.3* 8.7*  PLT 138* 125*  --  151  CREATININE 3.65* 3.50*  --  3.16*   Estimated Creatinine Clearance: 16.1 mL/min (by C-G formula based on Cr of 3.16).  Recent Labs  06/06/15 2035  [facility] 8     Assessment: Pharmacy consulted to dose vancomycin and piperacillin/tazobactam for osteomyelitis in this 70 year old female. Patient has a current CrCl of ~13.9 mL/min due to acute renal failure. Patient recently underwent a transmetatarsal amputation and was discharged with piperacillin/tazobactam and PO SMX/TMP to a nursing home last week. Patient returned to hospital due to possible gangrene at amputation site. ID consulted.  Use actual body weight of 72.4 kg  Kinetics: Ke: 0.018 Half-life: 38.5 hours Vd: 50.4 L   2/2 VT ~48h after first dose: 8 mcg/ml   Goal of Therapy:  Vancomycin trough level 15-20 mcg/ml  Plan:   1. Continue Zosyn 3.375 gm IV q12h per EI protocol based on renal function.  2. Continue Vancomycin 1 gm IV q36h.  Renal function slightly improved.  Recheck trough on 2/4 at 0930 to help guide dosing as renal function is unstable.   Loleta Dicker, PharmD Clinical  Pharmacist 06/07/2015,10:47 AM

## 2015-06-07 NOTE — Anesthesia Preprocedure Evaluation (Signed)
Anesthesia Evaluation  Patient identified by MRN, date of birth, ID band Patient awake    Reviewed: Allergy & Precautions, NPO status , Patient's Chart, lab work & pertinent test results, reviewed documented beta blocker date and time   Airway Mallampati: II  TM Distance: >3 FB     Dental  (+) Chipped   Pulmonary           Cardiovascular hypertension, Pt. on medications and Pt. on home beta blockers + angina + CAD, + Peripheral Vascular Disease and +CHF       Neuro/Psych CVA    GI/Hepatic GERD  Controlled,  Endo/Other  diabetes  Renal/GU Renal disease     Musculoskeletal  (+) Arthritis ,   Abdominal   Peds  Hematology  (+) anemia ,   Anesthesia Other Findings Neuropathy. [place] II. Hx of subdural hematoma. Gangrene.  Reproductive/Obstetrics                             Anesthesia Physical Anesthesia Plan  ASA: III  Anesthesia Plan: General   Post-op Pain Management:    Induction: Intravenous  Airway Management Planned: Oral ETT  Additional Equipment:   Intra-op Plan:   Post-operative Plan:   Informed Consent: I have reviewed the patients History and Physical, chart, labs and discussed the procedure including the risks, benefits and alternatives for the proposed anesthesia with the patient or authorized representative who has indicated his/her understanding and acceptance.     Plan Discussed with: CRNA  Anesthesia Plan Comments:         Anesthesia Quick Evaluation

## 2015-06-07 NOTE — Progress Notes (Signed)
Per RN in progression rounds patient is going to surgery today and will not be ready for D/C over the weekend. Clinical Social Worker (SCW) made [facility] liaision aware of above. CSW will continue to follow and assist as needed.   Blima Rich, LCSW [phone]

## 2015-06-08 LAB — BASIC METABOLIC PANEL
Anion gap: 5 (ref 5–15)
BUN: 53 mg/dL — ABNORMAL HIGH (ref 6–20)
CO2: 19 mmol/L — ABNORMAL LOW (ref 22–32)
Calcium: 6.7 mg/dL — ABNORMAL LOW (ref 8.9–10.3)
Chloride: 116 mmol/L — ABNORMAL HIGH (ref 101–111)
Creatinine, Ser: 2.75 mg/dL — ABNORMAL HIGH (ref 0.44–1.00)
GFR calc Af Amer: 19 mL/min — ABNORMAL LOW (ref >60)
GFR calc non Af Amer: 16 mL/min — ABNORMAL LOW (ref >60)
Glucose, Bld: 136 mg/dL — ABNORMAL HIGH (ref 65–99)
Potassium: 4 mmol/L (ref 3.5–5.1)
Sodium: 140 mmol/L (ref 135–145)

## 2015-06-08 LAB — GLUCOSE, CAPILLARY
GLUCOSE-CAPILLARY: 157 mg/dL — AB (ref 65–99)
GLUCOSE-CAPILLARY: 120 mg/dL — AB (ref 65–99)
GLUCOSE-CAPILLARY: 120 mg/dL — AB (ref 65–99)
Glucose-Capillary: 138 mg/dL — ABNORMAL HIGH (ref 65–99)
Glucose-Capillary: 122 mg/dL — ABNORMAL HIGH (ref 65–99)

## 2015-06-08 MED ORDER — SODIUM CHLORIDE 0.9 % IV SOLN
INTRAVENOUS | Status: AC
  Administered 2015-06-08 – 2015-06-09 (×2): via INTRAVENOUS

## 2015-06-08 NOTE — Progress Notes (Signed)
[place] [facility] [place] NAME: [name]    MR#:  [id number]  DATE OF BIRTH:  Sep 22, 1945  SUBJECTIVE:   Patient without complaints this morning. She has wound VAC place  REVIEW OF SYSTEMS:    Review of Systems  Constitutional: Negative for fever, chills and malaise/fatigue.  HENT: Negative for sore throat.   Eyes: Negative for blurred vision.  Respiratory: Negative for cough, hemoptysis, shortness of breath and wheezing.   Cardiovascular: Negative for chest pain, palpitations and leg swelling.  Gastrointestinal: Negative for nausea, vomiting, abdominal pain, diarrhea and blood in stool.  Genitourinary: Negative for dysuria.  Musculoskeletal: Negative for back pain.  Neurological: Negative for dizziness, tremors and headaches.  Endo/Heme/Allergies: Does not bruise/bleed easily.    Tolerating Diet:yes      DRUG ALLERGIES:   Allergies  Allergen Reactions  . Codeine Other (See Comments)    Reaction:  Hallucinations    VITALS:  Blood pressure 136/44, pulse 65, temperature 98.4 F (36.9 C), temperature source Oral, resp. rate 20, height 5\' 7"  (1.702 m), weight 72.439 kg (159 lb 11.2 oz), SpO2 90 %.  PHYSICAL EXAMINATION:   Physical Exam  Constitutional: She is oriented to person, place, and time and well-developed, well-nourished, and in no distress. No distress.  HENT:  Head: Normocephalic.  Eyes: No scleral icterus.  Neck: Normal range of motion. Neck supple. No JVD present. No tracheal deviation present.  Cardiovascular: Normal rate, regular rhythm and normal heart sounds.  Exam reveals no gallop and no friction rub.   No murmur heard. Pulmonary/Chest: Effort normal and breath sounds normal. No respiratory distress. She has no wheezes. She has no rales. She exhibits no tenderness.  Abdominal: Soft. Bowel sounds are normal. She exhibits no distension and no mass. There is no tenderness. There is no rebound and no  guarding.  Musculoskeletal: Normal range of motion. She exhibits no edema.  Wound vac placed  Neurological: She is alert and oriented to person, place, and time.  Skin: Skin is warm. No erythema.  Psychiatric: Affect and judgment normal.      LABORATORY PANEL:   CBC  Recent Labs Lab 06/07/15 0539  WBC 7.4  HGB 8.7*  HCT 26.3*  PLT 151   ------------------------------------------------------------------------------------------------------------------  Chemistries   Recent Labs Lab 06/07/15 0539  NA 138  K 4.5  CL 112*  CO2 22  GLUCOSE 153*  BUN 62*  CREATININE 3.16*  CALCIUM 7.6*   ------------------------------------------------------------------------------------------------------------------  Cardiac Enzymes No results for input(s): TROPONINI in the last 168 hours. ------------------------------------------------------------------------------------------------------------------  RADIOLOGY:  No results found.   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  1. Osteomyelitis right foot: Patient was on IV Zosyn and oral Bactrim at discharge. She has PICC line placed from last hospitalization. She is postoperative day #1 for transmetatarsal amputation revision. Continue wound VAC. Continue Zosyn and vancomycin with ID follow-up after discharge.  2. Acute on chronic renal failure stage III: Creatinine was not checked this morning.  Her baseline is around 1.7-2. Hold nephrotoxic agents. This is medication induced from ACE inhibitor, Bactrim and Lasix. 3. Diabetes: Continue sliding scale insulin. 4 Essential hypertension: Continue clonidine, isosorbide, propanolol  5. Hyperlipidemia: Continue simvastatin  6. History of CAD: Continue  isosorbide, simvastatin and propanolol.  7.  Anemia of chronic disease and iron deficiency: Continue ferrous sulfate and folic acid.  Was 1 unit PRBC      Management plans discussed with the patient and she is in agreement.  CODE STATUS: FULL  TOTAL TIME TAKING CARE OF THIS PATIENT: 30 minutes.     POSSIBLE D/C tomorrow, DEPENDING ON CLINICAL CONDITION.   [name] M.D on 06/08/2015 at 10:42 AM  Between 7am to 6pm - Pager - [phone] After 6pm go to [email or web address] - password [email or web address] [facility]  Office  [phone]  CC: Primary care physician; [facility]  Note: This dictation was prepared with Dragon dictation along with smaller phrase technology. Any transcriptional errors that result from this process are unintentional.

## 2015-06-08 NOTE — Progress Notes (Signed)
Daily Progress Note   Subjective  - 1 Day Post-Op  S/p lisfranc amputation right foot.  No complaints overnight  Objective Filed Vitals:   06/07/15 1726 06/07/15 1758 06/07/15 1857 06/07/15 2044  BP: 131/52 112/54 112/50 108/56  Pulse: 71 71 71 69  Temp: 98.3 F (36.8 C) 98.2 F (36.8 C) 98 F (36.7 C) 99 F (37.2 C)  TempSrc:  Oral Oral Oral  Resp: 18 18 18 19   Height:      Weight:      SpO2: 98% 98% 96% 99%    Physical Exam: Wound vac in place.  Small canister full and to be changed.  Laboratory CBC    Component Value Date/Time   WBC 7.4 06/07/2015 0539   WBC 4.2 05/05/2014 0108   HGB 8.7* 06/07/2015 0539   HGB 10.9* 05/05/2014 0108   HCT 26.3* 06/07/2015 0539   HCT 33.9* 05/05/2014 0108   PLT 151 06/07/2015 0539   PLT 219 05/05/2014 0108    BMET    Component Value Date/Time   NA 138 06/07/2015 0539   NA 146* 05/05/2014 0108   K 4.5 06/07/2015 0539   K 3.4* 05/05/2014 0108   CL 112* 06/07/2015 0539   CL 118* 05/05/2014 0108   CO2 22 06/07/2015 0539   CO2 21 05/05/2014 0108   GLUCOSE 153* 06/07/2015 0539   GLUCOSE 52* 05/05/2014 0108   BUN 62* 06/07/2015 0539   BUN 26* 05/05/2014 0108   CREATININE 3.16* 06/07/2015 0539   CREATININE 2.02* 05/05/2014 0108   CALCIUM 7.6* 06/07/2015 0539   CALCIUM 7.7* 05/05/2014 0108   GFRNONAA 14* 06/07/2015 0539   GFRNONAA 26* 05/05/2014 0108   GFRNONAA 28* 05/22/2013 0518   GFRAA 16* 06/07/2015 0539   GFRAA 31* 05/05/2014 0108   GFRAA 33* 05/22/2013 0518    Assessment/Planning: Once medically stable can likely be d/c'd and wound vac maintained at SNF.   C/W previous IV abx.  Intra-op cultures taken.   Pt understands still at high risk of amputation.  Will f/u tomorrow.    [name] A  06/08/2015, 7:35 AM

## 2015-06-09 LAB — CBC
HCT: 20.4 % — ABNORMAL LOW (ref 35.0–47.0)
HEMOGLOBIN: 6.6 g/dL — AB (ref 12.0–16.0)
MCH: 28.3 pg (ref 26.0–34.0)
MCHC: 32.1 g/dL (ref 32.0–36.0)
MCV: 88 fL (ref 80.0–100.0)
PLATELETS: 140 10*3/uL — AB (ref 150–440)
RBC: 2.32 MIL/uL — ABNORMAL LOW (ref 3.80–5.20)
RDW: 18.6 % — AB (ref 11.5–14.5)
WBC: 7.7 10*3/uL (ref 3.6–11.0)

## 2015-06-09 LAB — GLUCOSE, CAPILLARY
GLUCOSE-CAPILLARY: 122 mg/dL — AB (ref 65–99)
GLUCOSE-CAPILLARY: 137 mg/dL — AB (ref 65–99)
Glucose-Capillary: 122 mg/dL — ABNORMAL HIGH (ref 65–99)
Glucose-Capillary: 120 mg/dL — ABNORMAL HIGH (ref 65–99)

## 2015-06-09 LAB — BASIC METABOLIC PANEL
Anion gap: 8 (ref 5–15)
BUN: 56 mg/dL — AB (ref 6–20)
CALCIUM: 7.6 mg/dL — AB (ref 8.9–10.3)
CHLORIDE: 112 mmol/L — AB (ref 101–111)
CO2: 20 mmol/L — ABNORMAL LOW (ref 22–32)
CREATININE: 2.85 mg/dL — AB (ref 0.44–1.00)
GFR, EST AFRICAN AMERICAN: 18 mL/min — AB (ref >60)
GFR, EST NON AFRICAN AMERICAN: 16 mL/min — AB (ref >60)
GLUCOSE: 129 mg/dL — AB (ref 65–99)
POTASSIUM: 4.3 mmol/L (ref 3.5–5.1)
SODIUM: 140 mmol/L (ref 135–145)

## 2015-06-09 LAB — HEMOGLOBIN: HEMOGLOBIN: 8.5 g/dL — AB (ref 12.0–16.0)

## 2015-06-09 LAB — PREPARE RBC (CROSSMATCH)

## 2015-06-09 LAB — HEMATOCRIT: HEMATOCRIT: 25.8 % — AB (ref 35.0–47.0)

## 2015-06-09 LAB — IRON AND TIBC: IRON: 23 ug/dL — AB (ref 28–170)

## 2015-06-09 LAB — RETICULOCYTES
RBC.: 2.27 MIL/uL — ABNORMAL LOW (ref 3.80–5.20)
RETIC COUNT ABSOLUTE: 54.5 10*3/uL (ref 19.0–183.0)
Retic Ct Pct: 2.4 % (ref 0.4–3.1)

## 2015-06-09 LAB — VANCOMYCIN, TROUGH: VANCOMYCIN TR: 18 ug/mL (ref 10–20)

## 2015-06-09 LAB — FOLATE: FOLATE: 20.1 ng/mL (ref >5.9)

## 2015-06-09 LAB — FERRITIN: FERRITIN: 562 ng/mL — AB (ref 11–307)

## 2015-06-09 LAB — VITAMIN B12: Vitamin B-12: 899 pg/mL (ref 180–914)

## 2015-06-09 MED ORDER — SODIUM CHLORIDE 0.9 % IV SOLN
Freq: Once | INTRAVENOUS | Status: AC
  Administered 2015-06-09: 11:00:00 via INTRAVENOUS

## 2015-06-09 MED ORDER — AMLODIPINE BESYLATE 10 MG PO TABS
10.0000 mg | ORAL_TABLET | Freq: Every day | ORAL | Status: DC
  Administered 2015-06-09 – 2015-06-10 (×2): 10 mg via ORAL
  Filled 2015-06-09 (×2): qty 1

## 2015-06-09 MED ORDER — FUROSEMIDE 10 MG/ML IJ SOLN
20.0000 mg | Freq: Once | INTRAMUSCULAR | Status: AC
  Administered 2015-06-09: 20 mg via INTRAVENOUS
  Filled 2015-06-09: qty 2

## 2015-06-09 MED ORDER — ACETAMINOPHEN 325 MG PO TABS
650.0000 mg | ORAL_TABLET | Freq: Once | ORAL | Status: AC
  Administered 2015-06-09: 650 mg via ORAL
  Filled 2015-06-09: qty 2

## 2015-06-09 MED ORDER — SODIUM CHLORIDE 0.9 % IV SOLN
Freq: Once | INTRAVENOUS | Status: AC
  Administered 2015-06-09: 16:00:00 via INTRAVENOUS

## 2015-06-09 NOTE — Plan of Care (Signed)
Problem: Pain Managment: Goal: General experience of comfort will improve Outcome: Progressing Pt with c/o pain 7/10 to amp site, percocet given prn with relief.   Problem: Skin Integrity: Goal: Risk for impaired skin integrity will decrease Outcome: Progressing [place] in place to amp site, small amt of S/S drainage. Pink foam applied to left groin angio site, intact puncture site.  Problem: Tissue Perfusion: Goal: Risk factors for ineffective tissue perfusion will decrease Outcome: Progressing Sub-q heparin given per md order. SCD applied.  Problem: Activity: Goal: Ability to perform//tolerate increased activity and mobilize with assistive devices will improve Outcome: Not Progressing Pt with active ROM in bed, need to consult PT for oob activity. Attempt OOB to chair in AM. Goal: Will remain free from falls Outcome: Progressing Pt remains in bed over night.

## 2015-06-09 NOTE — Progress Notes (Signed)
Subjective:  Patient known to our practice from outpatient. She was followed by Dr. Holley Raring and was last to follow-up. She was last seen in 2015. She underwent right foot transmetatarsal revision on February 3 Nephrology consult has been requested for acute renal failure Baseline creatinine appears to be 1.35 from 05/23/2015 corresponding to GFR of 45 Admit creatinine was 1.7 and since then it has been worsening . It peaked at 3.77 Today's creatinine is slightly better at 2.85 Potassium is normal  bicarbonate is slightly low but acceptable   patient is able to eat okay. No nausea or vomiting. Appetite is poor   no shortness of breath Currently she is getting blood transfusion   Objective:  Vital signs in last 24 hours:  Temp:  [98.1 F (36.7 C)-99.2 F (37.3 C)] 98.1 F (36.7 C) (02/05 1125) Pulse Rate:  [63-74] 64 (02/05 1125) Resp:  [18] 18 (02/05 1125) BP: (120-185)/(42-63) 120/53 mmHg (02/05 1125) SpO2:  [90 %-98 %] 98 % (02/05 1125)  Weight change:  Filed Weights   06/04/15 1030  Weight: 72.439 kg (159 lb 11.2 oz)    Intake/Output:    Intake/Output Summary (Last 24 hours) at 06/09/15 1206 Last data filed at 06/09/15 0932  Gross per 24 hour  Intake 1513.75 ml  Output    300 ml  Net 1213.75 ml     Physical Exam: General:  no acute distress, laying in the bed   HEENT  anicteric, moist oral mucous membranes   Neck  supple   Pulm/lungs  normal respiratory effort, clear to auscultation   CVS/Heart  regular rhythm,   Abdomen:   soft, nontender   Extremities:  right foot in bandage   Neurologic:  alert, oriented   Skin:  normal mood and affect   Access:        Basic Metabolic Panel:   Recent Labs Lab 06/05/15 0600 06/06/15 0427 06/07/15 0539 06/08/15 1528 06/09/15 0501  NA 139 138 138 140 140  K 4.8 4.7 4.5 4.0 4.3  CL 115* 114* 112* 116* 112*  CO2 19* 18* 22 19* 20*  GLUCOSE 131* 123* 153* 136* 129*  BUN 66* 65* 62* 53* 56*  CREATININE 3.65*  3.50* 3.16* 2.75* 2.85*  CALCIUM 7.5* 7.3* 7.6* 6.7* 7.6*     CBC:  Recent Labs Lab 06/04/15 1220 06/05/15 0600 06/06/15 0427 06/06/15 1702 06/07/15 0539 06/09/15 0501  WBC 5.0 4.3 3.5*  --  7.4 7.7  NEUTROABS  --   --  2.3  --   --   --   HGB 7.9* 7.7* 6.9* 8.3* 8.7* 6.6*  HCT 24.0* 23.9* 21.3* 25.3* 26.3* 20.4*  MCV 88.1 89.2 87.7  --  87.4 88.0  PLT 136* 138* 125*  --  151 140*      Microbiology:  Recent Results (from the past 720 hour(s))  Wound culture     Status: None   Collection Time: 05/20/15 10:15 AM  Result Value Ref Range Status   Specimen Description FOOT  Final   Special Requests NONE  Final   Gram Stain   Final    FEW WBC SEEN FEW GRAM NEGATIVE RODS RARE GRAM POSITIVE COCCI IN PAIRS    Culture   Final    LIGHT GROWTH ENTEROBACTER CLOACAE MODERATE GROWTH STENOTROPHOMONAS [place] ENTEROCOCCUS FAECIUM    Report Status 05/23/2015 FINAL  Final   Organism ID, Bacteria STENOTROPHOMONAS MALTOPHILIA  Final   Organism ID, Bacteria ENTEROCOCCUS FAECIUM  Final   Organism ID,  Bacteria ENTEROBACTER CLOACAE  Final      Susceptibility   Enterobacter cloacae - MIC*    PIP/TAZO Value in next row Sensitive      SENSITIVE<=4    CEFTAZIDIME Value in next row Sensitive      SENSITIVE<=1    CEFTRIAXONE Value in next row Sensitive      SENSITIVE<=1    IMIPENEM Value in next row Sensitive      SENSITIVE<=0.25    GENTAMICIN Value in next row Sensitive      SENSITIVE<=1    CIPROFLOXACIN Value in next row Sensitive      SENSITIVE<=0.25    NITROFURANTOIN Value in next row Sensitive      SENSITIVE32    TRIMETH/SULFA Value in next row Sensitive      SENSITIVE<=20    * LIGHT GROWTH ENTEROBACTER CLOACAE   Stenotrophomonas maltophilia - MIC*    LEVOFLOXACIN Value in next row Resistant      SENSITIVE<=20    TRIMETH/SULFA Value in next row Sensitive      SENSITIVE<=20    * MODERATE GROWTH STENOTROPHOMONAS MALTOPHILIA   Enterococcus faecium - MIC*     AMPICILLIN Value in next row Resistant      SENSITIVE<=20    GENTAMICIN SYNERGY Value in next row Sensitive      SENSITIVE<=20    * LIGHT GROWTH ENTEROCOCCUS FAECIUM  Anaerobic culture     Status: None   Collection Time: 05/20/15 10:15 AM  Result Value Ref Range Status   Specimen Description FOOT  Final   Special Requests NONE  Final   Culture NO ANAEROBES ISOLATED  Final   Report Status 05/24/2015 FINAL  Final  Surgical pcr screen     Status: None   Collection Time: 05/23/15 11:57 AM  Result Value Ref Range Status   MRSA, PCR NEGATIVE NEGATIVE Final   Staphylococcus aureus NEGATIVE NEGATIVE Final    Comment:        The Xpert SA Assay (FDA approved for NASAL specimens in patients over 13 years of age), is one component of a comprehensive surveillance program.  Test performance has been validated by [facility] for patients greater than or equal to 82 year old. It is not intended to diagnose infection nor to guide or monitor treatment.   Surgical pcr screen     Status: None   Collection Time: 05/27/15  3:41 PM  Result Value Ref Range Status   MRSA, PCR NEGATIVE NEGATIVE Final   Staphylococcus aureus NEGATIVE NEGATIVE Final    Comment:        The Xpert SA Assay (FDA approved for NASAL specimens in patients over 74 years of age), is one component of a comprehensive surveillance program.  Test performance has been validated by [facility] for patients greater than or equal to 56 year old. It is not intended to diagnose infection nor to guide or monitor treatment.   Wound culture     Status: None (Preliminary result)   Collection Time: 06/04/15  8:36 AM  Result Value Ref Range Status   Specimen Description FOOT  Final   Special Requests NONE  Final   Gram Stain   Final    RARE WBC SEEN FEW RED BLOOD CELLS NO ORGANISMS SEEN    Culture   Final    LIGHT GROWTH ENTEROCOCCUS FAECIUM RARE GROWTH STAPHYLOCOCCUS AUREUS SUSCEPTIBILITIES TO FOLLOW    Report Status  PENDING  Incomplete   Organism ID, Bacteria ENTEROCOCCUS FAECIUM  Final  Susceptibility   Enterococcus faecium - MIC*    AMPICILLIN >=32 RESISTANT Resistant     LINEZOLID 2 SENSITIVE Sensitive     * LIGHT GROWTH ENTEROCOCCUS FAECIUM  Anaerobic culture     Status: None   Collection Time: 06/04/15  8:36 AM  Result Value Ref Range Status   Specimen Description FOOT  Final   Special Requests NONE  Final   Culture NO ANAEROBES ISOLATED  Final   Report Status 06/07/2015 FINAL  Final  Surgical pcr screen     Status: None   Collection Time: 06/04/15 10:12 AM  Result Value Ref Range Status   MRSA, PCR NEGATIVE NEGATIVE Final   Staphylococcus aureus NEGATIVE NEGATIVE Final    Comment:        The Xpert SA Assay (FDA approved for NASAL specimens in patients over 21 years of age), is one component of a comprehensive surveillance program.  Test performance has been validated by [facility] for patients greater than or equal to 28 year old. It is not intended to diagnose infection nor to guide or monitor treatment.   Wound culture     Status: None (Preliminary result)   Collection Time: 06/07/15  3:00 PM  Result Value Ref Range Status   Specimen Description WOUND  Final   Special Requests NONE  Final   Gram Stain RARE WBC SEEN RARE GRAM POSITIVE COCCI   Final   Culture HOLDING FOR POSSIBLE PATHOGEN  Final   Report Status PENDING  Incomplete    Coagulation Studies: No results for input(s): LABPROT, INR in the last 72 hours.  Urinalysis: No results for input(s): COLORURINE, LABSPEC, PHURINE, GLUCOSEU, HGBUR, BILIRUBINUR, KETONESUR, PROTEINUR, UROBILINOGEN, NITRITE, LEUKOCYTESUR in the last 72 hours.  Invalid input(s): APPERANCEUR    Imaging: No results found.   Medications:     . sodium chloride   Intravenous Once  . sodium chloride   Intravenous Once  . sodium chloride   Intravenous Once  . acetaminophen  650 mg Oral Once  . amLODipine  10 mg Oral Daily  .  calcium-vitamin D  1 tablet Oral BID  . cloNIDine  0.2 mg Oral TID  . docusate sodium  100 mg Oral BID  . ferrous sulfate  325 mg Oral Daily  . folic acid  1 mg Oral Daily  . furosemide  20 mg Intravenous Once  . heparin  5,000 Units Subcutaneous 3 times per day  . insulin aspart  0-9 Units Subcutaneous TID WC  . isosorbide dinitrate  30 mg Oral BID  . mirtazapine  15 mg Oral QHS  . pantoprazole  40 mg Oral Daily  . piperacillin-tazobactam  3.375 g Intravenous Q12H  . propranolol  40 mg Oral TID  . simvastatin  20 mg Oral QHS  . vancomycin  1,000 mg Intravenous Q36H   acetaminophen, nitroGLYCERIN, oxyCODONE-acetaminophen, traMADol  Assessment/ Plan:  70 y.o. African-American  female With history of diabetes with retinopathy and nephropathy, proteinuria, hypertension, congestive heart failure, coronary disease with PCI, left renal injury or myolipoma, hepatic hemangioma, chronic kidney disease stage III   1. Acute kidney injury on chronic kidney disease stage III. Baseline creatinine 1.3/GFR 45 - Acute renal failure is likely secondary to concurrent infection - Continue to treat as you're doing - Pharmacy to assist with renal dosing - Avoid nephrotoxins including nonsteroidals, aminoglycosides, IV contrast - Maintain good blood pressure control; avoid hypotension - Agree with 1 dose of Lasix in between the transfusion but otherwise avoid diuretics unless  fluid overload  2. Diabetes type 2 with chronic kidney disease - No recent hemoglobin A1c available. It was 5.9 in September 2016  3. Hypertension Current regimen includes clonidine and amlodipine - We will consider adding ACE inhibitor once acute issues are resolved   LOS: 5 Abbagayle Zaragoza 2/5/201712:06 PM

## 2015-06-09 NOTE — Progress Notes (Signed)
Right foot trans-met revision. H/H low and given 1 unit. Foot is doing well.  No necrosis POD #2. Dressing changed and no infection. Pt needs to be strict NWB to foot. Recommend daily dry dressing changes upon discharge. IV abx per ID. From podiatry standpoint pt ok for d/c with above dressings. F/U with Podiatry in 1 week.

## 2015-06-09 NOTE — NC FL2 (Deleted)
[place] LEVEL OF CARE SCREENING TOOL     IDENTIFICATION  Patient Name: [name] Birthdate: June 17, 1945 Sex: female Admission Date (Current Location): 06/04/2015  [place] and [place] Number:  Engineering geologist and Address:  [facility], [address], [place], [place] [address]      Provider Number: [id number]  Attending Physician Name and Address:  Bettey Costa, MD  Relative Name and Phone Number:       Current Level of Care: Hospital Recommended Level of Care: [place] Prior Approval Number:    Date Approved/Denied:   PASRR Number:  ([id number])  Discharge Plan: SNF    Current Diagnoses: Patient Active Problem List   Diagnosis Date Noted  . Sacral pressure ulcer 06/05/2015  . Gangrene of foot ([place]) 06/04/2015  . Osteomyelitis ([place]) 05/27/2015  . Diabetic osteomyelitis ([facility]) 04/02/2015  . Osteomyelitis of ankle or foot ([place]) 04/02/2015  . Weight loss 02/21/2015  . Folate deficiency 02/21/2015  . Cellulitis of great toe of right foot 02/02/2015  . Diabetes mellitus ([place]) 02/02/2015  . CKD (chronic kidney disease), stage III 02/02/2015  . Anemia of chronic disease 02/02/2015  . Anemia 01/30/2015  . Infected wound ([place]) 01/29/2015  . Subdural hematoma ([place]) 12/06/2014  . Pressure ulcer 12/05/2014  . SDH (subdural hematoma) ([place]) 12/03/2014  . Chronic diastolic heart failure ([place]) 11/02/2014  . Melena 10/02/2014  . DM (diabetes mellitus) ([place]) 02/04/2010  . Deficiency anemia 02/04/2010  . Essential hypertension 02/04/2010  . Coronary atherosclerosis 02/04/2010    Orientation RESPIRATION BLADDER Height & Weight     Self, Time, Place, Situation  Normal Continent Weight: 159 lb 11.2 oz (72.439 kg) Height:  5\' 7"  (170.2 cm)  BEHAVIORAL SYMPTOMS/MOOD NEUROLOGICAL BOWEL NUTRITION STATUS   (none )  (none ) Continent Diet (Carb modified)  AMBULATORY STATUS COMMUNICATION OF NEEDS Skin   Extensive  Assist Verbally Surgical wounds                       Personal Care Assistance Level of Assistance  Bathing, Feeding, Dressing Bathing Assistance: Limited assistance Feeding assistance: Independent Dressing Assistance: Limited assistance     Functional Limitations Info  Sight, Hearing, Speech Sight Info: Adequate Hearing Info: Adequate Speech Info: Adequate    SPECIAL CARE FACTORS FREQUENCY  PT (By licensed PT)     PT Frequency: 5X week OT Frequency:  (5)            Contractures      Additional Factors Info  Allergies, Isolation Precautions Code Status Info:  (Full Code. 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 0.9 %  sodium chloride infusion   Intravenous Once Srikar Sudini, MD      . acetaminophen (TYLENOL) tablet 650 mg  650 mg Oral Q4H PRN Vaughan Basta, MD      . amLODipine (NORVASC) tablet 10 mg  10 mg Oral Daily Bettey Costa, MD   10 mg at 06/09/15 0907  . calcium-vitamin D (OSCAL WITH D) 500-200 MG-UNIT per tablet 1 tablet  1 tablet Oral BID Vaughan Basta, MD   1 tablet at 06/09/15 0907  . cloNIDine (CATAPRES) tablet 0.2 mg  0.2 mg Oral TID Vaughan Basta, MD   0.2 mg at 06/09/15 1621  . docusate sodium (COLACE) capsule 100 mg  100 mg Oral BID Vaughan Basta, MD   100 mg at 06/09/15 0907  . ferrous sulfate tablet 325 mg  325 mg Oral Daily Vaughan Basta, MD   325 mg at 06/09/15 0907  . folic acid (FOLVITE) tablet 1 mg  1 mg Oral Daily Vaughan Basta, MD   1 mg at 06/09/15 0907  . heparin injection 5,000 Units  5,000 Units Subcutaneous 3 times per day Vaughan Basta, MD   5,000 Units at  06/09/15 1404  . insulin aspart (novoLOG) injection 0-9 Units  0-9 Units Subcutaneous TID WC Vaughan Basta, MD   1 Units at 06/09/15 1621  . isosorbide dinitrate (ISORDIL) tablet 30 mg  30 mg Oral BID Vaughan Basta, MD   30 mg at 06/09/15 0906  . mirtazapine (REMERON) tablet 15 mg  15 mg Oral QHS Vaughan Basta, MD   15 mg at 06/08/15 2206  . nitroGLYCERIN (NITROSTAT) SL tablet 0.4 mg  0.4 mg Sublingual Q5 min PRN Vaughan Basta, MD      . oxyCODONE-acetaminophen (PERCOCET/ROXICET) 5-325 MG per tablet 1-2 tablet  1-2 tablet Oral Q4H PRN Vaughan Basta, MD   2 tablet at 06/08/15 2228  . pantoprazole (PROTONIX) EC tablet 40 mg  40 mg Oral Daily Vaughan Basta, MD   40 mg at 06/09/15 0907  . piperacillin-tazobactam (ZOSYN) IVPB 3.375 g  3.375 g Intravenous Q12H [name], RPH   3.375 g at 06/09/15 0907  . propranolol (INDERAL) tablet 40 mg  40 mg Oral TID Vaughan Basta, MD   40 mg at 06/09/15 1620  . simvastatin (ZOCOR) tablet 20 mg  20 mg Oral QHS Vaughan Basta, MD   20 mg at 06/08/15 2207  . traMADol (ULTRAM) tablet 25-50 mg  25-50 mg Oral TID PRN Vaughan Basta, MD      . vancomycin (VANCOCIN) IVPB 1000 mg/200 mL premix  1,000 mg Intravenous Q36H Hillary Bow, MD   1,000 mg at 06/08/15 0805     Discharge Medications: Please see discharge summary for a list of discharge medications.  Relevant Imaging Results:  Relevant Lab Results:   Additional Information SSN: [id number]  Micah Flesher, LCSW

## 2015-06-09 NOTE — Clinical Social Work Note (Deleted)
Clinical Social Work Assessment  Patient Details  Name: [name] MRN: [id number] Date of Birth: 1945-06-22  Date of referral:  06/04/15               Reason for consult:  Facility Placement, Other (Comment Required) (From [facility] (SNF STR) )                Permission sought to share information with:  Chartered certified accountant granted to share information::  Yes, Verbal Permission Granted  Name::        Agency::     Relationship::     Contact Information:     Housing/Transportation Living arrangements for the past 2 months:  [place], [place] of Information:  Patient, Spouse, Facility, Other (Comment Required) (Sisters ) Patient Interpreter Needed:  None Criminal Activity/Legal Involvement Pertinent to Current Situation/Hospitalization:  No - Comment as needed Significant Relationships:  Adult Children, Siblings, Spouse Lives with:  Spouse, Facility Resident Do you feel safe going back to the place where you live?  Yes Need for family participation in patient care:  Yes (Comment)  Care giving concerns:  No concerns at this time   Facilities manager / plan: CSW received consult for new SNF placement. CSW met with patient at bedside. Patient was alert and oriented and was lying in bed. Patient reported that she had right and left toe removed. Patient states she currently lives at home alone with one dog. She has social support from her friend Sinclair Grooms. Patient has agreed to d/c to SNF. Patient states she has been at a SNF before in [place] for about nine days. Patient is requesting [facility], [facility] and [facility] as top choices for placement. Patient currently resides in [place]. CSW will complete FL2 and fax to facilities for SNF placement.   Employment status:  Retired Nurse, adult PT Recommendations:  Not assessed at this time [place] / Referral to community resources:  [place]  Patient/Family's Response to care:  Patient was appreciative with talking to CSW  Patient/Family's Understanding of and Emotional Response to Diagnosis, Current Treatment, and Prognosis:  Patient understands that she is under continued medical work up at this time. Once medically stable she will discharge to SNF for short-term rehab.   Emotional Assessment Appearance:  Appears stated age Attitude/Demeanor/Rapport:    Affect (typically observed):  Accepting, Adaptable, Pleasant Orientation:  Oriented to Self, Oriented to  Time, Oriented to Place, Oriented to Situation, Fluctuating Orientation (Suspected and/or reported Sund[place]) Alcohol / Substance use:  Not Applicable Psych involvement (Current and /or in the community):  No (Comment)  Discharge Needs  Concerns to be addressed:  Discharge Planning Concerns Readmission within the last 30 days:  Yes Current discharge risk:  Chronically ill Barriers to Discharge:  Continued Medical Work up   [facility]	[place], LCSW 06/09/2015, 4:16 PM

## 2015-06-09 NOTE — Progress Notes (Signed)
Pharmacy Antibiotic Note  [name] is a 70 y.o. female admitted on 06/04/2015 with osteomyelitis.  Pharmacy has been consulted for vancomycin and piperacillin/tazobactam dosing.  Plan: This patient's current antibiotics will be continued without adjustments.   Day # 6 piperacillin/tazobactam 3.375 g IV q 12 hours  Patient is currently prescribed vancomycin 1000 mg IV q 36 hours. Vancomycin trough prior to 4th dose tonight was therapeutic at 18 mcg/mL. Patients renal function is stable and patient is not at  High risk for accumulation. Will continue with current dose and check trough prior to 5th dose at true steady state.  Height: 5\' 7"  (170.2 cm) Weight: 159 lb 11.2 oz (72.439 kg) IBW/kg (Calculated) : 61.6  Temp (24hrs), Avg:98.4 F (36.9 C), Min:98 F (36.7 C), Max:99.1 F (37.3 C)   Recent Labs Lab 06/04/15 1220 06/05/15 0600 06/06/15 0427 06/06/15 2035 06/07/15 0539 06/08/15 1528 06/09/15 0501 06/09/15 1900  WBC 5.0 4.3 3.5*  --  7.4  --  7.7  --   CREATININE 3.77* 3.65* 3.50*  --  3.16* 2.75* 2.85*  --   VANCOTROUGH  --   --   --   --   --   --   --  18  VANCORANDOM  --   --   --  8  --   --   --   --     Estimated Creatinine Clearance: 17.9 mL/min (by C-G formula based on Cr of 2.85).    Allergies  Allergen Reactions  . Codeine Other (See Comments)    Reaction:  Hallucinations       Thank you for allowing pharmacy to be a part of this patient's care.  Darylene Price [facility] 06/09/2015 9:27 PM

## 2015-06-09 NOTE — Progress Notes (Signed)
H/H 6.6/20.4  Prime doc (Pyreddy) paged d/t AM H/H, call back received. Pt lab values reviewed, questioned to hold AM dose of heparin (stated to give). Pt with weeping from Left groin site, +2 edema noted to peri area and upper thigh. No change in drainage from [facility] site, bloody drainage. Dsg intact, no need for canister change at this time. Pyreddy ordered to transfuse 1 unit PRBCs, pt with hx of chronic anemia (last tx on 06/06/15).  Filed Vitals:   06/08/15 2000 06/09/15 0500  BP: 152/46 166/55  Pulse: 65 73  Temp: 99.2 F (37.3 C) 98.2 F (36.8 C)  Resp: 18 18   Care to be assumed.

## 2015-06-09 NOTE — Progress Notes (Signed)
[place] [facility] [place] NAME: [name]    MR#:  [id number]  DATE OF BIRTH:  1946-04-09  SUBJECTIVE:   Hemoglobin is low today probably from wound VAC. She consents for blood transfusion  REVIEW OF SYSTEMS:    Review of Systems  Constitutional: Negative for fever, chills and malaise/fatigue.  HENT: Negative for sore throat.   Eyes: Negative for blurred vision.  Respiratory: Negative for cough, hemoptysis, shortness of breath and wheezing.   Cardiovascular: Negative for chest pain, palpitations and leg swelling.  Gastrointestinal: Negative for nausea, vomiting, abdominal pain, diarrhea and blood in stool.  Genitourinary: Negative for dysuria.  Musculoskeletal: Negative for back pain.  Neurological: Negative for dizziness, tremors and headaches.  Endo/Heme/Allergies: Does not bruise/bleed easily.    Tolerating Diet:yes      DRUG ALLERGIES:   Allergies  Allergen Reactions  . Codeine Other (See Comments)    Reaction:  Hallucinations    VITALS:  Blood pressure 131/63, pulse 74, temperature 99.1 F (37.3 C), temperature source Oral, resp. rate 18, height 5\' 7"  (1.702 m), weight 72.439 kg (159 lb 11.2 oz), SpO2 97 %.  PHYSICAL EXAMINATION:   Physical Exam  Constitutional: She is oriented to person, place, and time and well-developed, well-nourished, and in no distress. No distress.  HENT:  Head: Normocephalic.  Eyes: No scleral icterus.  Neck: Normal range of motion. Neck supple. No JVD present. No tracheal deviation present.  Cardiovascular: Normal rate, regular rhythm and normal heart sounds.  Exam reveals no gallop and no friction rub.   No murmur heard. Pulmonary/Chest: Effort normal and breath sounds normal. No respiratory distress. She has no wheezes. She has no rales. She exhibits no tenderness.  Abdominal: Soft. Bowel sounds are normal. She exhibits no distension and no mass. There is no tenderness. There is  no rebound and no guarding.  Musculoskeletal: Normal range of motion. She exhibits no edema.  Wound vac placed  Neurological: She is alert and oriented to person, place, and time.  Skin: Skin is warm. No erythema.  Psychiatric: Affect and judgment normal.      LABORATORY PANEL:   CBC  Recent Labs Lab 06/09/15 0501  WBC 7.7  HGB 6.6*  HCT 20.4*  PLT 140*   ------------------------------------------------------------------------------------------------------------------  Chemistries   Recent Labs Lab 06/09/15 0501  NA 140  K 4.3  CL 112*  CO2 20*  GLUCOSE 129*  BUN 56*  CREATININE 2.85*  CALCIUM 7.6*   ------------------------------------------------------------------------------------------------------------------  Cardiac Enzymes No results for input(s): TROPONINI in the last 168 hours. ------------------------------------------------------------------------------------------------------------------  RADIOLOGY:  No results found.   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  1. Osteomyelitis right foot: Patient was on IV Zosyn and oral Bactrim at discharge. She has PICC line placed from last hospitalization. She is postoperative day #2 for transmetatarsal amputation revision. Continue wound VAC. Continue Zosyn and vancomycin with ID follow-up after discharge. Patient needs to be  strict non-weight bearing 2 foot Daily dry dressing upon discharge Follow-up podiatry in 1 week after discharge  2. Acute on chronic renal failure stage III: Creatinine increased from previous.  Her baseline is around 1.7-2. Hold nephrotoxic agents. This is medication induced from ACE inhibitor, Bactrim and Lasix.  consult nephrology. Suspect after blood  transfusion  patient's creatinine will improve.  3. Diabetes: Continue sliding scale insulin. 4 Essential hypertension: Continue clonidine, isosorbide, propanolol  5. Hyperlipidemia: Continue simvastatin  6. History of CAD: Continue isosorbide, simvastatin and propanolol.  7.  acute on chronic Anemia  and iron deficiency: Continue ferrous sulfate and folic acid.   patient will have 2 more units of packed red blood cells today to make a total of 3 units PRBCs. Blood loss is from wound VAC.      Management plans discussed with the patient and she is in agreement.  CODE STATUS: FULL  TOTAL TIME TAKING CARE OF THIS PATIENT:25 minutes.   D/w Dr Candiss Norse and Dr Vickki Muff  POSSIBLE D/C tomorrow, DEPENDING ON CLINICAL CONDITION.   Athens Lebeau M.D on 06/09/2015 at 10:19 AM  Between 7am to 6pm - Pager - [phone] After 6pm go to [email or web address] - password [email or web address] [facility]  Office  [phone]  CC: Primary care physician; [facility]  Note: This dictation was prepared with Dragon dictation along with smaller phrase technology. Any transcriptional errors that result from this process are unintentional.

## 2015-06-10 ENCOUNTER — Encounter: Payer: Self-pay | Admitting: Podiatry

## 2015-06-10 LAB — GLUCOSE, CAPILLARY
GLUCOSE-CAPILLARY: 114 mg/dL — AB (ref 65–99)
Glucose-Capillary: 173 mg/dL — ABNORMAL HIGH (ref 65–99)

## 2015-06-10 LAB — CBC
HCT: 25.7 % — ABNORMAL LOW (ref 35.0–47.0)
Hemoglobin: 8.7 g/dL — ABNORMAL LOW (ref 12.0–16.0)
MCH: 28.3 pg (ref 26.0–34.0)
MCHC: 34 g/dL (ref 32.0–36.0)
MCV: 83.2 fL (ref 80.0–100.0)
PLATELETS: 134 10*3/uL — AB (ref 150–440)
RBC: 3.08 MIL/uL — AB (ref 3.80–5.20)
RDW: 18.4 % — AB (ref 11.5–14.5)
WBC: 6.4 10*3/uL (ref 3.6–11.0)

## 2015-06-10 LAB — WOUND CULTURE

## 2015-06-10 LAB — BASIC METABOLIC PANEL
Anion gap: 6 (ref 5–15)
BUN: 54 mg/dL — AB (ref 6–20)
CALCIUM: 7.6 mg/dL — AB (ref 8.9–10.3)
CO2: 20 mmol/L — ABNORMAL LOW (ref 22–32)
CREATININE: 2.53 mg/dL — AB (ref 0.44–1.00)
Chloride: 114 mmol/L — ABNORMAL HIGH (ref 101–111)
GFR calc Af Amer: 21 mL/min — ABNORMAL LOW (ref >60)
GFR, EST NON AFRICAN AMERICAN: 18 mL/min — AB (ref >60)
GLUCOSE: 117 mg/dL — AB (ref 65–99)
Potassium: 4 mmol/L (ref 3.5–5.1)
SODIUM: 140 mmol/L (ref 135–145)

## 2015-06-10 MED ORDER — SULFAMETHOXAZOLE-TRIMETHOPRIM 400-80 MG PO TABS: 1.0000 | ORAL_TABLET | Freq: Two times a day (BID) | ORAL | Status: AC

## 2015-06-10 MED ORDER — GLUCERNA SHAKE PO LIQD
237.0000 mL | Freq: Three times a day (TID) | ORAL | Status: DC
  Administered 2015-06-10: 237 mL via ORAL

## 2015-06-10 MED ORDER — OXYCODONE-ACETAMINOPHEN 5-325 MG PO TABS: 1.0000 | ORAL_TABLET | ORAL | Status: AC | PRN

## 2015-06-10 NOTE — Progress Notes (Signed)
Dr mody notified of need of PT consult for insurance authorization

## 2015-06-10 NOTE — Progress Notes (Signed)
Report called to [facility] -Kim will be the receiving nurse.  EMS called to transport the pt

## 2015-06-10 NOTE — Progress Notes (Signed)
Subjective:  Patient is currently in good spirits. Creatinine lower today but still above her baseline. Creatinine currently 2.53. It appears that she will be discharged to rehabilitation Center today.  Objective:  Vital signs in last 24 hours:  Temp:  [97.7 F (36.5 C)-99 F (37.2 C)] 97.7 F (36.5 C) (02/06 1432) Pulse Rate:  [63-100] 67 (02/06 1432) Resp:  [18-20] 18 (02/06 1432) BP: (138-170)/(47-88) 152/48 mmHg (02/06 1432) SpO2:  [95 %-100 %] 95 % (02/06 1432)  Weight change:  Filed Weights   06/04/15 1030  Weight: 72.439 kg (159 lb 11.2 oz)    Intake/Output:    Intake/Output Summary (Last 24 hours) at 06/10/15 1609 Last data filed at 06/09/15 1800  Gross per 24 hour  Intake    284 ml  Output      0 ml  Net    284 ml     Physical Exam: General:  no acute distress, laying in the bed   HEENT  anicteric, moist oral mucous membranes   Neck  supple   Pulm/lungs  normal respiratory effort, clear to auscultation   CVS/Heart  regular rhythm,   Abdomen:   soft, nontender   Extremities:  right foot in bandage   Neurologic:  alert, oriented   Skin:  normal mood and affect   Access:        Basic Metabolic Panel:   Recent Labs Lab 06/06/15 0427 06/07/15 0539 06/08/15 1528 06/09/15 0501 06/10/15 0441  NA 138 138 140 140 140  K 4.7 4.5 4.0 4.3 4.0  CL 114* 112* 116* 112* 114*  CO2 18* 22 19* 20* 20*  GLUCOSE 123* 153* 136* 129* 117*  BUN 65* 62* 53* 56* 54*  CREATININE 3.50* 3.16* 2.75* 2.85* 2.53*  CALCIUM 7.3* 7.6* 6.7* 7.6* 7.6*     CBC:  Recent Labs Lab 06/05/15 0600 06/06/15 0427 06/06/15 1702 06/07/15 0539 06/09/15 0501 06/09/15 1830 06/10/15 0441  WBC 4.3 3.5*  --  7.4 7.7  --  6.4  NEUTROABS  --  2.3  --   --   --   --   --   HGB 7.7* 6.9* 8.3* 8.7* 6.6* 8.5* 8.7*  HCT 23.9* 21.3* 25.3* 26.3* 20.4* 25.8* 25.7*  MCV 89.2 87.7  --  87.4 88.0  --  83.2  PLT 138* 125*  --  151 140*  --  134*      Microbiology:  Recent Results  (from the past 720 hour(s))  Wound culture     Status: None   Collection Time: 05/20/15 10:15 AM  Result Value Ref Range Status   Specimen Description FOOT  Final   Special Requests NONE  Final   Gram Stain   Final    FEW WBC SEEN FEW GRAM NEGATIVE RODS RARE GRAM POSITIVE COCCI IN PAIRS    Culture   Final    LIGHT GROWTH ENTEROBACTER CLOACAE MODERATE GROWTH STENOTROPHOMONAS MALTOPHILIA LIGHT GROWTH ENTEROCOCCUS FAECIUM    Report Status 05/23/2015 FINAL  Final   Organism ID, Bacteria STENOTROPHOMONAS MALTOPHILIA  Final   Organism ID, Bacteria ENTEROCOCCUS FAECIUM  Final   Organism ID, Bacteria ENTEROBACTER CLOACAE  Final      Susceptibility   Enterobacter cloacae - MIC*    PIP/TAZO Value in next row Sensitive      SENSITIVE<=4    CEFTAZIDIME Value in next row Sensitive      SENSITIVE<=1    CEFTRIAXONE Value in next row Sensitive      SENSITIVE<=1  IMIPENEM Value in next row Sensitive      SENSITIVE<=0.25    GENTAMICIN Value in next row Sensitive      SENSITIVE<=1    CIPROFLOXACIN Value in next row Sensitive      SENSITIVE<=0.25    NITROFURANTOIN Value in next row Sensitive      SENSITIVE32    TRIMETH/SULFA Value in next row Sensitive      SENSITIVE<=20    * LIGHT GROWTH ENTEROBACTER CLOACAE   Stenotrophomonas maltophilia - MIC*    LEVOFLOXACIN Value in next row Resistant      SENSITIVE<=20    TRIMETH/SULFA Value in next row Sensitive      SENSITIVE<=20    * MODERATE GROWTH STENOTROPHOMONAS MALTOPHILIA   Enterococcus faecium - MIC*    AMPICILLIN Value in next row Resistant      SENSITIVE<=20    GENTAMICIN SYNERGY Value in next row Sensitive      SENSITIVE<=20    * LIGHT GROWTH ENTEROCOCCUS FAECIUM  Anaerobic culture     Status: None   Collection Time: 05/20/15 10:15 AM  Result Value Ref Range Status   Specimen Description FOOT  Final   Special Requests NONE  Final   Culture NO ANAEROBES ISOLATED  Final   Report Status 05/24/2015 FINAL  Final  Surgical pcr  screen     Status: None   Collection Time: 05/23/15 11:57 AM  Result Value Ref Range Status   MRSA, PCR NEGATIVE NEGATIVE Final   Staphylococcus aureus NEGATIVE NEGATIVE Final    Comment:        The Xpert SA Assay (FDA approved for NASAL specimens in patients over 22 years of age), is one component of a comprehensive surveillance program.  Test performance has been validated by [facility] for patients greater than or equal to 60 year old. It is not intended to diagnose infection nor to guide or monitor treatment.   Surgical pcr screen     Status: None   Collection Time: 05/27/15  3:41 PM  Result Value Ref Range Status   MRSA, PCR NEGATIVE NEGATIVE Final   Staphylococcus aureus NEGATIVE NEGATIVE Final    Comment:        The Xpert SA Assay (FDA approved for NASAL specimens in patients over 24 years of age), is one component of a comprehensive surveillance program.  Test performance has been validated by [facility] for patients greater than or equal to 109 year old. It is not intended to diagnose infection nor to guide or monitor treatment.   Wound culture     Status: None   Collection Time: 06/04/15  8:36 AM  Result Value Ref Range Status   Specimen Description FOOT  Final   Special Requests NONE  Final   Gram Stain   Final    RARE WBC SEEN FEW RED BLOOD CELLS NO ORGANISMS SEEN    Culture   Final    LIGHT GROWTH ENTEROCOCCUS FAECIUM RARE GROWTH METHICILLIN RESISTANT STAPHYLOCOCCUS AUREUS    Report Status 06/10/2015 FINAL  Final   Organism ID, Bacteria ENTEROCOCCUS FAECIUM  Final   Organism ID, Bacteria METHICILLIN RESISTANT STAPHYLOCOCCUS AUREUS  Final      Susceptibility   Enterococcus faecium - MIC*    AMPICILLIN >=32 RESISTANT Resistant     LINEZOLID 2 SENSITIVE Sensitive     * LIGHT GROWTH ENTEROCOCCUS FAECIUM   Methicillin resistant staphylococcus aureus - MIC*    CIPROFLOXACIN >=8 RESISTANT Resistant     GENTAMICIN <=0.5 SENSITIVE Sensitive  OXACILLIN >=4 RESISTANT Resistant     TETRACYCLINE <=1 SENSITIVE Sensitive     VANCOMYCIN 1 SENSITIVE Sensitive     TRIMETH/SULFA <=10 SENSITIVE Sensitive     RIFAMPIN <=0.5 SENSITIVE Sensitive     Inducible Clindamycin NEGATIVE Sensitive     * RARE GROWTH METHICILLIN RESISTANT STAPHYLOCOCCUS AUREUS  Anaerobic culture     Status: None   Collection Time: 06/04/15  8:36 AM  Result Value Ref Range Status   Specimen Description FOOT  Final   Special Requests NONE  Final   Culture NO ANAEROBES ISOLATED  Final   Report Status 06/07/2015 FINAL  Final  Surgical pcr screen     Status: None   Collection Time: 06/04/15 10:12 AM  Result Value Ref Range Status   MRSA, PCR NEGATIVE NEGATIVE Final   Staphylococcus aureus NEGATIVE NEGATIVE Final    Comment:        The Xpert SA Assay (FDA approved for NASAL specimens in patients over 48 years of age), is one component of a comprehensive surveillance program.  Test performance has been validated by [facility] for patients greater than or equal to 23 year old. It is not intended to diagnose infection nor to guide or monitor treatment.   Wound culture     Status: None (Preliminary result)   Collection Time: 06/07/15  3:00 PM  Result Value Ref Range Status   Specimen Description WOUND  Final   Special Requests NONE  Final   Gram Stain RARE WBC SEEN RARE GRAM POSITIVE COCCI   Final   Culture HOLDING FOR POSSIBLE PATHOGEN  Final   Report Status PENDING  Incomplete    Coagulation Studies: No results for input(s): LABPROT, INR in the last 72 hours.  Urinalysis: No results for input(s): COLORURINE, LABSPEC, PHURINE, GLUCOSEU, HGBUR, BILIRUBINUR, KETONESUR, PROTEINUR, UROBILINOGEN, NITRITE, LEUKOCYTESUR in the last 72 hours.  Invalid input(s): APPERANCEUR    Imaging: No results found.   Medications:     . sodium chloride   Intravenous Once  . amLODipine  10 mg Oral Daily  . calcium-vitamin D  1 tablet Oral BID  . cloNIDine  0.2  mg Oral TID  . docusate sodium  100 mg Oral BID  . feeding supplement (GLUCERNA SHAKE)  237 mL Oral TID WC  . ferrous sulfate  325 mg Oral Daily  . folic acid  1 mg Oral Daily  . heparin  5,000 Units Subcutaneous 3 times per day  . insulin aspart  0-9 Units Subcutaneous TID WC  . isosorbide dinitrate  30 mg Oral BID  . mirtazapine  15 mg Oral QHS  . pantoprazole  40 mg Oral Daily  . piperacillin-tazobactam  3.375 g Intravenous Q12H  . propranolol  40 mg Oral TID  . simvastatin  20 mg Oral QHS  . vancomycin  1,000 mg Intravenous Q36H   acetaminophen, nitroGLYCERIN, oxyCODONE-acetaminophen, traMADol  Assessment/ Plan:  70 y.o. African-American  female With history of diabetes with retinopathy and nephropathy, proteinuria, hypertension, congestive heart failure, coronary disease with PCI, left renal injury or myolipoma, hepatic hemangioma, chronic kidney disease stage III   1. Acute kidney injury on chronic kidney disease stage III. Baseline creatinine 1.3/GFR 45 - Acute renal failure is likely secondary to concurrent infection - renal function has improved but will require continued monitoring since she's not yet back to her baseline.  We will plan to see her back in the office early next week.  2. Diabetes type 2 with chronic kidney disease -  glycemic control will be important in preventing further renal damage.  3. Hypertension  blood pressure currently 152/48.  Continue amlodipineand clonidine.  We will consider ACE inhibitor/ARB as an outpatient.   LOS: 6 Nylen Creque 2/6/20174:09 PM

## 2015-06-10 NOTE — Care Management Important Message (Signed)
Important Message  Patient Details  Name: [name] MRN: [id number] Date of Birth: Oct 13, 1945   Medicare Important Message Given:  Yes    Mariona Scholes A, RN 06/10/2015, 7:28 AM

## 2015-06-10 NOTE — Progress Notes (Signed)
[name] called Clinical Social Worker (CSW) to get clarification on medications. Per Dr. Ola Spurr IV Abx stop date is 06/25/15. Patient has a follow up appointment with Dr. Ola Spurr on 06/24/15. [name] is aware of above.   Blima Rich, LCSW [phone]

## 2015-06-10 NOTE — Evaluation (Signed)
Physical Therapy Evaluation Patient Details Name: [name] MRN: [id number] DOB: 1946-01-28 Today's Date: 06/10/2015   History of Present Illness  presented as direct admit from podiatry office due to gangrenous changes and poor healing of previous R foor transmet amputation.  Status post angiogram to R LE with two tibial interventions (2/2) and revision of R TMA to lisfranc amputation (2/3).  Orders for strict NWB R LE; high risk for further amputation per notes.  Clinical Impression  Upon evaluation, patient alert and oriented to basic information; follows simple commands, but demonstrates limited insight into functional deficits and current WBing restrictions.  Patient globally weak and deconditioned throughout all extremities, lacking functional endurance to safety/indep complete basic transfers and mobility.  Currently requiring mod assist +2 for basic transfers (lateral scoot pivot) over level surfaces, boosting and chair push-ups. Unsafe to attempt standing or gait efforts due to poor comprehension and ability to maintain NWB R LE with higher-level activities.  Will require constant cuing/assist for adherence to [facility] restrictions R LE; recommend WC level for all mobility at this time. Would benefit from skilled PT to address above deficits and promote optimal return to PLOF; recommend transition to STR upon discharge from acute hospitalization.     Follow Up Recommendations SNF    Equipment Recommendations       Recommendations for Other Services       Precautions / Restrictions Precautions Precautions: Fall Precaution Comments: Contact isolation Restrictions Weight Bearing Restrictions: Yes RLE Weight Bearing: Non weight bearing      Mobility  Bed Mobility Overal bed mobility: Needs Assistance Bed Mobility: Supine to Sit     Supine to sit: Supervision        Transfers Overall transfer level: Needs assistance   Transfers: Lateral/Scoot Transfers           Lateral/Scoot Transfers: Mod assist;+2 physical assistance General transfer comment: constant cuing/assist for hand/foot placement, NWB R LE and overall movement sequencing.  Poor UB strength and functional endurnace required to complete sequential lateral scoots to indep complete transfer  Ambulation/Gait             General Gait Details: unsafe/unable due to limited comprehension and adherence to NWB R LE  Stairs            Wheelchair Mobility    Modified Rankin (Stroke Patients Only)       Balance Overall balance assessment: Needs assistance Sitting-balance support: No upper extremity supported;Feet supported Sitting balance-Leahy Scale: Fair                                       Pertinent Vitals/Pain Pain Assessment: 0-10 Pain Score: 8  Pain Descriptors / Indicators: Aching Pain Intervention(s): Limited activity within patient's tolerance;RN gave pain meds during session;Monitored during session;Patient requesting pain meds-RN notified;Repositioned    Home Living Family/patient expects to be discharged to:: Skilled nursing facility Living Arrangements: Spouse/significant other Available Help at Discharge: Family Type of Home: House Home Access: Stairs to enter Entrance Stairs-Rails: Can reach both Entrance Stairs-Number of Steps: 2 Home Layout: Two level;Able to live on main level with bedroom/bathroom Home Equipment: [name] - 2 wheels;Cane - single point      Prior Function Level of Independence: Independent with assistive device(s)         Comments: Uses RW for all mobility. Does not perform community distance ambualtion due to self reports fo low energy and  activity tolerance.  Does endorse single fall within previous six months.  Has been in [place] since previous admission, working at primarily [facility] level for basic transfers and mobility.     Hand Dominance        Extremity/Trunk Assessment   Upper Extremity Assessment: Overall WFL  for tasks assessed           Lower Extremity Assessment: Generalized weakness (grossly 3+/5 throughout bilat LEs; +2 pitting edema L LE)         Communication   Communication: No difficulties  Cognition Arousal/Alertness: Awake/alert   Overall Cognitive Status:  (continued difficulty processing information, comprehending/maintaining NWB R LE)                      General Comments General comments (skin integrity, edema, etc.): L groin site clean and dry; R foot with mod serosanguinous drainage through dressing (RN informed/aware, states unchanged from this AM)    Exercises Other Exercises Other Exercises: Chair push-ups, 1x5, for UB strength/endurance. Unable to consistently lift/clear buttocks from seating surfaces due to fatigue. Other Exercises: Lateral scooting edge of bed, min/mod assist for lift off.  Poor functional stamina; constant cuing/assist for NWB R LE      Assessment/Plan    PT Assessment Patient needs continued PT services  PT Diagnosis Difficulty walking;Generalized weakness;Acute pain   PT Problem List Decreased strength;Decreased activity tolerance;Decreased balance;Decreased mobility;Decreased coordination;Decreased cognition;Decreased knowledge of use of DME;Decreased safety awareness;Decreased knowledge of precautions;Decreased skin integrity;Pain  PT Treatment Interventions DME instruction;Gait training;Stair training;Functional mobility training;Therapeutic activities;Therapeutic exercise;Balance training;Patient/family education;Cognitive remediation   PT Goals (Current goals can be found in the Care Plan section) Acute Rehab PT Goals Patient Stated Goal: patient agreeable to session; family agreeable/intersted in STR PT Goal Formulation: With patient Time For Goal Achievement: 06/24/15 Potential to Achieve Goals: Good    Frequency 7X/week   Barriers to discharge Inaccessible home environment;Decreased caregiver support       Co-evaluation               End of Session Equipment Utilized During Treatment: Gait belt Activity Tolerance: Patient limited by fatigue Patient left: with call bell/phone within reach;with bed alarm set;in chair;with chair alarm set Nurse Communication: Mobility status;Patient requests pain meds         Time: [id number] PT Time Calculation (min) (ACUTE ONLY): 23 min   Charges:   PT Evaluation $PT Eval Moderate Complexity: 1 Procedure PT Treatments $Therapeutic Activity: 8-22 mins   PT G Codes:        Jozalynn Noyce H. Owens Shark, PT, DPT, NCS 06/10/2015, 11:02 AM [phone]

## 2015-06-10 NOTE — Discharge Summary (Signed)
[facility] NAME: [name]    MR#:  [id number]  DATE OF BIRTH:  1946-05-03  DATE OF ADMISSION:  06/04/2015 ADMITTING PHYSICIAN: Vaughan Basta, MD  DATE OF DISCHARGE: 06/10/2015  PRIMARY CARE PHYSICIAN: [facility]    ADMISSION DIAGNOSIS:  hangrene rt foot pad with gangrene TRANSMETATARSAL  DISCHARGE DIAGNOSIS:  Active Problems:   Diabetic osteomyelitis (HCC)   Gangrene of foot ([place])   Sacral pressure ulcer   SECONDARY DIAGNOSIS:   Past Medical History  Diagnosis Date  . Hypertension   . Diabetes mellitus without complication ([place])   . Anemia   . Coronary artery disease   . CHF (congestive heart failure) ([place])   . Glaucoma   . Hyperlipidemia   . Neuropathy ([place])   . Chronic anemia   . Stroke ([place])   . Anginal pain ([place])   . Arthritis   . Retinopathy   . Cardiomyopathy ([place])   . Carotid artery stenosis   . Hoarseness, chronic   . GERD (gastroesophageal reflux disease)   . Hemangioma of liver   . Weight loss, unintentional   . DDD (degenerative disc disease), lumbar   . Osteomyelitis of foot, right, acute ([place])   . Chronic kidney disease     Stage 3    HOSPITAL COURSE:    70 year old female with a history of peripheral arterial disease and chronic kidney disease who presented with ostial myelitis of the right foot status post transmetatarsal amputation who was found to have gangrenous changes at the amputation site.  1. Osteomyelitis right foot: Patient was on IV Zosyn and oral Bactrim at discharge. She has PICC line placed from last hospitalization. She is postoperative day #3 for transmetatarsal amputation revision. Continue Zosyn and Bacrim (RENALLY DOSED) n with ID follow-up in 2 days. Patient needs to be strict non-weight bearing 2 foot Daily dry dressing upon discharge Follow-up podiatry in 1 week after discharge  2. Acute on chronic renal failure stage III:  Creatinine increased from previous. Her baseline is around 1.7-2. Hold nephrotoxic agents. This is medication induced from ACE inhibitor and Lasix and concurrent infection. Creatinine is improving   3. Diabetes: Continue outpatient regimen and  ADA diet 4 Essential hypertension: Continue isosorbide, hydralazine, labetolol and clonidine  5. Hyperlipidemia: Continue simvastatin  6. History of CAD: Continue isosorbide, simvastatin  7. acute on chronic Anemia and iron deficiency: Continue ferrous sulfate and folic acid.  She received a total of 3 units PRBCs. Blood loss is from wound VAC.  DISCHARGE CONDITIONS AND DIET:   Stable  Diabetic heart healthy diet   CONSULTS OBTAINED:  Treatment Team:  Katha Cabal, MD Adrian Prows, MD Murlean Iba, MD  DRUG ALLERGIES:   Allergies  Allergen Reactions  . Codeine Other (See Comments)    Reaction:  Hallucinations    DISCHARGE MEDICATIONS:   Current Discharge Medication List    START taking these medications   Details  sulfamethoxazole-trimethoprim (BACTRIM) 400-80 MG tablet Take 1 tablet by mouth 2 (two) times daily. Qty: 60 tablet, Refills: 0      CONTINUE these medications which have CHANGED   Details  oxyCODONE-acetaminophen (ROXICET) 5-325 MG tablet Take 1-2 tablets by mouth every 4 (four) hours as needed for severe pain. Qty: 30 tablet, Refills: 0      CONTINUE these medications which have NOT CHANGED   Details  acetaminophen (TYLENOL) 325 MG tablet Take 2 tablets (650 mg total) by mouth  every 4 (four) hours as needed for mild pain (or temp > 99 F).    calcium-vitamin D (OSCAL WITH D) 500-200 MG-UNIT tablet Take 1 tablet by mouth 2 (two) times daily. Qty: 60 tablet, Refills: 2    cloNIDine (CATAPRES) 0.2 MG tablet Take 1 tablet (0.2 mg total) by mouth 3 (three) times daily. Qty: 90 tablet, Refills: 0    docusate sodium (COLACE) 100 MG capsule Take 1 capsule (100 mg total) by mouth 2 (two) times  daily. Qty: 60 capsule, Refills: 11   Associated Diagnoses: Constipation, unspecified constipation type    feeding supplement, GLUCERNA SHAKE, (GLUCERNA SHAKE) LIQD Take 237 mLs by mouth 2 (two) times daily between meals. Qty: 60 Can, Refills: 0    ferrous sulfate 325 (65 FE) MG tablet Take 325 mg by mouth daily. Reported on [id number]    folic acid (FOLVITE) 1 MG tablet Take 1 tablet (1 mg total) by mouth daily. Qty: 60 tablet, Refills: 1    hydrALAZINE (APRESOLINE) 100 MG tablet Take 100 mg by mouth 3 (three) times daily.     insulin aspart (NOVOLOG) 100 UNIT/ML injection Inject 0-9 Units into the skin 3 (three) times daily with meals. Qty: 10 mL, Refills: 11    isosorbide dinitrate (ISORDIL) 30 MG tablet Take 30 mg by mouth 2 (two) times daily. Reported on 05/27/2015    labetalol (NORMODYNE) 100 MG tablet Take 100 mg by mouth 2 (two) times daily. Reported on 05/27/2015    mirtazapine (REMERON) 15 MG tablet Take 15 mg by mouth at bedtime. Reported on 05/27/2015    nitroGLYCERIN (NITROSTAT) 0.4 MG SL tablet Place 0.4 mg under the tongue every 5 (five) minutes as needed for chest pain.    OMEGA-3 FATTY ACIDS PO Take 1 capsule by mouth daily. Reported on 05/27/2015    omeprazole (PRILOSEC) 20 MG capsule Take 20 mg by mouth daily.    piperacillin-tazobactam (ZOSYN) 3.375 GM/50ML IVPB Inject 50 mLs (3.375 g total) into the vein every 8 (eight) hours. Qty: 50 mL, Refills: 0    potassium chloride SA (K-DUR,KLOR-CON) 20 MEQ tablet Take 1 tablet (20 mEq total) by mouth daily. Qty: 30 tablet, Refills: 5    simvastatin (ZOCOR) 20 MG tablet Take 20 mg by mouth at bedtime. Reported on 05/27/2015      STOP taking these medications     propranolol (INDERAL) 20 MG tablet      furosemide (LASIX) 40 MG tablet      meloxicam (MOBIC) 7.5 MG tablet      minocycline (MINOCIN,DYNACIN) 100 MG capsule      ramipril (ALTACE) 5 MG capsule      sulfamethoxazole-trimethoprim (BACTRIM DS,SEPTRA DS)  800-160 MG tablet      traMADol (ULTRAM) 50 MG tablet               Today   CHIEF COMPLAINT:  Doing well ready for discharge No issues overnight No pain   VITAL SIGNS:  Blood pressure 166/60, pulse 63, temperature 98.3 F (36.8 C), temperature source Oral, resp. rate 18, height 5\' 7"  (1.702 m), weight 72.439 kg (159 lb 11.2 oz), SpO2 95 %.   REVIEW OF SYSTEMS:  Review of Systems  Constitutional: Negative for fever, chills and malaise/fatigue.  HENT: Negative for sore throat.   Eyes: Negative for blurred vision.  Respiratory: Negative for cough, hemoptysis, shortness of breath and wheezing.   Cardiovascular: Negative for chest pain, palpitations and leg swelling.  Gastrointestinal: Negative for nausea, vomiting, abdominal pain, diarrhea  and blood in stool.  Genitourinary: Negative for dysuria.  Musculoskeletal: Negative for back pain.  Skin:       Dressing right foot  Neurological: Negative for dizziness, tremors and headaches.  Endo/Heme/Allergies: Does not bruise/bleed easily.     PHYSICAL EXAMINATION:  GENERAL:  70 y.o.-year-old patient lying in the bed with no acute distress.  NECK:  Supple, no jugular venous distention. No thyroid enlargement, no tenderness.  LUNGS: Normal breath sounds bilaterally, no wheezing, rales,rhonchi  No use of accessory muscles of respiration.  CARDIOVASCULAR: S1, S2 normal. No murmurs, rubs, or gallops.  ABDOMEN: Soft, non-tender, non-distended. Bowel sounds present. No organomegaly or mass.  EXTREMITIES: No pedal edema, cyanosis, or clubbing.  PSYCHIATRIC: The patient is alert and oriented x 3.  SKIN: right foot dressed.   DATA REVIEW:   CBC  Recent Labs Lab 06/10/15 0441  WBC 6.4  HGB 8.7*  HCT 25.7*  PLT 134*    Chemistries   Recent Labs Lab 06/10/15 0441  NA 140  K 4.0  CL 114*  CO2 20*  GLUCOSE 117*  BUN 54*  CREATININE 2.53*  CALCIUM 7.6*    Cardiac Enzymes No results for input(s): TROPONINI in  the last 168 hours.  Microbiology Results  @MICRORSLT48 @  RADIOLOGY:  No results found.    Management plans discussed with the patient and she is in agreement. Stable for discharge   Patient should follow up with ID and nephrology 2 days Dr Vickki Muff 1 week  CODE STATUS:     Code Status Orders        Start     Ordered   06/04/15 1010  Full code   Continuous     06/04/15 1010    Code Status History    Date Active Date Inactive Code Status Order ID Comments User Context   05/28/2015  2:56 PM 05/30/2015  7:58 PM Full Code [id number]  Sharlotte Alamo, DPM Inpatient   05/27/2015 11:34 AM 05/28/2015  2:56 PM Full Code [id number]  Fritzi Mandes, MD Inpatient   04/03/2015  7:22 PM 04/08/2015  9:33 PM Full Code [id number]  Sharlotte Alamo, MD Inpatient   04/02/2015 11:59 AM 04/03/2015  7:22 PM Full Code [id number]  Hillary Bow, MD Inpatient   03/01/2015  8:36 AM 03/01/2015  1:28 PM Full Code [id number]  [name], DPM Inpatient   01/29/2015  8:45 PM 02/02/2015  9:29 PM Full Code [id number]  Aldean Jewett, MD Inpatient   12/06/2014  5:15 PM 12/13/2014 10:23 PM Full Code [id number]  Newman Pies, MD Inpatient   12/03/2014  9:19 PM 12/06/2014  5:15 PM Full Code [id number]  Alexis Goodell, MD Inpatient   10/03/2014  3:55 AM 10/09/2014  6:14 PM Full Code [id number]  Juluis Mire, MD Inpatient   09/17/2014  2:00 PM 09/20/2014  5:51 PM Full Code [id number]  Theodoro Grist, MD Inpatient      TOTAL TIME TAKING CARE OF THIS PATIENT: 35 minutes.    Note: This dictation was prepared with Dragon dictation along with smaller phrase technology. Any transcriptional errors that result from this process are unintentional.  Sherleen Pangborn M.D on 06/10/2015 at 7:52 AM  Between 7am to 6pm - Pager - [phone] After 6pm go to [email or web address] - password [email or web address] [facility]  Office  [phone]  CC: Primary care physician; [facility]

## 2015-06-10 NOTE — Progress Notes (Signed)
Patient is medically stable for D/C back to [facility] today. Per [name] liaison patient is going to room 612. [facility] authorization has been received. Auth # [id number]. RN will call report to [facility] [place] at [phone] and arrange EMS for transport. Clinical Education officer, museum (CSW) sent D/C Summary, FL2 and D/C Packet to [employer] via [employer]. Patient is aware of above. Patient's sister is at bedside and aware of above. CSW also spoke with patient's son [name] and made him aware of above. Please reconsult if future social work needs arise. CSW signing off.   Blima Rich, LCSW [phone]

## 2015-06-10 NOTE — Progress Notes (Signed)
EMS is here to transport the pt.  Family is here with the pt

## 2015-06-11 LAB — TYPE AND SCREEN
ABO/RH(D): O POS
ANTIBODY SCREEN: NEGATIVE
Unit division: 0
Unit division: 0
Unit division: 0

## 2015-06-11 LAB — WOUND CULTURE

## 2015-06-11 LAB — SURGICAL PATHOLOGY

## 2015-06-27 ENCOUNTER — Inpatient Hospital Stay
Admission: EM | Admit: 2015-06-27 | Discharge: 2015-07-03 | DRG: 871 | Disposition: E | Payer: Commercial Managed Care - HMO | Attending: Internal Medicine | Admitting: Internal Medicine

## 2015-06-27 ENCOUNTER — Inpatient Hospital Stay: Payer: Commercial Managed Care - HMO

## 2015-06-27 ENCOUNTER — Encounter: Payer: Self-pay | Admitting: Emergency Medicine

## 2015-06-27 ENCOUNTER — Emergency Department: Payer: Commercial Managed Care - HMO

## 2015-06-27 DIAGNOSIS — Z79899 Other long term (current) drug therapy: Secondary | ICD-10-CM | POA: Diagnosis not present

## 2015-06-27 DIAGNOSIS — E1169 Type 2 diabetes mellitus with other specified complication: Secondary | ICD-10-CM | POA: Diagnosis present

## 2015-06-27 DIAGNOSIS — D65 Disseminated intravascular coagulation [defibrination syndrome]: Secondary | ICD-10-CM | POA: Diagnosis present

## 2015-06-27 DIAGNOSIS — Z833 Family history of diabetes mellitus: Secondary | ICD-10-CM | POA: Diagnosis not present

## 2015-06-27 DIAGNOSIS — M869 Osteomyelitis, unspecified: Secondary | ICD-10-CM | POA: Diagnosis present

## 2015-06-27 DIAGNOSIS — A419 Sepsis, unspecified organism: Secondary | ICD-10-CM | POA: Diagnosis present

## 2015-06-27 DIAGNOSIS — I959 Hypotension, unspecified: Secondary | ICD-10-CM

## 2015-06-27 DIAGNOSIS — Z66 Do not resuscitate: Secondary | ICD-10-CM | POA: Diagnosis not present

## 2015-06-27 DIAGNOSIS — Z792 Long term (current) use of antibiotics: Secondary | ICD-10-CM

## 2015-06-27 DIAGNOSIS — N183 Chronic kidney disease, stage 3 (moderate): Secondary | ICD-10-CM | POA: Diagnosis present

## 2015-06-27 DIAGNOSIS — R6521 Severe sepsis with septic shock: Secondary | ICD-10-CM | POA: Diagnosis present

## 2015-06-27 DIAGNOSIS — I429 Cardiomyopathy, unspecified: Secondary | ICD-10-CM | POA: Diagnosis present

## 2015-06-27 DIAGNOSIS — A047 Enterocolitis due to Clostridium difficile: Secondary | ICD-10-CM | POA: Diagnosis present

## 2015-06-27 DIAGNOSIS — T8130XA Disruption of wound, unspecified, initial encounter: Secondary | ICD-10-CM | POA: Diagnosis present

## 2015-06-27 DIAGNOSIS — J9811 Atelectasis: Secondary | ICD-10-CM | POA: Diagnosis present

## 2015-06-27 DIAGNOSIS — E785 Hyperlipidemia, unspecified: Secondary | ICD-10-CM | POA: Diagnosis present

## 2015-06-27 DIAGNOSIS — I130 Hypertensive heart and chronic kidney disease with heart failure and stage 1 through stage 4 chronic kidney disease, or unspecified chronic kidney disease: Secondary | ICD-10-CM | POA: Diagnosis present

## 2015-06-27 DIAGNOSIS — J9621 Acute and chronic respiratory failure with hypoxia: Secondary | ICD-10-CM | POA: Diagnosis not present

## 2015-06-27 DIAGNOSIS — Z8673 Personal history of transient ischemic attack (TIA), and cerebral infarction without residual deficits: Secondary | ICD-10-CM | POA: Diagnosis not present

## 2015-06-27 DIAGNOSIS — E1122 Type 2 diabetes mellitus with diabetic chronic kidney disease: Secondary | ICD-10-CM | POA: Diagnosis present

## 2015-06-27 DIAGNOSIS — J9601 Acute respiratory failure with hypoxia: Secondary | ICD-10-CM | POA: Diagnosis not present

## 2015-06-27 DIAGNOSIS — Z801 Family history of malignant neoplasm of trachea, bronchus and lung: Secondary | ICD-10-CM

## 2015-06-27 DIAGNOSIS — E11649 Type 2 diabetes mellitus with hypoglycemia without coma: Secondary | ICD-10-CM | POA: Diagnosis present

## 2015-06-27 DIAGNOSIS — R0600 Dyspnea, unspecified: Secondary | ICD-10-CM

## 2015-06-27 DIAGNOSIS — G9341 Metabolic encephalopathy: Secondary | ICD-10-CM | POA: Diagnosis present

## 2015-06-27 DIAGNOSIS — D709 Neutropenia, unspecified: Secondary | ICD-10-CM | POA: Diagnosis present

## 2015-06-27 DIAGNOSIS — F329 Major depressive disorder, single episode, unspecified: Secondary | ICD-10-CM | POA: Diagnosis present

## 2015-06-27 DIAGNOSIS — E872 Acidosis: Secondary | ICD-10-CM | POA: Diagnosis present

## 2015-06-27 DIAGNOSIS — E1151 Type 2 diabetes mellitus with diabetic peripheral angiopathy without gangrene: Secondary | ICD-10-CM | POA: Diagnosis present

## 2015-06-27 DIAGNOSIS — D638 Anemia in other chronic diseases classified elsewhere: Secondary | ICD-10-CM | POA: Diagnosis present

## 2015-06-27 DIAGNOSIS — N170 Acute kidney failure with tubular necrosis: Secondary | ICD-10-CM | POA: Diagnosis not present

## 2015-06-27 DIAGNOSIS — A4189 Other specified sepsis: Principal | ICD-10-CM | POA: Diagnosis present

## 2015-06-27 DIAGNOSIS — K219 Gastro-esophageal reflux disease without esophagitis: Secondary | ICD-10-CM | POA: Diagnosis present

## 2015-06-27 DIAGNOSIS — I509 Heart failure, unspecified: Secondary | ICD-10-CM | POA: Diagnosis present

## 2015-06-27 DIAGNOSIS — Z794 Long term (current) use of insulin: Secondary | ICD-10-CM | POA: Diagnosis not present

## 2015-06-27 DIAGNOSIS — L089 Local infection of the skin and subcutaneous tissue, unspecified: Secondary | ICD-10-CM

## 2015-06-27 DIAGNOSIS — E11319 Type 2 diabetes mellitus with unspecified diabetic retinopathy without macular edema: Secondary | ICD-10-CM | POA: Diagnosis present

## 2015-06-27 DIAGNOSIS — E878 Other disorders of electrolyte and fluid balance, not elsewhere classified: Secondary | ICD-10-CM | POA: Diagnosis present

## 2015-06-27 DIAGNOSIS — T148XXA Other injury of unspecified body region, initial encounter: Secondary | ICD-10-CM

## 2015-06-27 DIAGNOSIS — D649 Anemia, unspecified: Secondary | ICD-10-CM

## 2015-06-27 DIAGNOSIS — E860 Dehydration: Secondary | ICD-10-CM | POA: Diagnosis present

## 2015-06-27 DIAGNOSIS — Z8249 Family history of ischemic heart disease and other diseases of the circulatory system: Secondary | ICD-10-CM

## 2015-06-27 DIAGNOSIS — H409 Unspecified glaucoma: Secondary | ICD-10-CM | POA: Diagnosis present

## 2015-06-27 DIAGNOSIS — E1140 Type 2 diabetes mellitus with diabetic neuropathy, unspecified: Secondary | ICD-10-CM | POA: Diagnosis present

## 2015-06-27 DIAGNOSIS — Z89431 Acquired absence of right foot: Secondary | ICD-10-CM | POA: Diagnosis not present

## 2015-06-27 DIAGNOSIS — Z01818 Encounter for other preprocedural examination: Secondary | ICD-10-CM

## 2015-06-27 DIAGNOSIS — I2510 Atherosclerotic heart disease of native coronary artery without angina pectoris: Secondary | ICD-10-CM | POA: Diagnosis present

## 2015-06-27 DIAGNOSIS — M86271 Subacute osteomyelitis, right ankle and foot: Secondary | ICD-10-CM

## 2015-06-27 DIAGNOSIS — Z4659 Encounter for fitting and adjustment of other gastrointestinal appliance and device: Secondary | ICD-10-CM

## 2015-06-27 DIAGNOSIS — R197 Diarrhea, unspecified: Secondary | ICD-10-CM

## 2015-06-27 LAB — URINALYSIS COMPLETE WITH MICROSCOPIC (ARMC ONLY)
Bilirubin Urine: NEGATIVE
Glucose, UA: NEGATIVE mg/dL
Hgb urine dipstick: NEGATIVE
KETONES UR: NEGATIVE mg/dL
Leukocytes, UA: NEGATIVE
NITRITE: NEGATIVE
PROTEIN: NEGATIVE mg/dL
Specific Gravity, Urine: 1.015 (ref 1.005–1.030)
pH: 5 (ref 5.0–8.0)

## 2015-06-27 LAB — CBC WITH DIFFERENTIAL/PLATELET
BASOS PCT: 0 %
Basophils Absolute: 0 10*3/uL (ref 0–0.1)
EOS ABS: 0 10*3/uL (ref 0–0.7)
Eosinophils Relative: 1 %
HCT: 19.2 % — ABNORMAL LOW (ref 35.0–47.0)
HEMOGLOBIN: 6.2 g/dL — AB (ref 12.0–16.0)
Lymphocytes Relative: 33 %
Lymphs Abs: 0.5 10*3/uL — ABNORMAL LOW (ref 1.0–3.6)
MCH: 27.9 pg (ref 26.0–34.0)
MCHC: 32.3 g/dL (ref 32.0–36.0)
MCV: 86.2 fL (ref 80.0–100.0)
Monocytes Absolute: 0.1 10*3/uL — ABNORMAL LOW (ref 0.2–0.9)
Monocytes Relative: 3 %
Neutro Abs: 1 10*3/uL — ABNORMAL LOW (ref 1.4–6.5)
Neutrophils Relative %: 63 %
Platelets: 101 10*3/uL — ABNORMAL LOW (ref 150–440)
RBC: 2.23 MIL/uL — AB (ref 3.80–5.20)
RDW: 17.3 % — ABNORMAL HIGH (ref 11.5–14.5)
WBC: 1.5 10*3/uL — AB (ref 3.6–11.0)

## 2015-06-27 LAB — GLUCOSE, CAPILLARY
GLUCOSE-CAPILLARY: 131 mg/dL — AB (ref 65–99)
GLUCOSE-CAPILLARY: 102 mg/dL — AB (ref 65–99)
Glucose-Capillary: 36 mg/dL — CL (ref 65–99)
Glucose-Capillary: 41 mg/dL — CL (ref 65–99)

## 2015-06-27 LAB — BLOOD CULTURE ID PANEL (REFLEXED)
ACINETOBACTER BAUMANNII: DETECTED — AB
CANDIDA GLABRATA: NOT DETECTED
Candida albicans: NOT DETECTED
Candida krusei: NOT DETECTED
Candida parapsilosis: NOT DETECTED
Candida tropicalis: NOT DETECTED
Carbapenem resistance: NOT DETECTED
ENTEROBACTERIACEAE SPECIES: NOT DETECTED
ESCHERICHIA COLI: NOT DETECTED
Enterobacter cloacae complex: NOT DETECTED
Enterococcus species: NOT DETECTED
HAEMOPHILUS INFLUENZAE: NOT DETECTED
KLEBSIELLA OXYTOCA: NOT DETECTED
Klebsiella pneumoniae: NOT DETECTED
LISTERIA MONOCYTOGENES: NOT DETECTED
METHICILLIN RESISTANCE: NOT DETECTED
NEISSERIA MENINGITIDIS: NOT DETECTED
PSEUDOMONAS AERUGINOSA: NOT DETECTED
Proteus species: NOT DETECTED
SERRATIA MARCESCENS: NOT DETECTED
STAPHYLOCOCCUS SPECIES: NOT DETECTED
STREPTOCOCCUS AGALACTIAE: NOT DETECTED
STREPTOCOCCUS PYOGENES: NOT DETECTED
STREPTOCOCCUS SPECIES: NOT DETECTED
Staphylococcus aureus (BCID): NOT DETECTED
Streptococcus pneumoniae: NOT DETECTED
Vancomycin resistance: NOT DETECTED

## 2015-06-27 LAB — BLOOD GAS, ARTERIAL
ACID-BASE DEFICIT: 13.4 mmol/L — AB (ref 0.0–2.0)
ACID-BASE DEFICIT: 14.9 mmol/L — AB (ref 0.0–2.0)
ALLENS TEST (PASS/FAIL): POSITIVE — AB
Allens test (pass/fail): POSITIVE — AB
Bicarbonate: 13.1 mEq/L — ABNORMAL LOW (ref 21.0–28.0)
Bicarbonate: 13 mEq/L — ABNORMAL LOW (ref 21.0–28.0)
FIO2: 0.34
FIO2: 0.6
LHR: 16 {breaths}/min
O2 Saturation: 81.7 %
O2 Saturation: 96.8 %
PCO2 ART: 32 mmHg (ref 32.0–48.0)
PCO2 ART: 39 mmHg (ref 32.0–48.0)
PEEP/CPAP: 5 cmH2O
PH ART: 7.22 — AB (ref 7.350–7.450)
PH ART: 7.13 — AB (ref 7.350–7.450)
Patient temperature: 37
Patient temperature: 37
VT: 450 mL
pO2, Arterial: 56 mmHg — ABNORMAL LOW (ref 83.0–108.0)
pO2, Arterial: 114 mmHg — ABNORMAL HIGH (ref 83.0–108.0)

## 2015-06-27 LAB — HEMOGLOBIN
Hemoglobin: 5.7 g/dL — ABNORMAL LOW (ref 12.0–16.0)
Hemoglobin: 6.3 g/dL — ABNORMAL LOW (ref 12.0–16.0)

## 2015-06-27 LAB — MRSA PCR SCREENING: MRSA BY PCR: NEGATIVE

## 2015-06-27 LAB — COMPREHENSIVE METABOLIC PANEL
ALBUMIN: 1.6 g/dL — AB (ref 3.5–5.0)
ALT: 8 U/L — ABNORMAL LOW (ref 14–54)
ANION GAP: 6 (ref 5–15)
AST: 19 U/L (ref 15–41)
Alkaline Phosphatase: 52 U/L (ref 38–126)
BILIRUBIN TOTAL: 0.5 mg/dL (ref 0.3–1.2)
BUN: 62 mg/dL — AB (ref 6–20)
CHLORIDE: 121 mmol/L — AB (ref 101–111)
CO2: 16 mmol/L — AB (ref 22–32)
Calcium: 8.2 mg/dL — ABNORMAL LOW (ref 8.9–10.3)
Creatinine, Ser: 2.68 mg/dL — ABNORMAL HIGH (ref 0.44–1.00)
GFR calc Af Amer: 20 mL/min — ABNORMAL LOW (ref >60)
GFR calc non Af Amer: 17 mL/min — ABNORMAL LOW (ref >60)
GLUCOSE: 66 mg/dL (ref 65–99)
POTASSIUM: 4.8 mmol/L (ref 3.5–5.1)
SODIUM: 143 mmol/L (ref 135–145)
Total Protein: 4.8 g/dL — ABNORMAL LOW (ref 6.5–8.1)

## 2015-06-27 LAB — LIPASE, BLOOD: Lipase: 16 U/L (ref 11–51)

## 2015-06-27 LAB — BRAIN NATRIURETIC PEPTIDE: B Natriuretic Peptide: 2873 pg/mL — ABNORMAL HIGH (ref 0.0–100.0)

## 2015-06-27 LAB — TROPONIN I: TROPONIN I: 0.08 ng/mL — AB (ref <0.031)

## 2015-06-27 LAB — LACTIC ACID, PLASMA
Lactic Acid, Venous: 1.5 mmol/L (ref 0.5–2.0)
Lactic Acid, Venous: 1.2 mmol/L (ref 0.5–2.0)

## 2015-06-27 LAB — C DIFFICILE QUICK SCREEN W PCR REFLEX
C Diff antigen: POSITIVE — AB
C Diff toxin: NEGATIVE — AB

## 2015-06-27 LAB — PREPARE RBC (CROSSMATCH)

## 2015-06-27 LAB — CLOSTRIDIUM DIFFICILE BY PCR: Toxigenic C. Difficile by PCR: POSITIVE — AB

## 2015-06-27 MED ORDER — ACETAMINOPHEN 325 MG PO TABS: 650.0000 mg | ORAL_TABLET | Freq: Four times a day (QID) | ORAL | Status: DC | PRN

## 2015-06-27 MED ORDER — FENTANYL 2500MCG IN NS 250ML (10MCG/ML) PREMIX INFUSION
10.0000 ug/h | INTRAVENOUS | Status: DC
  Administered 2015-06-27: 50 ug/h via INTRAVENOUS
  Administered 2015-06-28: 100 ug/h via INTRAVENOUS
  Filled 2015-06-27: qty 250

## 2015-06-27 MED ORDER — DEXTROSE 5 % AND 0.9 % NACL IV BOLUS
500.0000 mL | Freq: Once | INTRAVENOUS | Status: AC
  Administered 2015-06-27: 500 mL via INTRAVENOUS

## 2015-06-27 MED ORDER — FENTANYL CITRATE (PF) 100 MCG/2ML IJ SOLN
INTRAMUSCULAR | Status: AC
  Administered 2015-06-27: 100 ug via INTRAVENOUS
  Filled 2015-06-27: qty 2

## 2015-06-27 MED ORDER — DEXTROSE 50 % IV SOLN
25.0000 g | Freq: Once | INTRAVENOUS | Status: AC
  Administered 2015-06-27: 25 g via INTRAVENOUS
  Filled 2015-06-27: qty 50

## 2015-06-27 MED ORDER — DEXTROSE 5 % AND 0.9 % NACL IV BOLUS: 500.0000 mL | Freq: Once | INTRAVENOUS | Status: DC

## 2015-06-27 MED ORDER — SIMVASTATIN 20 MG PO TABS
20.0000 mg | ORAL_TABLET | Freq: Every day | ORAL | Status: DC
  Administered 2015-06-27: 20 mg via ORAL
  Filled 2015-06-27: qty 1

## 2015-06-27 MED ORDER — METRONIDAZOLE IN NACL 5-0.79 MG/ML-% IV SOLN
500.0000 mg | Freq: Three times a day (TID) | INTRAVENOUS | Status: DC
  Filled 2015-06-27 (×3): qty 100

## 2015-06-27 MED ORDER — MIDAZOLAM HCL 2 MG/2ML IJ SOLN
2.0000 mg | Freq: Once | INTRAMUSCULAR | Status: AC
  Administered 2015-06-27: 2 mg via INTRAVENOUS

## 2015-06-27 MED ORDER — SODIUM CHLORIDE 0.9 % IV SOLN
10.0000 mL/h | Freq: Once | INTRAVENOUS | Status: DC
Start: 2015-06-27 — End: 2015-06-27

## 2015-06-27 MED ORDER — METRONIDAZOLE 500 MG PO TABS
500.0000 mg | ORAL_TABLET | Freq: Four times a day (QID) | ORAL | Status: DC
  Administered 2015-06-27 – 2015-06-28 (×3): 500 mg via ORAL
  Filled 2015-06-27 (×3): qty 1

## 2015-06-27 MED ORDER — FENTANYL CITRATE (PF) 100 MCG/2ML IJ SOLN
100.0000 ug | Freq: Once | INTRAMUSCULAR | Status: AC
  Administered 2015-06-27: 100 ug via INTRAVENOUS

## 2015-06-27 MED ORDER — NOREPINEPHRINE BITARTRATE 1 MG/ML IV SOLN
0.0000 ug/min | INTRAVENOUS | Status: DC
  Administered 2015-06-27: 40 ug/min via INTRAVENOUS
  Administered 2015-06-28: 50 ug/min via INTRAVENOUS
  Administered 2015-06-28 (×2): 40 ug/min via INTRAVENOUS
  Filled 2015-06-27 (×4): qty 16

## 2015-06-27 MED ORDER — ACETAMINOPHEN 650 MG RE SUPP: 650.0000 mg | Freq: Four times a day (QID) | RECTAL | Status: DC | PRN

## 2015-06-27 MED ORDER — TOBRAMYCIN SULFATE 80 MG/2ML IJ SOLN
7.0000 mg/kg | INTRAVENOUS | Status: DC
  Administered 2015-06-28: 490 mg via INTRAVENOUS
  Filled 2015-06-27: qty 12.25

## 2015-06-27 MED ORDER — DEXTROSE 10 % IV SOLN
INTRAVENOUS | Status: DC
  Administered 2015-06-27: 21:00:00 via INTRAVENOUS

## 2015-06-27 MED ORDER — VASOPRESSIN 20 UNIT/ML IV SOLN
0.0400 [IU]/min | INTRAVENOUS | Status: DC
  Administered 2015-06-27: 0.03 [IU]/min via INTRAVENOUS
  Administered 2015-06-28: 0.04 [IU]/min via INTRAVENOUS
  Filled 2015-06-27 (×2): qty 2

## 2015-06-27 MED ORDER — INSULIN ASPART 100 UNIT/ML ~~LOC~~ SOLN: 0.0000 [IU] | Freq: Three times a day (TID) | SUBCUTANEOUS | Status: DC

## 2015-06-27 MED ORDER — PIPERACILLIN-TAZOBACTAM 3.375 G IVPB
3.3750 g | Freq: Three times a day (TID) | INTRAVENOUS | Status: DC
  Administered 2015-06-27: 3.375 g via INTRAVENOUS
  Filled 2015-06-27 (×3): qty 50

## 2015-06-27 MED ORDER — ONDANSETRON HCL 4 MG PO TABS: 4.0000 mg | ORAL_TABLET | Freq: Four times a day (QID) | ORAL | Status: DC | PRN

## 2015-06-27 MED ORDER — SODIUM CHLORIDE 0.9 % IV BOLUS (SEPSIS)
30.0000 mL/kg | Freq: Once | INTRAVENOUS | Status: AC
  Administered 2015-06-27: 2517 mL via INTRAVENOUS

## 2015-06-27 MED ORDER — SODIUM CHLORIDE 0.9 % IV SOLN
Freq: Once | INTRAVENOUS | Status: AC
  Administered 2015-06-27: 23:00:00 via INTRAVENOUS

## 2015-06-27 MED ORDER — DEXTROSE-NACL 5-0.9 % IV SOLN
INTRAVENOUS | Status: DC
  Administered 2015-06-27: 16:00:00 via INTRAVENOUS

## 2015-06-27 MED ORDER — VANCOMYCIN HCL IN DEXTROSE 1-5 GM/200ML-% IV SOLN
1000.0000 mg | INTRAVENOUS | Status: DC
  Administered 2015-06-27: 1000 mg via INTRAVENOUS
  Filled 2015-06-27 (×2): qty 200

## 2015-06-27 MED ORDER — VANCOMYCIN 50 MG/ML ORAL SOLUTION
125.0000 mg | Freq: Four times a day (QID) | ORAL | Status: DC
  Administered 2015-06-27 – 2015-06-28 (×4): 125 mg via ORAL
  Filled 2015-06-27 (×8): qty 2.5

## 2015-06-27 MED ORDER — SODIUM CHLORIDE 0.9 % IV SOLN: INTRAVENOUS | Status: DC

## 2015-06-27 MED ORDER — PANTOPRAZOLE SODIUM 40 MG PO TBEC: 40.0000 mg | DELAYED_RELEASE_TABLET | Freq: Every day | ORAL | Status: DC

## 2015-06-27 MED ORDER — ONDANSETRON HCL 4 MG/2ML IJ SOLN: 4.0000 mg | Freq: Four times a day (QID) | INTRAMUSCULAR | Status: DC | PRN

## 2015-06-27 MED ORDER — FERROUS SULFATE 325 (65 FE) MG PO TABS
325.0000 mg | ORAL_TABLET | Freq: Every day | ORAL | Status: DC
  Administered 2015-06-28 (×2): 325 mg via ORAL
  Filled 2015-06-27 (×2): qty 1

## 2015-06-27 MED ORDER — MIRTAZAPINE 15 MG PO TABS
15.0000 mg | ORAL_TABLET | Freq: Every day | ORAL | Status: DC
  Administered 2015-06-27: 15 mg via ORAL
  Filled 2015-06-27: qty 1

## 2015-06-27 MED ORDER — HEPARIN SODIUM (PORCINE) 5000 UNIT/ML IJ SOLN
5000.0000 [IU] | Freq: Three times a day (TID) | INTRAMUSCULAR | Status: DC
  Administered 2015-06-27 – 2015-06-28 (×3): 5000 [IU] via SUBCUTANEOUS
  Filled 2015-06-27 (×3): qty 1

## 2015-06-27 MED ORDER — INSULIN ASPART 100 UNIT/ML ~~LOC~~ SOLN: 0.0000 [IU] | Freq: Every day | SUBCUTANEOUS | Status: DC

## 2015-06-27 MED ORDER — VECURONIUM BROMIDE 10 MG IV SOLR
10.0000 mg | Freq: Once | INTRAVENOUS | Status: AC
  Administered 2015-06-27: 10 mg via INTRAVENOUS

## 2015-06-27 MED ORDER — DEXTROSE 5 % IV SOLN
0.0000 ug/min | INTRAVENOUS | Status: DC
  Administered 2015-06-27: 10 ug/min via INTRAVENOUS
  Filled 2015-06-27: qty 4

## 2015-06-27 MED ORDER — LACTATED RINGERS IV SOLN
INTRAVENOUS | Status: DC
Start: 2015-06-27 — End: 2015-06-28
  Administered 2015-06-27: 21:00:00 via INTRAVENOUS

## 2015-06-27 MED ORDER — CALCIUM CARBONATE-VITAMIN D 500-200 MG-UNIT PO TABS
1.0000 | ORAL_TABLET | Freq: Two times a day (BID) | ORAL | Status: DC
  Administered 2015-06-27: 1 via ORAL
  Filled 2015-06-27: qty 1

## 2015-06-27 MED ORDER — SODIUM CHLORIDE 0.9 % IV SOLN
500.0000 mg | Freq: Two times a day (BID) | INTRAVENOUS | Status: DC
  Administered 2015-06-27 – 2015-06-28 (×2): 500 mg via INTRAVENOUS
  Filled 2015-06-27 (×4): qty 0.5

## 2015-06-27 MED ORDER — MIDAZOLAM HCL 2 MG/2ML IJ SOLN
INTRAMUSCULAR | Status: AC
  Administered 2015-06-27: 2 mg via INTRAVENOUS
  Filled 2015-06-27: qty 2

## 2015-06-27 NOTE — ED Notes (Signed)
Report called to Sandra, RN

## 2015-06-27 NOTE — Progress Notes (Signed)
Called to evaluate patient as she was very hypotensive, having labored breathing. Patient was admitted earlier for septic shock. -Patient was also noted to be positive for toxigenic C. difficile and is having ongoing diarrhea presently.  Physical Exam:  Gen: Critically ill patient having some labored breathing and remains encephlopathic.  Heart - S1, S2, no murmurs, rubs, clicks. Lungs - bibasilar crackles.  Abd- soft, NT, ND, + BS no organomegaly Ext - no cyanosis, clubbing, edema b/l.  RLE dressing from recent transmetatarsal amputations and Osteomyelitis.   Labs:  ABG    Component Value Date/Time   PHART 7.22* 06/10/2015 1700   PCO2ART 32 06/11/2015 1700   PO2ART 56* 06/29/2015 1700   HCO3 13.1* 06/21/2015 1700   ACIDBASEDEF 13.4* 07/01/2015 1700   O2SAT 81.7 06/22/2015 1700    Assessment & Plan  # 1 acute respiratory failure-patient noted to have labored breathing and noted to be in acute respiratory failure with hypoxia and also underlying metabolic acidosis. -Patient is critically ill and severely hypotensive with multiorgan issues. -Patient given Versed, vecuronium and intubated with the help of respiratory. -We'll get post intubation chest x-ray.  -Also spoke to intensivist Dr. Ashby Dawes to come evaluate patient.   #2 C. Diff colitis w/ diarrhea - started on IV Flagyl and once OG tube placed pt. Can be switched to Oral Flagyl.   #3 Septic Shock - will start on IV levophed.  - cont. IV Vanco, Zosyn.  Cont. IV fluids.  Follow cultures.   Spoke to patient's family and updated them on patient's plan of care.  Critical Care time Spent: 45 min.

## 2015-06-27 NOTE — Progress Notes (Signed)
Per Dr Verdell Carmine re-draw hemoglobin.

## 2015-06-27 NOTE — Progress Notes (Signed)
Added vent order via Dr. Gladstone Lighter previous notes and being at bedside for pt. Intubation. Vent settings are PRVC Vt 450, RR 16, FIO2 .60, Peep 5.

## 2015-06-27 NOTE — Consult Note (Signed)
[facility] Consultation     ASSESSMENT/PLAN   70 year old female with osteomyelitis, status post transmetatarsal amputation with history of C. difficile. Now with septic shock, dehydration, diarrhea. Intubated due to respiratory distress and encephalopathy.  PULMONARY  A: Acute respiratory failure, likely due to severe septic shock and metabolic encephalopathy. ABG reviewed.  7.13/39/114/13, consistent with severe metabolic acidosis. -Ventilator dependent. P:   -Continue current vent support settings, PRVC/16/450/5.  Intubated 2/23 Chest x-ray and abdominal x-ray images from 2/23 reviewed: Bibasilar atelectasis, adequate placement of PT and orogastric tubes.  CARDIOVASCULAR  A: Severe septic shock with hypotension. P:  -Continue IV fluid resuscitation. -Hyperchloremic metabolic acidosis, will change fluids to lactated Ringer's. -Monitor CVP. -Titrate Levophed to maintain map of 65 or greater, may need to add vasopressin if needed.\  DVT prophylaxis: We'll start subcutaneous heparin.  RENAL A:  Acute kidney injury on chronic kidney disease. P:   -Continue IV fluids, continue to monitor renal function and urine output.  GASTROINTESTINAL A: Diarrhea with dehydration, C. difficile PCR positive, await toxin results. P:   Continue IV Flagyl, start oral vancomycin. -We'll await further infectious disease input.  GI prophylaxis: PPI.  HEMATOLOGIC A:  Anemia, uncertain etiology. P:  Monitor CBC, transfuse as needed.   INFECTIOUS A:  Septic shock, uncertain etiology, could be from lower extremity wound versus C. Difficile.  -? Line sepsis . PICC line appears clean at this time. P:   As above and vancomycin, ID is consulted. -We'll obtain cultures from PICC line and peripherally. -We will remove the PICC if line cultures are positive.  Microbiology results: 2/23 BCx: in process 2/23 wound: in process C. difficile PCR 2/23: Positive Urine  culture 2/23: Pending Blood culture 2  2/23: Pending   Antimicrobials this admission: zosyn 2/23 >>  IV vancomycin 2/23 >>  Oral vancomycin 2/23>> IV Flagyl 2/23>>  ENDOCRINE A:  Hypoglycemia P:   Monitor blood glucoses. Start D10 at 25 mL per hour.  NEUROLOGIC A:  Metabolic encephalopathy, due to illness. P:   Continue on sedation per protocol, currently on fentanyl.    MAJOR EVENTS/TEST RESULTS: Intubated 06/30/2015.  INDWELLING DEVICES:: Intubated 2/23. OG tube. 2/23. Right dual lumen PICC line placed 05/29/15.    ---------------------------------------  ---------------------------------------   Name: [name] MRN: [id number] DOB: 03-20-1946    ADMISSION DATE:  06/17/2015 CONSULTATION DATE:  07/02/2015  REFERRING MD :  Dr. Verdell Carmine  CHIEF COMPLAINT:  Dyspnea.    HISTORY OF PRESENT ILLNESS:    The patient is a 70 year old female. She is currently intubated and on the ventilator, therefore, all history is obtained from the chart and from staff. She underwent a recent right foot amputation due to gangrene and osteomyelitis. While at [facility]. The patient was noted to have fever, tachycardia and altered mental status. She has a history of CKD3 She has a known history of CHF and cardiomyopathy. Review of most recent echocardiogram from 09/17/14, shows a ejection fraction of 60%. She had a recent admission to this facility and was discharged on 06/10/15, that time she had ostial mellitus of the right foot, status post transmetatarsal amputation revision on 06/07/15, she was continued on a course of Zosyn and Bactrim given through a right antecubital PICC line placed on a previous hospitalization. She was discharged to [facility].   Upon evaluation here at the hospital. He was noted that she was significantly anemic with a hemoglobin of 6.2, which was down from discharge of 8.7. BNP was  2873, troponin of 0.08. On her chemistry panel. The anion gap was 6,  however, the patient was hyperchloremic, with a chloride level of 121.  The patient originally arrived to the hospital with a blood pressure of 104, 54, SPO2 was 95% on room air, subsequently, this was increased to 3 L. The patient's blood pressures decreased to a low of 58/44, subsequent was given fluid boluses, was started on , currently at 25 mics, the patient was intubated due to respiratory distress, and altered mental status.   PAST MEDICAL HISTORY :  Past Medical History  Diagnosis Date  . Hypertension   . Diabetes mellitus without complication ([place])   . Anemia   . Coronary artery disease   . CHF (congestive heart failure) ([place])   . Glaucoma   . Hyperlipidemia   . Neuropathy ([place])   . Chronic anemia   . Stroke ([place])   . Anginal pain ([place])   . Arthritis   . Retinopathy   . Cardiomyopathy ([place])   . Carotid artery stenosis   . Hoarseness, chronic   . GERD (gastroesophageal reflux disease)   . Hemangioma of liver   . Weight loss, unintentional   . DDD (degenerative disc disease), lumbar   . Osteomyelitis of foot, right, acute ([place])   . Chronic kidney disease     Stage 3   Past Surgical History  Procedure Laterality Date  . Cardiac catheterization    . Esophagogastroduodenoscopy (egd) with propofol N/A 09/24/2014    Elliott-gastritis & normal Billroth II changes   . Colonoscopy with propofol N/A 09/24/2014    Elliott-Incomplete secondary to prep  . Coronary angioplasty  2016    stent placed  . Bilroth ii procedure    . Colonoscopy with propofol N/A 11/12/2014    Procedure: COLONOSCOPY WITH PROPOFOL;  Surgeon: Manya Silvas, MD;  Location: [facility] ENDOSCOPY;  Service: Endoscopy;  Laterality: N/A;  Trudee Kuster hole Right 12/06/2014    Procedure: Right burr hole  for subdural hematoma;  Surgeon: Newman Pies, MD;  Location: [facility] NEURO ORS;  Service: Neurosurgery;  Laterality: Right;  . Irrigation and debridement foot Right 02/01/2015    Procedure: IRRIGATION AND DEBRIDEMENT  FOOT/RIGHT GREAT TOE;  Surgeon: [name], DPM;  Location: [facility] ORS;  Service: Podiatry;  Laterality: Right;  . Eye surgery Right     cataract removed  . Amputation toe Right 03/01/2015    Procedure: AMPUTATION  RIGHT GREAT TOE;  Surgeon: [name], DPM;  Location: [facility] ORS;  Service: Podiatry;  Laterality: Right;  . Irrigation and debridement foot Right 04/03/2015    Procedure: IRRIGATION AND DEBRIDEMENT FOOT;  Surgeon: Sharlotte Alamo, MD;  Location: [facility] ORS;  Service: Podiatry;  Laterality: Right;  . Peripheral vascular catheterization N/A 04/04/2015    Procedure: Abdominal Aortogram w/Lower Extremity;  Surgeon: Algernon Huxley, MD;  Location: [place] CV LAB;  Service: Cardiovascular;  Laterality: N/A;  . Peripheral vascular catheterization  04/04/2015    Procedure: Lower Extremity Intervention;  Surgeon: Algernon Huxley, MD;  Location: [place] CV LAB;  Service: Cardiovascular;;  . Brain surgery    . Transmetatarsal amputation Right 05/28/2015    Procedure: TRANSMETATARSAL AMPUTATION;  Surgeon: Sharlotte Alamo, DPM;  Location: [facility] ORS;  Service: Podiatry;  Laterality: Right;  . Peripheral vascular catheterization N/A 06/06/2015    Procedure: Abdominal Aortogram w/Lower Extremity;  Surgeon: Algernon Huxley, MD;  Location: [place] CV LAB;  Service: Cardiovascular;  Laterality: N/A;  . Peripheral vascular catheterization  06/06/2015  Procedure: Lower Extremity Intervention;  Surgeon: Algernon Huxley, MD;  Location: [place] CV LAB;  Service: Cardiovascular;;  . Transmetatarsal amputation Right 06/07/2015    Procedure: TRANSMETATARSAL AMPUTATION/ REVISION;  Surgeon: [name], DPM;  Location: [facility] ORS;  Service: Podiatry;  Laterality: Right;  . Application of wound vac Right 06/07/2015    Procedure: APPLICATION OF WOUND VAC;  Surgeon: [name], DPM;  Location: [facility] ORS;  Service: Podiatry;  Laterality: Right;   Prior to Admission medications   Medication Sig Start Date End Date Taking?  Authorizing Provider  acetaminophen (TYLENOL) 325 MG tablet Take 2 tablets (650 mg total) by mouth every 4 (four) hours as needed for mild pain (or temp > 99 F). 12/13/14  Yes Delfina Redwood, MD  Calcium Carbonate-Vitamin D (CALCIUM 600+D) 600-400 MG-UNIT tablet Take 1 tablet by mouth 2 (two) times daily.   Yes Historical Provider, MD  cloNIDine (CATAPRES) 0.2 MG tablet Take 1 tablet (0.2 mg total) by mouth 3 (three) times daily. 04/08/15  Yes Demetrios Loll, MD  docusate sodium (COLACE) 100 MG capsule Take 1 capsule (100 mg total) by mouth 2 (two) times daily. 02/05/15  Yes Lucilla Lame, MD  ferrous sulfate 325 (65 FE) MG tablet Take 325 mg by mouth daily. Reported on 06/04/2015   Yes Historical Provider, MD  folic acid (FOLVITE) 1 MG tablet Take 1 tablet (1 mg total) by mouth daily. 02/22/15  Yes Lequita Asal, MD  hydrALAZINE (APRESOLINE) 100 MG tablet Take 100 mg by mouth 3 (three) times daily.    Yes Historical Provider, MD  insulin aspart (NOVOLOG) 100 UNIT/ML injection Inject 0-9 Units into the skin 3 (three) times daily with meals. Patient taking differently: Inject 0-9 Units into the skin 2 (two) times daily. On Monday, Wednesday, Friday, Saturday 05/30/15  Yes Fritzi Mandes, MD  isosorbide dinitrate (ISORDIL) 30 MG tablet Take 30 mg by mouth 2 (two) times daily. Reported on 05/27/2015   Yes Historical Provider, MD  labetalol (NORMODYNE) 100 MG tablet Take 100 mg by mouth 2 (two) times daily. Reported on 05/27/2015   Yes Historical Provider, MD  mirtazapine (REMERON) 15 MG tablet Take 15 mg by mouth at bedtime. Reported on 05/27/2015   Yes Historical Provider, MD  nitroGLYCERIN (NITROSTAT) 0.4 MG SL tablet Place 0.4 mg under the tongue every 5 (five) minutes as needed for chest pain.   Yes Historical Provider, MD  OMEGA-3 FATTY ACIDS PO Take 1 capsule by mouth daily. Reported on 05/27/2015   Yes Historical Provider, MD  omeprazole (PRILOSEC) 20 MG capsule Take 20 mg by mouth daily.   Yes Historical  Provider, MD  oxyCODONE-acetaminophen (ROXICET) 5-325 MG tablet Take 1-2 tablets by mouth every 4 (four) hours as needed for severe pain. 06/10/15  Yes Sital Mody, MD  piperacillin-tazobactam (ZOSYN) 3.375 (3-0.375) g injection Inject 3.375 g into the muscle every 8 (eight) hours.   Yes Historical Provider, MD  potassium chloride SA (K-DUR,KLOR-CON) 20 MEQ tablet Take 1 tablet (20 mEq total) by mouth daily. 11/02/14  Yes Alisa Graff, FNP  simvastatin (ZOCOR) 20 MG tablet Take 20 mg by mouth at bedtime. Reported on 05/27/2015   Yes Historical Provider, MD  sulfamethoxazole-trimethoprim (BACTRIM) 400-80 MG tablet Take 1 tablet by mouth 2 (two) times daily. 06/10/15 07/02/15 Yes Sital Mody, MD  torsemide (DEMADEX) 20 MG tablet Take 20 mg by mouth daily.   Yes Historical Provider, MD  VANCOMYCIN HCL IV Inject 500 mg into the vein daily. 06/26/15  Yes Historical Provider, MD   Allergies  Allergen Reactions  . Codeine Other (See Comments)    Reaction:  Hallucinations    FAMILY HISTORY:  Family History  Problem Relation Age of Onset  . Diabetes Mother   . Heart disease Father   . Diabetes Sister   . Lung cancer Brother   . Cancer Brother   . Diabetes Brother   . Diabetes Sister    SOCIAL HISTORY:  reports that she has never smoked. She has never used smokeless tobacco. She reports that she does not drink alcohol or use illicit drugs.  REVIEW OF SYSTEMS:   Could not obtain due to critical illness.   VITAL SIGNS: Temp:  [97.1 F (36.2 C)-100.8 F (38.2 C)] 97.1 F (36.2 C) (02/23 1503) Pulse Rate:  [74-117] 83 (02/23 1745) Resp:  [15-29] 15 (02/23 1745) BP: (58-123)/(36-76) 102/48 mmHg (02/23 1745) SpO2:  [84 %-100 %] 100 % (02/23 1745) FiO2 (%):  [60 %] 60 % (02/23 1745) Weight:  [185 lb (83.915 kg)] 185 lb (83.915 kg) (02/23 0912) HEMODYNAMICS:   VENTILATOR SETTINGS: Vent Mode:  [-] PRVC FiO2 (%):  [60 %] 60 % Set Rate:  [16 bmp] 16 bmp Vt Set:  [450 mL] 450 mL PEEP:  [5 cmH20]  5 cmH20 INTAKE / OUTPUT: No intake or output data in the 24 hours ending 06/15/2015 1839  Physical Examination:   VS: BP 102/48 mmHg  Pulse 83  Temp(Src) 97.1 F (36.2 C) (Axillary)  Resp 15  Ht 5\' 7"  (1.702 m)  Wt 185 lb (83.915 kg)  BMI 28.97 kg/m2  SpO2 100%  General Appearance: No distress  Neuro:without focal findings, mental status, reduced due to sedation. HEENT: PERRLA,  no other lesions noticed;  Pulmonary: normal breath sounds., Decreased air entry bilaterally. CardiovascularNormal S1,S2.  No m/r/g.    Abdomen: Benign, Soft, non-tender, No masses, Renal:  No costovertebral tenderness  GU:  Not performed at this time. Endoc: No evident thyromegaly, no signs of acromegaly. Skin:   warm, no rashes, no ecchymosis  Extremities: normal, no cyanosis, clubbing, no edema, right extremity wrapped   LABS: Reviewed   LABORATORY PANEL:   CBC  Recent Labs Lab 06/26/2015 0940 06/29/2015 1710  WBC 1.5*  --   HGB 6.2* 5.7*  HCT 19.2*  --   PLT 101*  --     Chemistries   Recent Labs Lab 06/29/2015 0940  NA 143  K 4.8  CL 121*  CO2 16*  GLUCOSE 66  BUN 62*  CREATININE 2.68*  CALCIUM 8.2*  AST 19  ALT 8*  ALKPHOS 52  BILITOT 0.5     Recent Labs Lab 06/16/2015 1548 06/20/2015 1557 06/21/2015 1641 06/24/2015 1749  GLUCAP 36* 41* 131* 102*    Recent Labs Lab 06/18/2015 1700 06/30/2015 1810  PHART 7.22* 7.13*  PCO2ART 32 39  PO2ART 56* 114*    Recent Labs Lab 06/18/2015 0940  AST 19  ALT 8*  ALKPHOS 52  BILITOT 0.5  ALBUMIN 1.6*    Cardiac Enzymes  Recent Labs Lab 06/07/2015 0940  TROPONINI 0.08*    RADIOLOGY:  Dg Chest 1 View  06/08/2015  CLINICAL DATA:  Status post intubation. EXAM: CHEST 1 VIEW COMPARISON:  Earlier the same day FINDINGS: 1808 hours. Endotracheal tube tip is 3.2 cm above the base of the carina. The NG tube passes into the stomach although the distal tip position is not included on the film. Right PICC line tip overlies the mid SVC  level. Patient is rotated to the right. The cardio pericardial silhouette is enlarged. There is pulmonary vascular congestion without overt pulmonary edema. Interstitial pulmonary edema is suspected. Small bilateral pleural effusions are noted. Telemetry leads overlie the chest. IMPRESSION: Endotracheal tube tip is 3.2 cm above the base of the carina. Cardiomegaly with vascular congestion and interstitial pulmonary edema. Small bilateral pleural effusions. Electronically Signed   By: Misty Stanley M.D.   On: 06/10/2015 18:22   Dg Abd 1 View  06/19/2015  CLINICAL DATA:  Enteric tube placement. EXAM: ABDOMEN - 1 VIEW COMPARISON:  10/06/2014 abdominal radiograph. FINDINGS: Enteric tube terminates in the body of the stomach. Enteric tube side port is intragastric. Mild gaseous distention of the stomach. No appreciable dilated small bowel loops. No evidence of pneumatosis or pneumoperitoneum. Moderate degenerative changes in the visualized thoracolumbar spine. Vascular calcifications throughout the soft tissues. IMPRESSION: Enteric tube terminates in the body of the stomach. Electronically Signed   By: Ilona Sorrel M.D.   On: 06/24/2015 18:23   Dg Chest Port 1 View  06/18/2015  CLINICAL DATA:  Sepsis.  Code sepsis. EXAM: PORTABLE CHEST 1 VIEW COMPARISON:  05/29/2015 FINDINGS: PICC line tip in the lower SVC region. Patchy densities in the lower chest bilaterally. Heart size is enlarged. Negative for pneumothorax. IMPRESSION: Slightly increased densities in the lower chest bilaterally. Findings probably represent a combination of atelectasis and pleural fluid. Electronically Signed   By: Markus Daft M.D.   On: 06/19/2015 10:15   Dg Foot Complete Right  06/13/2015  CLINICAL DATA:  Right foot amputation EXAM: RIGHT FOOT COMPLETE - 3+ VIEW COMPARISON:  01/29/2015 FINDINGS: Midfoot amputation at the Lisfranc joint. Small bone fragments at the osteotomy /disarticulation may be postoperative. No gross bony erosion. There  is extensive soft tissue gas in the overlying soft tissue flap, which likely reaches the cuboid. Negative for acute fracture. 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 When allowing for postoperative bone fragments there is no convincing osseous erosion. Electronically Signed   By: Monte Fantasia M.D.   On: 06/14/2015 10:21       --Marda Stalker, MD.  Board Certified in Internal Medicine, Pulmonary Medicine, [place], and Sleep Medicine.  Pager [phone] [facility] [place]   Patricia Pesa, M.D.  Vilinda Boehringer, M.D.  Merton Border, M.D   06/27/2015, 6:39 PM  [place].  I have personally obtained a history, examined the patient, evaluated laboratory and imaging results, formulated the assessment and plan and placed orders. The Patient requires high complexity decision making for assessment and support, frequent evaluation and titration of therapies, application of advanced monitoring technologies and extensive interpretation of multiple databases. The patient has critical illness that could lead imminently to failure of 1 or more organ systems and requires the highest level of physician preparedness to intervene.  Critical Care Time devoted to patient care services described in this note is 45 minutes and is exclusive of time spent in procedures.

## 2015-06-27 NOTE — H&P (Addendum)
[place] [facility] [place] NAME: [name]    MR#:  [id number]  DATE OF BIRTH:  1945-10-29  DATE OF ADMISSION:  06/08/2015  PRIMARY CARE PHYSICIAN: [facility]   REQUESTING/REFERRING PHYSICIAN: Dr. Lisa Roca  CHIEF COMPLAINT:   Chief Complaint  Patient presents with  . Code Sepsis    HISTORY OF PRESENT ILLNESS:  [name]  is a 70 y.o. female with a known history of diabetes type 2 without complication, chronic anemia, chronic kidney disease stage III, hypertension, glaucoma, diabetic neuropathy, history of previous CVA, history of carotid artery stenosis, GERD, degenerative disc disease, osteomyelitis who presents to the hospital due to altered mental status and noted to be hypotensive tachycardic and suspected to have sepsis. Patient was recently hospitalized and treated for osteomyelitis of the right foot and underwent transmetatarsal amputations. She was discharged to a skilled nursing facility on IV Zosyn, oral Bactrim and recently IV vancomycin was also added. Despite aggressive IV antibiotic therapy patient presented to the hospital with hypotension, tachycardia and noted to be septic. Hospitalist services were contacted further treatment and evaluation. Patient was also incidentally noted to be anemic with a hemoglobin of 6.2 and her hemoglobin on discharge about 3 weeks ago was 8.5.  PAST MEDICAL HISTORY:   Past Medical History  Diagnosis Date  . Hypertension   . Diabetes mellitus without complication ([place])   . Anemia   . Coronary artery disease   . CHF (congestive heart failure) ([place])   . Glaucoma   . Hyperlipidemia   . Neuropathy ([place])   . Chronic anemia   . Stroke ([place])   . Anginal pain ([place])   . Arthritis   . Retinopathy   . Cardiomyopathy ([place])   . Carotid artery stenosis   . Hoarseness, chronic   . GERD (gastroesophageal reflux disease)   . Hemangioma of liver   . Weight loss, unintentional    . DDD (degenerative disc disease), lumbar   . Osteomyelitis of foot, right, acute ([place])   . Chronic kidney disease     Stage 3    PAST SURGICAL HISTORY:   Past Surgical History  Procedure Laterality Date  . Cardiac catheterization    . Esophagogastroduodenoscopy (egd) with propofol N/A 09/24/2014    Elliott-gastritis & normal Billroth II changes   . Colonoscopy with propofol N/A 09/24/2014    Elliott-Incomplete secondary to prep  . Coronary angioplasty  2016    stent placed  . Bilroth ii procedure    . Colonoscopy with propofol N/A 11/12/2014    Procedure: COLONOSCOPY WITH PROPOFOL;  Surgeon: Manya Silvas, MD;  Location: [facility] ENDOSCOPY;  Service: Endoscopy;  Laterality: N/A;  Trudee Kuster hole Right 12/06/2014    Procedure: Right burr hole  for subdural hematoma;  Surgeon: Newman Pies, MD;  Location: [facility] NEURO ORS;  Service: Neurosurgery;  Laterality: Right;  . Irrigation and debridement foot Right 02/01/2015    Procedure: IRRIGATION AND DEBRIDEMENT FOOT/RIGHT GREAT TOE;  Surgeon: [name], DPM;  Location: [facility] ORS;  Service: Podiatry;  Laterality: Right;  . Eye surgery Right     cataract removed  . Amputation toe Right 03/01/2015    Procedure: AMPUTATION  RIGHT GREAT TOE;  Surgeon: [name], DPM;  Location: [facility] ORS;  Service: Podiatry;  Laterality: Right;  . Irrigation and debridement foot Right 04/03/2015    Procedure: IRRIGATION AND DEBRIDEMENT FOOT;  Surgeon: Sharlotte Alamo, MD;  Location: [facility] ORS;  Service: Podiatry;  Laterality: Right;  . Peripheral vascular catheterization N/A 04/04/2015    Procedure: Abdominal Aortogram w/Lower Extremity;  Surgeon: Algernon Huxley, MD;  Location: [place] CV LAB;  Service: Cardiovascular;  Laterality: N/A;  . Peripheral vascular catheterization  04/04/2015    Procedure: Lower Extremity Intervention;  Surgeon: Algernon Huxley, MD;  Location: [place] CV LAB;  Service: Cardiovascular;;  . Brain surgery    . Transmetatarsal amputation  Right 05/28/2015    Procedure: TRANSMETATARSAL AMPUTATION;  Surgeon: Sharlotte Alamo, DPM;  Location: [facility] ORS;  Service: Podiatry;  Laterality: Right;  . Peripheral vascular catheterization N/A 06/06/2015    Procedure: Abdominal Aortogram w/Lower Extremity;  Surgeon: Algernon Huxley, MD;  Location: [place] CV LAB;  Service: Cardiovascular;  Laterality: N/A;  . Peripheral vascular catheterization  06/06/2015    Procedure: Lower Extremity Intervention;  Surgeon: Algernon Huxley, MD;  Location: [place] CV LAB;  Service: Cardiovascular;;  . Transmetatarsal amputation Right 06/07/2015    Procedure: TRANSMETATARSAL AMPUTATION/ REVISION;  Surgeon: [name], DPM;  Location: [facility] ORS;  Service: Podiatry;  Laterality: Right;  . Application of wound vac Right 06/07/2015    Procedure: APPLICATION OF WOUND VAC;  Surgeon: [name], DPM;  Location: [facility] ORS;  Service: Podiatry;  Laterality: Right;    SOCIAL HISTORY:   Social History  Substance Use Topics  . Smoking status: Never Smoker   . Smokeless tobacco: Never Used     Comment: quit many years ago  . Alcohol Use: No    FAMILY HISTORY:   Family History  Problem Relation Age of Onset  . Diabetes Mother   . Heart disease Father   . Diabetes Sister   . Lung cancer Brother   . Cancer Brother   . Diabetes Brother   . Diabetes Sister     DRUG ALLERGIES:   Allergies  Allergen Reactions  . Codeine Other (See Comments)    Reaction:  Hallucinations    REVIEW OF SYSTEMS:   Review of Systems  Unable to perform ROS: mental acuity    MEDICATIONS AT HOME:   Prior to Admission medications   Medication Sig Start Date End Date Taking? Authorizing Provider  acetaminophen (TYLENOL) 325 MG tablet Take 2 tablets (650 mg total) by mouth every 4 (four) hours as needed for mild pain (or temp > 99 F). 12/13/14  Yes Delfina Redwood, MD  Calcium Carbonate-Vitamin D (CALCIUM 600+D) 600-400 MG-UNIT tablet Take 1 tablet by mouth 2 (two) times daily.    Yes Historical Provider, MD  cloNIDine (CATAPRES) 0.2 MG tablet Take 1 tablet (0.2 mg total) by mouth 3 (three) times daily. 04/08/15  Yes Demetrios Loll, MD  docusate sodium (COLACE) 100 MG capsule Take 1 capsule (100 mg total) by mouth 2 (two) times daily. 02/05/15  Yes Lucilla Lame, MD  ferrous sulfate 325 (65 FE) MG tablet Take 325 mg by mouth daily. Reported on 06/04/2015   Yes Historical Provider, MD  folic acid (FOLVITE) 1 MG tablet Take 1 tablet (1 mg total) by mouth daily. 02/22/15  Yes Lequita Asal, MD  hydrALAZINE (APRESOLINE) 100 MG tablet Take 100 mg by mouth 3 (three) times daily.    Yes Historical Provider, MD  insulin aspart (NOVOLOG) 100 UNIT/ML injection Inject 0-9 Units into the skin 3 (three) times daily with meals. Patient taking differently: Inject 0-9 Units into the skin 2 (two) times daily. On Monday, Wednesday, Friday, Saturday 05/30/15  Yes Fritzi Mandes, MD  isosorbide dinitrate (ISORDIL) 30  MG tablet Take 30 mg by mouth 2 (two) times daily. Reported on 05/27/2015   Yes Historical Provider, MD  labetalol (NORMODYNE) 100 MG tablet Take 100 mg by mouth 2 (two) times daily. Reported on 05/27/2015   Yes Historical Provider, MD  mirtazapine (REMERON) 15 MG tablet Take 15 mg by mouth at bedtime. Reported on 05/27/2015   Yes Historical Provider, MD  nitroGLYCERIN (NITROSTAT) 0.4 MG SL tablet Place 0.4 mg under the tongue every 5 (five) minutes as needed for chest pain.   Yes Historical Provider, MD  OMEGA-3 FATTY ACIDS PO Take 1 capsule by mouth daily. Reported on 05/27/2015   Yes Historical Provider, MD  omeprazole (PRILOSEC) 20 MG capsule Take 20 mg by mouth daily.   Yes Historical Provider, MD  oxyCODONE-acetaminophen (ROXICET) 5-325 MG tablet Take 1-2 tablets by mouth every 4 (four) hours as needed for severe pain. 06/10/15  Yes Bettey Costa, MD  potassium chloride SA (K-DUR,KLOR-CON) 20 MEQ tablet Take 1 tablet (20 mEq total) by mouth daily. 11/02/14  Yes Alisa Graff, FNP  simvastatin  (ZOCOR) 20 MG tablet Take 20 mg by mouth at bedtime. Reported on 05/27/2015   Yes Historical Provider, MD  sulfamethoxazole-trimethoprim (BACTRIM) 400-80 MG tablet Take 1 tablet by mouth 2 (two) times daily. 06/10/15 07/02/15 Yes Sital Mody, MD  torsemide (DEMADEX) 20 MG tablet Take 20 mg by mouth daily.   Yes Historical Provider, MD      VITAL SIGNS:  Blood pressure 99/49, pulse 117, temperature 100.8 F (38.2 C), temperature source Oral, resp. rate 24, height 5\' 7"  (1.702 m), weight 83.915 kg (185 lb), SpO2 99 %.  PHYSICAL EXAMINATION:  Physical Exam  GENERAL:  70 y.o.-year-old patient lying in the bed confused, encephalopathic but in no apparent distress. EYES: Pupils equal, round, reactive to light and accommodation. No scleral icterus. Extraocular muscles intact.  HEENT: Head atraumatic, normocephalic. Oropharynx and nasopharynx clear. No oropharyngeal erythema, moist oral mucosa  NECK:  Supple, no jugular venous distention. No thyroid enlargement, no tenderness.  LUNGS: Normal breath sounds bilaterally, no wheezing, rales, rhonchi. No use of accessory muscles of respiration.  CARDIOVASCULAR: S1, S2 Tachycardic. No murmurs, rubs, gallops, clicks.  ABDOMEN: Soft, nontender, nondistended. Bowel sounds present. No organomegaly or mass.  EXTREMITIES: No pedal edema, cyanosis, or clubbing. Right foot dressing in place. As per nursing staff patient has a flap on the right foot which looks dehisced and also has eschared over.   NEUROLOGIC: Cranial nerves II through XII are intact. No focal Motor or sensory deficits appreciated b/l.  Globally weak.  PSYCHIATRIC: The patient is alert and oriented x 1. Encephalopathic and confused.  SKIN: No obvious rash, lesion, or ulcer.   LABORATORY PANEL:   CBC  Recent Labs Lab 06/13/2015 0940  WBC 1.5*  HGB 6.2*  HCT 19.2*  PLT 101*    ------------------------------------------------------------------------------------------------------------------  Chemistries   Recent Labs Lab 06/24/2015 0940  NA 143  K 4.8  CL 121*  CO2 16*  GLUCOSE 66  BUN 62*  CREATININE 2.68*  CALCIUM 8.2*  AST 19  ALT 8*  ALKPHOS 52  BILITOT 0.5   ------------------------------------------------------------------------------------------------------------------  Cardiac Enzymes  Recent Labs Lab 06/20/2015 0940  TROPONINI 0.08*   ------------------------------------------------------------------------------------------------------------------  RADIOLOGY:  Dg Chest Port 1 View  06/27/2015  CLINICAL DATA:  Sepsis.  Code sepsis. EXAM: PORTABLE CHEST 1 VIEW COMPARISON:  05/29/2015 FINDINGS: PICC line tip in the lower SVC region. Patchy densities in the lower chest bilaterally. Heart size is enlarged. Negative  for pneumothorax. IMPRESSION: Slightly increased densities in the lower chest bilaterally. Findings probably represent a combination of atelectasis and pleural fluid. Electronically Signed   By: Markus Daft M.D.   On: 06/26/2015 10:15   Dg Foot Complete Right  06/11/2015  CLINICAL DATA:  Right foot amputation EXAM: RIGHT FOOT COMPLETE - 3+ VIEW COMPARISON:  01/29/2015 FINDINGS: Midfoot amputation at the Lisfranc joint. Small bone fragments at the osteotomy /disarticulation may be postoperative. No gross bony erosion. There is extensive soft tissue gas in the overlying soft tissue flap, which likely reaches the cuboid. Negative for acute fracture. 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 When allowing for postoperative bone fragments there is no convincing osseous erosion. Electronically Signed   By: Monte Fantasia M.D.   On: 06/10/2015 10:21     IMPRESSION AND PLAN:   70 year old female with past medical history of hypertension, type 2 diabetes without  complication, peripheral vascular disease, anxiety, DJD, history of osteomyelitis, history of carotid artery disease, chronic kidney disease stage III who presented to the hospital due to altered mental status and noted to be septic.  #1 altered mental status-this is metabolic encephalopathy secondary to the underlying infection/sepsis.  -Continue treatment for underlying sepsis and follow mental status.  #2 sepsis-source is the right foot. Patient had a recent right foot osteomyelitis now comes in with a wound dehiscence and drainage at the same site. -I will start the patient on broad-spectrum IV antibiotics with vancomycin, Zosyn. -Get a podiatry, infectious disease and also vascular surgery consult. Next-continue IV fluids, antipyretics. Follow blood, wound cultures and follow clinically.  #3 right foot osteomyelitis-continue broad-spectrum IV antibiotics with vancomycin, Zosyn. -Consult podiatry, infectious disease. Blood, wound cultures.  #4 anemia-anemia of chronic disease secondary to her CTD stage III. -Hemoglobin is down to 6.2. Patient will be transfused 1 unit and will follow hemoglobin. -Continue iron supplements.  #5 chronic kidney disease stage III-patient's creatinine is close to baseline. We will follow her BUN and creatinine and renal dose meds  #6 hypertension-we'll hold all antihypertensives given her sepsis and hypotension.  #7 hyperlipidemia-continue simvastatin.  #8 depression-continue Remeron.  #9 GERD-continue Protonix  #10 leukopenia-secondary to sepsis. We will follow white cell count.   All the records are reviewed and case discussed with ED provider. Management plans discussed with the patient, family and they are in agreement.  CODE STATUS: Full  TOTAL TIME TAKING CARE OF THIS PATIENT: 50 minutes.    Henreitta Leber M.D on 06/08/2015 at 12:20 PM  Between 7am to 6pm - Pager - [phone]  After 6pm go to [email or web address] - password [email or web address] [place] [facility]  Office  [phone]  CC: Primary care physician; [facility]

## 2015-06-27 NOTE — ED Notes (Signed)
Assisted in getting the patient cleaned up from a very large bowel movement and in changing the patients sheets.

## 2015-06-27 NOTE — ED Notes (Signed)
Pt noted to have mild breakdown on her sacral area while patient was being cleaned up. Pt is incontinent of stool at this time. No change in patient condition at this time.

## 2015-06-27 NOTE — Consult Note (Signed)
ORTHOPAEDIC CONSULTATION  REQUESTING PHYSICIAN: Henreitta Leber, MD  Chief Complaint: Sepsis  HPI: [name] is a 70 y.o. female seen in ICU. Admitted today syptoms consistent with sepsis.  Hx of Trans-met amputation for osteomyelitis and gangrene.  Concern for foot as source of infection.  Past Medical History  Diagnosis Date  . Hypertension   . Diabetes mellitus without complication ([place])   . Anemia   . Coronary artery disease   . CHF (congestive heart failure) ([place])   . Glaucoma   . Hyperlipidemia   . Neuropathy ([place])   . Chronic anemia   . Stroke ([place])   . Anginal pain ([place])   . Arthritis   . Retinopathy   . Cardiomyopathy ([place])   . Carotid artery stenosis   . Hoarseness, chronic   . GERD (gastroesophageal reflux disease)   . Hemangioma of liver   . Weight loss, unintentional   . DDD (degenerative disc disease), lumbar   . Osteomyelitis of foot, right, acute ([place])   . Chronic kidney disease     Stage 3   Past Surgical History  Procedure Laterality Date  . Cardiac catheterization    . Esophagogastroduodenoscopy (egd) with propofol N/A 09/24/2014    Elliott-gastritis & normal Billroth II changes   . Colonoscopy with propofol N/A 09/24/2014    Elliott-Incomplete secondary to prep  . Coronary angioplasty  2016    stent placed  . Bilroth ii procedure    . Colonoscopy with propofol N/A 11/12/2014    Procedure: COLONOSCOPY WITH PROPOFOL;  Surgeon: Manya Silvas, MD;  Location: [facility] ENDOSCOPY;  Service: Endoscopy;  Laterality: N/A;  Trudee Kuster hole Right 12/06/2014    Procedure: Right burr hole  for subdural hematoma;  Surgeon: Newman Pies, MD;  Location: [facility] NEURO ORS;  Service: Neurosurgery;  Laterality: Right;  . Irrigation and debridement foot Right 02/01/2015    Procedure: IRRIGATION AND DEBRIDEMENT FOOT/RIGHT GREAT TOE;  Surgeon: [name], DPM;  Location: [facility] ORS;  Service: Podiatry;  Laterality: Right;  . Eye surgery Right     cataract removed  .  Amputation toe Right 03/01/2015    Procedure: AMPUTATION  RIGHT GREAT TOE;  Surgeon: [name], DPM;  Location: [facility] ORS;  Service: Podiatry;  Laterality: Right;  . Irrigation and debridement foot Right 04/03/2015    Procedure: IRRIGATION AND DEBRIDEMENT FOOT;  Surgeon: Sharlotte Alamo, MD;  Location: [facility] ORS;  Service: Podiatry;  Laterality: Right;  . Peripheral vascular catheterization N/A 04/04/2015    Procedure: Abdominal Aortogram w/Lower Extremity;  Surgeon: Algernon Huxley, MD;  Location: [place] CV LAB;  Service: Cardiovascular;  Laterality: N/A;  . Peripheral vascular catheterization  04/04/2015    Procedure: Lower Extremity Intervention;  Surgeon: Algernon Huxley, MD;  Location: [place] CV LAB;  Service: Cardiovascular;;  . Brain surgery    . Transmetatarsal amputation Right 05/28/2015    Procedure: TRANSMETATARSAL AMPUTATION;  Surgeon: Sharlotte Alamo, DPM;  Location: [facility] ORS;  Service: Podiatry;  Laterality: Right;  . Peripheral vascular catheterization N/A 06/06/2015    Procedure: Abdominal Aortogram w/Lower Extremity;  Surgeon: Algernon Huxley, MD;  Location: [place] CV LAB;  Service: Cardiovascular;  Laterality: N/A;  . Peripheral vascular catheterization  06/06/2015    Procedure: Lower Extremity Intervention;  Surgeon: Algernon Huxley, MD;  Location: [place] CV LAB;  Service: Cardiovascular;;  . Transmetatarsal amputation Right 06/07/2015    Procedure: TRANSMETATARSAL AMPUTATION/ REVISION;  Surgeon: [name], DPM;  Location: [facility] ORS;  Service: Podiatry;  Laterality: Right;  . Application of wound vac Right 06/07/2015    Procedure: APPLICATION OF WOUND VAC;  Surgeon: [name], DPM;  Location: [facility] ORS;  Service: Podiatry;  Laterality: Right;   Social History   Social History  . Marital Status: Married    Spouse Name: N/A  . Number of Children: 2  . Years of Education: N/A   Occupational History  . retired Actuary     Social History Main Topics  . Smoking  status: Never Smoker   . Smokeless tobacco: Never Used     Comment: quit many years ago  . Alcohol Use: No  . Drug Use: No  . Sexual Activity: Yes    Birth Control/ Protection: Post-menopausal   Other Topics Concern  . None   Social History Narrative   Lives with her husband   Family History  Problem Relation Age of Onset  . Diabetes Mother   . Heart disease Father   . Diabetes Sister   . Lung cancer Brother   . Cancer Brother   . Diabetes Brother   . Diabetes Sister    Allergies  Allergen Reactions  . Codeine Other (See Comments)    Reaction:  Hallucinations   Prior to Admission medications   Medication Sig Start Date End Date Taking? Authorizing Provider  acetaminophen (TYLENOL) 325 MG tablet Take 2 tablets (650 mg total) by mouth every 4 (four) hours as needed for mild pain (or temp > 99 F). 12/13/14  Yes Delfina Redwood, MD  Calcium Carbonate-Vitamin D (CALCIUM 600+D) 600-400 MG-UNIT tablet Take 1 tablet by mouth 2 (two) times daily.   Yes Historical Provider, MD  cloNIDine (CATAPRES) 0.2 MG tablet Take 1 tablet (0.2 mg total) by mouth 3 (three) times daily. 04/08/15  Yes Demetrios Loll, MD  docusate sodium (COLACE) 100 MG capsule Take 1 capsule (100 mg total) by mouth 2 (two) times daily. 02/05/15  Yes Lucilla Lame, MD  ferrous sulfate 325 (65 FE) MG tablet Take 325 mg by mouth daily. Reported on 06/04/2015   Yes Historical Provider, MD  folic acid (FOLVITE) 1 MG tablet Take 1 tablet (1 mg total) by mouth daily. 02/22/15  Yes Lequita Asal, MD  hydrALAZINE (APRESOLINE) 100 MG tablet Take 100 mg by mouth 3 (three) times daily.    Yes Historical Provider, MD  insulin aspart (NOVOLOG) 100 UNIT/ML injection Inject 0-9 Units into the skin 3 (three) times daily with meals. Patient taking differently: Inject 0-9 Units into the skin 2 (two) times daily. On Monday, Wednesday, Friday, Saturday 05/30/15  Yes Fritzi Mandes, MD  isosorbide dinitrate (ISORDIL) 30 MG tablet Take 30 mg by  mouth 2 (two) times daily. Reported on 05/27/2015   Yes Historical Provider, MD  labetalol (NORMODYNE) 100 MG tablet Take 100 mg by mouth 2 (two) times daily. Reported on 05/27/2015   Yes Historical Provider, MD  mirtazapine (REMERON) 15 MG tablet Take 15 mg by mouth at bedtime. Reported on 05/27/2015   Yes Historical Provider, MD  nitroGLYCERIN (NITROSTAT) 0.4 MG SL tablet Place 0.4 mg under the tongue every 5 (five) minutes as needed for chest pain.   Yes Historical Provider, MD  OMEGA-3 FATTY ACIDS PO Take 1 capsule by mouth daily. Reported on 05/27/2015   Yes Historical Provider, MD  omeprazole (PRILOSEC) 20 MG capsule Take 20 mg by mouth daily.   Yes Historical Provider, MD  oxyCODONE-acetaminophen (ROXICET) 5-325 MG tablet Take 1-2 tablets by mouth every 4 (  four) hours as needed for severe pain. 06/10/15  Yes Sital Mody, MD  piperacillin-tazobactam (ZOSYN) 3.375 (3-0.375) g injection Inject 3.375 g into the muscle every 8 (eight) hours.   Yes Historical Provider, MD  potassium chloride SA (K-DUR,KLOR-CON) 20 MEQ tablet Take 1 tablet (20 mEq total) by mouth daily. 11/02/14  Yes Alisa Graff, FNP  simvastatin (ZOCOR) 20 MG tablet Take 20 mg by mouth at bedtime. Reported on 05/27/2015   Yes Historical Provider, MD  sulfamethoxazole-trimethoprim (BACTRIM) 400-80 MG tablet Take 1 tablet by mouth 2 (two) times daily. 06/10/15 07/02/15 Yes Sital Mody, MD  torsemide (DEMADEX) 20 MG tablet Take 20 mg by mouth daily.   Yes Historical Provider, MD  VANCOMYCIN HCL IV Inject 500 mg into the vein daily. 06/26/15  Yes Historical Provider, MD   Dg Chest Port 1 View  06/23/2015  CLINICAL DATA:  Sepsis.  Code sepsis. EXAM: PORTABLE CHEST 1 VIEW COMPARISON:  05/29/2015 FINDINGS: PICC line tip in the lower SVC region. Patchy densities in the lower chest bilaterally. Heart size is enlarged. Negative for pneumothorax. IMPRESSION: Slightly increased densities in the lower chest bilaterally. Findings probably represent a  combination of atelectasis and pleural fluid. Electronically Signed   By: Markus Daft M.D.   On: 06/06/2015 10:15   Dg Foot Complete Right  06/09/2015  CLINICAL DATA:  Right foot amputation EXAM: RIGHT FOOT COMPLETE - 3+ VIEW COMPARISON:  01/29/2015 FINDINGS: Midfoot amputation at the Lisfranc joint. Small bone fragments at the osteotomy /disarticulation may be postoperative. No gross bony erosion. There is extensive soft tissue gas in the overlying soft tissue flap, which likely reaches the cuboid. Negative for acute fracture. 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 When allowing for postoperative bone fragments there is no convincing osseous erosion. Electronically Signed   By: Monte Fantasia M.D.   On: 06/07/2015 10:21    Positive ROS: All other systems have been reviewed and were otherwise negative with the exception of those mentioned in the HPI and as above.  12 point ROS was performed.  Physical Exam: General: Alert and oriented.  No apparent distress.  Vascular:  Left foot:Dorsalis Pedis:  diminished Posterior Tibial:  diminished  Right foot: Dorsalis Pedis:  absent Posterior Tibial:  absent  Neuro:absent  Derm:Necrosis to lateral skin flap with dehiscence.  No severe purulence.  No foul odor  Ortho/MS: s/p right trans-met ampuation   Assessment: Sepsis with wound dehiscence and necrosis right trans-met amputation site  Plan: Pt intubated currently.  Foot may be source of infection but does not have severe purulence or erythema.  May need to look for other site of infection.  Does pt have sacral ulcer?  Also with C-Diff.  Would not recommend further foot surgery as recently undergone multiple attempts at limb salvage and would recommend BKA if foot felt to be site of sepsis.  Vascular surgery has been consulted and will defer further treatment to their service.    Elesa Hacker, DPM Cell [phone]   06/16/2015 5:59 PM

## 2015-06-27 NOTE — ED Notes (Addendum)
Dr. Sainani at bedside at this time.  

## 2015-06-27 NOTE — Consult Note (Signed)
Pharmacy Antibiotic Note  [name] is a 70 y.o. female admitted on 06/25/2015 with sepsis.  Pharmacy has been consulted for zosyn and vancomycin dosing. Patient was discharged about 3 weeks ago after being tx for osteomyelitis and undergoing transmetatarsal amputation. Patient was discharged on bactrim and IV zosyn. IV vancomycin was added yesterday at 500mg  daily at 1600. Patient is now septic. Patient was receiving zosyn at 0600, 1400 and 2200. On last admission patient was on vancomycin 1000mg  q 36 hours with a trough of 18. Renal function is about the same as pervious admission.  Plan: Vancomycin 1000 IV every 36 hours.  Goal trough 15-20 mcg/mL. Zosyn 3.375g IV q8h (4 hour infusion).  Height: 5\' 7"  (170.2 cm) Weight: 185 lb (83.915 kg) IBW/kg (Calculated) : 61.6  Temp (24hrs), Avg:98.7 F (37.1 C), Min:97.4 F (36.3 C), Max:100.8 F (38.2 C)   Recent Labs Lab 06/17/2015 0940 06/24/2015 0941 06/23/2015 1258  WBC 1.5*  --   --   CREATININE 2.68*  --   --   LATICACIDVEN  --  1.5 1.2    Estimated Creatinine Clearance: 21.7 mL/min (by C-G formula based on Cr of 2.68).    Allergies  Allergen Reactions  . Codeine Other (See Comments)    Reaction:  Hallucinations    Antimicrobials this admission: zosyn 2/23 >>  vancomycin 2/23 >>   Dose adjustments this admission: Vancomycin changed from 500mg  q 24hr to 1000mg  q 36 hours  Microbiology results: 2/23 BCx: in process 2/23 wound: in process  Thank you for allowing pharmacy to be a part of this patient's care.  Ramond Dial 06/08/2015 3:03 PM

## 2015-06-27 NOTE — ED Notes (Signed)
NO change in patient condition. Pt receiving blood products at this time. This RN sitting with patient at this time.

## 2015-06-27 NOTE — ED Notes (Signed)
No change in patient condition noted at this time. Pt's family at bedside with patient. Pt tolerating blood transfusion at a rate of 122mL/hr.

## 2015-06-27 NOTE — Progress Notes (Signed)
[facility] Progress Note Patient Name: [name] DOB: 05/31/1945 MRN: [id number]   Date of Service  06/13/2015  HPI/Events of Note  Pt admitted with sepsis ? From osteomyelitis?  On vanc/zosyn      eICU Interventions  Agree with rx, will monitor     Intervention Category Major Interventions: Sepsis - evaluation and management  Christinia Gully 06/09/2015, 4:22 PM

## 2015-06-27 NOTE — ED Provider Notes (Addendum)
[facility] Emergency Department Provider Note   ____________________________________________  Time seen: Approximately 9:15 AM I have reviewed the triage vital signs and the triage nursing note.  HISTORY  Chief Complaint Code Sepsis   Historian Limited, patient poor historian with acute illness/sepsis History per EMS/nursing home report  HPI [name] is a 70 y.o. 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 She is febrile today with some increased confusion was sent for evaluation. Her foot is draining foul odor and part of the flap has died. Symptoms are moderate to severe. No reported hypotension.  Patient denies pain or trouble breathing although her oxygen level is low.    Past Medical History  Diagnosis Date  . Hypertension   . Diabetes mellitus without complication ([place])   . Anemia   . Coronary artery disease   . CHF (congestive heart failure) ([place])   . Glaucoma   . Hyperlipidemia   . Neuropathy ([place])   . Chronic anemia   . Stroke ([place])   . Anginal pain ([place])   . Arthritis   . Retinopathy   . Cardiomyopathy ([place])   . Carotid artery stenosis   . Hoarseness, chronic   . GERD (gastroesophageal reflux disease)   . Hemangioma of liver   . Weight loss, unintentional   . DDD (degenerative disc disease), lumbar   . Osteomyelitis of foot, right, acute ([place])   . Chronic kidney disease     Stage 3    Patient Active Problem List   Diagnosis Date Noted  . Sepsis ([place]) 06/08/2015  . Sacral pressure ulcer 06/05/2015  . Gangrene of foot ([place]) 06/04/2015  . Osteomyelitis ([place]) 05/27/2015  . 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 Osteomyelitis of ankle or foot ([place]) 04/02/2015  . Weight loss 02/21/2015  . Folate deficiency 02/21/2015  . Cellulitis of great toe of right foot 02/02/2015  . Diabetes mellitus ([place]) 02/02/2015  .  CKD (chronic kidney disease), stage III 02/02/2015  . Anemia of chronic disease 02/02/2015  . Anemia 01/30/2015  . Infected wound ([place]) 01/29/2015  . Subdural hematoma ([place]) 12/06/2014  . Pressure ulcer 12/05/2014  . SDH (subdural hematoma) ([place]) 12/03/2014  . Chronic diastolic heart failure ([place]) 11/02/2014  . Melena 10/02/2014  . DM (diabetes mellitus) ([place]) 02/04/2010  . Deficiency anemia 02/04/2010  . Essential hypertension 02/04/2010  . Coronary atherosclerosis 02/04/2010    Past Surgical History  Procedure Laterality Date  . Cardiac catheterization    . Esophagogastroduodenoscopy (egd) with propofol N/A 09/24/2014    Elliott-gastritis & normal Billroth II changes   . Colonoscopy with propofol N/A 09/24/2014    Elliott-Incomplete secondary to prep  . Coronary angioplasty  2016    stent placed  . Bilroth ii procedure    . Colonoscopy with propofol N/A 11/12/2014    Procedure: COLONOSCOPY WITH PROPOFOL;  Surgeon: Manya Silvas, MD;  Location: [facility] ENDOSCOPY;  Service: Endoscopy;  Laterality: N/A;  Trudee Kuster hole Right 12/06/2014    Procedure: Right burr hole  for subdural hematoma;  Surgeon: Newman Pies, MD;  Location: [facility] NEURO ORS;  Service: Neurosurgery;  Laterality: Right;  . Irrigation and debridement foot Right 02/01/2015    Procedure: IRRIGATION AND DEBRIDEMENT FOOT/RIGHT GREAT TOE;  Surgeon: [name], DPM;  Location: [facility] ORS;  Service: Podiatry;  Laterality: Right;  . Eye surgery Right     cataract removed  . Amputation toe  Right 03/01/2015    Procedure: AMPUTATION  RIGHT GREAT TOE;  Surgeon: [name], DPM;  Location: [facility] ORS;  Service: Podiatry;  Laterality: Right;  . Irrigation and debridement foot Right 04/03/2015    Procedure: IRRIGATION AND DEBRIDEMENT FOOT;  Surgeon: Sharlotte Alamo, MD;  Location: [facility] ORS;  Service: Podiatry;  Laterality: Right;  . Peripheral vascular catheterization N/A 04/04/2015    Procedure: Abdominal Aortogram w/Lower Extremity;   Surgeon: Algernon Huxley, MD;  Location: [place] CV LAB;  Service: Cardiovascular;  Laterality: N/A;  . Peripheral vascular catheterization  04/04/2015    Procedure: Lower Extremity Intervention;  Surgeon: Algernon Huxley, MD;  Location: [place] CV LAB;  Service: Cardiovascular;;  . Brain surgery    . Transmetatarsal amputation Right 05/28/2015    Procedure: TRANSMETATARSAL AMPUTATION;  Surgeon: Sharlotte Alamo, DPM;  Location: [facility] ORS;  Service: Podiatry;  Laterality: Right;  . Peripheral vascular catheterization N/A 06/06/2015    Procedure: Abdominal Aortogram w/Lower Extremity;  Surgeon: Algernon Huxley, MD;  Location: [place] CV LAB;  Service: Cardiovascular;  Laterality: N/A;  . Peripheral vascular catheterization  06/06/2015    Procedure: Lower Extremity Intervention;  Surgeon: Algernon Huxley, MD;  Location: [place] CV LAB;  Service: Cardiovascular;;  . Transmetatarsal amputation Right 06/07/2015    Procedure: TRANSMETATARSAL AMPUTATION/ REVISION;  Surgeon: [name], DPM;  Location: [facility] ORS;  Service: Podiatry;  Laterality: Right;  . Application of wound vac Right 06/07/2015    Procedure: APPLICATION OF WOUND VAC;  Surgeon: [name], DPM;  Location: [facility] ORS;  Service: Podiatry;  Laterality: Right;    Current Outpatient Rx  Name  Route  Sig  Dispense  Refill  . acetaminophen (TYLENOL) 325 MG tablet   Oral   Take 2 tablets (650 mg total) by mouth every 4 (four) hours as needed for mild pain (or temp > 99 F).         . Calcium Carbonate-Vitamin D (CALCIUM 600+D) 600-400 MG-UNIT tablet   Oral   Take 1 tablet by mouth 2 (two) times daily.         . cloNIDine (CATAPRES) 0.2 MG tablet   Oral   Take 1 tablet (0.2 mg total) by mouth 3 (three) times daily.   90 tablet   0   . docusate sodium (COLACE) 100 MG capsule   Oral   Take 1 capsule (100 mg total) by mouth 2 (two) times daily.   60 capsule   11   . ferrous sulfate 325 (65 FE) MG tablet   Oral   Take 325 mg by  mouth daily. Reported on [id number]         . folic acid (FOLVITE) 1 MG tablet   Oral   Take 1 tablet (1 mg total) by mouth daily.   60 tablet   1   . hydrALAZINE (APRESOLINE) 100 MG tablet   Oral   Take 100 mg by mouth 3 (three) times daily.          . insulin aspart (NOVOLOG) 100 UNIT/ML injection   Subcutaneous   Inject 0-9 Units into the skin 3 (three) times daily with meals. Patient taking differently: Inject 0-9 Units into the skin 2 (two) times daily. On Monday, Wednesday, Friday, Saturday   10 mL   11   . isosorbide dinitrate (ISORDIL) 30 MG tablet   Oral   Take 30 mg by mouth 2 (two) times daily. Reported on 05/27/2015         .  labetalol (NORMODYNE) 100 MG tablet   Oral   Take 100 mg by mouth 2 (two) times daily. Reported on 05/27/2015         . mirtazapine (REMERON) 15 MG tablet   Oral   Take 15 mg by mouth at bedtime. Reported on 05/27/2015         . nitroGLYCERIN (NITROSTAT) 0.4 MG SL tablet   Sublingual   Place 0.4 mg under the tongue every 5 (five) minutes as needed for chest pain.         Marland Kitchen OMEGA-3 FATTY ACIDS PO   Oral   Take 1 capsule by mouth daily. Reported on 05/27/2015         . omeprazole (PRILOSEC) 20 MG capsule   Oral   Take 20 mg by mouth daily.         Marland Kitchen oxyCODONE-acetaminophen (ROXICET) 5-325 MG tablet   Oral   Take 1-2 tablets by mouth every 4 (four) hours as needed for severe pain.   30 tablet   0   . potassium chloride SA (K-DUR,KLOR-CON) 20 MEQ tablet   Oral   Take 1 tablet (20 mEq total) by mouth daily.   30 tablet   5   . simvastatin (ZOCOR) 20 MG tablet   Oral   Take 20 mg by mouth at bedtime. Reported on 05/27/2015         . sulfamethoxazole-trimethoprim (BACTRIM) 400-80 MG tablet   Oral   Take 1 tablet by mouth 2 (two) times daily.   60 tablet   0   . torsemide (DEMADEX) 20 MG tablet   Oral   Take 20 mg by mouth daily.           Allergies Codeine  Family History  Problem Relation Age of Onset   . Diabetes Mother   . Heart disease Father   . Diabetes Sister   . Lung cancer Brother   . Cancer Brother   . Diabetes Brother   . Diabetes Sister     Social History Social History  Substance Use Topics  . Smoking status: Never Smoker   . Smokeless tobacco: Never Used     Comment: quit many years ago  . Alcohol Use: No    Review of Systems  Constitutional: Positive for fever. Eyes: Negative for visual changes. ENT: Negative for sore throat. Cardiovascular: Negative for chest pain. Respiratory: Negative for shortness of breath. Gastrointestinal: Negative for abdominal pain, vomiting and diarrhea. Genitourinary: Negative for dysuria. Musculoskeletal: Negative for back pain. Skin: Negative for rash. Neurological: Negative for headache. 10 point Review of Systems otherwise negative ____________________________________________   PHYSICAL EXAM:  VITAL SIGNS: ED Triage Vitals  Enc Vitals Group     BP 06/30/2015 0912 123/47 mmHg     Pulse Rate 07/02/2015 0912 97     Resp 06/05/2015 0912 26     Temp 06/16/2015 0912 100.8 F (38.2 C)     Temp Source 06/14/2015 0912 Oral     SpO2 06/24/2015 0912 91 %     Weight 06/26/2015 0912 185 lb (83.915 kg)     Height 06/27/15 0912 5\' 7"  (1.702 m)     Head Cir --      [place] --      Pain Score --      Pain Loc --      Pain Edu? --      Excl. in [place]? --      Constitutional: Alert, no acute distress. HEENT  Head: Normocephalic and atraumatic.      Eyes: Conjunctivae are normal. PERRL. Normal extraocular movements.      Ears:         Nose: No congestion/rhinnorhea.   Mouth/Throat: Mucous membranes are mildly dry.   Neck: No stridor. Cardiovascular/Chest: Normal rate, regular rhythm.  No murmurs, rubs, or gallops. Respiratory: Normal respiratory effort without tachypnea nor retractions. Breath sounds are clear and equal bilaterally. Mild rhonchi posteriorly, no wheezing. Gastrointestinal: Soft. No distention, no guarding, no  rebound. Nontender.    Genitourinary/rectal:Deferred Musculoskeletal: Right foot swollen with midfoot amputation. Part of the flap is black and foul smelling yellow drainage from wound. Several areas where the stitches are still intact. Neurologic:  Alert, poor historian but no facial droop or gross or focal neurologic deficits are appreciated. Skin:  Skin is warm, dry. Right Foot as above.   ____________________________________________   EKG I, Lisa Roca, MD, the attending physician have personally viewed and interpreted all ECGs.  97 bpm. Normal sinus rhythm. Narrow QRS. No axis. Nonspecific T-wave ____________________________________________  LABS (pertinent positives/negatives)  Lactate 1.5 Metabolic panel significant for CO2 16, BUN 62, creatinine 2.6895 and white blood count 1.5, hemoglobin 6.2, platelet count 101 ANC 1000 Lipase 16 Troponin 0.08 and BNP 2873  ____________________________________________  RADIOLOGY All Xrays were viewed by me. Imaging interpreted by Radiologist.  Chest portable:  IMPRESSION: Slightly increased densities in the lower chest bilaterally. Findings probably represent a combination of atelectasis and pleural Fluid.  Foot right complaint:  IMPRESSION: Lisfranc amputation with prominent amount of soft tissue gas, reaching bone at the lateral midfoot. When allowing for postoperative bone fragments there is no convincing osseous erosion. __________________________________________  PROCEDURES  Procedure(s) performed: None  Critical Care performed: CRITICAL CARE Performed by: Lisa Roca   Total critical care time: 30 minutes  Critical care time was exclusive of separately billable procedures and treating other patients.  Critical care was necessary to treat or prevent imminent or life-threatening deterioration.  Critical care was time spent personally by me on the following activities: development of treatment plan with patient  and/or surrogate as well as nursing, discussions with consultants, evaluation of patient's response to treatment, examination of patient, obtaining history from patient or surrogate, ordering and performing treatments and interventions, ordering and review of laboratory studies, ordering and review of radiographic studies, pulse oximetry and re-evaluation of patient's condition.   ____________________________________________   ED COURSE / ASSESSMENT AND PLAN  Pertinent labs & imaging results that were available during my care of the patient were reviewed by me and considered in my medical decision making (see chart for details).    Patient arrived with tachycardia and febrile with a draining right foot wound status post foot amputation, concerning for sepsis. Code sepsis initiated.  Patient also reported to have some confusion to her baseline. She is unable to provide a good history but is awake and follows commands.  No focal neurologic deficits.  Right foot has areas that are necrotic and draining foul-smelling pus.  Patient initially with a good blood pressure, that dropped her blood systolic blood pressure into the 90s. Fluid bolus was initiated. 30 cc/kg bolus was not initiated by protocol due to history of CHF, but fluid hydration was given according to maintain her blood pressure above 100.  Found to be anemic to 6.2, and in the setting of sepsis, patient will be transfused 2 units packed red blood cells.  On laboratory evaluation she was found to be slightly neutropenic with an [place]  1000. She is already on vancomycin and Zosyn via PICC line since 2/20, and I did discuss this with Dr. Ola Spurr with infectious disease who recommends just continuing on vancomycin and Zosyn.  I did consult with Dr. Vickki Muff, podiatry, who will be consulted in the hospital for evaluation and likely surgical washout versus recognition for amputation.    CONSULTATIONS:   Phone consultation with  infectious disease, podiatry, and face-to-face with hospitalist for admission.   Patient / Family / Caregiver informed of clinical course, medical decision-making process, and agree with plan.    ___________________________________________   FINAL CLINICAL IMPRESSION(S) / ED DIAGNOSES   Final diagnoses:  Subacute osteomyelitis of right foot ([place])  Wound infection ([place])  Sepsis, due to unspecified organism ([place])  Anemia, unspecified              Note: This dictation was prepared with Dragon dictation. Any transcriptional errors that result from this process are unintentional   Lisa Roca, MD 06/17/2015 [place], MD 06/23/2015 1225

## 2015-06-27 NOTE — ED Notes (Signed)
Pt presents to ED via [facility] for Code Sepsis from [facility]. Per EMS pt presents with a fever, tachycardia, and altered mental status after having the toes of her R foot amputated. Pt presents unaware of where she is at this time, but will respond to verbal stimuli.

## 2015-06-27 NOTE — Progress Notes (Signed)
Patient intubated with 7.5 ETT by Fritz Pickerel, CP.  Dr. Tor Netters present for intubation.  Equal bilateral breath sounds, positive color change noted on c02 detector, condensation noted in ETT.  ETT secured by Angie, CP and Larry, CP with commercial tube holder.

## 2015-06-27 NOTE — ED Notes (Signed)
This RN and Vicente Males, RN received verbal consent for blood transfusion over the phone. This RN and Vicente Males, RN signed consent form. Pt unable to sign due to altered mental status and confusion.

## 2015-06-27 NOTE — Progress Notes (Signed)
Spoke with Dr Verdell Carmine patient is having labored breathing. ABG ordered- E-LINK called as well.

## 2015-06-27 NOTE — Progress Notes (Signed)
eLink Physician-Brief Progress Note Patient Name: [name] DOB: 10-Aug-1945 MRN: [id number]   Date of Service  06/21/2015  HPI/Events of Note  Shock already on levophen    Lab Results  Component Value Date   HGB 6.3* 06/14/2015   HGB 5.7* 06/07/2015   HGB 6.2* 06/14/2015   HGB 10.9* 05/05/2014   HGB 9.8* 05/21/2013   HGB 9.9* 05/20/2013       eICU Interventions  Add pitressin/ transfuse x 2 units prbcs     Intervention Category Major Interventions: Shock - evaluation and management  Christinia Gully 06/14/2015, 9:47 PM

## 2015-06-27 NOTE — Progress Notes (Signed)
Spoke with Dr. Verdell Carmine regarding patients blood sugar, mentation and blood pressure. Orders entered

## 2015-06-28 ENCOUNTER — Inpatient Hospital Stay: Payer: Commercial Managed Care - HMO

## 2015-06-28 LAB — CBC WITH DIFFERENTIAL/PLATELET
BAND NEUTROPHILS: 2 %
BASOS ABS: 0 10*3/uL (ref 0–0.1)
Basophils Relative: 0 %
Blasts: 0 %
EOS ABS: 0 10*3/uL (ref 0–0.7)
EOS PCT: 2 %
HEMATOCRIT: 26.7 % — AB (ref 35.0–47.0)
Hemoglobin: 8.1 g/dL — ABNORMAL LOW (ref 12.0–16.0)
LYMPHS ABS: 0.9 10*3/uL — AB (ref 1.0–3.6)
Lymphocytes Relative: 90 %
MCH: 27.2 pg (ref 26.0–34.0)
MCHC: 30.4 g/dL — ABNORMAL LOW (ref 32.0–36.0)
MCV: 89.4 fL (ref 80.0–100.0)
METAMYELOCYTES PCT: 0 %
MONO ABS: 0 10*3/uL — AB (ref 0.2–0.9)
MYELOCYTES: 0 %
Monocytes Relative: 2 %
Neutro Abs: 0.1 10*3/uL — ABNORMAL LOW (ref 1.4–6.5)
Neutrophils Relative %: 4 %
Other: 0 %
PLATELETS: 34 10*3/uL — AB (ref 150–440)
Promyelocytes Absolute: 0 %
RBC: 2.98 MIL/uL — AB (ref 3.80–5.20)
RDW: 19.6 % — AB (ref 11.5–14.5)
WBC: 1 10*3/uL — AB (ref 3.6–11.0)
nRBC: 9 /100 WBC — ABNORMAL HIGH

## 2015-06-28 LAB — COMPREHENSIVE METABOLIC PANEL
ALBUMIN: 1.1 g/dL — AB (ref 3.5–5.0)
ALK PHOS: 43 U/L (ref 38–126)
ALT: 8 U/L — AB (ref 14–54)
AST: 29 U/L (ref 15–41)
Anion gap: 10 (ref 5–15)
BILIRUBIN TOTAL: 0.5 mg/dL (ref 0.3–1.2)
BUN: 53 mg/dL — AB (ref 6–20)
CALCIUM: 6.9 mg/dL — AB (ref 8.9–10.3)
CO2: 10 mmol/L — ABNORMAL LOW (ref 22–32)
CREATININE: 2.51 mg/dL — AB (ref 0.44–1.00)
Chloride: 118 mmol/L — ABNORMAL HIGH (ref 101–111)
GFR calc Af Amer: 21 mL/min — ABNORMAL LOW (ref >60)
GFR calc non Af Amer: 18 mL/min — ABNORMAL LOW (ref >60)
GLUCOSE: 223 mg/dL — AB (ref 65–99)
Potassium: 4 mmol/L (ref 3.5–5.1)
Sodium: 138 mmol/L (ref 135–145)
TOTAL PROTEIN: 3.8 g/dL — AB (ref 6.5–8.1)

## 2015-06-28 LAB — GLUCOSE, CAPILLARY
GLUCOSE-CAPILLARY: 79 mg/dL (ref 65–99)
GLUCOSE-CAPILLARY: 84 mg/dL (ref 65–99)
GLUCOSE-CAPILLARY: 155 mg/dL — AB (ref 65–99)
GLUCOSE-CAPILLARY: 40 mg/dL — AB (ref 65–99)
GLUCOSE-CAPILLARY: 96 mg/dL (ref 65–99)
GLUCOSE-CAPILLARY: 148 mg/dL — AB (ref 65–99)
GLUCOSE-CAPILLARY: 139 mg/dL — AB (ref 65–99)
GLUCOSE-CAPILLARY: 18 mg/dL — AB (ref 65–99)
Glucose-Capillary: 103 mg/dL — ABNORMAL HIGH (ref 65–99)
Glucose-Capillary: 142 mg/dL — ABNORMAL HIGH (ref 65–99)
Glucose-Capillary: 167 mg/dL — ABNORMAL HIGH (ref 65–99)
Glucose-Capillary: 19 mg/dL — CL (ref 65–99)
Glucose-Capillary: 39 mg/dL — CL (ref 65–99)
Glucose-Capillary: 47 mg/dL — ABNORMAL LOW (ref 65–99)
Glucose-Capillary: 53 mg/dL — ABNORMAL LOW (ref 65–99)
Glucose-Capillary: 54 mg/dL — ABNORMAL LOW (ref 65–99)
Glucose-Capillary: <10 mg/dL — CL (ref 65–99)
Glucose-Capillary: <10 mg/dL — CL (ref 65–99)
Glucose-Capillary: <10 mg/dL — CL (ref 65–99)

## 2015-06-28 LAB — LACTIC ACID, PLASMA
LACTIC ACID, VENOUS: 4.8 mmol/L — AB (ref 0.5–2.0)
Lactic Acid, Venous: 2.8 mmol/L (ref 0.5–2.0)

## 2015-06-28 LAB — BLOOD GAS, ARTERIAL
ACID-BASE DEFICIT: 17.4 mmol/L — AB (ref 0.0–2.0)
ALLENS TEST (PASS/FAIL): POSITIVE — AB
Acid-base deficit: 20.6 mmol/L — ABNORMAL HIGH (ref 0.0–2.0)
Allens test (pass/fail): POSITIVE — AB
BICARBONATE: 10.8 meq/L — AB (ref 21.0–28.0)
Bicarbonate: 8.2 mEq/L — ABNORMAL LOW (ref 21.0–28.0)
FIO2: 0.6
FIO2: 0.6
LHR: 30 {breaths}/min
MECHVT: 450 mL
Mechanical Rate: 30
O2 SAT: 83.1 %
O2 SAT: 71.5 %
PCO2 ART: 34 mmHg (ref 32.0–48.0)
PCO2 ART: 29 mmHg — AB (ref 32.0–48.0)
PEEP/CPAP: 5 cmH2O
PEEP: 5 cmH2O
PO2 ART: 65 mmHg — AB (ref 83.0–108.0)
Patient temperature: 37
Patient temperature: 37
RATE: 35 resp/min
VT: 450 mL
pH, Arterial: 7.11 — CL (ref 7.350–7.450)
pH, Arterial: 7.06 — CL (ref 7.350–7.450)
pO2, Arterial: 55 mmHg — ABNORMAL LOW (ref 83.0–108.0)

## 2015-06-28 LAB — PREPARE RBC (CROSSMATCH)

## 2015-06-28 LAB — CBC
HCT: 26.6 % — ABNORMAL LOW (ref 35.0–47.0)
Hemoglobin: 8.4 g/dL — ABNORMAL LOW (ref 12.0–16.0)
MCH: 27.4 pg (ref 26.0–34.0)
MCHC: 31.6 g/dL — AB (ref 32.0–36.0)
MCV: 86.5 fL (ref 80.0–100.0)
PLATELETS: 63 10*3/uL — AB (ref 150–440)
RBC: 3.07 MIL/uL — ABNORMAL LOW (ref 3.80–5.20)
RDW: 18.7 % — AB (ref 11.5–14.5)
WBC: 0.6 10*3/uL — AB (ref 3.6–11.0)

## 2015-06-28 LAB — TOBRAMYCIN LEVEL, RANDOM: Tobramycin Rm: 7 ug/mL

## 2015-06-28 LAB — PROTIME-INR
INR: 3.45
PROTHROMBIN TIME: 34 s — AB (ref 11.4–15.0)

## 2015-06-28 MED ORDER — SODIUM BICARBONATE 8.4 % IV SOLN
INTRAVENOUS | Status: AC
  Filled 2015-06-28: qty 100

## 2015-06-28 MED ORDER — SODIUM BICARBONATE 8.4 % IV SOLN
INTRAVENOUS | Status: DC
  Administered 2015-06-28 (×4): via INTRAVENOUS
  Filled 2015-06-28 (×5): qty 150

## 2015-06-28 MED ORDER — SODIUM BICARBONATE 8.4 % IV SOLN
100.0000 meq | Freq: Once | INTRAVENOUS | Status: AC
  Administered 2015-06-28: 100 meq via INTRAVENOUS
  Filled 2015-06-28: qty 100

## 2015-06-28 MED ORDER — DEXTROSE 50 % IV SOLN
1.0000 | INTRAVENOUS | Status: AC
  Administered 2015-06-28: 50 mL via INTRAVENOUS

## 2015-06-28 MED ORDER — SODIUM BICARBONATE 8.4 % IV SOLN
Freq: Once | INTRAVENOUS | Status: AC
  Administered 2015-06-28 (×2): via INTRAVENOUS
  Filled 2015-06-28: qty 150

## 2015-06-28 MED ORDER — INSULIN ASPART 100 UNIT/ML ~~LOC~~ SOLN: 0.0000 [IU] | SUBCUTANEOUS | Status: DC

## 2015-06-28 MED ORDER — DEXTROSE 5 % IV SOLN
7.0000 mg/kg | INTRAVENOUS | Status: DC
  Filled 2015-06-28: qty 12.25

## 2015-06-28 MED ORDER — DEXTROSE 50 % IV SOLN
1.0000 | Freq: Once | INTRAVENOUS | Status: AC
  Administered 2015-06-28: 50 mL via INTRAVENOUS

## 2015-06-28 MED ORDER — GLUCAGON HCL RDNA (DIAGNOSTIC) 1 MG IJ SOLR
1.0000 mg | Freq: Once | INTRAMUSCULAR | Status: AC
  Administered 2015-06-28: 1 mg via INTRAVENOUS
  Filled 2015-06-28: qty 1

## 2015-06-28 MED ORDER — DEXTROSE 50 % IV SOLN
INTRAVENOUS | Status: AC
  Administered 2015-06-28: 50 mL
  Filled 2015-06-28: qty 50

## 2015-06-28 MED ORDER — PHENYLEPHRINE HCL 10 MG/ML IJ SOLN
30.0000 ug/min | INTRAMUSCULAR | Status: DC
  Administered 2015-06-28: 120 ug/min via INTRAVENOUS
  Administered 2015-06-28: 200 ug/min via INTRAVENOUS
  Filled 2015-06-28 (×3): qty 4

## 2015-06-28 MED ORDER — SODIUM BICARBONATE 8.4 % IV SOLN
50.0000 meq | INTRAVENOUS | Status: AC
  Administered 2015-06-28: 50 meq via INTRAVENOUS

## 2015-06-28 MED ORDER — PHENYLEPHRINE HCL 10 MG/ML IJ SOLN
30.0000 ug/min | INTRAVENOUS | Status: DC
  Administered 2015-06-28: 80 ug/min via INTRAVENOUS
  Administered 2015-06-28: 40 ug/min via INTRAVENOUS
  Administered 2015-06-28: 35 ug/min via INTRAVENOUS
  Administered 2015-06-28: 140 ug/min via INTRAVENOUS
  Administered 2015-06-28: 30 ug/min via INTRAVENOUS
  Filled 2015-06-28 (×3): qty 1

## 2015-06-28 MED ORDER — DEXTROSE 50 % IV SOLN
1.0000 | Freq: Once | INTRAVENOUS | Status: AC
  Administered 2015-06-28: 50 mL via INTRAVENOUS
  Filled 2015-06-28: qty 50

## 2015-06-28 MED ORDER — GLUCAGON HCL RDNA (DIAGNOSTIC) 1 MG IJ SOLR
1.0000 mg | Freq: Once | INTRAMUSCULAR | Status: DC | PRN
  Filled 2015-06-28: qty 1

## 2015-06-28 MED ORDER — LACTATED RINGERS IV BOLUS (SEPSIS)
1000.0000 mL | Freq: Once | INTRAVENOUS | Status: AC
  Administered 2015-06-28: 1000 mL via INTRAVENOUS

## 2015-06-28 MED ORDER — DEXTROSE 50 % IV SOLN
INTRAVENOUS | Status: AC
  Administered 2015-06-28: 50 mL via INTRAVENOUS
  Filled 2015-06-28: qty 50

## 2015-06-28 MED ORDER — HYDROCORTISONE NA SUCCINATE PF 100 MG IJ SOLR
50.0000 mg | Freq: Four times a day (QID) | INTRAMUSCULAR | Status: DC
  Administered 2015-06-28 (×2): 50 mg via INTRAVENOUS
  Filled 2015-06-28 (×2): qty 2

## 2015-06-29 LAB — TYPE AND SCREEN
ABO/RH(D): O POS
Antibody Screen: NEGATIVE
UNIT DIVISION: 0
UNIT DIVISION: 0
Unit division: 0

## 2015-06-29 LAB — WOUND CULTURE: Special Requests: NORMAL

## 2015-06-29 LAB — CULTURE, BLOOD (ROUTINE X 2)

## 2015-06-29 LAB — URINE CULTURE

## 2015-06-29 MED FILL — Medication: Qty: 1 | Status: AC

## 2015-07-02 LAB — WOUND CULTURE: Special Requests: NORMAL

## 2015-07-02 LAB — PATHOLOGIST SMEAR REVIEW

## 2015-07-03 NOTE — Progress Notes (Signed)
Patient ID: ILDA KREITZ, female   DOB: 06-16-1945, 70 y.o.   MRN: [id number] [place] [facility] [place] NAME: [name]    MR#:  [id number]  DATE OF BIRTH:  03/11/1946  SUBJECTIVE:    REVIEW OF SYSTEMS:   ROS Tolerating Diet: Tolerating PT:   DRUG ALLERGIES:   Allergies  Allergen Reactions  . Codeine Other (See Comments)    Reaction:  Hallucinations    VITALS:  Blood pressure 77/44, pulse 124, temperature 100 F (37.8 C), temperature source Rectal, resp. rate 35, height 5\' 7"  (1.702 m), weight 83.915 kg (185 lb), SpO2 98 %.  PHYSICAL EXAMINATION:   Physical Exam  GENERAL:  70 y.o.-year-old patient lying in the bed with no acute distress.  EYES: Pupils equal, round, reactive to light and accommodation. No scleral icterus. Extraocular muscles intact.  HEENT: Head atraumatic, normocephalic. Oropharynx and nasopharynx clear.  NECK:  Supple, no jugular venous distention. No thyroid enlargement, no tenderness.  LUNGS: Normal breath sounds bilaterally, no wheezing, rales, rhonchi. No use of accessory muscles of respiration.  CARDIOVASCULAR: S1, S2 normal. No murmurs, rubs, or gallops.  ABDOMEN: Soft, nontender, nondistended. Bowel sounds present. No organomegaly or mass.  EXTREMITIES: No cyanosis, clubbing or edema b/l.    NEUROLOGIC: Cranial nerves II through XII are intact. No focal Motor or sensory deficits b/l.   PSYCHIATRIC:  patient is alert and oriented x 3.  SKIN: No obvious rash, lesion, or ulcer.   LABORATORY PANEL:  CBC  Recent Labs Lab 07-28-2015 0456  WBC 0.6*  HGB 8.4*  HCT 26.6*  PLT 63*    Chemistries   Recent Labs Lab 07-28-15 0500  NA 138  K 4.0  CL 118*  CO2 10*  GLUCOSE 223*  BUN 53*  CREATININE 2.51*  CALCIUM 6.9*  AST 29  ALT 8*  ALKPHOS 43  BILITOT 0.5   Cardiac Enzymes  Recent Labs Lab 06/23/2015 0940  TROPONINI 0.08*   RADIOLOGY:  Dg Chest 1 View  07-28-2015  CLINICAL  DATA:  Shortness of breath. EXAM: CHEST 1 VIEW COMPARISON:  06/08/2015. FINDINGS: Endotracheal tube, NG tube, right PICC line in stable position. Cardiomegaly with mild pulmonary vascular prominence interstitial prominence again noted. Small bilateral pleural effusions. Findings consistent mild congestive heart failure. Low lung volumes with bibasilar atelectasis and/or infiltrates. IMPRESSION: 1. Lines and tubes in stable position. 2. Persistent changes of congestive heart failure mild interstitial edema and bilateral effusions. 3. Low lung volumes with bibasilar atelectasis and/or infiltrates. These changes are progressive from prior exam . Electronically Signed   By: [place]   On: 2015-07-28 07:09   Dg Chest 1 View  06/29/2015  CLINICAL DATA:  Status post intubation. EXAM: CHEST 1 VIEW COMPARISON:  Earlier the same day FINDINGS: 1808 hours. Endotracheal tube tip is 3.2 cm above the base of the carina. The NG tube passes into the stomach although the distal tip position is not included on the film. Right PICC line tip overlies the mid SVC level. Patient is rotated to the right. The cardio pericardial silhouette is enlarged. There is pulmonary vascular congestion without overt pulmonary edema. Interstitial pulmonary edema is suspected. Small bilateral pleural effusions are noted. Telemetry leads overlie the chest. IMPRESSION: Endotracheal tube tip is 3.2 cm above the base of the carina. Cardiomegaly with vascular congestion and interstitial pulmonary edema. Small bilateral pleural effusions. Electronically Signed   By: Misty Stanley M.D.   On: 06/05/2015 18:22  Dg Abd 1 View  06/21/2015  CLINICAL DATA:  Enteric tube placement. EXAM: ABDOMEN - 1 VIEW COMPARISON:  10/06/2014 abdominal radiograph. FINDINGS: Enteric tube terminates in the body of the stomach. Enteric tube side port is intragastric. Mild gaseous distention of the stomach. No appreciable dilated small bowel loops. No evidence of  pneumatosis or pneumoperitoneum. Moderate degenerative changes in the visualized thoracolumbar spine. Vascular calcifications throughout the soft tissues. IMPRESSION: Enteric tube terminates in the body of the stomach. Electronically Signed   By: Ilona Sorrel M.D.   On: 06/13/2015 18:23   Dg Chest Port 1 View  06/20/2015  CLINICAL DATA:  Sepsis.  Code sepsis. EXAM: PORTABLE CHEST 1 VIEW COMPARISON:  05/29/2015 FINDINGS: PICC line tip in the lower SVC region. Patchy densities in the lower chest bilaterally. Heart size is enlarged. Negative for pneumothorax. IMPRESSION: Slightly increased densities in the lower chest bilaterally. Findings probably represent a combination of atelectasis and pleural fluid. Electronically Signed   By: Markus Daft M.D.   On: 06/25/2015 10:15   Dg Foot Complete Right  06/05/2015  CLINICAL DATA:  Right foot amputation EXAM: RIGHT FOOT COMPLETE - 3+ VIEW COMPARISON:  01/29/2015 FINDINGS: Midfoot amputation at the Lisfranc joint. Small bone fragments at the osteotomy /disarticulation may be postoperative. No gross bony erosion. There is extensive soft tissue gas in the overlying soft tissue flap, which likely reaches the cuboid. Negative for acute fracture. 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 When allowing for postoperative bone fragments there is no convincing osseous erosion. Electronically Signed   By: Monte Fantasia M.D.   On: 06/20/2015 10:21   ASSESSMENT AND PLAN:   70 year old female with past medical history of hypertension, type 2 diabetes without complication, peripheral vascular disease, anxiety, DJD, history of osteomyelitis, history of carotid artery disease, chronic kidney disease stage III who presented to the hospital due to altered mental status and noted to be septic.  #1 altered mental status/acute encephalopathy metabolic requiring mechanical intubation for airway protection -Day 1  status post intubation -this is metabolic encephalopathy secondary to the underlying infection/sepsis.  -Continue treatment for underlying sepsis -On fentanyl drip  #2 septic shock-source likely is the right foot and severe C. Difficile -Blood culture positive for gram-negative rod 2 out of 2 -Positive for Acinetobacter - Patient had a recent right foot osteomyelitis now comes in with a wound dehiscence and drainage at the same site. -Seen by podiatry Dr. Vickki Muff does not think right foot area is severely infected. However he recommends if that is the source patient will likely need below-knee amputation -On IV meropenem, tobramycin, vancomycin -ID consultation placed --continue IV fluids, antipyretics.  -Patient currently on Levothroid, phenylephrine, vasopressin   #3 right foot osteomyelitis-continue broad-spectrum IV antibiotics with vancomycin, meropenem and tobramycin -Appreciate Consult podiatry   #4 anemia-anemia of chronic disease secondary to her CTD stage III. -Hemoglobin is down to 6.2.--5.7--6.3--1 unit PRBC---8.4 -Status post transfused 1 unit and will follow hemoglobin. -We will Continue iron supplements when patient able  #5 acute on  chronic kidney disease stage III-patient's creatinine is close to baseline. We will follow her BUN and creatinine and renal dose meds Baseline creatinine 1.8--- 2.68--2.51 -Poor urine output likely ATN in the setting of septic shock  #6 severe C. difficile colitis -Continue IV fluids and IV Flagyl  #7 severe metabolic acidosis patient received several amps of bicarbonate Nephrology consultation placed  #8 severe neutropenia likely secondary to severe sepsis/DIC  #  9 GERD-continue IV Protonix  #10 persistent hypoglycemia patient received 2 Amps of D50 and now currently on D10 drip -Continue sugar checks every hourly  Patient is gravely ill, prognosis very poor, she remains a full code for now We'll discuss with family once they  arrive to the CCU  Case discussed with Care Management/Social Worker. CODE STATUS: Full  DVT Prophylaxis: Heparin  TOTAL CRITICAL TIME TAKING CARE OF THIS PATIENT: 45 minutes.  >50% time spent on counselling and coordination of care    Note: This dictation was prepared with Dragon dictation along with smaller phrase technology. Any transcriptional errors that result from this process are unintentional.  Aleksis Jiggetts M.D on 2015-07-09 at 7:36 AM  Between 7am to 6pm - Pager - [phone]  After 6pm go to [email or web address] - password [email or web address] [place] [facility]  Office  [phone]  CC: Primary care physician; [facility]

## 2015-07-03 NOTE — Progress Notes (Signed)
[place] Progress Note Patient Name: [name] DOB: 12/29/1945 MRN: [id number]   Date of Service  07-08-15  HPI/Events of Note  Pt has  Deteriorated and is now bradycardic at 34s. Nurse called to ask permission to pronounce pt when she expires. Family at bedisde and is aware  eICU Interventions  Allowed nurse to pronounce pt when she expires.      Intervention Category Major Interventions: Other:  R[place] 08-Jul-2015, 6:26 PM

## 2015-07-03 NOTE — Progress Notes (Signed)
Dr. Ashby Dawes present and gave order to give 1L bolus of bicarb drip.

## 2015-07-03 NOTE — Progress Notes (Signed)
Dr. Ashby Dawes present at bedside and aware that when checking patient's blood glucose this morning meter read LOW and would not give a number. AMP of D50 given. MD gave order for glucagon once and to increase d10 infusion to 50cc/H.  MD also aware of low blood pressure and that patient is on vasopressin and neo drip along with being maxed on levophed drip.  Dr. Ashby Dawes gave order to increase vasopressin drip to 0.04 units/min, and to give 1L lactated ringer bolus. MD also gave order for [facility] fingersticks.

## 2015-07-03 NOTE — Progress Notes (Signed)
   2015/07/13 1830  Clinical Encounter Type  Visited With Patient and family together  Visit Type Death  Referral From Nurse  Consult/Referral To Chaplain  Spiritual Encounters  Spiritual Needs Grief support;Sacred text;Prayer  Stress Factors  Patient Stress Factors Loss  Family Stress Factors Loss  Provided grief care to family. Holy Scripture and prayer. Compassionate presence.  Chap. Phylicia Mcgaugh G. [place]

## 2015-07-03 NOTE — Progress Notes (Signed)
Notified Dr Fritzi Mandes of patient having an 18 beat run of Vtach.  Acknowledged, no new orders.

## 2015-07-03 NOTE — Progress Notes (Signed)
Inpatient Diabetes Program Recommendations  AACE/ADA: New Consensus Statement on Inpatient Glycemic Control (2015)  Target Ranges:  Prepandial:   less than 140 mg/dL      Peak postprandial:   less than 180 mg/dL (1-2 hours)      Critically ill patients:  140 - 180 mg/dL   Review of Glycemic Control  Diabetes history: Type 2 Outpatient Diabetes medications: Novolog 0-9 units tid on Monday, Wednesday, Thursday, Saturday Current orders for Inpatient glycemic control: Novolog 0-9 units q4h  Inpatient Diabetes Program Recommendations: Please consider consulting Dr. Gabriel Carina if patient continues to have recurrent hypoglycemia.  Gentry Fitz, RN, BA, MHA, CDE Diabetes Coordinator Inpatient Diabetes Program  [phone] (Team Pager) [phone] ([place]) 2015-07-21 9:24 AM

## 2015-07-03 NOTE — Progress Notes (Signed)
RN paged [facility] physician and spoke with Dr. Murlean Iba at [phone] and made MD aware that patient is maxed out on all vasopressors including vasopressin, neo, and levophed drip and that blood pressure is staying in 0000000 systolically. Unable to obtain ABG when attempted by RT and unable to obtain pulse oximetry after trying fingers, ears and forehead.  Dr. Murlean Iba spoke with patients son on the phone and updated him. Husband present also.

## 2015-07-03 NOTE — Progress Notes (Signed)
Blood glucose rechecked after amp of d50 given and is 142.

## 2015-07-03 NOTE — Progress Notes (Signed)
  Responded to a call from ICU nurse due to do an e-Link request for arterial line 71 year old female critically ill with sepsis from an unknown source. Currently requiring vasopressor support to maintain blood pressure and MAPs.  Although the patient is critically ill and requiring pressors her maps were stable during my interactions. Patient did not awaken during my examination. Radial arteries difficult to palpate secondary to fluid overload and current vasopressors Femoral arteries palpable but require compression of large volume edema in order to reach them.  I do not believe this patient warrants any emergent A-line at this time Plan to defer placement of arterial line to primary team in the morning  Please call again if general surgery can be of any further assistance  Clayburn Pert, MD [place] Surgeon [facility]

## 2015-07-03 NOTE — Progress Notes (Signed)
Blood glucose rechecked and 84.

## 2015-07-03 NOTE — Progress Notes (Addendum)
[place] [facility] Progess Note    ASSESSMENT/PLAN    70 year old female with osteomyelitis, status post transmetatarsal amputation with history of C. difficile, blood PCR positive for Acinetobacter. Now with septic shock, dehydration, diarrhea. Intubated due to respiratory distress and encephalopathy.  PULMONARY  A: Acute respiratory failure, likely due to severe septic shock and metabolic encephalopathy. ABG reviewed.  7.10/30/53/8.2, consistent with severe metabolic acidosis, worsened from yesterday. -Ventilator dependent. P:   -Continue current vent support settings, PRVC/16/450/5.  Intubated 2/23 Chest x-ray and abdominal x-ray images from 2/23 reviewed: Increasing Bibasilar atelectasis, right> left adequate placement of ET and orogastric tubes.  CARDIOVASCULAR  A: Severe septic shock with hypotension. -Severe metabolic acidosis, secondary to septic shock. -Worsening hypotension, now on Levophed at 50 mics, phenylephrine drip, vasopressin drip. P:  -Continue IV fluid resuscitation. -We'll change IV fluids to a bicarbonate drip, will run at 150 mL per hour. -Monitor CVP. -Titrate pressors maintain map of 65 or greater, may need to add vasopressin if needed. -Repeat ABG after giving bicarbonate pushes and bicarbonate drip. -Recheck lactic acid.  DVT prophylaxis: subcutaneous heparin.  RENAL A:  Acute kidney injury on chronic kidney disease. P:   -Continue IV fluids, continue to monitor renal function and urine output.  GASTROINTESTINAL A: Diarrhea with dehydration, C. difficile PCR positive, await toxin results. P:   Continue IV Flagyl, IV vancomycin, IV Merrem, oral vancomycin.  -We'll await further infectious disease input.  GI prophylaxis: PPI.  HEMATOLOGIC A:  Anemia, uncertain etiology. -Hemoglobin increased to 8.4 from 6.3 after transfusion of 2 units packed red blood cells. -Leukopenia due to septic shock.   P:  Monitor CBC, transfuse  as needed. check differential  Check INR.    INFECTIOUS A:  Septic shock, uncertain etiology,  But likely from  lower extremity wound versus C. Difficile.  -? Line sepsis . PICC line appears clean at this time. -Peripheral Blood cultures positive for gram-negative rods, PCR positive for Acinetobacter.  P:   -vancomycin, ID is consulted. -We'll obtain cultures from PICC line and peripherally. -We will remove the PICC if line cultures are positive.  Microbiology results: 2/23 BCx: Positive for gram-negative rods, PCR positive for Acinetobacter. 2/23 wound: in process C. difficile PCR 2/23: Positive Urine culture 2/23: Pending Blood culture 2 . PICC line 2/23: Pending   Antimicrobials this admission: zosyn 2/23 >>  2/23 IV vancomycin 2/23 >>  Oral vancomycin 2/23>> IV Flagyl 2/23>>  meropenem 2/23 >>  ENDOCRINE A:  Hypoglycemia -Severe protein malnutrition, albumin 1.1, total protein 3.8.  P:   Monitor blood glucosesincreased D10 to 50 mL per hour. -Monitor blood glucose every hour. -The patient is been started on a bicarbonate drip in D5, which should help.   NEUROLOGIC A:  Metabolic encephalopathy, due to illness. P:   Continue on sedation per protocol, currently on fentanyl.    MAJOR EVENTS/TEST RESULTS: Intubated 06/08/2015.  INDWELLING DEVICES:: Intubated 2/23. OG tube. 2/23. Right dual lumen PICC line placed 05/29/15.  ---------------------------------------   ----------------------------------------   Name: KATERA ACHEY MRN: [id number] DOB: January 26, 1946    ADMISSION DATE:  2/23/2017Respiratory failure   SUBJECTIVE:   Pt currently on the ventilator, can not provide history or review of systems.     VITAL SIGNS: Temp:  [96.7 F (35.9 C)-100.8 F (38.2 C)] 96.7 F (35.9 C) (02/23 1730) Pulse Rate:  [74-117] 88 (02/23 1845) Resp:  [9-29] 14 (02/23 1845) BP: (58-123)/(36-76) 110/50 mmHg (02/23 1845) SpO2:  [84 %-100 %] 99 % (  02/23 1845) FiO2  (%):  [60 %] 60 % (02/23 1745) Weight:  [185 lb (83.915 kg)] 185 lb (83.915 kg) (02/23 0912) HEMODYNAMICS:   VENTILATOR SETTINGS: Vent Mode:  [-] PRVC FiO2 (%):  [60 %] 60 % Set Rate:  [16 bmp] 16 bmp Vt Set:  [450 mL] 450 mL PEEP:  [5 cmH20] 5 cmH20 INTAKE / OUTPUT: No intake or output data in the 24 hours ending 06/20/2015 1933  PHYSICAL EXAMINATION: Physical Examination:   VS: BP 110/50 mmHg  Pulse 88  Temp(Src) 96.7 F (35.9 C) (Axillary)  Resp 14  Ht 5\' 7"  (1.702 m)  Wt 185 lb (83.915 kg)  BMI 28.97 kg/m2  SpO2 99%  General Appearance: No distress  Neuro:without focal findings, mental status normal. HEENT: PERRLA, EOM intact. Pulmonary: normal breath sounds   CardiovascularNormal S1,S2.  No m/r/g.   Abdomen: Benign, Soft, non-tender. Renal:  No costovertebral tenderness  GU:  Not performed at this time. Endocrine: No evident thyromegaly. Skin:   warm, no rashes, no ecchymosis  Extremities: normal, no cyanosis, clubbing.   LABS:   LABORATORY PANEL:   CBC  Recent Labs Lab 06/10/2015 0940 06/06/2015 1710  WBC 1.5*  --   HGB 6.2* 5.7*  HCT 19.2*  --   PLT 101*  --     Chemistries   Recent Labs Lab 06/09/2015 0940  NA 143  K 4.8  CL 121*  CO2 16*  GLUCOSE 66  BUN 62*  CREATININE 2.68*  CALCIUM 8.2*  AST 19  ALT 8*  ALKPHOS 52  BILITOT 0.5     Recent Labs Lab 06/05/2015 1548 06/21/2015 1557 06/21/2015 1641 06/20/2015 1749  GLUCAP 36* 41* 131* 102*    Recent Labs Lab 06/30/2015 1700 06/22/2015 1810  PHART 7.22* 7.13*  PCO2ART 32 39  PO2ART 56* 114*    Recent Labs Lab 06/21/2015 0940  AST 19  ALT 8*  ALKPHOS 52  BILITOT 0.5  ALBUMIN 1.6*    Cardiac Enzymes  Recent Labs Lab 06/11/2015 0940  TROPONINI 0.08*    RADIOLOGY:  Dg Chest 1 View  06/24/2015  CLINICAL DATA:  Status post intubation. EXAM: CHEST 1 VIEW COMPARISON:  Earlier the same day FINDINGS: 1808 hours. Endotracheal tube tip is 3.2 cm above the base of the carina. The NG  tube passes into the stomach although the distal tip position is not included on the film. Right PICC line tip overlies the mid SVC level. Patient is rotated to the right. The cardio pericardial silhouette is enlarged. There is pulmonary vascular congestion without overt pulmonary edema. Interstitial pulmonary edema is suspected. Small bilateral pleural effusions are noted. Telemetry leads overlie the chest. IMPRESSION: Endotracheal tube tip is 3.2 cm above the base of the carina. Cardiomegaly with vascular congestion and interstitial pulmonary edema. Small bilateral pleural effusions. Electronically Signed   By: Misty Stanley M.D.   On: 06/26/2015 18:22   Dg Abd 1 View  07/01/2015  CLINICAL DATA:  Enteric tube placement. EXAM: ABDOMEN - 1 VIEW COMPARISON:  10/06/2014 abdominal radiograph. FINDINGS: Enteric tube terminates in the body of the stomach. Enteric tube side port is intragastric. Mild gaseous distention of the stomach. No appreciable dilated small bowel loops. No evidence of pneumatosis or pneumoperitoneum. Moderate degenerative changes in the visualized thoracolumbar spine. Vascular calcifications throughout the soft tissues. IMPRESSION: Enteric tube terminates in the body of the stomach. Electronically Signed   By: Ilona Sorrel M.D.   On: 06/27/2015 18:23   Dg Chest Port 1  View  06/15/2015  CLINICAL DATA:  Sepsis.  Code sepsis. EXAM: PORTABLE CHEST 1 VIEW COMPARISON:  05/29/2015 FINDINGS: PICC line tip in the lower SVC region. Patchy densities in the lower chest bilaterally. Heart size is enlarged. Negative for pneumothorax. IMPRESSION: Slightly increased densities in the lower chest bilaterally. Findings probably represent a combination of atelectasis and pleural fluid. Electronically Signed   By: Markus Daft M.D.   On: 07/01/2015 10:15   Dg Foot Complete Right  06/20/2015  CLINICAL DATA:  Right foot amputation EXAM: RIGHT FOOT COMPLETE - 3+ VIEW COMPARISON:  01/29/2015 FINDINGS: Midfoot  amputation at the Lisfranc joint. Small bone fragments at the osteotomy /disarticulation may be postoperative. No gross bony erosion. There is extensive soft tissue gas in the overlying soft tissue flap, which likely reaches the cuboid. Negative for acute fracture. 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 When allowing for postoperative bone fragments there is no convincing osseous erosion. Electronically Signed   By: Monte Fantasia M.D.   On: 06/15/2015 10:21       --Marda Stalker, MD.  Pager [phone] [facility]   Patricia Pesa, M.D.  Vilinda Boehringer, M.D.  Merton Border, M.D   [place].  I have personally obtained a history, examined the patient, evaluated laboratory and imaging results, formulated the assessment and plan and placed orders. The Patient requires high complexity decision making for assessment and support, frequent evaluation and titration of therapies, application of advanced monitoring technologies and extensive interpretation of multiple databases. The patient has critical illness that could lead imminently to failure of 1 or more organ systems and requires the highest level of physician preparedness to intervene.  Critical Care Time devoted to patient care services described in this note is 35 minutes and is exclusive of time spent in procedures.

## 2015-07-03 NOTE — Progress Notes (Signed)
RN has pronounced patient expired, RT to room to remove patient from vent and take vent out of room.

## 2015-07-03 NOTE — Progress Notes (Signed)
eLink Physician-Brief Progress Note Patient Name: [name] DOB: 10/05/1945 MRN: [id number]   Date of Service  07/18/2015  HPI/Events of Note  Ongoing metabolic acidosis with pH 7.11/34/65/11  eICU Interventions  Plan: Increase RR on vent to 35 2 amps of bicarb IVP Check lacate Consider bicarb gtt     Intervention Category Major Interventions: Acid-Base disturbance - evaluation and management  [name] Jul 18, 2015, 3:49 AM

## 2015-07-03 NOTE — Progress Notes (Signed)
RN spoke with Dr. Posey Pronto on the phone and made MD aware that all three vasopressors are maxed out and blood pressure systolically 0000000 and unable to obtain ABG and that [facility] physician spoke with patient's son.  RN asked Dr. Posey Pronto about comfort care.  Dr. Posey Pronto to call back and speak with patient's son.

## 2015-07-03 NOTE — Progress Notes (Signed)
RN notified Dr. Posey Pronto of lactic acid of 4.8, MD acknowledged and gave no new orders.

## 2015-07-03 NOTE — Progress Notes (Signed)
[place] Progress Note Patient Name: [name] DOB: [birth year] MRN: [id number]   Date of Service  07/04/15  HPI/Events of Note  Pt is in septic shock,severe acidosis, max on 3 pressors. Nurse wanted me to update family. Pt is dnr.   eICU Interventions  Updated son [place] 07-04-15, 4:32 PM

## 2015-07-03 NOTE — Progress Notes (Signed)
This RN notified [facility] nurse in patient's room that patient had expired. This RN also notified Dr. Leslye Peer and Kennyth Lose, RN supervisor that patient expired at 1902.

## 2015-07-03 NOTE — Progress Notes (Signed)
Chaplain rounded the unit and provided a compassionate presence and support to the patient and family. Chaplain provided a silent prayer. [name] [phone]

## 2015-07-03 NOTE — NC FL2 (Signed)
[place] LEVEL OF CARE SCREENING TOOL     IDENTIFICATION  Patient Name: [name] Birthdate: Mar 17, 1946 Sex: female Admission Date (Current Location): 06/08/2015  [place] and [place] Number:  Engineering geologist and Address:  [facility], [address], [place], [place] [address]      Provider Number: [id number]  Attending Physician Name and Address:  Fritzi Mandes, MD  Relative Name and Phone Number:       Current Level of Care: Hospital Recommended Level of Care: [place] Prior Approval Number:    Date Approved/Denied:   PASRR Number:  ([id number])  Discharge Plan: SNF    Current Diagnoses: Patient Active Problem List   Diagnosis Date Noted  . Sepsis ([place]) 06/30/2015  . Sacral pressure ulcer 06/05/2015  . Gangrene of foot ([place]) 06/04/2015  . Osteomyelitis ([place]) 05/27/2015  . 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 Osteomyelitis of ankle or foot ([place]) 04/02/2015  . Weight loss 02/21/2015  . Folate deficiency 02/21/2015  . Cellulitis of great toe of right foot 02/02/2015  . Diabetes mellitus ([place]) 02/02/2015  . CKD (chronic kidney disease), stage III 02/02/2015  . Anemia of chronic disease 02/02/2015  . Anemia 01/30/2015  . Infected wound ([place]) 01/29/2015  . Subdural hematoma ([place]) 12/06/2014  . Pressure ulcer 12/05/2014  . SDH (subdural hematoma) ([place]) 12/03/2014  . Chronic diastolic heart failure ([place]) 11/02/2014  . Melena 10/02/2014  . DM (diabetes mellitus) ([place]) 02/04/2010  . Deficiency anemia 02/04/2010  . Essential hypertension 02/04/2010  . Coronary atherosclerosis 02/04/2010    Orientation RESPIRATION BLADDER Height & Weight     Self  Vent Incontinent Weight: 185 lb (83.915 kg) Height:  5\' 7"  (170.2 cm)  BEHAVIORAL SYMPTOMS/MOOD NEUROLOGICAL BOWEL NUTRITION STATUS   (None)  (None) Incontinent Diet (NPO)  AMBULATORY STATUS COMMUNICATION OF NEEDS Skin   Extensive Assist Verbally  Other (Comment) (Pressure Ulcer Stage 2 Sacrum & Pressure Ulcer unstageable- Right foot)                       Personal Care Assistance Level of Assistance  Bathing, Feeding, Dressing Bathing Assistance: Limited assistance Feeding assistance: Independent Dressing Assistance: Limited assistance     Functional Limitations Info  Sight, Hearing, Speech Sight Info: Adequate Hearing Info: Adequate Speech Info: Adequate    SPECIAL CARE FACTORS FREQUENCY  PT (By licensed PT)     PT Frequency:  (5) OT Frequency:  (5)            Contractures      Additional Factors Info  Code Status Code Status Info:  (DNR) Allergies Info:  (Codeine)   Insulin Sliding Scale Info:  (insulin aspart (novoLOG) injection 0-9 Units 0-9 Units, Subcutaneous, 6 times per day ) Isolation Precautions Info:  (Enteric precautions (UV disinfection))     Current Medications (Jul 02, 2015):  This is the current hospital active medication list Current Facility-Administered Medications  Medication Dose Route Frequency Provider Last Rate Last Dose  . acetaminophen (TYLENOL) tablet 650 mg  650 mg Oral Q6H PRN Henreitta Leber, MD       Or  . acetaminophen (TYLENOL) suppository 650 mg  650 mg Rectal Q6H PRN Henreitta Leber, MD      . dextrose 10 % infusion   Intravenous Continuous Laverle Hobby, MD 50 mL/hr at 07-02-2015 0817    . fentaNYL 2533mcg in NS 277mL (67mcg/ml) infusion-PREMIX  10 mcg/hr Intravenous Continuous Henreitta Leber, MD 2.5 mL/hr  at 07/08/2015 1042 25 mcg/hr at 07/08/15 1042  . ferrous sulfate tablet 325 mg  325 mg Oral QAC supper Henreitta Leber, MD   325 mg at 07-08-2015 0028  . heparin injection 5,000 Units  5,000 Units Subcutaneous 3 times per day Laverle Hobby, MD   5,000 Units at July 08, 2015 1312  . hydrocortisone sodium succinate (SOLU-CORTEF) 100 MG injection 50 mg  50 mg Intravenous Q6H Laverle Hobby, MD   50 mg at Jul 08, 2015 1202  . insulin aspart (novoLOG) injection 0-9  Units  0-9 Units Subcutaneous 6 times per day Colbert Coyer, MD   0 Units at July 08, 2015 0122  . meropenem (MERREM) 500 mg in sodium chloride 0.9 % 50 mL IVPB  500 mg Intravenous Q12H Srikar Sudini, MD   500 mg at Jul 08, 2015 0934  . mirtazapine (REMERON) tablet 15 mg  15 mg Oral QHS Henreitta Leber, MD   15 mg at 06/08/2015 2215  . norepinephrine (LEVOPHED) 16 mg in dextrose 5 % 250 mL (0.064 mg/mL) infusion  0-40 mcg/min Intravenous Titrated Henreitta Leber, MD 37.5 mL/hr at 07/08/15 0231 40 mcg/min at 07/08/2015 0231  . norepinephrine (LEVOPHED) 4 mg in dextrose 5 % 250 mL (0.016 mg/mL) infusion  0-40 mcg/min Intravenous Titrated Henreitta Leber, MD 112.5 mL/hr at 06/30/2015 1735 30 mcg/min at 06/24/2015 1735  . ondansetron (ZOFRAN) tablet 4 mg  4 mg Oral Q6H PRN Henreitta Leber, MD       Or  . ondansetron (ZOFRAN) injection 4 mg  4 mg Intravenous Q6H PRN Henreitta Leber, MD      . pantoprazole (PROTONIX) EC tablet 40 mg  40 mg Oral Daily Henreitta Leber, MD   40 mg at 07-08-15 0934  . phenylephrine (NEO-SYNEPHRINE) 40 mg in dextrose 5 % 250 mL (0.16 mg/mL) infusion  30-200 mcg/min Intravenous Continuous Fritzi Mandes, MD 60 mL/hr at 07-08-15 1500 160 mcg/min at 08-Jul-2015 1500  . sodium bicarbonate 1 mEq/mL injection           . sodium bicarbonate 150 mEq in dextrose 5 % 1,000 mL infusion   Intravenous Continuous Laverle Hobby, MD 150 mL/hr at 08-Jul-2015 1413    . [START ON 06/29/2015] tobramycin (NEBCIN) 490 mg in dextrose 5 % 100 mL IVPB  7 mg/kg (Adjusted) Intravenous Q36H Fritzi Mandes, MD      . vancomycin (VANCOCIN) 50 mg/mL oral solution 125 mg  125 mg Oral 4 times per day Laverle Hobby, MD   125 mg at 07-08-15 1202  . vancomycin (VANCOCIN) IVPB 1000 mg/200 mL premix  1,000 mg Intravenous Q36H Henreitta Leber, MD   1,000 mg at 06/23/2015 1706  . vasopressin (PITRESSIN) 40 Units in sodium chloride 0.9 % 250 mL (0.16 Units/mL) infusion  0.04 Units/min Intravenous Continuous Laverle Hobby,  MD 15 mL/hr at 07-08-2015 0813 0.04 Units/min at July 08, 2015 0813     Discharge Medications: Please see discharge summary for a list of discharge medications.  Relevant Imaging Results:  Relevant Lab Results:   Additional Information  (SSN [id number])  Lorenso Quarry Tyreka Henneke, LCSW

## 2015-07-03 NOTE — Progress Notes (Signed)
Prolonged asystole on cardiac monitor. This RN attempted to doppler femoral pulse but unable to obtain dopplered pulse. This RN and [employer], RN each auscultated for heart tones and none heard, therefore time of death was pronounced at [facility].  Husband and Son at bedside along with multiple family members. Angie, RT called to come turn ventilator off.

## 2015-07-03 NOTE — Progress Notes (Signed)
Notified elink of lactic acid level of 2.8.  Also notified elink of serum glucose level.  D10 drip decreased to 37ml/hr.

## 2015-07-03 NOTE — Progress Notes (Signed)
[facility] Progress Note Patient Name: ROYALTI DEWATERS DOB: 16-Dec-1945 MRN: [id number]   Date of Service  2015-07-06  HPI/Events of Note  Ongoing metabolic acidosis with pH 7.13/39/114/13 at 1800.  No F/U ABG.  eICU Interventions  Plan: Increase RR on vent to 30 2 amps of bicarb IVP Recheck ABG in 1 hour     Intervention Category Major Interventions: Acid-Base disturbance - evaluation and management  DETERDING,ELIZABETH 07-06-15, 1:20 AM

## 2015-07-03 NOTE — Progress Notes (Signed)
Initial Nutrition Assessment    INTERVENTION:   EN: pt not medically stable for safe initiation of EN at present, will reassess on follow-up  NUTRITION DIAGNOSIS:   Inadequate oral intake related to acute illness as evidenced by NPO status.  GOAL:   Provide needs based on ASPEN/SCCM guidelines  MONITOR:    (Energy Intake, Digestive System, Anthropometrics, Pulmonary, Electrolyte/Renal Profile, GLucose Profile)  REASON FOR ASSESSMENT:   Ventilator    ASSESSMENT:    Pt admitted with AMS, severe septic shock, acute respiratory failure with metabolic acidosis requiring intubation, C.diff colitis; pt currently critically ill, sedated on vent, on levophed vasopressin and neosynephrine; pt with recent admission for osteomyelitis with transmetatarsal amputation  Past Medical History  Diagnosis Date  . Hypertension   . Diabetes mellitus without complication ([place])   . Anemia   . Coronary artery disease   . CHF (congestive heart failure) ([place])   . Glaucoma   . Hyperlipidemia   . Neuropathy ([place])   . Chronic anemia   . Stroke ([place])   . Anginal pain ([place])   . Arthritis   . Retinopathy   . Cardiomyopathy ([place])   . Carotid artery stenosis   . Hoarseness, chronic   . GERD (gastroesophageal reflux disease)   . Hemangioma of liver   . Weight loss, unintentional   . DDD (degenerative disc disease), lumbar   . Osteomyelitis of foot, right, acute ([place])   . Chronic kidney disease     Stage 3    Diet Order:  Diet NPO time specified Except for: Sips with Meds  Skin:   stage II on sacrum, diabetic foot ulcer  Digestive System: C.diff diarrhea, abdomen taut, BS hypoactive   Recent Labs Lab 06/26/2015 0940 July 28, 2015 0500  NA 143 138  K 4.8 4.0  CL 121* 118*  CO2 16* 10*  BUN 62* 53*  CREATININE 2.68* 2.51*  CALCIUM 8.2* 6.9*  GLUCOSE 66 223*    Glucose Profile:   Recent Labs  28-Jul-2015 0921 2015/07/28 0923 July 28, 2015 1026  GLUCAP 40* 47* 96   Protein Profile:    Recent Labs Lab 07/02/2015 0940 28-Jul-2015 0500  ALBUMIN 1.6* 1.1*   Nutritional Anemia Profile:  CBC Latest Ref Rng 07/28/2015 07-28-2015 06/29/2015  WBC 3.6 - 11.0 K/uL 1.0(LL) 0.6(LL) -  Hemoglobin 12.0 - 16.0 g/dL 8.1(L) 8.4(L) 6.3(L)  Hematocrit 35.0 - 47.0 % 26.7(L) 26.6(L) -  Platelets 150 - 440 K/uL 34(L) 63(L) -    Meds: sodium bicarb at 150 ml/hr, levophed, vasopressin, neo-synephrine, ss novolog, D10 at 50 ml/hr  Height:   Ht Readings from Last 1 Encounters:  06/07/2015 5\' 7"  (1.702 m)    Weight:   Wt Readings from Last 1 Encounters:  06/30/2015 185 lb (83.915 kg)   BMI:  Body mass index is 28.97 kg/(m^2).  Estimated Nutritional Needs:   Kcal:  2032 kcals (BEE 1391, Ve: 16, Tmax: 38.4) using wt of 83.9 kg  Protein:  126-168 g(1.5-2.0 g/kg)   Fluid:  2100-2520 mL (25-30 ml/kg)   HIGH Care Level  Kerman Passey MS, RD, LDN [phone] Pager  [phone] Weekend/On-Call Pager

## 2015-07-03 NOTE — Progress Notes (Signed)
[place], RN charge nurse to call CDS.

## 2015-07-03 NOTE — Progress Notes (Signed)
Made vent changes per Dr. Deterring's order. Rt's do not place A-lines in this facility.

## 2015-07-03 NOTE — Progress Notes (Signed)
Blood glucose rechecked and is 103.

## 2015-07-03 NOTE — Progress Notes (Signed)
Blood gas results given to Dr. Ashby Dawes and MD gave order to give 2 amps of bicarb now and he would order a bicarb drip.

## 2015-07-03 NOTE — Care Management (Signed)
Patient presents from [facility] with sx concerning for sepsis.  She has been on agressive IV antibiotic therapy at the facility.  She is currently intubated and on pressors.  She has just been amde DNR. Patient is gravely ill and there is concern as to whether she will survive this episode of illness.  CSW updated

## 2015-07-03 NOTE — Consult Note (Signed)
Pharmacy Antibiotic Note  [name] is a 70 y.o. female admitted on 06/24/2015 with sepsis.  Pharmacy has been consulted for zosyn and vancomycin dosing. Patient was discharged about 3 weeks ago after being tx for osteomyelitis and undergoing transmetatarsal amputation. Patient was discharged on bactrim and IV zosyn. IV vancomycin was added yesterday at 500mg  daily at 1600. Patient is now septic. Patient was receiving zosyn at 0600, 1400 and 2200. On last admission patient was on vancomycin 1000mg  q 36 hours with a trough of 18. Renal function is about the same as pervious admission.  Plan: Vancomycin 1000 IV every 36 hours.  Goal trough 15-20 mcg/mL. Zosyn 3.375g IV q8h (4 hour infusion).   2/24 tobramycin 7 mg/kg q 48 hours added. First level ~8 hours after dose given.  Height: 5\' 7"  (170.2 cm) Weight: 185 lb (83.915 kg) IBW/kg (Calculated) : 61.6  Temp (24hrs), Avg:97.2 F (36.2 C), Min:93.6 F (34.2 C), Max:100.8 F (38.2 C)   Recent Labs Lab 06/18/2015 0940 06/21/2015 0941 06/27/15 1258  WBC 1.5*  --   --   CREATININE 2.68*  --   --   LATICACIDVEN  --  1.5 1.2    Estimated Creatinine Clearance: 21.7 mL/min (by C-G formula based on Cr of 2.68).    Allergies  Allergen Reactions  . Codeine Other (See Comments)    Reaction:  Hallucinations    Antimicrobials this admission: zosyn 2/23 >>  vancomycin 2/23 >>   Dose adjustments this admission: Vancomycin changed from 500mg  q 24hr to 1000mg  q 36 hours  Microbiology results: 2/23 BCx: in process 2/23 wound: in process 2/23 PM [facility] resulted Acinetobacter baumanii from blood cultures. Meropenem and tobramycin started per conversation with hospitalist.  Thank you for allowing pharmacy to be a part of this patient's care.  [name] S 2015/07/22 12:50 AM

## 2015-07-03 NOTE — Progress Notes (Addendum)
Amp of D50 given for low blood glucose. RN notified Dr. Corrie Dandy that patient's rhythm is bradying down into the 40's. Dr. Corrie Dandy placed order for nurse may pronounce.

## 2015-07-03 NOTE — Consult Note (Signed)
Pharmacy Antibiotic Note  [name] is a 70 y.o. female admitted on 06/21/2015 with sepsis. Blood culture growing Acinetobacter. Patient also has C diff. Patient is currently on vancomycin, meropenem, tobramycin, oral vancomycin, and oral Flagyl.   Plan: Tobramycin dosing of 7 mg/kg q 48 hours with level on [place] nomogram consistent with q 36 hour dosing. Will adjust tobramycin to 7 mg/kg q 36 hours.   Will continue vancomycin iv q 36 hours. Trough scheduled with the 4th dose.   Discussed C diff therapy with Dr. Ashby Dawes as patient is on both oral vancomycin and metronidazole. Will d/c metronidazole.      Height: 5\' 7"  (170.2 cm) Weight: 185 lb (83.915 kg) IBW/kg (Calculated) : 61.6  Temp (24hrs), Avg:98.4 F (36.9 C), Min:93.6 F (34.2 C), Max:101.1 F (38.4 C)   Recent Labs Lab 06/06/2015 0940 06/17/2015 0941 06/07/2015 1258 Jul 26, 2015 0349 26-Jul-2015 0456 2015/07/26 0500 07/26/2015 0944 07/26/2015 0945  WBC 1.5*  --   --   --  0.6*  --   --   --   CREATININE 2.68*  --   --   --   --  2.51*  --   --   LATICACIDVEN  --  1.5 1.2 2.8*  --   --  4.8*  --   TOBRARND  --   --   --   --   --   --   --  7.0    Estimated Creatinine Clearance: 23.2 mL/min (by C-G formula based on Cr of 2.51).    Allergies  Allergen Reactions  . Codeine Other (See Comments)    Reaction:  Hallucinations    Antimicrobials this admission: zosyn 2/23 >> 02/23 vancomycin 2/23 >>  Meropenem 02/23 >> Metronidazole 02/23 >> 02/24 Oral vancomycin 02/23 >> Tobramycin 02/24 >>  Dose adjustments this admission: Vancomycin changed from 500mg  q 24hr to 1000mg  q 36 hours Tobramycin changed to q 36 hours  Microbiology results: 2/23 BCx: GNR/Acinetobacter 2/23 wound: in process 2/23 PM [facility] resulted Acinetobacter baumanii from blood cultures. Meropenem and tobramycin started per conversation with hospitalist. 02/23 MRSA PCR negative 02/23 C diff positive  Thank you for allowing pharmacy to be a  part of this patient's care.  Ulice Dash D 2015-07-26 11:33 AM

## 2015-07-03 NOTE — Clinical Social Work Note (Signed)
Clinical Social Work Assessment  Patient Details  Name: [name] MRN: [id number] Date of Birth: 1946/03/23  Date of referral:  09-Jul-2015               Reason for consult:  Discharge Planning                Permission sought to share information with:  Family Supports Permission granted to share information::  Yes, Verbal Permission Granted  Name::        Agency::     Relationship::   [name] and [name] (son and Daughter in [place]) )  Contact Information:     Housing/Transportation Living arrangements for the past 2 months:  [place] ([facility]) Source of Information:  Adult Children [name] and [name] (son and Daughter in [place]) ) Patient Interpreter Needed:  None Criminal Activity/Legal Involvement Pertinent to Current Situation/Hospitalization:  No - Comment as needed Significant Relationships:  Adult Children [name] and [name] (son and Daughter in [place]) ) Lives with:  Facility Resident ([facility]) Do you feel safe going back to the place where you live?  Yes Need for family participation in patient care:  Yes (Comment) [name] and [name] (son and Daughter in [place]) )  Care giving concerns:  Patient's family informed RN that patient came from [facility] where she was completing Short-term-rehab (STR)    Social Worker assessment / plan:  CSW was consulted by RN Case Manager because patient was a STR resident at [facility] be for hospital admission. CSW went to speak to patient but patient was asleep and not easily awaken. Informed by RN that patient's family was sitting in the waiting room. CSW spoke to patient's daughter-in-law [name]. CSW explained her role. Per [name] patient was receiving STR at [facility] before hospital admission. She reports that patient was "running a fever" and transported to [facility]. Patient's daughter was tearful throughout assessment. CSW provided emotional support.She reports that her husband works for [employer] and he's been receiving a lot of support form staff. Per  [name] patient has had issues with her foot for the past two months and [facility] after he most recent surgery. She sated that patient may return to [facility] when medically stable. Reports that patient's personal belongings are at [facility] and she would eventually arrange to pick them up. CSW offered [place] services for patient's family. She reports that she's already spoken to a [place]. Verbal permission granted to coordinate discharge with [facility].   CSW spoke to [facility]- admissions coordinator at [facility]. Per [name] patient was a STR resident. He reports that patient can return if a bed is available due to family not completing a bed hold.   FL2/ PASSR completed and faxed to [facility]. CSW will continue to follow and assist.   Employment status:  Retired Forensic scientist:  Medicare PT Recommendations:  [place] / Referral to community resources:  [place]  Patient/Family's Response to care:  Patient's family is agreeable for patient to return to [facility] when medically stable.   Patient/Family's Understanding of and Emotional Response to Diagnosis, Current Treatment, and Prognosis:  Patient's family understands CSW role and appreciated her assistance.   Emotional Assessment Appearance:  Appears stated age Attitude/Demeanor/Rapport:  Unable to Assess Affect (typically observed):  Unable to Assess Orientation:   (Unable to Assess) Alcohol / Substance use:  Not Applicable Psych involvement (Current and /or in the community):  No (Comment)  Discharge Needs  Concerns  to be addressed:  Discharge Planning Concerns Readmission within the last 30 days:  No Current discharge risk:  Chronically ill Barriers to Discharge:  Continued Medical Work up   [employer], LCSW 07/11/15, 2:54 PM

## 2015-07-03 NOTE — Progress Notes (Signed)
Pt is now resting comfortably.  Pt will withdraw to stimulus.  Pupils are equal and reactive.  Pt has been sinus rhythm, sinus tach on the cardiac monitor.  Lungs are clear/diminished.  Foley remains in place with minimal urine output.  Pt was started on 2 additional pressors during the night.  Also 2 units of prbc's were given during the night.  Pt also had issues with CBG's being low and 2amps of D50 were given. D10 was started to help with blood sugars as well, running at 5ml/hr but decreased to 58ml/hr when serum sodium was drawn.   Also, elink MD ordered 4amps of bicarb pushes.  Blood cultures were drawn off of the PICC line.  [facility] physicians made aware of WBC of .6.  Pt remains on ventilator, sedated with fentanyl.

## 2015-07-03 NOTE — Consult Note (Signed)
[facility] Infectious Disease     Reason for Consult:Sepsis   Referring Physician: Nicholes Mango Date of Admission:  06/26/2015   Active Problems:   Sepsis (Advance)   HPI: [name] is a 70 y.o. 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  Currently intubated, on pressors, family at bedside.   Past Medical History  Diagnosis Date  . Hypertension   . Diabetes mellitus without complication ([place])   . Anemia   . Coronary artery disease   . CHF (congestive heart failure) ([place])   . Glaucoma   . Hyperlipidemia   . Neuropathy ([place])   . Chronic anemia   . Stroke ([place])   . Anginal pain ([place])   . Arthritis   . Retinopathy   . Cardiomyopathy ([place])   . Carotid artery stenosis   . Hoarseness, chronic   . GERD (gastroesophageal reflux disease)   . Hemangioma of liver   . Weight loss, unintentional   . DDD (degenerative disc disease), lumbar   . Osteomyelitis of foot, right, acute ([place])   . Chronic kidney disease     Stage 3   Past Surgical History  Procedure Laterality Date  . Cardiac catheterization    . Esophagogastroduodenoscopy (egd) with propofol N/A 09/24/2014    Elliott-gastritis & normal Billroth II changes   . Colonoscopy with propofol N/A 09/24/2014    Elliott-Incomplete secondary to prep  . Coronary angioplasty  2016    stent placed  . Bilroth ii procedure    . Colonoscopy with propofol N/A 11/12/2014    Procedure: COLONOSCOPY WITH PROPOFOL;  Surgeon: Manya Silvas, MD;  Location: [facility] ENDOSCOPY;  Service: Endoscopy;  Laterality: N/A;  Trudee Kuster hole Right 12/06/2014    Procedure: Right burr hole  for subdural hematoma;  Surgeon: Newman Pies, MD;  Location: [facility] NEURO ORS;  Service: Neurosurgery;  Laterality: Right;  . Irrigation and debridement foot  Right 02/01/2015    Procedure: IRRIGATION AND DEBRIDEMENT FOOT/RIGHT GREAT TOE;  Surgeon: [name], DPM;  Location: [facility] ORS;  Service: Podiatry;  Laterality: Right;  . Eye surgery Right     cataract removed  . Amputation toe Right 03/01/2015    Procedure: AMPUTATION  RIGHT GREAT TOE;  Surgeon: [name], DPM;  Location: [facility] ORS;  Service: Podiatry;  Laterality: Right;  . Irrigation and debridement foot Right 04/03/2015    Procedure: IRRIGATION AND DEBRIDEMENT FOOT;  Surgeon: Sharlotte Alamo, MD;  Location: [facility] ORS;  Service: Podiatry;  Laterality: Right;  . Peripheral vascular catheterization N/A 04/04/2015    Procedure: Abdominal Aortogram w/Lower Extremity;  Surgeon: Algernon Huxley, MD;  Location: [place] CV LAB;  Service: Cardiovascular;  Laterality: N/A;  . Peripheral vascular catheterization  04/04/2015    Procedure: Lower Extremity Intervention;  Surgeon: Algernon Huxley, MD;  Location: [place] CV LAB;  Service: Cardiovascular;;  . Brain surgery    . Transmetatarsal amputation Right 05/28/2015    Procedure: TRANSMETATARSAL AMPUTATION;  Surgeon: Sharlotte Alamo, DPM;  Location: [facility] ORS;  Service: Podiatry;  Laterality: Right;  . Peripheral vascular catheterization N/A 06/06/2015    Procedure: Abdominal Aortogram w/Lower Extremity;  Surgeon: Algernon Huxley, MD;  Location: [place] CV LAB;  Service: Cardiovascular;  Laterality: N/A;  . Peripheral vascular catheterization  06/06/2015    Procedure:  Lower Extremity Intervention;  Surgeon: Algernon Huxley, MD;  Location: [place] CV LAB;  Service: Cardiovascular;;  . Transmetatarsal amputation Right 06/07/2015    Procedure: TRANSMETATARSAL AMPUTATION/ REVISION;  Surgeon: [name], DPM;  Location: [facility] ORS;  Service: Podiatry;  Laterality: Right;  . Application of wound vac Right 06/07/2015    Procedure: APPLICATION OF WOUND VAC;  Surgeon: [name], DPM;  Location: [facility] ORS;  Service: Podiatry;  Laterality: Right;   Social History   Substance Use Topics  . Smoking status: Never Smoker   . Smokeless tobacco: Never Used     Comment: quit many years ago  . Alcohol Use: No   Family History  Problem Relation Age of Onset  . Diabetes Mother   . Heart disease Father   . Diabetes Sister   . Lung cancer Brother   . Cancer Brother   . Diabetes Brother   . Diabetes Sister     Allergies:  Allergies  Allergen Reactions  . Codeine Other (See Comments)    Reaction:  Hallucinations    Current antibiotics: Antibiotics Given (last 72 hours)    Date/Time Action Medication Dose Rate   06/24/2015 1706 Given   vancomycin (VANCOCIN) IVPB 1000 mg/200 mL premix 1,000 mg 200 mL/hr   06/23/2015 1707 Given   piperacillin-tazobactam (ZOSYN) IVPB 3.375 g 3.375 g 12.5 mL/hr   07/02/2015 1849 Given   metroNIDAZOLE (FLAGYL) tablet 500 mg 500 mg    06/22/2015 2216 Given   vancomycin (VANCOCIN) 50 mg/mL oral solution 125 mg 125 mg    07/02/2015 2325 Given   meropenem (MERREM) 500 mg in sodium chloride 0.9 % 50 mL IVPB 500 mg 100 mL/hr   2015-07-20 0030 Given   tobramycin (NEBCIN) 490 mg in dextrose 5 % 100 mL IVPB 490 mg 112.3 mL/hr   07-20-2015 0128 Given   metroNIDAZOLE (FLAGYL) tablet 500 mg 500 mg    Jul 20, 2015 0421 Given   vancomycin (VANCOCIN) 50 mg/mL oral solution 125 mg 125 mg    Jul 20, 2015 0545 Given   metroNIDAZOLE (FLAGYL) tablet 500 mg 500 mg    2015/07/20 0934 Given   meropenem (MERREM) 500 mg in sodium chloride 0.9 % 50 mL IVPB 500 mg 100 mL/hr   07-20-15 1202 Given   vancomycin (VANCOCIN) 50 mg/mL oral solution 125 mg 125 mg       MEDICATIONS: . ferrous sulfate  325 mg Oral QAC supper  . heparin subcutaneous  5,000 Units Subcutaneous 3 times per day  . hydrocortisone sod succinate (SOLU-CORTEF) inj  50 mg Intravenous Q6H  . insulin aspart  0-9 Units Subcutaneous 6 times per day  . meropenem (MERREM) IV  500 mg Intravenous Q12H  . mirtazapine  15 mg Oral QHS  . pantoprazole  40 mg Oral Daily  . sodium bicarbonate      .  [START ON 06/29/2015] tobramycin  7 mg/kg (Adjusted) Intravenous Q36H  . vancomycin  125 mg Oral 4 times per day  . vancomycin  1,000 mg Intravenous Q36H    Review of Systems - 11 systems reviewed and negative per HPI   OBJECTIVE: Temp:  [93.6 F (34.2 C)-101.1 F (38.4 C)] 100 F (37.8 C) (02/24 1405) Pulse Rate:  [35-125] 36 (02/24 1405) Resp:  [0-35] 35 (02/24 1405) BP: (58-112)/(34-70) 88/51 mmHg (02/24 1405) SpO2:  [84 %-100 %] 97 % (02/24 1405) FiO2 (%):  [35 %-60 %] 35 % (02/24 1350) Physical Exam  Constitutional:  Intubated and sedated  HENT: [place]/AT, PERRLA, no  scleral icterus Mouth/Throat: ett in place Cardiovascular: tachycaridac  Pulmonary/Chest:rhonchi Neck = supple, no nuchal rigiditydated Abdominal: Soft. Bowel sounds are normal.  exhibits no distension. There is no tenderness.  Lymphadenopathy: no cervical adenopathy. No axillary adenopathy Neurological sedated Skin: mottling LE, wound wrapped.  Psychiatric: s   LABS: Results for orders placed or performed during the hospital encounter of 07/01/2015 (from the past 48 hour(s))  Comprehensive metabolic panel     Status: Abnormal   Collection Time: 06/26/2015  9:40 AM  Result Value Ref Range   Sodium 143 135 - 145 mmol/L   Potassium 4.8 3.5 - 5.1 mmol/L   Chloride 121 (H) 101 - 111 mmol/L   CO2 16 (L) 22 - 32 mmol/L   Glucose, Bld 66 65 - 99 mg/dL   BUN 62 (H) 6 - 20 mg/dL   Creatinine, Ser 2.68 (H) 0.44 - 1.00 mg/dL   Calcium 8.2 (L) 8.9 - 10.3 mg/dL   Total Protein 4.8 (L) 6.5 - 8.1 g/dL   Albumin 1.6 (L) 3.5 - 5.0 g/dL   AST 19 15 - 41 U/L   ALT 8 (L) 14 - 54 U/L   Alkaline Phosphatase 52 38 - 126 U/L   Total Bilirubin 0.5 0.3 - 1.2 mg/dL   GFR calc non Af Amer 17 (L) >60 mL/min   GFR calc Af Amer 20 (L) >60 mL/min    Comment: (NOTE) The eGFR has been calculated using the CKD EPI equation. This calculation has not been validated in all clinical situations. eGFR's persistently <60 mL/min signify  possible Chronic Kidney Disease.    Anion gap 6 5 - 15  CBC WITH DIFFERENTIAL     Status: Abnormal   Collection Time: 06/05/2015  9:40 AM  Result Value Ref Range   WBC 1.5 (L) 3.6 - 11.0 K/uL   RBC 2.23 (L) 3.80 - 5.20 MIL/uL   Hemoglobin 6.2 (L) 12.0 - 16.0 g/dL   HCT 19.2 (L) 35.0 - 47.0 %   MCV 86.2 80.0 - 100.0 fL   MCH 27.9 26.0 - 34.0 pg   MCHC 32.3 32.0 - 36.0 g/dL   RDW 17.3 (H) 11.5 - 14.5 %   Platelets 101 (L) 150 - 440 K/uL   Neutrophils Relative % 63 %   Neutro Abs 1.0 (L) 1.4 - 6.5 K/uL   Lymphocytes Relative 33 %   Lymphs Abs 0.5 (L) 1.0 - 3.6 K/uL   Monocytes Relative 3 %   Monocytes Absolute 0.1 (L) 0.2 - 0.9 K/uL   Eosinophils Relative 1 %   Eosinophils Absolute 0.0 0 - 0.7 K/uL   Basophils Relative 0 %   Basophils Absolute 0.0 0 - 0.1 K/uL  Lipase, blood     Status: None   Collection Time: 06/21/2015  9:40 AM  Result Value Ref Range   Lipase 16 11 - 51 U/L  Troponin I     Status: Abnormal   Collection Time: 06/22/2015  9:40 AM  Result Value Ref Range   Troponin I 0.08 (H) <0.031 ng/mL    Comment: READ BACK AND VERIFIED WITH GREG MOYER AT 1143 ON 06/16/2015.Marland KitchenMarland KitchenQuincy        PERSISTENTLY INCREASED TROPONIN VALUES IN THE RANGE OF 0.04-0.49 ng/mL CAN BE SEEN IN:       -UNSTABLE ANGINA       -CONGESTIVE HEART FAILURE       -MYOCARDITIS       -CHEST TRAUMA       -ARRYHTHMIAS       -  LATE PRESENTING MYOCARDIAL INFARCTION       -COPD   CLINICAL FOLLOW-UP RECOMMENDED.   Brain natriuretic peptide     Status: Abnormal   Collection Time: 06/10/2015  9:40 AM  Result Value Ref Range   B Natriuretic Peptide 2873.0 (H) 0.0 - 100.0 pg/mL  Blood Culture (routine x 2)     Status: None (Preliminary result)   Collection Time: 07/01/2015  9:41 AM  Result Value Ref Range   Specimen Description BLOOD LEFT ASSIST CONTROL    Special Requests BOTTLES DRAWN AEROBIC AND ANAEROBIC 2CCAERO,2CCANA    Culture  Setup Time GRAM NEGATIVE RODS AEROBIC BOTTLE ONLY     Culture      GRAM  NEGATIVE RODS AEROBIC BOTTLE ONLY IDENTIFICATION AND SUSCEPTIBILITIES TO FOLLOW    Report Status PENDING   Lactic acid, plasma     Status: None   Collection Time: 06/21/2015  9:41 AM  Result Value Ref Range   Lactic Acid, Venous 1.5 0.5 - 2.0 mmol/L  Blood Culture (routine x 2)     Status: None (Preliminary result)   Collection Time: 07/02/2015  9:43 AM  Result Value Ref Range   Specimen Description BLOOD LEFT ASSIST CONTROL    Special Requests BOTTLES DRAWN AEROBIC AND ANAEROBIC 2CCAERO,1CCANA    Culture  Setup Time      GRAM NEGATIVE RODS AEROBIC BOTTLE ONLY CRITICAL RESULT CALLED TO, READ BACK BY AND VERIFIED WITH: MATT MCBANE ON 06/19/2015 AT 2220 [facility]    Culture      ACINETOBACTER BAUMANNII AEROBIC BOTTLE ONLY SUSCEPTIBILITIES TO FOLLOW    Report Status PENDING   Blood Culture ID Panel (Reflexed)     Status: Abnormal   Collection Time: 06/26/2015  9:43 AM  Result Value Ref Range   Enterococcus species NOT DETECTED NOT DETECTED   Vancomycin resistance NOT DETECTED NOT DETECTED   Listeria monocytogenes NOT DETECTED NOT DETECTED   Staphylococcus species NOT DETECTED NOT DETECTED   Staphylococcus aureus NOT DETECTED NOT DETECTED   Methicillin resistance NOT DETECTED NOT DETECTED   Streptococcus species NOT DETECTED NOT DETECTED   Streptococcus agalactiae NOT DETECTED NOT DETECTED   Streptococcus pneumoniae NOT DETECTED NOT DETECTED   Streptococcus pyogenes NOT DETECTED NOT DETECTED   Acinetobacter baumannii DETECTED (A) NOT DETECTED    Comment: CRITICAL RESULT CALLED TO, READ BACK BY AND VERIFIED WITH: MATT MCBANE ON 07/02/2015 AT 2220 [facility]    Enterobacteriaceae species NOT DETECTED NOT DETECTED   Enterobacter cloacae complex NOT DETECTED NOT DETECTED   Escherichia coli NOT DETECTED NOT DETECTED   Klebsiella oxytoca NOT DETECTED NOT DETECTED   Klebsiella pneumoniae NOT DETECTED NOT DETECTED   Proteus species NOT DETECTED NOT DETECTED   Serratia marcescens NOT DETECTED NOT DETECTED    Carbapenem resistance NOT DETECTED NOT DETECTED   Haemophilus influenzae NOT DETECTED NOT DETECTED   Neisseria meningitidis NOT DETECTED NOT DETECTED   Pseudomonas aeruginosa NOT DETECTED NOT DETECTED   Candida albicans NOT DETECTED NOT DETECTED   Candida glabrata NOT DETECTED NOT DETECTED   Candida krusei NOT DETECTED NOT DETECTED   Candida parapsilosis NOT DETECTED NOT DETECTED   Candida tropicalis NOT DETECTED NOT DETECTED  Type and screen [facility]     Status: None (Preliminary result)   Collection Time: 06/21/2015 10:31 AM  Result Value Ref Range   ABO/RH(D) O POS    Antibody Screen NEG    Sample Expiration 06/30/2015    Unit Number [id number]    Blood Component Type RED  CELLS,LR    Unit division 00    Status of Unit ISSUED,FINAL    Transfusion Status OK TO TRANSFUSE    Crossmatch Result Compatible    Unit Number [id number]    Blood Component Type RBC LR PHER2    Unit division 00    Status of Unit ISSUED,FINAL    Transfusion Status OK TO TRANSFUSE    Crossmatch Result Compatible    Unit Number [id number]    Blood Component Type RBC LR PHER2    Unit division 00    Status of Unit ISSUED    Transfusion Status OK TO TRANSFUSE    Crossmatch Result Compatible   Prepare RBC     Status: None   Collection Time: 06/07/2015 10:54 AM  Result Value Ref Range   Order Confirmation ORDER PROCESSED BY BLOOD BANK   Lactic acid, plasma     Status: None   Collection Time: 06/29/2015 12:58 PM  Result Value Ref Range   Lactic Acid, Venous 1.2 0.5 - 2.0 mmol/L  C difficile quick scan w PCR reflex     Status: Abnormal   Collection Time: 06/20/2015  3:00 PM  Result Value Ref Range   C Diff antigen POSITIVE (A) NEGATIVE   C Diff toxin NEGATIVE (A) NEGATIVE   C Diff interpretation      Positive for toxigenic C. difficile, active toxin production not detected. Patient has toxigenic C. difficile organisms present in the bowel, but toxin was not detected. The patient  may be a carrier or the level of toxin in the sample was below the limit  of detection. This information should be used in conjunction with the patient's clinical history when deciding on possible therapy.   Clostridium Difficile by PCR     Status: Abnormal   Collection Time: 06/16/2015  3:00 PM  Result Value Ref Range   Toxigenic C Difficile by pcr POSITIVE (A) NEGATIVE    Comment: CRITICAL RESULT CALLED TO, READ BACK BY AND VERIFIED WITH: RACHEL VERDI AT 1700 ON 06/26/2015 RWW   Glucose, capillary     Status: Abnormal   Collection Time: 06/15/2015  3:48 PM  Result Value Ref Range   Glucose-Capillary 36 (LL) 65 - 99 mg/dL   Comment 1 Notify RN   MRSA PCR Screening     Status: None   Collection Time: 06/23/2015  3:53 PM  Result Value Ref Range   MRSA by PCR NEGATIVE NEGATIVE    Comment:        The GeneXpert MRSA Assay (FDA approved for NASAL specimens only), is one component of a comprehensive MRSA colonization surveillance program. It is not intended to diagnose MRSA infection nor to guide or monitor treatment for MRSA infections.   Glucose, capillary     Status: Abnormal   Collection Time: 06/06/2015  3:57 PM  Result Value Ref Range   Glucose-Capillary 41 (LL) 65 - 99 mg/dL  Glucose, capillary     Status: Abnormal   Collection Time: 06/17/2015  4:41 PM  Result Value Ref Range   Glucose-Capillary 131 (H) 65 - 99 mg/dL  Blood gas, arterial     Status: Abnormal   Collection Time: 06/19/2015  5:00 PM  Result Value Ref Range   FIO2 0.34    Delivery systems NASAL CANNULA    pH, Arterial 7.22 (L) 7.350 - 7.450   pCO2 arterial 32 32.0 - 48.0 mmHg   pO2, Arterial 56 (L) 83.0 - 108.0 mmHg   Bicarbonate 13.1 (L) 21.0 - 28.0  mEq/L   Acid-base deficit 13.4 (H) 0.0 - 2.0 mmol/L   O2 Saturation 81.7 %   Patient temperature 37.0    Collection site RIGHT BRACHIAL    Sample type ARTERIAL DRAW    Allens test (pass/fail) POSITIVE (A) PASS  Hemoglobin     Status: Abnormal   Collection Time:  07/01/2015  5:10 PM  Result Value Ref Range   Hemoglobin 5.7 (L) 12.0 - 16.0 g/dL  Glucose, capillary     Status: Abnormal   Collection Time: 06/23/2015  5:49 PM  Result Value Ref Range   Glucose-Capillary 102 (H) 65 - 99 mg/dL  Blood gas, arterial     Status: Abnormal   Collection Time: 06/10/2015  6:10 PM  Result Value Ref Range   FIO2 0.60    Mode PRESSURE REGULATED VOLUME CONTROL    VT 450 mL   LHR 16 resp/min   Peep/cpap 5.0 cm H20   pH, Arterial 7.13 (LL) 7.350 - 7.450    Comment: CRITICAL RESULT CALLED TO, READ BACK BY AND VERIFIED WITH:  NOTIFIED MD SAINANI AT 1815, 06/19/2015    pCO2 arterial 39 32.0 - 48.0 mmHg   pO2, Arterial 114 (H) 83.0 - 108.0 mmHg   Bicarbonate 13.0 (L) 21.0 - 28.0 mEq/L   Acid-base deficit 14.9 (H) 0.0 - 2.0 mmol/L   O2 Saturation 96.8 %   Patient temperature 37.0    Collection site RIGHT BRACHIAL    Sample type ARTERIAL DRAW    Allens test (pass/fail) POSITIVE (A) PASS  Urine culture     Status: None (Preliminary result)   Collection Time: 06/23/2015  6:45 PM  Result Value Ref Range   Specimen Description URINE, CLEAN CATCH    Special Requests NONE    Culture TOO YOUNG TO READ    Report Status PENDING   Urinalysis complete, with microscopic ([facility] only)     Status: Abnormal   Collection Time: 06/23/2015  6:45 PM  Result Value Ref Range   Color, Urine YELLOW (A) YELLOW   APPearance CLEAR (A) CLEAR   Glucose, UA NEGATIVE NEGATIVE mg/dL   Bilirubin Urine NEGATIVE NEGATIVE   Ketones, ur NEGATIVE NEGATIVE mg/dL   Specific Gravity, Urine 1.015 1.005 - 1.030   Hgb urine dipstick NEGATIVE NEGATIVE   pH 5.0 5.0 - 8.0   Protein, ur NEGATIVE NEGATIVE mg/dL   Nitrite NEGATIVE NEGATIVE   Leukocytes, UA NEGATIVE NEGATIVE   RBC / HPF 0-5 0 - 5 RBC/hpf   WBC, UA 0-5 0 - 5 WBC/hpf   Bacteria, UA RARE (A) NONE SEEN   Squamous Epithelial / LPF 0-5 (A) NONE SEEN   Mucous PRESENT    Hyaline Casts, UA PRESENT   Hemoglobin     Status: Abnormal   Collection Time:  06/26/2015  8:58 PM  Result Value Ref Range   Hemoglobin 6.3 (L) 12.0 - 16.0 g/dL  Culture, blood (Routine X 2) w Reflex to ID Panel     Status: None (Preliminary result)   Collection Time: 06/18/2015  8:58 PM  Result Value Ref Range   Specimen Description BLOOD A-LINE DRAW    Special Requests      BOTTLES DRAWN AEROBIC AND ANAEROBIC 11CCAERO,12CCANA   Culture  Setup Time      GRAM NEGATIVE RODS AEROBIC BOTTLE ONLY CRITICAL VALUE NOTED.  VALUE IS CONSISTENT WITH PREVIOUSLY REPORTED AND CALLED VALUE.    Culture GRAM NEGATIVE RODS IDENTIFICATION TO FOLLOW     Report Status PENDING   Culture, blood (  Routine X 2) w Reflex to ID Panel     Status: None (Preliminary result)   Collection Time: 06/25/2015  9:46 PM  Result Value Ref Range   Specimen Description BLOOD LEFT HAND    Special Requests BOTTLES DRAWN AEROBIC AND ANAEROBIC 7CCAERO,7CCANA    Culture  Setup Time      GRAM NEGATIVE RODS AEROBIC BOTTLE ONLY CRITICAL VALUE NOTED.  VALUE IS CONSISTENT WITH PREVIOUSLY REPORTED AND CALLED VALUE.    Culture GRAM NEGATIVE RODS IDENTIFICATION TO FOLLOW     Report Status PENDING   Prepare RBC     Status: None   Collection Time: 06/11/2015 10:30 PM  Result Value Ref Range   Order Confirmation ORDER PROCESSED BY BLOOD BANK   Glucose, capillary     Status: Abnormal   Collection Time: Jul 27, 2015 12:12 AM  Result Value Ref Range   Glucose-Capillary 53 (L) 65 - 99 mg/dL  Glucose, capillary     Status: None   Collection Time: 07/27/2015  1:01 AM  Result Value Ref Range   Glucose-Capillary 79 65 - 99 mg/dL  Blood gas, arterial     Status: Abnormal   Collection Time: 07/27/2015  3:34 AM  Result Value Ref Range   FIO2 0.60    Delivery systems VENTILATOR    Mode PRESSURE REGULATED VOLUME CONTROL    VT 450 mL   LHR 30 resp/min   Peep/cpap 5.0 cm H20   pH, Arterial 7.11 (LL) 7.350 - 7.450    Comment: CRITICAL RESULT CALLED TO, READ BACK BY AND VERIFIED WITH: DR.DETERDING AT 0345 ON 07/27/2015 KSL    pCO2  arterial 34 32.0 - 48.0 mmHg   pO2, Arterial 65 (L) 83.0 - 108.0 mmHg   Bicarbonate 10.8 (L) 21.0 - 28.0 mEq/L   Acid-base deficit 17.4 (H) 0.0 - 2.0 mmol/L   O2 Saturation 83.1 %   Patient temperature 37.0    Collection site RIGHT RADIAL    Sample type ARTERIAL DRAW    Allens test (pass/fail) POSITIVE (A) PASS   Mechanical Rate 30   Lactic acid, plasma     Status: Abnormal   Collection Time: July 27, 2015  3:49 AM  Result Value Ref Range   Lactic Acid, Venous 2.8 (HH) 0.5 - 2.0 mmol/L    Comment: CRITICAL RESULT CALLED TO, READ BACK BY AND VERIFIED WITH RENE BABB ON 07/27/2015 AT 0559 [facility]   Glucose, capillary     Status: Abnormal   Collection Time: 07/27/15  3:55 AM  Result Value Ref Range   Glucose-Capillary 39 (LL) 65 - 99 mg/dL  CBC     Status: Abnormal   Collection Time: 07-27-15  4:56 AM  Result Value Ref Range   WBC 0.6 (LL) 3.6 - 11.0 K/uL    Comment: CRITICAL RESULT CALLED TO, READ BACK BY AND VERIFIED WITH: RENEE SABB AT 0533 ON 07-27-2015 BY VAB    RBC 3.07 (L) 3.80 - 5.20 MIL/uL   Hemoglobin 8.4 (L) 12.0 - 16.0 g/dL   HCT 26.6 (L) 35.0 - 47.0 %   MCV 86.5 80.0 - 100.0 fL   MCH 27.4 26.0 - 34.0 pg   MCHC 31.6 (L) 32.0 - 36.0 g/dL   RDW 18.7 (H) 11.5 - 14.5 %   Platelets 63 (L) 150 - 440 K/uL  Comprehensive metabolic panel     Status: Abnormal   Collection Time: 2015/07/27  5:00 AM  Result Value Ref Range   Sodium 138 135 - 145 mmol/L   Potassium 4.0 3.5 -  5.1 mmol/L   Chloride 118 (H) 101 - 111 mmol/L   CO2 10 (L) 22 - 32 mmol/L   Glucose, Bld 223 (H) 65 - 99 mg/dL   BUN 53 (H) 6 - 20 mg/dL   Creatinine, Ser 2.51 (H) 0.44 - 1.00 mg/dL   Calcium 6.9 (L) 8.9 - 10.3 mg/dL   Total Protein 3.8 (L) 6.5 - 8.1 g/dL   Albumin 1.1 (L) 3.5 - 5.0 g/dL   AST 29 15 - 41 U/L   ALT 8 (L) 14 - 54 U/L   Alkaline Phosphatase 43 38 - 126 U/L   Total Bilirubin 0.5 0.3 - 1.2 mg/dL   GFR calc non Af Amer 18 (L) >60 mL/min   GFR calc Af Amer 21 (L) >60 mL/min    Comment: (NOTE) The eGFR  has been calculated using the CKD EPI equation. This calculation has not been validated in all clinical situations. eGFR's persistently <60 mL/min signify possible Chronic Kidney Disease.    Anion gap 10 5 - 15  Glucose, capillary     Status: Abnormal   Collection Time: 2015/07/14  7:55 AM  Result Value Ref Range   Glucose-Capillary <10 (LL) 65 - 99 mg/dL  Glucose, capillary     Status: Abnormal   Collection Time: 2015/07/14  7:59 AM  Result Value Ref Range   Glucose-Capillary <10 (LL) 65 - 99 mg/dL  Blood gas, arterial     Status: Abnormal   Collection Time: 07/14/2015  8:11 AM  Result Value Ref Range   FIO2 0.60    Delivery systems VENTILATOR    Mode PRESSURE REGULATED VOLUME CONTROL    VT 450 mL   LHR 35 resp/min   Peep/cpap 5.0 cm H20   pH, Arterial 7.06 (LL) 7.350 - 7.450    Comment: CRITICAL RESULT CALLED TO, READ BACK BY AND VERIFIED WITH: BRITTNEY RUDD, RN AT 0825 ON 07-14-15    pCO2 arterial 29 (L) 32.0 - 48.0 mmHg   pO2, Arterial 55 (L) 83.0 - 108.0 mmHg   Bicarbonate 8.2 (L) 21.0 - 28.0 mEq/L    Comment: CRITICAL RESULT CALLED TO, READ BACK BY AND VERIFIED WITH: BRITTNEY RUDD, RN AT 0825 ON July 14, 2015    Acid-base deficit 20.6 (H) 0.0 - 2.0 mmol/L   O2 Saturation 71.5 %   Patient temperature 37.0    Collection site RIGHT RADIAL    Sample type ARTERIAL DRAW    Allens test (pass/fail) POSITIVE (A) PASS  Glucose, capillary     Status: None   Collection Time: Jul 14, 2015  8:23 AM  Result Value Ref Range   Glucose-Capillary 84 65 - 99 mg/dL  Glucose, capillary     Status: Abnormal   Collection Time: July 14, 2015  9:02 AM  Result Value Ref Range   Glucose-Capillary 155 (H) 65 - 99 mg/dL  Glucose, capillary     Status: Abnormal   Collection Time: Jul 14, 2015  9:21 AM  Result Value Ref Range   Glucose-Capillary 40 (LL) 65 - 99 mg/dL  Glucose, capillary     Status: Abnormal   Collection Time: 14-Jul-2015  9:23 AM  Result Value Ref Range   Glucose-Capillary 47 (L) 65 - 99 mg/dL   Lactic acid, plasma     Status: Abnormal   Collection Time: July 14, 2015  9:44 AM  Result Value Ref Range   Lactic Acid, Venous 4.8 (HH) 0.5 - 2.0 mmol/L    Comment: CRITICAL RESULT CALLED TO, READ BACK BY AND VERIFIED WITH STACY COLLEY 14-Jul-2015 1037 [facility]  Tobramycin level, random     Status: None   Collection Time: Jul 02, 2015  9:45 AM  Result Value Ref Range   Tobramycin Rm 7.0 ug/mL    Comment:        Random Tobramycin therapeutic range is dependent on dosage and time of specimen collection. A peak range is 5.0-10.0 ug/mL A trough range is 0.5-2.0 ug/mL          Glucose, capillary     Status: None   Collection Time: 2015/07/02 10:26 AM  Result Value Ref Range   Glucose-Capillary 96 65 - 99 mg/dL   No components found for: ESR, C REACTIVE PROTEIN MICRO: Recent Results (from the past 720 hour(s))  Wound culture     Status: None   Collection Time: 06/04/15  8:36 AM  Result Value Ref Range Status   Specimen Description FOOT  Final   Special Requests NONE  Final   Gram Stain   Final    RARE WBC SEEN FEW RED BLOOD CELLS NO ORGANISMS SEEN    Culture   Final    LIGHT GROWTH ENTEROCOCCUS FAECIUM RARE GROWTH METHICILLIN RESISTANT STAPHYLOCOCCUS AUREUS    Report Status 06/10/2015 FINAL  Final   Organism ID, Bacteria ENTEROCOCCUS FAECIUM  Final   Organism ID, Bacteria METHICILLIN RESISTANT STAPHYLOCOCCUS AUREUS  Final      Susceptibility   Enterococcus faecium - MIC*    AMPICILLIN >=32 RESISTANT Resistant     LINEZOLID 2 SENSITIVE Sensitive     * LIGHT GROWTH ENTEROCOCCUS FAECIUM   Methicillin resistant staphylococcus aureus - MIC*    CIPROFLOXACIN >=8 RESISTANT Resistant     GENTAMICIN <=0.5 SENSITIVE Sensitive     OXACILLIN >=4 RESISTANT Resistant     TETRACYCLINE <=1 SENSITIVE Sensitive     VANCOMYCIN 1 SENSITIVE Sensitive     TRIMETH/SULFA <=10 SENSITIVE Sensitive     RIFAMPIN <=0.5 SENSITIVE Sensitive     Inducible Clindamycin NEGATIVE Sensitive     * RARE GROWTH  METHICILLIN RESISTANT STAPHYLOCOCCUS AUREUS  Anaerobic culture     Status: None   Collection Time: 06/04/15  8:36 AM  Result Value Ref Range Status   Specimen Description FOOT  Final   Special Requests NONE  Final   Culture NO ANAEROBES ISOLATED  Final   Report Status 06/07/2015 FINAL  Final  Surgical pcr screen     Status: None   Collection Time: 06/04/15 10:12 AM  Result Value Ref Range Status   MRSA, PCR NEGATIVE NEGATIVE Final   Staphylococcus aureus NEGATIVE NEGATIVE Final    Comment:        The Xpert SA Assay (FDA approved for NASAL specimens in patients over 86 years of age), is one component of a comprehensive surveillance program.  Test performance has been validated by [facility] for patients greater than or equal to 10 year old. It is not intended to diagnose infection nor to guide or monitor treatment.   Wound culture     Status: None   Collection Time: 06/07/15  3:00 PM  Result Value Ref Range Status   Specimen Description WOUND  Final   Special Requests NONE  Final   Gram Stain RARE WBC SEEN RARE GRAM POSITIVE COCCI   Final   Culture RARE GROWTH ENTEROCOCCUS FAECIUM  Final   Report Status 06/11/2015 FINAL  Final   Organism ID, Bacteria ENTEROCOCCUS FAECIUM  Final      Susceptibility   Enterococcus faecium - MIC*    AMPICILLIN >=32 RESISTANT Resistant  VANCOMYCIN <=0.5 SENSITIVE Sensitive     GENTAMICIN SYNERGY SENSITIVE Sensitive     LINEZOLID 2 SENSITIVE Sensitive     * RARE GROWTH ENTEROCOCCUS FAECIUM  Blood Culture (routine x 2)     Status: None (Preliminary result)   Collection Time: 06/07/2015  9:41 AM  Result Value Ref Range Status   Specimen Description BLOOD LEFT ASSIST CONTROL  Final   Special Requests BOTTLES DRAWN AEROBIC AND ANAEROBIC 2CCAERO,2CCANA  Final   Culture  Setup Time GRAM NEGATIVE RODS AEROBIC BOTTLE ONLY   Final   Culture   Final    GRAM NEGATIVE RODS AEROBIC BOTTLE ONLY IDENTIFICATION AND SUSCEPTIBILITIES TO FOLLOW     Report Status PENDING  Incomplete  Blood Culture (routine x 2)     Status: None (Preliminary result)   Collection Time: 06/22/2015  9:43 AM  Result Value Ref Range Status   Specimen Description BLOOD LEFT ASSIST CONTROL  Final   Special Requests BOTTLES DRAWN AEROBIC AND ANAEROBIC 2CCAERO,1CCANA  Final   Culture  Setup Time   Final    GRAM NEGATIVE RODS AEROBIC BOTTLE ONLY CRITICAL RESULT CALLED TO, READ BACK BY AND VERIFIED WITH: MATT MCBANE ON 06/05/2015 AT 2220 [name]    Culture   Final    ACINETOBACTER BAUMANNII AEROBIC BOTTLE ONLY SUSCEPTIBILITIES TO FOLLOW    Report Status PENDING  Incomplete  Blood Culture ID Panel (Reflexed)     Status: Abnormal   Collection Time: 06/20/2015  9:43 AM  Result Value Ref Range Status   Enterococcus species NOT DETECTED NOT DETECTED Final   Vancomycin resistance NOT DETECTED NOT DETECTED Final   Listeria monocytogenes NOT DETECTED NOT DETECTED Final   Staphylococcus species NOT DETECTED NOT DETECTED Final   Staphylococcus aureus NOT DETECTED NOT DETECTED Final   Methicillin resistance NOT DETECTED NOT DETECTED Final   Streptococcus species NOT DETECTED NOT DETECTED Final   Streptococcus agalactiae NOT DETECTED NOT DETECTED Final   Streptococcus pneumoniae NOT DETECTED NOT DETECTED Final   Streptococcus pyogenes NOT DETECTED NOT DETECTED Final   Acinetobacter baumannii DETECTED (A) NOT DETECTED Final    Comment: CRITICAL RESULT CALLED TO, READ BACK BY AND VERIFIED WITH: MATT MCBANE ON 06/05/2015 AT 2220 [facility]    Enterobacteriaceae species NOT DETECTED NOT DETECTED Final   Enterobacter cloacae complex NOT DETECTED NOT DETECTED Final   Escherichia coli NOT DETECTED NOT DETECTED Final   Klebsiella oxytoca NOT DETECTED NOT DETECTED Final   Klebsiella pneumoniae NOT DETECTED NOT DETECTED Final   Proteus species NOT DETECTED NOT DETECTED Final   Serratia marcescens NOT DETECTED NOT DETECTED Final   Carbapenem resistance NOT DETECTED NOT DETECTED Final    Haemophilus influenzae NOT DETECTED NOT DETECTED Final   Neisseria meningitidis NOT DETECTED NOT DETECTED Final   Pseudomonas aeruginosa NOT DETECTED NOT DETECTED Final   Candida albicans NOT DETECTED NOT DETECTED Final   Candida glabrata NOT DETECTED NOT DETECTED Final   Candida krusei NOT DETECTED NOT DETECTED Final   Candida parapsilosis NOT DETECTED NOT DETECTED Final   Candida tropicalis NOT DETECTED NOT DETECTED Final  C difficile quick scan w PCR reflex     Status: Abnormal   Collection Time: 06/30/2015  3:00 PM  Result Value Ref Range Status   C Diff antigen POSITIVE (A) NEGATIVE Final   C Diff toxin NEGATIVE (A) NEGATIVE Final   C Diff interpretation   Final    Positive for toxigenic C. difficile, active toxin production not detected. Patient has toxigenic C. difficile organisms  present in the bowel, but toxin was not detected. The patient may be a carrier or the level of toxin in the sample was below the limit  of detection. This information should be used in conjunction with the patient's clinical history when deciding on possible therapy.   Clostridium Difficile by PCR     Status: Abnormal   Collection Time: 06/08/2015  3:00 PM  Result Value Ref Range Status   Toxigenic C Difficile by pcr POSITIVE (A) NEGATIVE Final    Comment: CRITICAL RESULT CALLED TO, READ BACK BY AND VERIFIED WITH: RACHEL VERDI AT 1700 ON 06/11/2015 RWW   MRSA PCR Screening     Status: None   Collection Time: 06/26/2015  3:53 PM  Result Value Ref Range Status   MRSA by PCR NEGATIVE NEGATIVE Final    Comment:        The GeneXpert MRSA Assay (FDA approved for NASAL specimens only), is one component of a comprehensive MRSA colonization surveillance program. It is not intended to diagnose MRSA infection nor to guide or monitor treatment for MRSA infections.   Urine culture     Status: None (Preliminary result)   Collection Time: 06/29/2015  6:45 PM  Result Value Ref Range Status   Specimen Description  URINE, CLEAN CATCH  Final   Special Requests NONE  Final   Culture TOO YOUNG TO READ  Final   Report Status PENDING  Incomplete  Culture, blood (Routine X 2) w Reflex to ID Panel     Status: None (Preliminary result)   Collection Time: 06/23/2015  8:58 PM  Result Value Ref Range Status   Specimen Description BLOOD A-LINE DRAW  Final   Special Requests   Final    BOTTLES DRAWN AEROBIC AND ANAEROBIC 11CCAERO,12CCANA   Culture  Setup Time   Final    GRAM NEGATIVE RODS AEROBIC BOTTLE ONLY CRITICAL VALUE NOTED.  VALUE IS CONSISTENT WITH PREVIOUSLY REPORTED AND CALLED VALUE.    Culture GRAM NEGATIVE RODS IDENTIFICATION TO FOLLOW   Final   Report Status PENDING  Incomplete  Culture, blood (Routine X 2) w Reflex to ID Panel     Status: None (Preliminary result)   Collection Time: 06/21/2015  9:46 PM  Result Value Ref Range Status   Specimen Description BLOOD LEFT HAND  Final   Special Requests BOTTLES DRAWN AEROBIC AND ANAEROBIC 7CCAERO,7CCANA  Final   Culture  Setup Time   Final    GRAM NEGATIVE RODS AEROBIC BOTTLE ONLY CRITICAL VALUE NOTED.  VALUE IS CONSISTENT WITH PREVIOUSLY REPORTED AND CALLED VALUE.    Culture GRAM NEGATIVE RODS IDENTIFICATION TO FOLLOW   Final   Report Status PENDING  Incomplete    IMAGING: Dg Chest 1 View  10-Jul-2015  CLINICAL DATA:  Shortness of breath. EXAM: CHEST 1 VIEW COMPARISON:  06/12/2015. FINDINGS: Endotracheal tube, NG tube, right PICC line in stable position. Cardiomegaly with mild pulmonary vascular prominence interstitial prominence again noted. Small bilateral pleural effusions. Findings consistent mild congestive heart failure. Low lung volumes with bibasilar atelectasis and/or infiltrates. IMPRESSION: 1. Lines and tubes in stable position. 2. Persistent changes of congestive heart failure mild interstitial edema and bilateral effusions. 3. Low lung volumes with bibasilar atelectasis and/or infiltrates. These changes are progressive from prior exam .  Electronically Signed   By: [place]   On: 10-Jul-2015 07:09   Dg Chest 1 View  06/08/2015  CLINICAL DATA:  Status post intubation. EXAM: CHEST 1 VIEW COMPARISON:  Earlier the same day FINDINGS:  1808 hours. Endotracheal tube tip is 3.2 cm above the base of the carina. The NG tube passes into the stomach although the distal tip position is not included on the film. Right PICC line tip overlies the mid SVC level. Patient is rotated to the right. The cardio pericardial silhouette is enlarged. There is pulmonary vascular congestion without overt pulmonary edema. Interstitial pulmonary edema is suspected. Small bilateral pleural effusions are noted. Telemetry leads overlie the chest. IMPRESSION: Endotracheal tube tip is 3.2 cm above the base of the carina. Cardiomegaly with vascular congestion and interstitial pulmonary edema. Small bilateral pleural effusions. Electronically Signed   By: Misty Stanley M.D.   On: 06/26/2015 18:22   Dg Abd 1 View  07/19/2015  CLINICAL DATA:  Diarrhea EXAM: ABDOMEN - 1 VIEW COMPARISON:  06/30/2015 FINDINGS: There is normal small bowel gas pattern. Moderate gaseous distension of the stomach. NG tube in place with tip in body of the stomach unchanged in position. Bilateral small pleural effusion right greater than left. IMPRESSION: Normal small bowel gas pattern. Moderate gaseous distension of the stomach. Stable NG tube position with tip in body of the stomach. Electronically Signed   By: Lahoma Crocker M.D.   On: July 19, 2015 10:15   Dg Abd 1 View  06/24/2015  CLINICAL DATA:  Enteric tube placement. EXAM: ABDOMEN - 1 VIEW COMPARISON:  10/06/2014 abdominal radiograph. FINDINGS: Enteric tube terminates in the body of the stomach. Enteric tube side port is intragastric. Mild gaseous distention of the stomach. No appreciable dilated small bowel loops. No evidence of pneumatosis or pneumoperitoneum. Moderate degenerative changes in the visualized thoracolumbar spine. Vascular  calcifications throughout the soft tissues. IMPRESSION: Enteric tube terminates in the body of the stomach. Electronically Signed   By: Ilona Sorrel M.D.   On: 06/10/2015 18:23   Dg Chest Port 1 View  06/08/2015  CLINICAL DATA:  Sepsis.  Code sepsis. EXAM: PORTABLE CHEST 1 VIEW COMPARISON:  05/29/2015 FINDINGS: PICC line tip in the lower SVC region. Patchy densities in the lower chest bilaterally. Heart size is enlarged. Negative for pneumothorax. IMPRESSION: Slightly increased densities in the lower chest bilaterally. Findings probably represent a combination of atelectasis and pleural fluid. Electronically Signed   By: Markus Daft M.D.   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 Small bone fragments at the osteotomy /disarticulation may be postoperative. No gross bony erosion. There is extensive soft tissue gas in the overlying soft tissue flap, which likely reaches the cuboid. Negative for acute fracture. 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 When allowing for postoperative bone fragments there is no convincing osseous erosion. Electronically Signed   By: Monte Fantasia M.D.   On: 06/13/2015 10:21    Assessment:   GWYN HIERONYMUS is a 70 y.o. 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 Bcx with acinetobacter, C dif f+ She is severely ill and septic. Discussed poor prognosis with family.  For now continue meropenem and tobramycin given the acinetobacter and continue vanco for prior MRSA foot infection.  For C diff continue oral vanco  Further recs based on the culture results.   Thank you very much for allowing  me to participate in  the care of this patient. Please call with questions.   Cheral Marker. Ola Spurr, MD

## 2015-07-03 NOTE — Progress Notes (Signed)
[facility] Progress Note Patient Name: MENUCHA BOLZ DOB: [birth year] MRN: [id number]   Date of Service  07-06-15  HPI/Events of Note  Persistent hypotension despite NE and Vasopressin gtts.  BNP is greater than 2500, vascular congestion of CXR and h/o of CHF.  Also with hypoglycemia requiring D10 infusion.  Current blood sugar of 53.  eICU Interventions  Plan: Increase NE gtt to max at 50 mcg If BP remains low then NEO gtt for BP support Aline to verify BP - patient is making urine despite MAP of 58 Increase D10 infusion to 30 cc/hr     Intervention Category Major Interventions: Shock - evaluation and management;Sepsis - evaluation and management;Other:  Megumi Treaster 06-Jul-2015, 12:40 AM

## 2015-07-03 NOTE — Consult Note (Signed)
[facility] Consult Note  MRN : [id number]  ALEXX GIAMBRA is a 70 y.o. ([birth year]) female who presents with chief complaint of  Chief Complaint  Patient presents with  . Code Sepsis  .  History of Present Illness:  Consulted by primary team (Dr. Verdell Carmine) for possible vascular intervention (angiogram vs amputation).   **Patient is sedated and on vent. HPI gathered from previous notes**  Dajane Valli is a 70 y.o. female with a known history of diabetes type 2 without complication, chronic anemia, chronic kidney disease stage III, hypertension, glaucoma, diabetic neuropathy, history of previous CVA, history of carotid artery stenosis, GERD, degenerative disc disease, osteomyelitis who presents to the hospital due to altered mental status and noted to be hypotensive tachycardic and suspected to have sepsis. Patient was recently hospitalized and treated for osteomyelitis of the right foot and underwent transmetatarsal amputations. She was discharged to a skilled nursing facility on IV Zosyn, oral Bactrim and recently IV vancomycin was also added. Despite aggressive IV antibiotic therapy patient presented to the hospital with hypotension, tachycardia and noted to be septic.   Patient was seen by vascular surgery in consult during her last hospitalization at [facility] (06/05/15) for evaluation of her nonhealing right transmetatarsal amputation. At the time, the patient had undergone previous intervention and was noted to have one-vessel runoff distally. Dr. Lucky Cowboy recommended BKA due to the likelihood of healing in the foot being reasonably low. Patient refused, and another angiogram (06/06/15) was completed which was notable for two vessel run off.   Current Facility-Administered Medications  Medication Dose Route Frequency Provider Last Rate Last Dose  . acetaminophen (TYLENOL) tablet 650 mg  650 mg Oral Q6H PRN Henreitta Leber, MD       Or  . acetaminophen (TYLENOL)  suppository 650 mg  650 mg Rectal Q6H PRN Henreitta Leber, MD      . dextrose 10 % infusion   Intravenous Continuous Laverle Hobby, MD 50 mL/hr at 2015-07-06 0817    . fentaNYL 2569mg in NS 2520m(1058mml) infusion-PREMIX  10 mcg/hr Intravenous Continuous VivHenreitta LeberD 2.5 mL/hr at 06/06/02/201742 25 mcg/hr at 02/03-08-1740  . ferrous sulfate tablet 325 mg  325 mg Oral QAC supper VivHenreitta LeberD   325 mg at 06/06/02/201728  . heparin injection 5,000 Units  5,000 Units Subcutaneous 3 times per day PraLaverle HobbyD   5,000 Units at 02/March 04, 201745  . insulin aspart (novoLOG) injection 0-9 Units  0-9 Units Subcutaneous 6 times per day EliColbert CoyerD   0 Units at 02/Mar 04, 201722  . meropenem (MERREM) 500 mg in sodium chloride 0.9 % 50 mL IVPB  500 mg Intravenous Q12H Srikar Sudini, MD   500 mg at 06/06/02/1734  . mirtazapine (REMERON) tablet 15 mg  15 mg Oral QHS VivHenreitta LeberD   15 mg at 06/21/2015 2215  . norepinephrine (LEVOPHED) 16 mg in dextrose 5 % 250 mL (0.064 mg/mL) infusion  0-40 mcg/min Intravenous Titrated VivHenreitta LeberD 37.5 mL/hr at 06/06/02/201731 40 mcg/min at 06/06/02/201731  . norepinephrine (LEVOPHED) 4 mg in dextrose 5 % 250 mL (0.016 mg/mL) infusion  0-40 mcg/min Intravenous Titrated VivHenreitta LeberD 112.5 mL/hr at 06/17/2015 1735 30 mcg/min at 06/29/2015 1735  . ondansetron (ZOFRAN) tablet 4 mg  4 mg Oral Q6H PRN VivHenreitta LeberD       Or  . ondansetron (ZOClinch Memorial Hospitalnjection 4 mg  4 mg Intravenous Q6H PRN Henreitta Leber, MD      . pantoprazole (PROTONIX) EC tablet 40 mg  40 mg Oral Daily Henreitta Leber, MD   40 mg at 07/27/2015 0934  . phenylephrine (NEO-SYNEPHRINE) 10 mg in dextrose 5 % 250 mL (0.04 mg/mL) infusion  30-200 mcg/min Intravenous Continuous Colbert Coyer, MD 135 mL/hr at July 27, 2015 1037 90 mcg/min at 27-Jul-2015 1037  . sodium bicarbonate 1 mEq/mL injection           . sodium bicarbonate 150 mEq in dextrose 5 % 1,000 mL infusion    Intravenous Continuous Laverle Hobby, MD 150 mL/hr at 2015/07/27 0918    . [START ON 06/29/2015] tobramycin (NEBCIN) 490 mg in dextrose 5 % 100 mL IVPB  7 mg/kg (Adjusted) Intravenous Q36H Fritzi Mandes, MD      . vancomycin (VANCOCIN) 50 mg/mL oral solution 125 mg  125 mg Oral 4 times per day Laverle Hobby, MD   125 mg at 07-27-2015 0421  . vancomycin (VANCOCIN) IVPB 1000 mg/200 mL premix  1,000 mg Intravenous Q36H Henreitta Leber, MD   1,000 mg at 06/30/2015 1706  . vasopressin (PITRESSIN) 40 Units in sodium chloride 0.9 % 250 mL (0.16 Units/mL) infusion  0.04 Units/min Intravenous Continuous Laverle Hobby, MD 15 mL/hr at 07-27-2015 0813 0.04 Units/min at Jul 27, 2015 0813    Past Medical History  Diagnosis Date  . Hypertension   . Diabetes mellitus without complication ([place])   . Anemia   . Coronary artery disease   . CHF (congestive heart failure) ([place])   . Glaucoma   . Hyperlipidemia   . Neuropathy ([place])   . Chronic anemia   . Stroke ([place])   . Anginal pain ([place])   . Arthritis   . Retinopathy   . Cardiomyopathy ([place])   . Carotid artery stenosis   . Hoarseness, chronic   . GERD (gastroesophageal reflux disease)   . Hemangioma of liver   . Weight loss, unintentional   . DDD (degenerative disc disease), lumbar   . Osteomyelitis of foot, right, acute ([place])   . Chronic kidney disease     Stage 3    Past Surgical History  Procedure Laterality Date  . Cardiac catheterization    . Esophagogastroduodenoscopy (egd) with propofol N/A 09/24/2014    Elliott-gastritis & normal Billroth II changes   . Colonoscopy with propofol N/A 09/24/2014    Elliott-Incomplete secondary to prep  . Coronary angioplasty  2016    stent placed  . Bilroth ii procedure    . Colonoscopy with propofol N/A 11/12/2014    Procedure: COLONOSCOPY WITH PROPOFOL;  Surgeon: Manya Silvas, MD;  Location: [facility] ENDOSCOPY;  Service: Endoscopy;  Laterality: N/A;  Trudee Kuster hole Right 12/06/2014    Procedure: Right  burr hole  for subdural hematoma;  Surgeon: Newman Pies, MD;  Location: [facility] NEURO ORS;  Service: Neurosurgery;  Laterality: Right;  . Irrigation and debridement foot Right 02/01/2015    Procedure: IRRIGATION AND DEBRIDEMENT FOOT/RIGHT GREAT TOE;  Surgeon: [name], DPM;  Location: [facility] ORS;  Service: Podiatry;  Laterality: Right;  . Eye surgery Right     cataract removed  . Amputation toe Right 03/01/2015    Procedure: AMPUTATION  RIGHT GREAT TOE;  Surgeon: [name], DPM;  Location: [facility] ORS;  Service: Podiatry;  Laterality: Right;  . Irrigation and debridement foot Right 04/03/2015    Procedure: IRRIGATION AND DEBRIDEMENT FOOT;  Surgeon: Sharlotte Alamo, MD;  Location: [facility] ORS;  Service: Podiatry;  Laterality: Right;  . Peripheral vascular catheterization N/A 04/04/2015    Procedure: Abdominal Aortogram w/Lower Extremity;  Surgeon: Algernon Huxley, MD;  Location: [place] CV LAB;  Service: Cardiovascular;  Laterality: N/A;  . Peripheral vascular catheterization  04/04/2015    Procedure: Lower Extremity Intervention;  Surgeon: Algernon Huxley, MD;  Location: [place] CV LAB;  Service: Cardiovascular;;  . Brain surgery    . Transmetatarsal amputation Right 05/28/2015    Procedure: TRANSMETATARSAL AMPUTATION;  Surgeon: Sharlotte Alamo, DPM;  Location: [facility] ORS;  Service: Podiatry;  Laterality: Right;  . Peripheral vascular catheterization N/A 06/06/2015    Procedure: Abdominal Aortogram w/Lower Extremity;  Surgeon: Algernon Huxley, MD;  Location: [place] CV LAB;  Service: Cardiovascular;  Laterality: N/A;  . Peripheral vascular catheterization  06/06/2015    Procedure: Lower Extremity Intervention;  Surgeon: Algernon Huxley, MD;  Location: [place] CV LAB;  Service: Cardiovascular;;  . Transmetatarsal amputation Right 06/07/2015    Procedure: TRANSMETATARSAL AMPUTATION/ REVISION;  Surgeon: [name], DPM;  Location: [facility] ORS;  Service: Podiatry;  Laterality: Right;  . Application of wound vac  Right 06/07/2015    Procedure: APPLICATION OF WOUND VAC;  Surgeon: [name], DPM;  Location: [facility] ORS;  Service: Podiatry;  Laterality: Right;    Social History Social History  Substance Use Topics  . Smoking status: Never Smoker   . Smokeless tobacco: Never Used     Comment: quit many years ago  . Alcohol Use: No    Family History Family History  Problem Relation Age of Onset  . Diabetes Mother   . Heart disease Father   . Diabetes Sister   . Lung cancer Brother   . Cancer Brother   . Diabetes Brother   . Diabetes Sister   Denies family history of porphyria, PAD or renal disease.  Allergies  Allergen Reactions  . Codeine Other (See Comments)    Reaction:  Hallucinations    REVIEW OF SYSTEMS (Negative unless checked)  Unable to obtain review of systems as patient is sedating and on vent  Constitutional: [] Weight loss  [] Fever  [] Chills Cardiac: [] Chest pain   [] Chest pressure   [] Palpitations   [] Shortness of breath when laying flat   [] Shortness of breath at rest   [] Shortness of breath with exertion. Vascular:  [] Pain in legs with walking   [] Pain in legs at rest   [] Pain in legs when laying flat   [] Claudication   [] Pain in feet when walking  [] Pain in feet at rest  [] Pain in feet when laying flat   [] History of DVT   [] Phlebitis   [] Swelling in legs   [] Varicose veins   [] Non-healing ulcers Pulmonary:   [] Uses home oxygen   [] Productive cough   [] Hemoptysis   [] Wheeze  [] COPD   [] Asthma Neurologic:  [] Dizziness  [] Blackouts   [] Seizures   [] History of stroke   [] History of TIA  [] Aphasia   [] Temporary blindness   [] Dysphagia   [] Weakness or numbness in arms   [] Weakness or numbness in legs Musculoskeletal:  [] Arthritis   [] Joint swelling   [] Joint pain   [] Low back pain Hematologic:  [] Easy bruising  [] Easy bleeding   [] Hypercoagulable state   [] Anemic  [] Hepatitis Gastrointestinal:  [] Blood in stool   [] Vomiting blood  [] Gastroesophageal reflux/heartburn    [] Difficulty swallowing. Genitourinary:  [] Chronic kidney disease   [] Difficult urination  [] Frequent urination  [] Burning with urination   [] Blood in urine Skin:  []   Rashes   [] Ulcers   [] Wounds Psychological:  [] History of anxiety   []  History of major depression.  Physical Examination  Filed Vitals:   Jul 02, 2015 1020 07-02-15 1025 07/02/2015 1030 2015/07/02 1035  BP: 93/49 91/47 82/48  82/51  Pulse:   116   Temp: 100.8 F (38.2 C) 100.6 F (38.1 C) 100.6 F (38.1 C) 100.6 F (38.1 C)  TempSrc:      Resp: 35 31 35 31  Height:      Weight:      SpO2:  100% 100%    Body mass index is 28.97 kg/(m^2). Gen:  On vent, sedated Head: [place]/AT, No temporalis wasting. Prominent temp pulse not noted. Ear/Nose/Throat: Nares w/o erythema or drainage, oropharynx w/o Erythema/Exudate Eyes: pupils reactive.  Neck: Supple, no nuchal rigidity.  No  JVD.  Pulmonary:  Good air movement, clear to auscultation bilaterally.  Cardiac: RRR, normal S1, S2, no Murmurs, rubs or gallops. Vascular:  Vessel Right Left  Radial Palpable Palpable  Ulnar Palpable Palpable  Brachial Palpable Palpable  Carotid Palpable, without bruit Palpable, without bruit  Aorta Not palpable N/A  Femoral Palpable Palpable  Popliteal Palpable Palpable  PT Non-Palpable Palpable  DP Non-Palpable Palpable   Right Lower Extremity: s/p right trans-met amputation. Necrosis to lateral skin flap with dehiscence. No severe purulence. No foul odor.  Gastrointestinal: soft, non-tender/non-distended. No guarding/reflex.  Musculoskeletal: Extremities edematous Neurologic: Sedated on vent unable to assess Psychiatric:Sedated on vent unable to assess. Dermatologic: See above Lymph : No Cervical, Axillary, or Inguinal lymphadenopathy.  CBC Lab Results  Component Value Date   WBC 0.6* 07-02-15   HGB 8.4* 07-02-15   HCT 26.6* 07-02-2015   MCV 86.5 Jul 02, 2015   PLT 63* 2015/07/02   BMET    Component Value Date/Time   NA 138  Jul 02, 2015 0500   NA 146* 05/05/2014 0108   K 4.0 July 02, 2015 0500   K 3.4* 05/05/2014 0108   CL 118* 07/02/2015 0500   CL 118* 05/05/2014 0108   CO2 10* Jul 02, 2015 0500   CO2 21 05/05/2014 0108   GLUCOSE 223* 07/02/15 0500   GLUCOSE 52* 05/05/2014 0108   BUN 53* 07/02/2015 0500   BUN 26* 05/05/2014 0108   CREATININE 2.51* 2015-07-02 0500   CREATININE 2.02* 05/05/2014 0108   CALCIUM 6.9* 02-Jul-2015 0500   CALCIUM 7.7* 05/05/2014 0108   GFRNONAA 18* Jul 02, 2015 0500   GFRNONAA 26* 05/05/2014 0108   GFRNONAA 28* 05/22/2013 0518   GFRAA 21* 07-02-2015 0500   GFRAA 31* 05/05/2014 0108   GFRAA 33* 05/22/2013 0518   Estimated Creatinine Clearance: 23.2 mL/min (by C-G formula based on Cr of 2.51).  COAG Lab Results  Component Value Date   INR 1.18 05/27/2015   INR 1.33 04/02/2015   INR 1.27 12/03/2014   Radiology Dg Chest 1 View  07/02/15  CLINICAL DATA:  Shortness of breath. EXAM: CHEST 1 VIEW COMPARISON:  06/11/2015. FINDINGS: Endotracheal tube, NG tube, right PICC line in stable position. Cardiomegaly with mild pulmonary vascular prominence interstitial prominence again noted. Small bilateral pleural effusions. Findings consistent mild congestive heart failure. Low lung volumes with bibasilar atelectasis and/or infiltrates. IMPRESSION: 1. Lines and tubes in stable position. 2. Persistent changes of congestive heart failure mild interstitial edema and bilateral effusions. 3. Low lung volumes with bibasilar atelectasis and/or infiltrates. These changes are progressive from prior exam . Electronically Signed   By: [place]   On: July 02, 2015 07:09   Dg Chest 1 View  06/24/2015  CLINICAL DATA:  Status post intubation. EXAM: CHEST 1 VIEW COMPARISON:  Earlier the same day FINDINGS: 1808 hours. Endotracheal tube tip is 3.2 cm above the base of the carina. The NG tube passes into the stomach although the distal tip position is not included on the film. Right PICC line tip overlies  the mid SVC level. Patient is rotated to the right. The cardio pericardial silhouette is enlarged. There is pulmonary vascular congestion without overt pulmonary edema. Interstitial pulmonary edema is suspected. Small bilateral pleural effusions are noted. Telemetry leads overlie the chest. IMPRESSION: Endotracheal tube tip is 3.2 cm above the base of the carina. Cardiomegaly with vascular congestion and interstitial pulmonary edema. Small bilateral pleural effusions. Electronically Signed   By: Misty Stanley M.D.   On: 06/26/2015 18:22   Dg Abd 1 View  07-28-2015  CLINICAL DATA:  Diarrhea EXAM: ABDOMEN - 1 VIEW COMPARISON:  06/13/2015 FINDINGS: There is normal small bowel gas pattern. Moderate gaseous distension of the stomach. NG tube in place with tip in body of the stomach unchanged in position. Bilateral small pleural effusion right greater than left. IMPRESSION: Normal small bowel gas pattern. Moderate gaseous distension of the stomach. Stable NG tube position with tip in body of the stomach. Electronically Signed   By: Lahoma Crocker M.D.   On: 28-Jul-2015 10:15   Dg Abd 1 View  06/12/2015  CLINICAL DATA:  Enteric tube placement. EXAM: ABDOMEN - 1 VIEW COMPARISON:  10/06/2014 abdominal radiograph. FINDINGS: Enteric tube terminates in the body of the stomach. Enteric tube side port is intragastric. Mild gaseous distention of the stomach. No appreciable dilated small bowel loops. No evidence of pneumatosis or pneumoperitoneum. Moderate degenerative changes in the visualized thoracolumbar spine. Vascular calcifications throughout the soft tissues. IMPRESSION: Enteric tube terminates in the body of the stomach. Electronically Signed   By: Ilona Sorrel M.D.   On: 06/10/2015 18:23   Dg Chest Port 1 View  06/21/2015  CLINICAL DATA:  Sepsis.  Code sepsis. EXAM: PORTABLE CHEST 1 VIEW COMPARISON:  05/29/2015 FINDINGS: PICC line tip in the lower SVC region. Patchy densities in the lower chest bilaterally. Heart size  is enlarged. Negative for pneumothorax. IMPRESSION: Slightly increased densities in the lower chest bilaterally. Findings probably represent a combination of atelectasis and pleural fluid. Electronically Signed   By: Markus Daft M.D.   On: 06/08/2015 10:15   Dg Chest Port 1 View  05/29/2015  CLINICAL DATA:  PICC line placement. Recently postop for metatarsal amputation. EXAM: PORTABLE CHEST 1 VIEW COMPARISON:  04/05/2015 FINDINGS: A new right arm PICC line is seen with tip overlying the inferior aspect of the right atrium, approximately 5 cm below the superior cavoatrial junction. No pneumothorax visualized Decreased left pleural effusion and left basilar atelectasis is demonstrated. Increased atelectasis seen in the right lung base. Heart size remains stable IMPRESSION: New right arm PICC line tip overlies the inferior aspect of the right atrium, approximately 5 cm below the superior cavoatrial junction. Decreased left pleural effusion and left lower lobe atelectasis. Increased Right basilar atelectasis. Electronically Signed   By: Earle Gell M.D.   On: 05/29/2015 12:49   Dg Foot Complete Right  06/23/2015  CLINICAL DATA:  Right foot amputation EXAM: RIGHT FOOT COMPLETE - 3+ VIEW COMPARISON:  01/29/2015 FINDINGS: Midfoot amputation at the Lisfranc joint. Small bone fragments at the osteotomy /disarticulation may be postoperative. No gross bony erosion. There is extensive soft tissue gas in the overlying soft tissue flap, which likely reaches the cuboid. Negative  for acute fracture. 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 When allowing for postoperative bone fragments there is no convincing osseous erosion. Electronically Signed   By: Monte Fantasia M.D.   On: 06/14/2015 10:21   Assessment/Plan 70 year old female admitted for sepsis. 1) Agree with podiatry, origin of sepsis not from right transmetatarsal amputation site.  2) When  stable would consider re-angio or BKA. 3) Care as per primary team. 4) Discussed with Dr. Francene Castle, PA-C  July 20, 2015 11:03 AM

## 2015-07-03 DEATH — deceased

## 2015-07-14 ENCOUNTER — Encounter: Payer: Self-pay | Admitting: Infectious Diseases

## 2015-07-17 LAB — CULTURE, BLOOD (ROUTINE X 2)

## 2015-08-03 NOTE — Discharge Summary (Signed)
  Date of admission 06/29/2015 Patient died on 07/14/2015 at 1902 hrs.  Final diagnoses   1. septic shock secondary to right foot osteomyelitis and Clostridium difficile colitis 2. Metabolic encephalopathy 3. Acute respiratory distress requiring mechanical intubation 4. Anemia of chronic disease 5. Acute on chronic kidney disease stage III 6. Severe neutropenia  [name] was 70 year old African-American female with multiple medical problems came into the emergency room with septic shock was admitted in the ICU intubated placed on mechanical ventilator for airway protection given respiratory distress in the setting of septic shock. Patient was started on broad-spectrum antibiotic. She she was on 3 pressors with phenylephrine and vasopressin and Neo-Synephrine. Patient continued on broad-spectrum antibiotics with meropenem and tobramycin and vancomycin. Given her multiple other comorbidities patient continued to decline. She carried a poor overall prognosis given multiorgan failure. She died on 07/14/2015 at 1902 hrs.

## 2015-11-11 IMAGING — CR DG TOE GREAT 2+V*R*
1 series · 3 of 3 positions shown · non-contrast
Comparison: Plain films of the right foot 11/30/2014.

CLINICAL DATA: Open wound at the base of the great toe for over a
month in a diabetic patient.

EXAM:
RIGHT GREAT TOE

[Series 1: dg toe great right · 0.14mm/px · 3 of 3 slices shown]
[im 1/3]
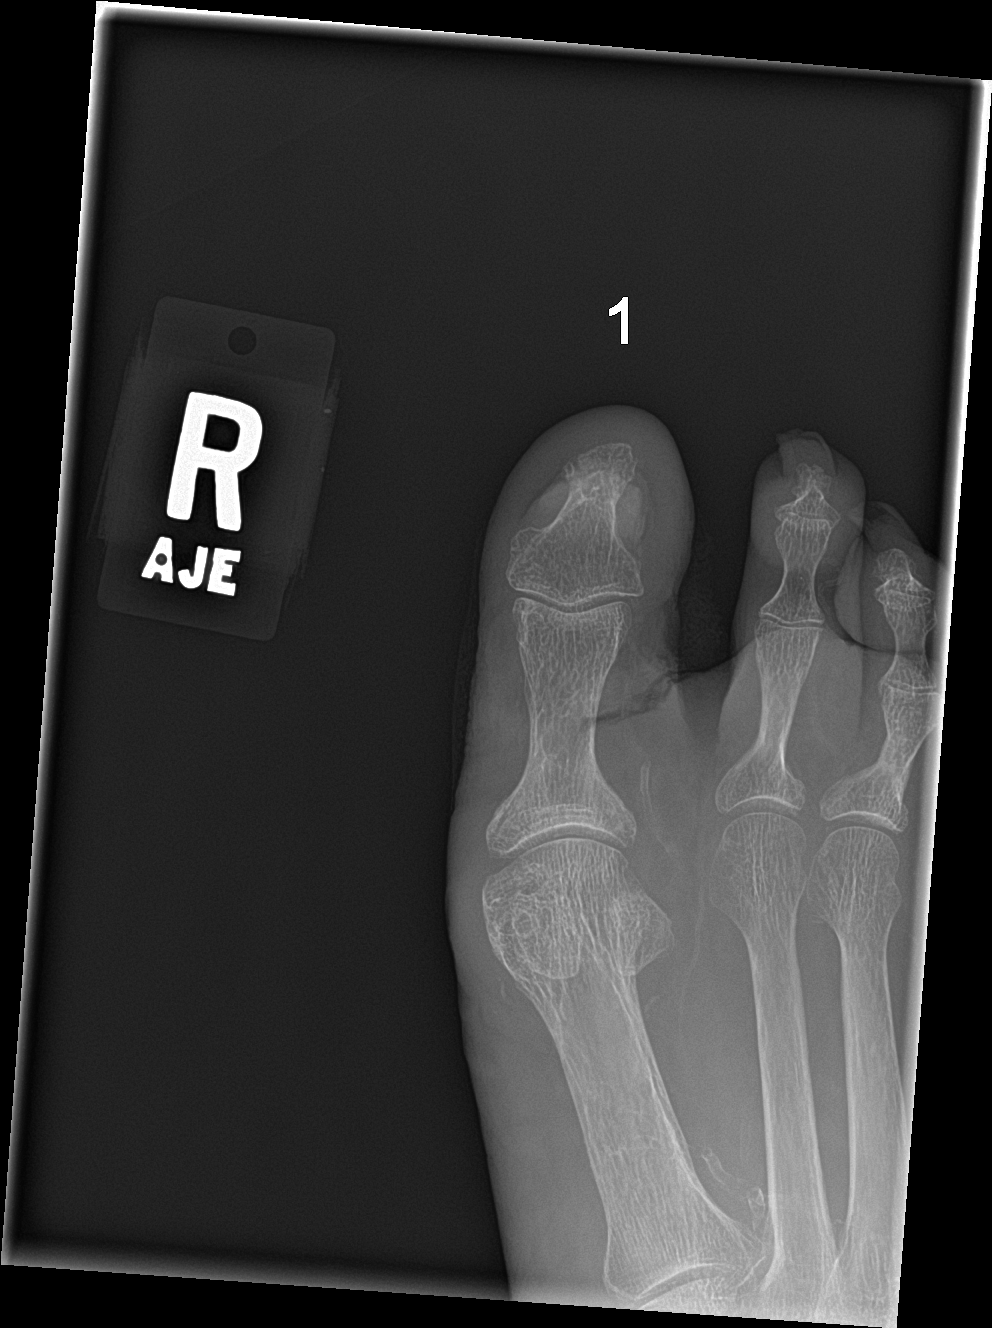
[im 2/3]
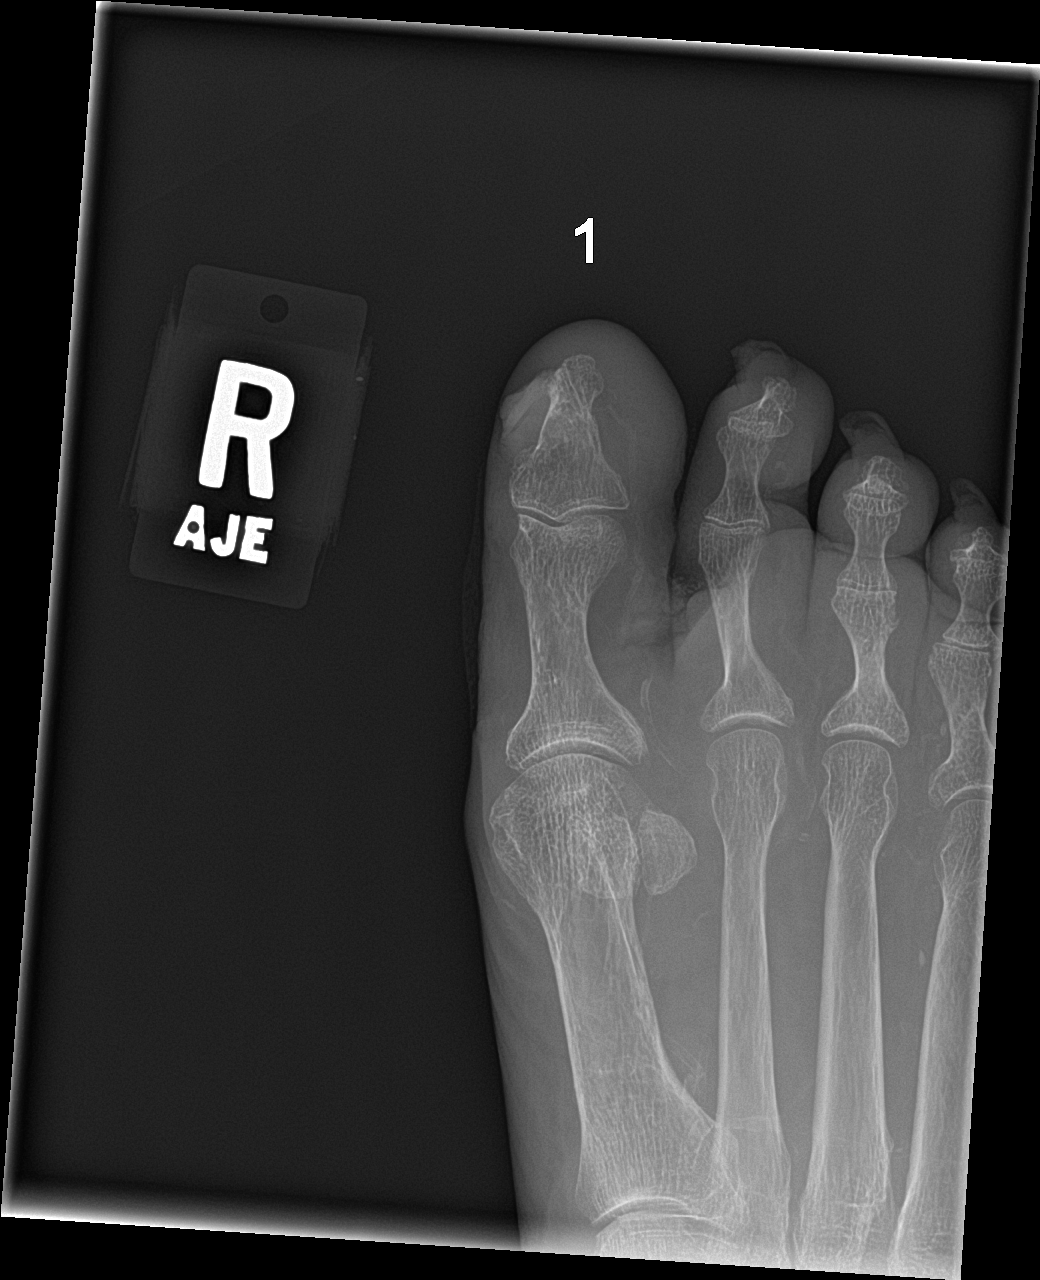
[im 3/3]
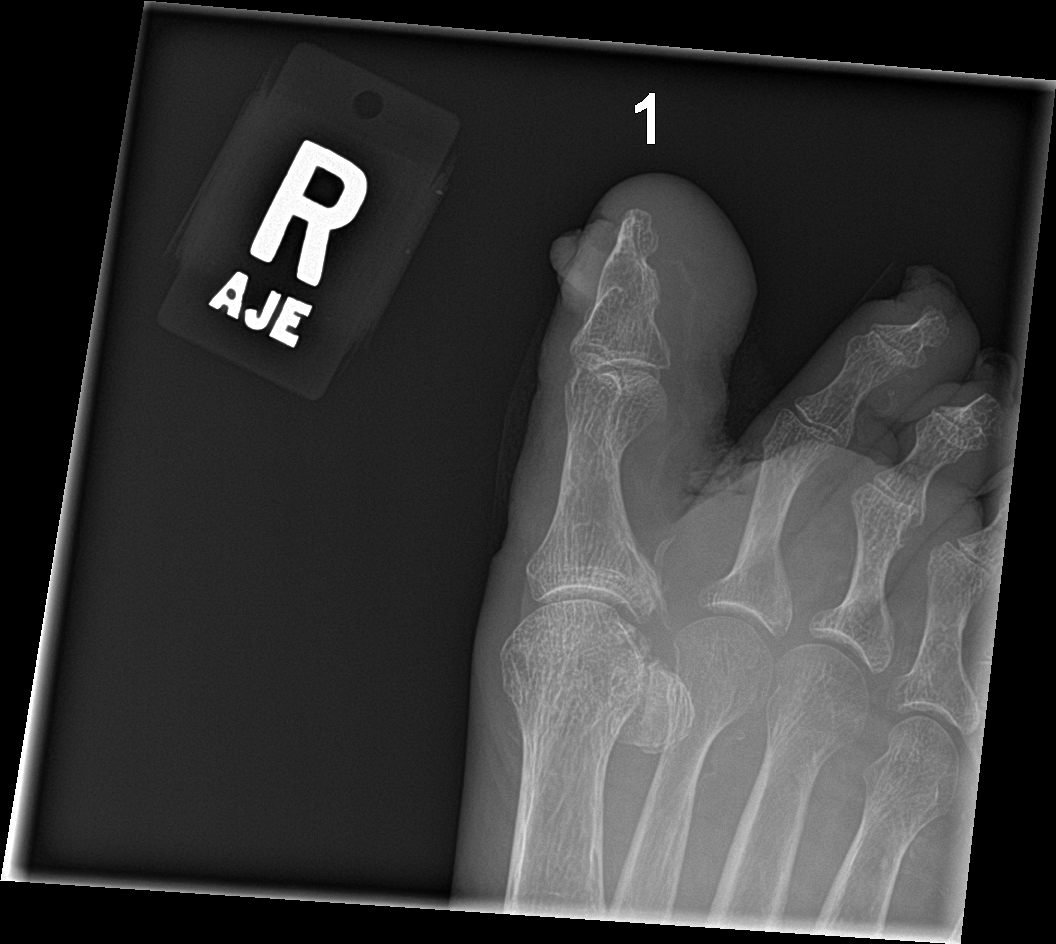

[3 of 3 positions shown; findings below may reference images not displayed]

FINDINGS: There is a new soft tissue wound with gas identified along the
lateral aspect of the great toe extending from the webspace to the
level of the mid aspect of the proximal phalanx of the great toe. No
radiopaque foreign body is identified. No periosteal reaction or
bony destructive change is seen. Atherosclerotic calcifications are
noted.
IMPRESSION: Soft tissue wound along the lateral aspect of the great toe without
underlying foreign body or evidence of osteomyelitis.

## 2016-02-12 IMAGING — US US EXTREM LOW VENOUS*R*
1 series · 13 of 24 positions shown · non-contrast
Comparison: MRI 01/31/2015.  ULTRASOUND 01/29/2015.

CLINICAL DATA: Right lower extremity swelling.



[Series 1: us extrem low venous*right* · 0.07mm/px · 13 of 42 slices shown]
[im 1/42]
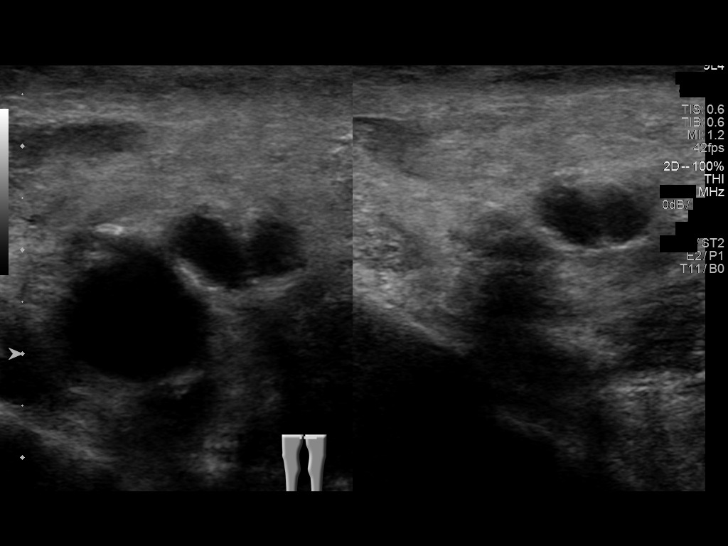
[im 4/42]
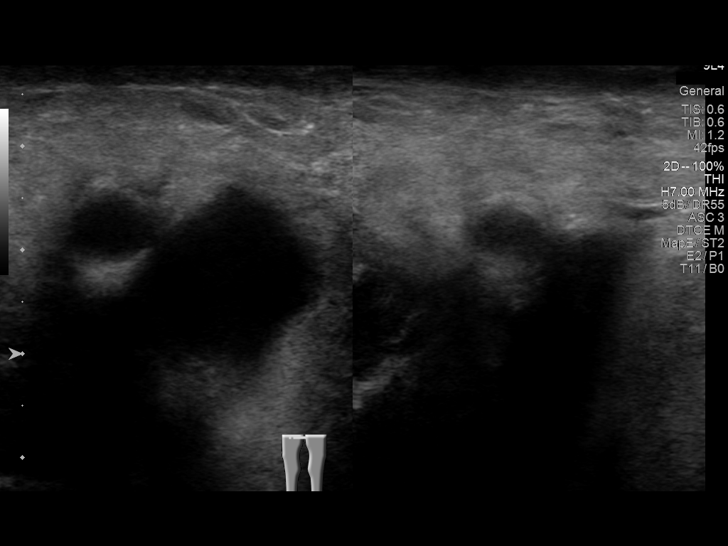
[im 8/42]
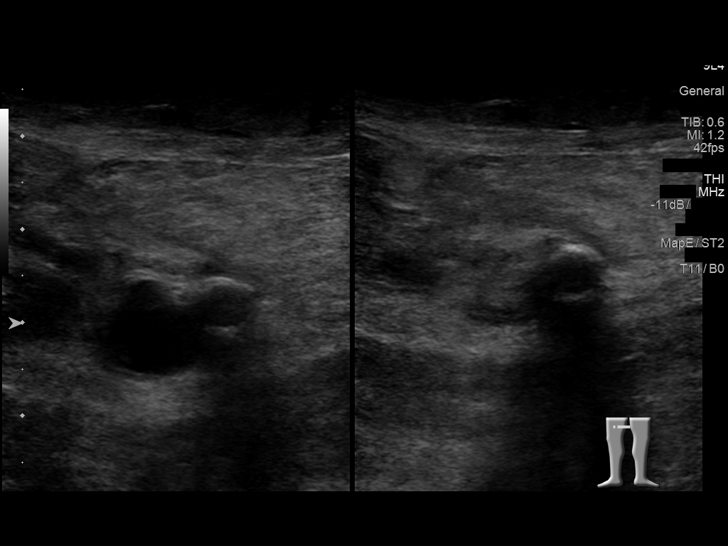
[im 11/42]
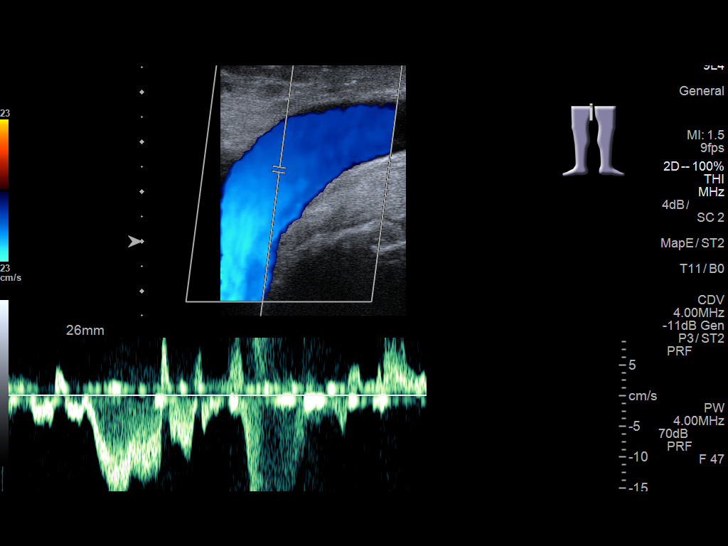
[im 15/42]
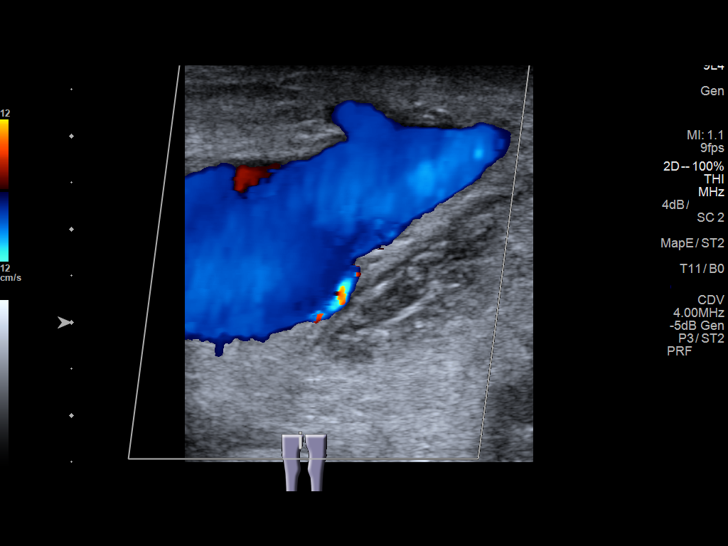
[im 18/42]
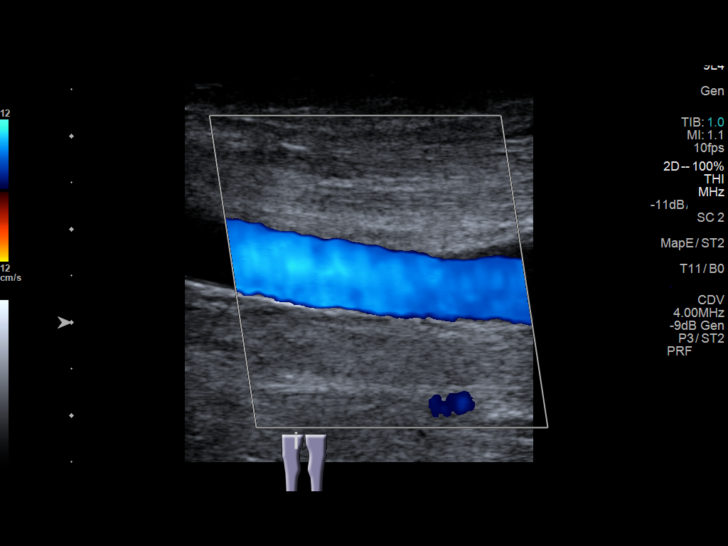
[im 22/42]
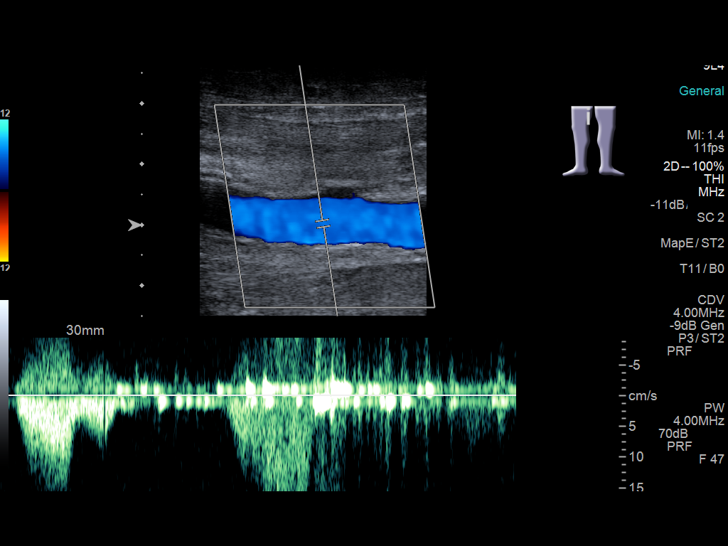
[im 24/42]
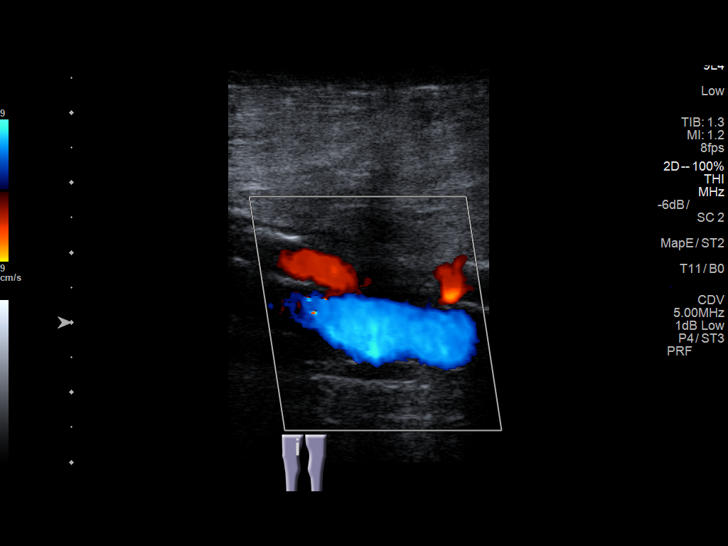
[im 27/42]
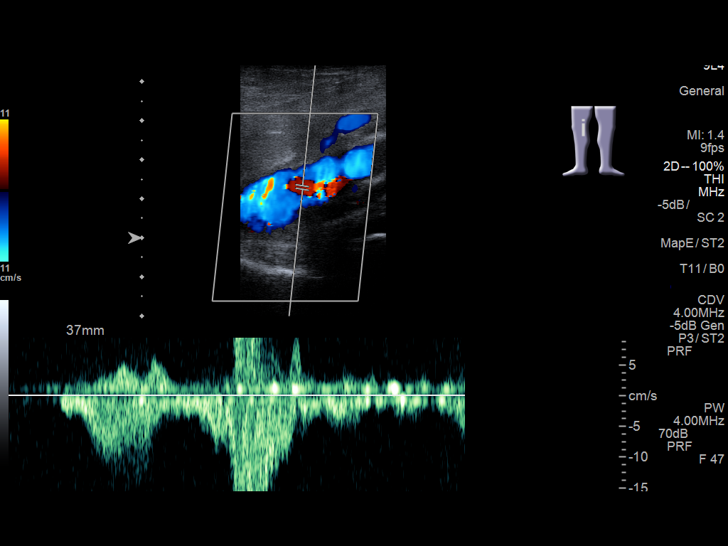
[im 31/42]
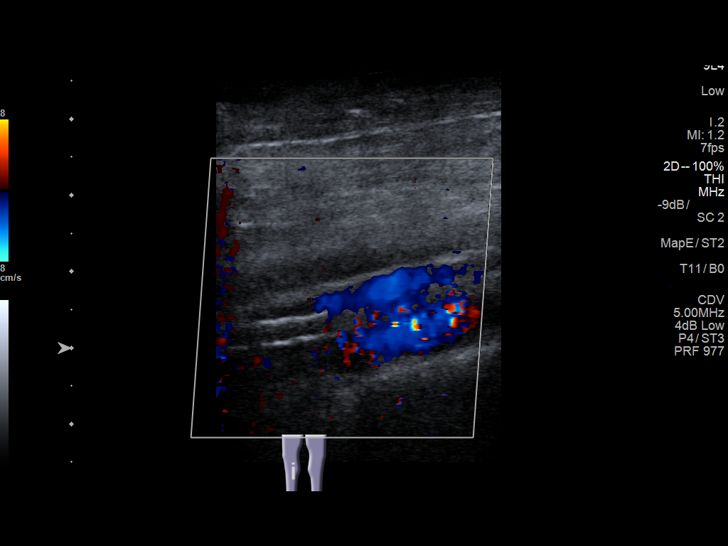
[im 34/42]
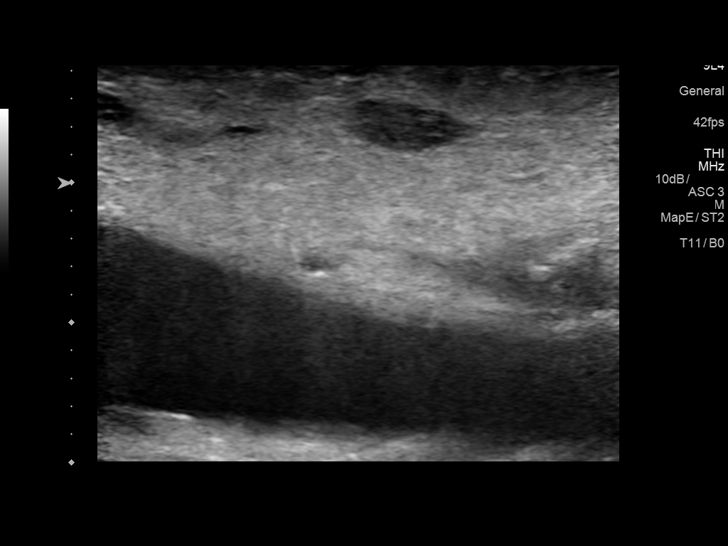
[im 38/42]
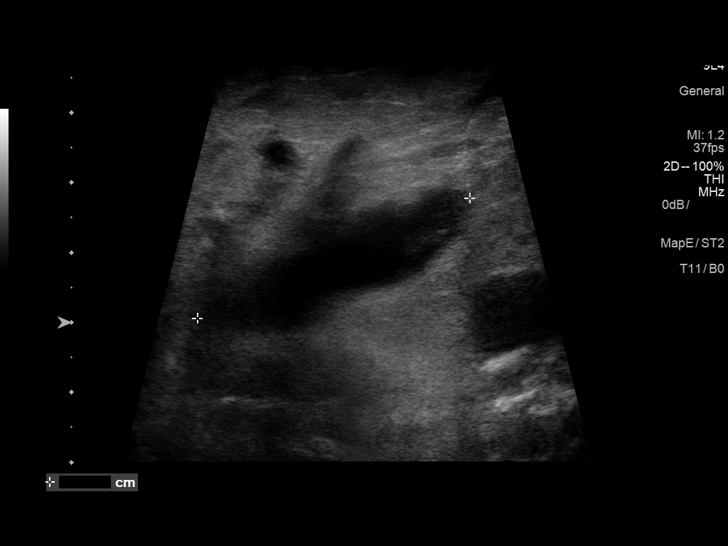
[im 42/42]
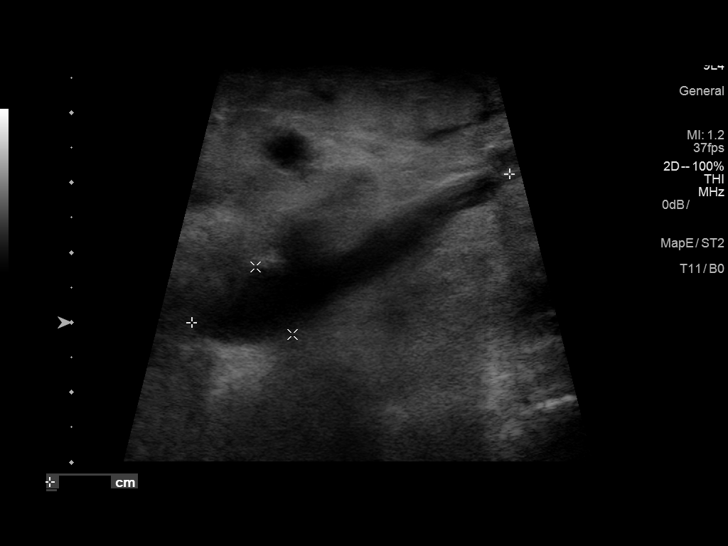

[13 of 24 positions shown; findings below may reference images not displayed]

FINDINGS: Contralateral Common Femoral Vein: Respiratory phasicity is normal
and symmetric with the symptomatic side. No evidence of thrombus.
Normal compressibility.

Common Femoral Vein: No evidence of thrombus. Normal
compressibility, respiratory phasicity and response to augmentation.

Saphenofemoral Junction: No evidence of thrombus. Normal
compressibility and flow on color Doppler imaging.

Profunda Femoral Vein: No evidence of thrombus. Normal
compressibility and flow on color Doppler imaging.

Femoral Vein: No evidence of thrombus. Normal compressibility,
respiratory phasicity and response to augmentation.

Popliteal Vein: No evidence of thrombus. Normal compressibility,
respiratory phasicity and response to augmentation.

Calf Veins: No evidence of thrombus. Normal compressibility and flow
on color Doppler imaging.

Superficial Great Saphenous Vein: No evidence of thrombus. Normal
compressibility and flow on color Doppler imaging.

Venous Reflux:  None.

Other Findings: 5.0 x 1.1 x 4.3 cm complex cystic fluid collection
in the right popliteal fossa consistent with Baker cyst. 2.8 x 0.4 x
0.9 cm density noted in the right inguinal region consistent with a
lymph node. Similar findings noted on prior exam.
IMPRESSION: No evidence of deep venous thrombosis.

## 2016-04-26 IMAGING — CR DG FOOT COMPLETE 3+V*R*
1 series · 3 of 3 positions shown · non-contrast
Comparison: 01/29/2015

CLINICAL DATA: Right foot amputation

EXAM:
RIGHT FOOT COMPLETE - 3+ VIEW

[Series 1: ap · 0.17mm/px · 3 of 3 slices shown]
[im 1/3]
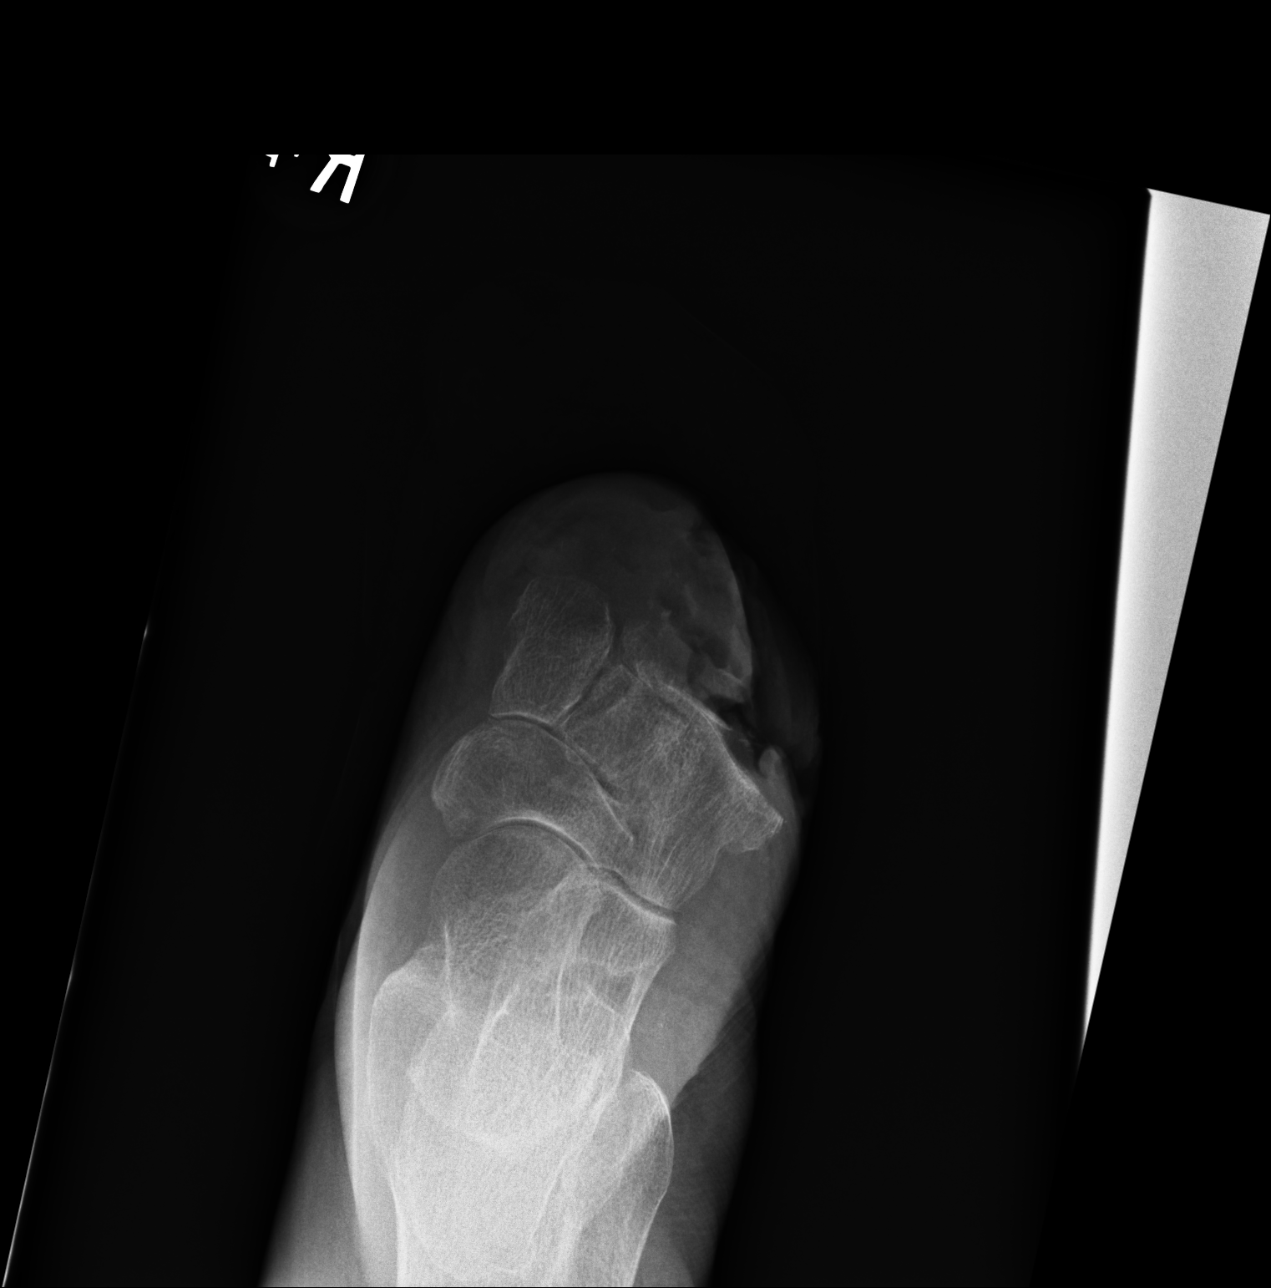
[im 2/3]
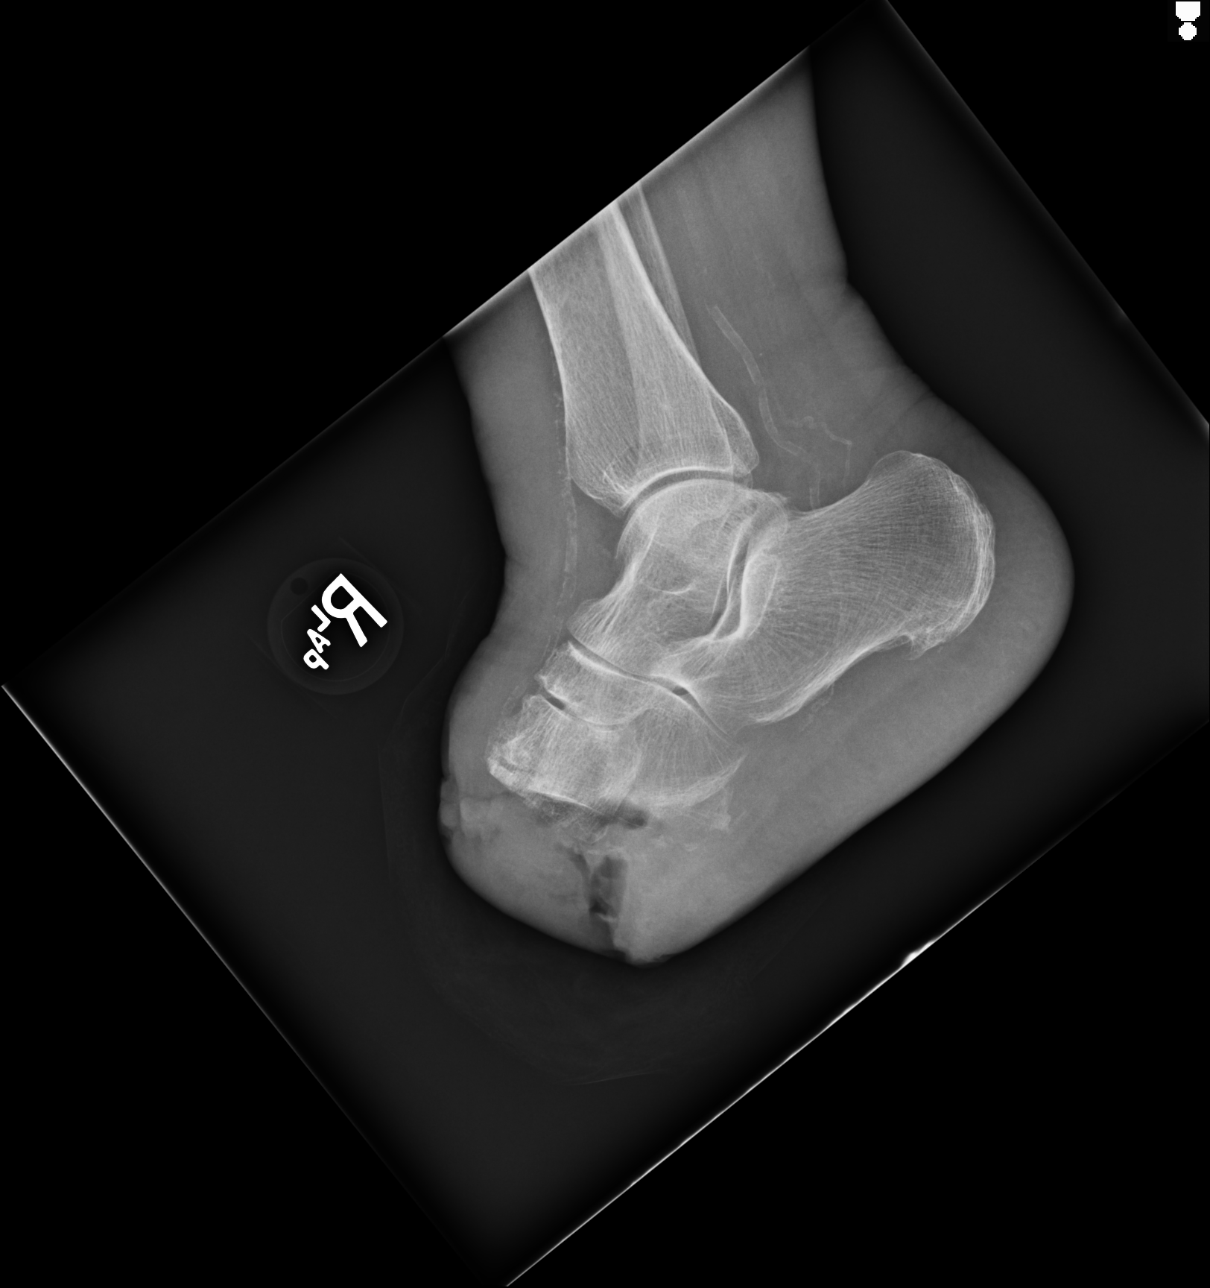
[im 3/3]
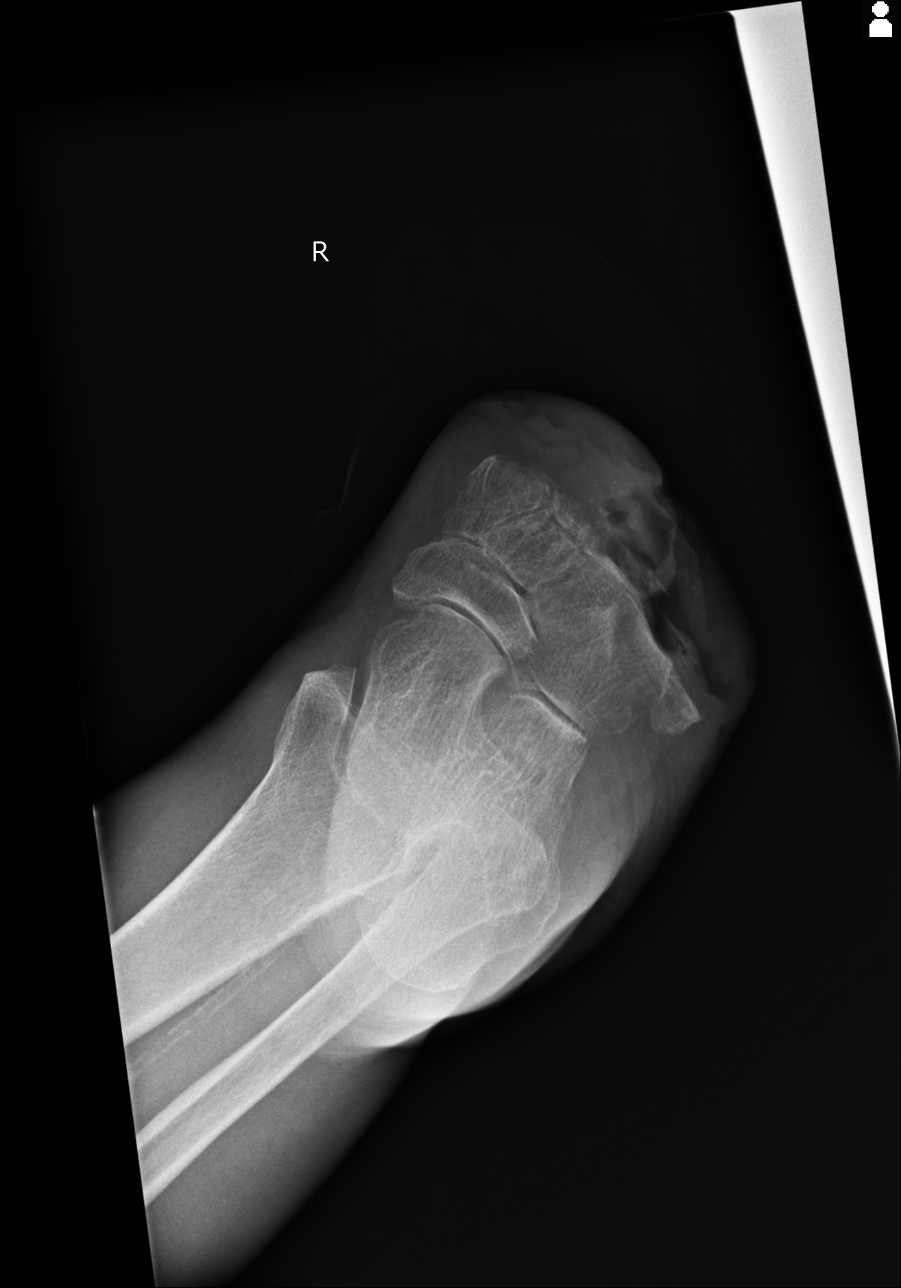

[3 of 3 positions shown; findings below may reference images not displayed]

FINDINGS: Midfoot amputation at the Lisfranc joint. Small bone fragments at
the osteotomy /disarticulation may be postoperative. No gross bony
erosion. There is extensive soft tissue gas in the overlying soft
tissue flap, which likely reaches the cuboid. Negative for acute
fracture.
IMPRESSION: Lisfranc amputation with prominent amount of soft tissue gas,
reaching bone at the lateral midfoot. When allowing for
postoperative bone fragments there is no convincing osseous erosion.

## 2016-04-26 IMAGING — DX DG CHEST 1V
1 series · 1 of 1 positions shown · non-contrast
Comparison: Earlier the same day

CLINICAL DATA: Status post intubation.

EXAM:
CHEST 1 VIEW

[chest ap]
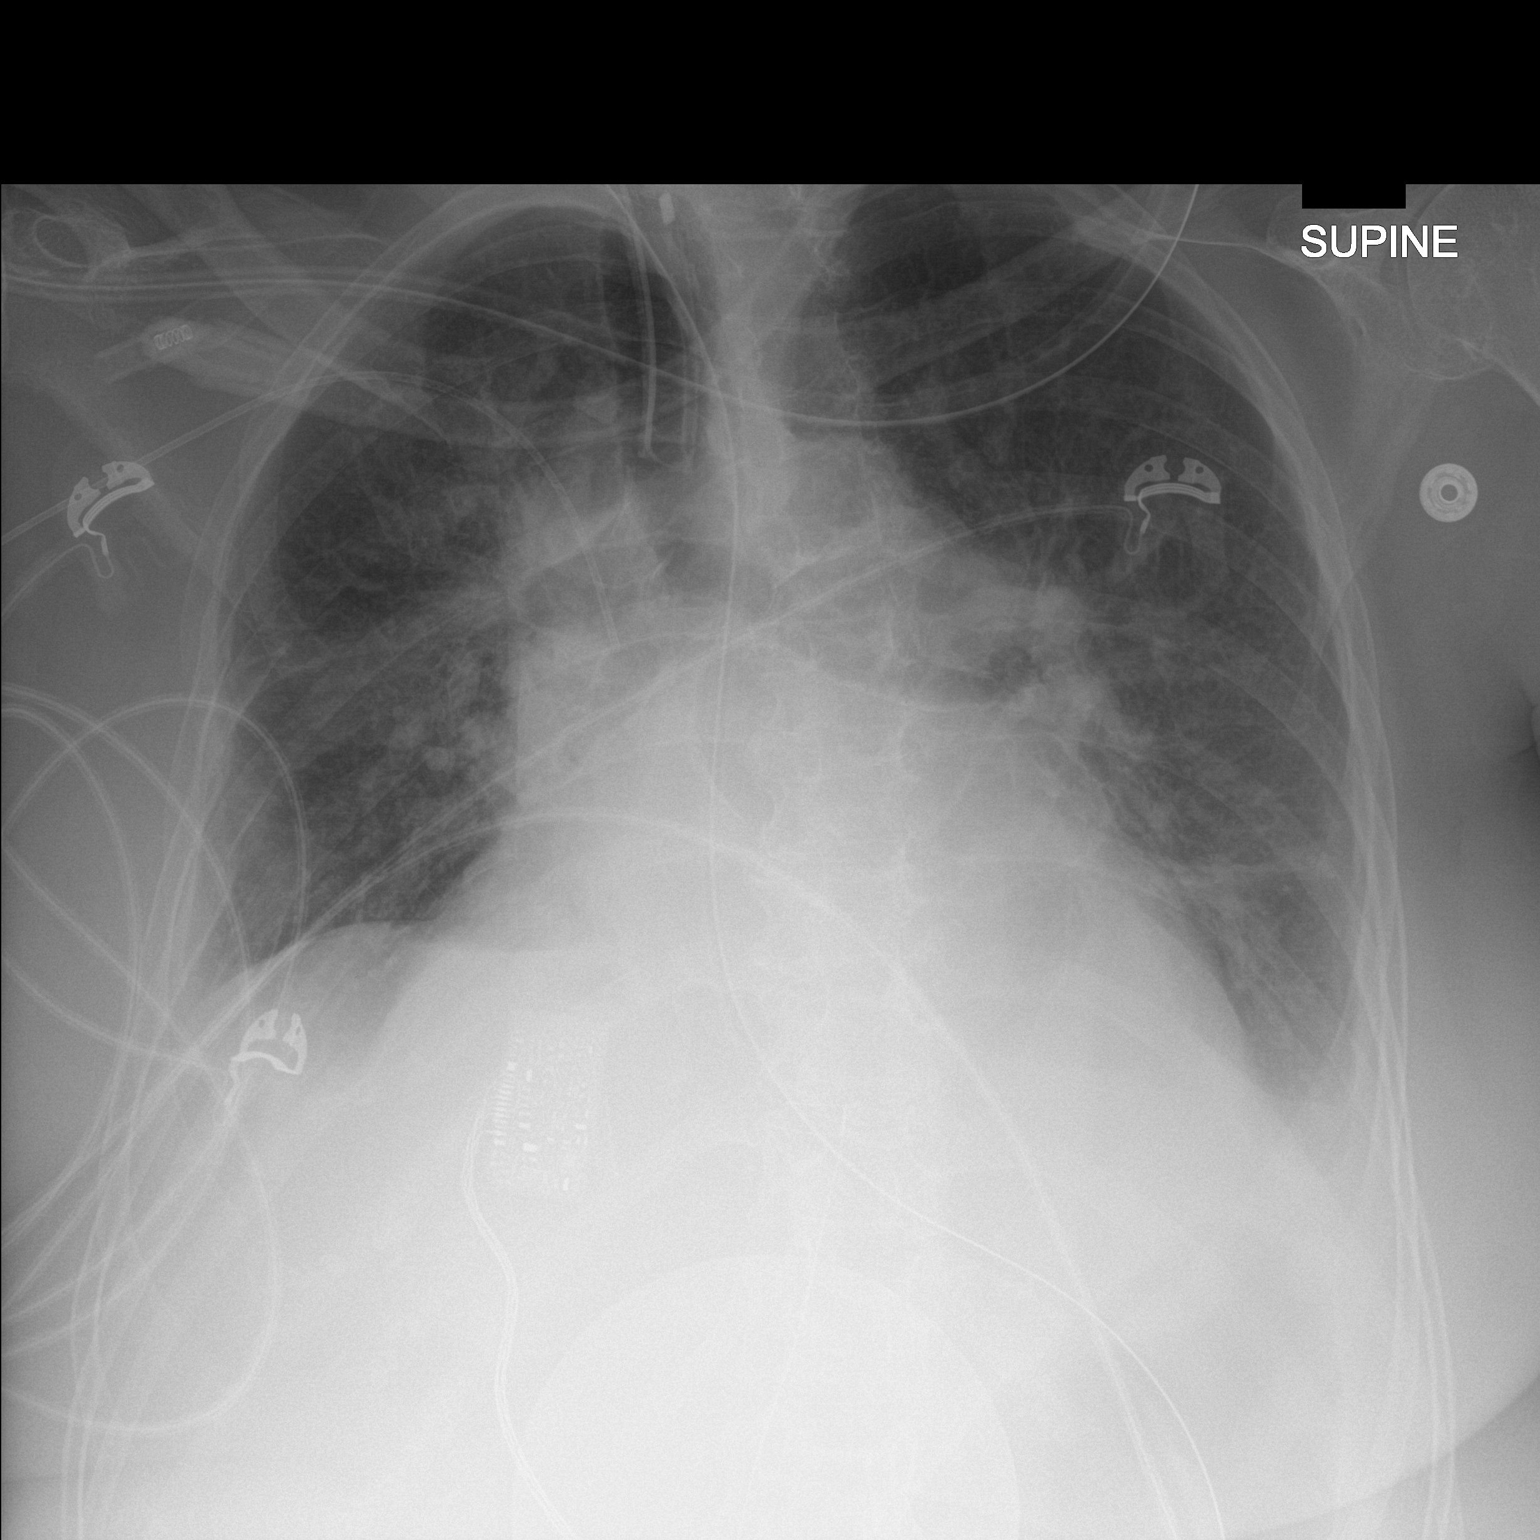

[1 of 1 positions shown; findings below may reference images not displayed]

FINDINGS: 4252 hours. Endotracheal tube tip is 3.2 cm above the base of the
carina. The NG tube passes into the stomach although the distal tip
position is not included on the film. Right PICC line tip overlies
the mid SVC level. Patient is rotated to the right. The cardio
pericardial silhouette is enlarged. There is pulmonary vascular
congestion without overt pulmonary edema. Interstitial pulmonary
edema is suspected. Small bilateral pleural effusions are noted.
Telemetry leads overlie the chest.
IMPRESSION: Endotracheal tube tip is 3.2 cm above the base of the carina.

Cardiomegaly with vascular congestion and interstitial pulmonary
edema.

Small bilateral pleural effusions.
